# Patient Record
Sex: Male | Born: 1945 | Race: White | Hispanic: No | Marital: Married | Smoking: Never smoker
Health system: Southern US, Community
[De-identification: ages and names within clinical notes are randomized; demographics above are authoritative.]

## PROBLEM LIST (undated history)

## (undated) DIAGNOSIS — M1990 Unspecified osteoarthritis, unspecified site: Secondary | ICD-10-CM

## (undated) DIAGNOSIS — M545 Low back pain: Secondary | ICD-10-CM

## (undated) DIAGNOSIS — J309 Allergic rhinitis, unspecified: Secondary | ICD-10-CM

## (undated) DIAGNOSIS — E785 Hyperlipidemia, unspecified: Secondary | ICD-10-CM

## (undated) DIAGNOSIS — I509 Heart failure, unspecified: Secondary | ICD-10-CM

## (undated) DIAGNOSIS — G4700 Insomnia, unspecified: Secondary | ICD-10-CM

## (undated) DIAGNOSIS — H6980 Other specified disorders of Eustachian tube, unspecified ear: Secondary | ICD-10-CM

## (undated) DIAGNOSIS — I10 Essential (primary) hypertension: Secondary | ICD-10-CM

## (undated) DIAGNOSIS — K279 Peptic ulcer, site unspecified, unspecified as acute or chronic, without hemorrhage or perforation: Secondary | ICD-10-CM

## (undated) DIAGNOSIS — I498 Other specified cardiac arrhythmias: Secondary | ICD-10-CM

## (undated) DIAGNOSIS — K219 Gastro-esophageal reflux disease without esophagitis: Secondary | ICD-10-CM

## (undated) DIAGNOSIS — K5730 Diverticulosis of large intestine without perforation or abscess without bleeding: Secondary | ICD-10-CM

## (undated) DIAGNOSIS — I729 Aneurysm of unspecified site: Secondary | ICD-10-CM

## (undated) HISTORY — DX: Hyperlipidemia, unspecified: E78.5

## (undated) HISTORY — DX: Low back pain: M54.5

## (undated) HISTORY — DX: Other specified cardiac arrhythmias: I49.8

## (undated) HISTORY — DX: Gastro-esophageal reflux disease without esophagitis: K21.9

## (undated) HISTORY — DX: Diverticulosis of large intestine without perforation or abscess without bleeding: K57.30

## (undated) HISTORY — DX: Aneurysm of unspecified site: I72.9

## (undated) HISTORY — DX: Allergic rhinitis, unspecified: J30.9

## (undated) HISTORY — DX: Essential (primary) hypertension: I10

## (undated) HISTORY — DX: Peptic ulcer, site unspecified, unspecified as acute or chronic, without hemorrhage or perforation: K27.9

## (undated) HISTORY — DX: Other specified disorders of Eustachian tube, unspecified ear: H69.80

## (undated) HISTORY — DX: Insomnia, unspecified: G47.00

---

## 1980-01-27 HISTORY — PX: APPENDECTOMY: SHX54

## 1990-01-26 HISTORY — PX: OTHER SURGICAL HISTORY: SHX169

## 2002-06-18 ENCOUNTER — Emergency Department (HOSPITAL_COMMUNITY): Admission: EM | Admit: 2002-06-18 | Discharge: 2002-06-18 | Payer: Self-pay | Admitting: Emergency Medicine

## 2002-06-30 ENCOUNTER — Emergency Department (HOSPITAL_COMMUNITY): Admission: EM | Admit: 2002-06-30 | Discharge: 2002-06-30 | Payer: Self-pay | Admitting: Emergency Medicine

## 2005-12-25 ENCOUNTER — Ambulatory Visit (HOSPITAL_COMMUNITY): Admission: RE | Admit: 2005-12-25 | Discharge: 2005-12-26 | Payer: Self-pay | Admitting: Neurosurgery

## 2005-12-26 HISTORY — PX: LUMBAR DISC SURGERY: SHX700

## 2007-02-02 ENCOUNTER — Ambulatory Visit: Payer: Self-pay | Admitting: Pulmonary Disease

## 2007-02-02 DIAGNOSIS — I10 Essential (primary) hypertension: Secondary | ICD-10-CM

## 2007-02-02 DIAGNOSIS — G4700 Insomnia, unspecified: Secondary | ICD-10-CM | POA: Insufficient documentation

## 2007-02-02 DIAGNOSIS — E785 Hyperlipidemia, unspecified: Secondary | ICD-10-CM | POA: Insufficient documentation

## 2007-02-02 DIAGNOSIS — I729 Aneurysm of unspecified site: Secondary | ICD-10-CM

## 2007-02-02 HISTORY — DX: Insomnia, unspecified: G47.00

## 2007-02-02 HISTORY — DX: Essential (primary) hypertension: I10

## 2007-02-02 HISTORY — DX: Aneurysm of unspecified site: I72.9

## 2007-02-02 HISTORY — DX: Hyperlipidemia, unspecified: E78.5

## 2007-02-23 ENCOUNTER — Ambulatory Visit: Payer: Self-pay | Admitting: Pulmonary Disease

## 2007-07-05 ENCOUNTER — Ambulatory Visit: Payer: Self-pay | Admitting: Internal Medicine

## 2007-07-05 DIAGNOSIS — K5730 Diverticulosis of large intestine without perforation or abscess without bleeding: Secondary | ICD-10-CM | POA: Insufficient documentation

## 2007-07-05 DIAGNOSIS — K219 Gastro-esophageal reflux disease without esophagitis: Secondary | ICD-10-CM

## 2007-07-05 HISTORY — DX: Diverticulosis of large intestine without perforation or abscess without bleeding: K57.30

## 2007-07-05 HISTORY — DX: Gastro-esophageal reflux disease without esophagitis: K21.9

## 2007-07-07 ENCOUNTER — Encounter: Payer: Self-pay | Admitting: Internal Medicine

## 2007-10-04 ENCOUNTER — Telehealth (INDEPENDENT_AMBULATORY_CARE_PROVIDER_SITE_OTHER): Payer: Self-pay | Admitting: *Deleted

## 2007-10-06 ENCOUNTER — Ambulatory Visit: Payer: Self-pay | Admitting: Internal Medicine

## 2007-10-06 DIAGNOSIS — R001 Bradycardia, unspecified: Secondary | ICD-10-CM | POA: Insufficient documentation

## 2007-10-06 DIAGNOSIS — H6693 Otitis media, unspecified, bilateral: Secondary | ICD-10-CM | POA: Insufficient documentation

## 2007-10-06 DIAGNOSIS — I498 Other specified cardiac arrhythmias: Secondary | ICD-10-CM

## 2007-10-06 HISTORY — DX: Other specified cardiac arrhythmias: I49.8

## 2007-10-06 LAB — CONVERTED CEMR LAB
ALT: 37 units/L (ref 0–53)
AST: 31 units/L (ref 0–37)
Albumin: 4.1 g/dL (ref 3.5–5.2)
Alkaline Phosphatase: 89 units/L (ref 39–117)
Cholesterol: 188 mg/dL (ref 0–200)
HDL: 34.3 mg/dL — ABNORMAL LOW (ref >39.0)
Total CHOL/HDL Ratio: 5.5
Total Protein: 7.3 g/dL (ref 6.0–8.3)
Triglycerides: 110 mg/dL (ref 0–149)

## 2008-01-11 ENCOUNTER — Ambulatory Visit: Payer: Self-pay | Admitting: Internal Medicine

## 2008-01-11 DIAGNOSIS — J309 Allergic rhinitis, unspecified: Secondary | ICD-10-CM

## 2008-01-11 DIAGNOSIS — J019 Acute sinusitis, unspecified: Secondary | ICD-10-CM | POA: Insufficient documentation

## 2008-01-11 DIAGNOSIS — M545 Low back pain, unspecified: Secondary | ICD-10-CM

## 2008-01-11 HISTORY — DX: Low back pain, unspecified: M54.50

## 2008-01-11 HISTORY — DX: Allergic rhinitis, unspecified: J30.9

## 2008-07-06 ENCOUNTER — Ambulatory Visit: Payer: Self-pay | Admitting: Internal Medicine

## 2008-07-06 LAB — CONVERTED CEMR LAB
ALT: 32 units/L (ref 0–53)
AST: 31 units/L (ref 0–37)
Albumin: 4 g/dL (ref 3.5–5.2)
Basophils Absolute: 0 10*3/uL (ref 0.0–0.1)
Bilirubin Urine: NEGATIVE
Calcium: 9.4 mg/dL (ref 8.4–10.5)
Cholesterol: 179 mg/dL (ref 0–200)
Creatinine, Ser: 1.2 mg/dL (ref 0.4–1.5)
GFR calc non Af Amer: 65.14 mL/min (ref >60)
HCT: 44.4 % (ref 39.0–52.0)
HDL: 46.4 mg/dL (ref >39.00)
Hemoglobin: 15.2 g/dL (ref 13.0–17.0)
Ketones, ur: NEGATIVE mg/dL
Leukocytes, UA: NEGATIVE
Lymphs Abs: 1.6 10*3/uL (ref 0.7–4.0)
MCV: 88.7 fL (ref 78.0–100.0)
Monocytes Absolute: 0.7 10*3/uL (ref 0.1–1.0)
Neutro Abs: 3.5 10*3/uL (ref 1.4–7.7)
Nitrite: NEGATIVE
Platelets: 197 10*3/uL (ref 150.0–400.0)
RDW: 13.1 % (ref 11.5–14.6)
Sodium: 143 meq/L (ref 135–145)
TSH: 3.87 microintl units/mL (ref 0.35–5.50)
Total Bilirubin: 0.9 mg/dL (ref 0.3–1.2)
Triglycerides: 49 mg/dL (ref 0.0–149.0)
Urobilinogen, UA: 0.2 (ref 0.0–1.0)
pH: 5.5 (ref 5.0–8.0)

## 2008-07-09 ENCOUNTER — Ambulatory Visit: Payer: Self-pay | Admitting: Internal Medicine

## 2008-07-23 ENCOUNTER — Telehealth: Payer: Self-pay | Admitting: Internal Medicine

## 2008-07-24 ENCOUNTER — Ambulatory Visit: Payer: Self-pay | Admitting: Internal Medicine

## 2008-09-08 ENCOUNTER — Ambulatory Visit: Payer: Self-pay | Admitting: Family Medicine

## 2008-10-15 ENCOUNTER — Ambulatory Visit: Payer: Self-pay | Admitting: Internal Medicine

## 2008-11-28 ENCOUNTER — Ambulatory Visit: Payer: Self-pay | Admitting: Internal Medicine

## 2008-11-28 DIAGNOSIS — R109 Unspecified abdominal pain: Secondary | ICD-10-CM | POA: Insufficient documentation

## 2008-11-28 LAB — CONVERTED CEMR LAB
Bilirubin Urine: NEGATIVE
Ketones, ur: NEGATIVE mg/dL
Total Protein, Urine: NEGATIVE mg/dL
Urine Glucose: NEGATIVE mg/dL
Urobilinogen, UA: 0.2 (ref 0.0–1.0)

## 2008-11-30 ENCOUNTER — Telehealth: Payer: Self-pay | Admitting: Internal Medicine

## 2008-12-26 ENCOUNTER — Ambulatory Visit: Payer: Self-pay | Admitting: Internal Medicine

## 2008-12-26 LAB — CONVERTED CEMR LAB
AST: 27 units/L (ref 0–37)
Albumin: 3.9 g/dL (ref 3.5–5.2)
HDL: 45 mg/dL (ref >39.00)
LDL Cholesterol: 117 mg/dL — ABNORMAL HIGH (ref 0–99)
Total Bilirubin: 0.8 mg/dL (ref 0.3–1.2)
Total CHOL/HDL Ratio: 4
Triglycerides: 137 mg/dL (ref 0.0–149.0)
VLDL: 27.4 mg/dL (ref 0.0–40.0)

## 2009-01-24 ENCOUNTER — Ambulatory Visit: Payer: Self-pay | Admitting: Internal Medicine

## 2009-03-27 ENCOUNTER — Ambulatory Visit: Payer: Self-pay | Admitting: Internal Medicine

## 2009-03-27 DIAGNOSIS — M542 Cervicalgia: Secondary | ICD-10-CM | POA: Insufficient documentation

## 2009-04-06 ENCOUNTER — Ambulatory Visit: Payer: Self-pay | Admitting: Family Medicine

## 2009-04-06 DIAGNOSIS — H6980 Other specified disorders of Eustachian tube, unspecified ear: Secondary | ICD-10-CM | POA: Insufficient documentation

## 2009-04-06 DIAGNOSIS — H6990 Unspecified Eustachian tube disorder, unspecified ear: Secondary | ICD-10-CM

## 2009-04-06 DIAGNOSIS — H6981 Other specified disorders of Eustachian tube, right ear: Secondary | ICD-10-CM | POA: Insufficient documentation

## 2009-04-06 HISTORY — DX: Unspecified eustachian tube disorder, unspecified ear: H69.90

## 2009-04-06 HISTORY — DX: Other specified disorders of Eustachian tube, unspecified ear: H69.80

## 2009-04-08 ENCOUNTER — Telehealth: Payer: Self-pay | Admitting: Internal Medicine

## 2009-07-18 ENCOUNTER — Ambulatory Visit: Payer: Self-pay | Admitting: Internal Medicine

## 2009-07-18 LAB — CONVERTED CEMR LAB
ALT: 28 units/L (ref 0–53)
Basophils Absolute: 0 10*3/uL (ref 0.0–0.1)
Basophils Relative: 0.7 % (ref 0.0–3.0)
Bilirubin Urine: NEGATIVE
Bilirubin, Direct: 0.1 mg/dL (ref 0.0–0.3)
CO2: 30 meq/L (ref 19–32)
Calcium: 10 mg/dL (ref 8.4–10.5)
Chloride: 105 meq/L (ref 96–112)
Eosinophils Absolute: 0.1 10*3/uL (ref 0.0–0.7)
Glucose, Bld: 84 mg/dL (ref 70–99)
HCT: 45.7 % (ref 39.0–52.0)
Hemoglobin: 15.8 g/dL (ref 13.0–17.0)
Ketones, ur: NEGATIVE mg/dL
LDL Cholesterol: 105 mg/dL — ABNORMAL HIGH (ref 0–99)
Lymphocytes Relative: 30.9 % (ref 12.0–46.0)
Lymphs Abs: 2.2 10*3/uL (ref 0.7–4.0)
MCHC: 34.7 g/dL (ref 30.0–36.0)
Neutro Abs: 3.8 10*3/uL (ref 1.4–7.7)
Potassium: 4.9 meq/L (ref 3.5–5.1)
RBC: 5.2 M/uL (ref 4.22–5.81)
Sodium: 142 meq/L (ref 135–145)
Total Bilirubin: 0.9 mg/dL (ref 0.3–1.2)
Total CHOL/HDL Ratio: 4
Total Protein, Urine: NEGATIVE mg/dL
Triglycerides: 96 mg/dL (ref 0.0–149.0)
Urine Glucose: NEGATIVE mg/dL
VLDL: 19.2 mg/dL (ref 0.0–40.0)
pH: 6 (ref 5.0–8.0)

## 2009-09-20 ENCOUNTER — Ambulatory Visit: Payer: Self-pay | Admitting: Internal Medicine

## 2010-02-23 LAB — CONVERTED CEMR LAB
AST: 27 units/L (ref 0–37)
Basophils Absolute: 0.1 10*3/uL (ref 0.0–0.1)
Basophils Relative: 1 % (ref 0.0–1.0)
Chloride: 106 meq/L (ref 96–112)
Cholesterol: 194 mg/dL (ref 0–200)
Creatinine, Ser: 1.1 mg/dL (ref 0.4–1.5)
Eosinophils Absolute: 0.1 10*3/uL (ref 0.0–0.7)
GFR calc Af Amer: 88 mL/min
GFR calc non Af Amer: 72 mL/min
HDL: 40 mg/dL (ref >39.0)
Ketones, ur: NEGATIVE mg/dL
LDL Cholesterol: 134 mg/dL — ABNORMAL HIGH (ref 0–99)
MCHC: 34.6 g/dL (ref 30.0–36.0)
MCV: 87.4 fL (ref 78.0–100.0)
Monocytes Absolute: 0.7 10*3/uL (ref 0.1–1.0)
Neutrophils Relative %: 49.9 % (ref 43.0–77.0)
PSA: 1 ng/mL (ref 0.10–4.00)
Platelets: 199 10*3/uL (ref 150–400)
RBC: 5.17 M/uL (ref 4.22–5.81)
TSH: 2.98 microintl units/mL (ref 0.35–5.50)
Total Bilirubin: 0.7 mg/dL (ref 0.3–1.2)
Triglycerides: 99 mg/dL (ref 0–149)
Urine Glucose: NEGATIVE mg/dL
Urobilinogen, UA: 0.2 (ref 0.0–1.0)
VLDL: 20 mg/dL (ref 0–40)

## 2010-02-25 NOTE — Assessment & Plan Note (Signed)
Summary: RIGHT EAR PAIN X 1 WK...AS.   Vital Signs:  Patient profile:   65 year old male Weight:      198 pounds Temp:     97.8 degrees F oral BP sitting:   102 / 76  (left arm)  Vitals Entered By: Doristine Devoid (April 06, 2009 10:48 AM) CC: R ear pain   History of Present Illness: R ear and throat hurt- rad to neck  going on about a week some sweats at night and chills  some nasal running and stuffiness- no colored d/c  no cough   no rash  no stomach upset   other ear feels fine   no meds over the counter   some facial pain over R eye   Allergies: 1)  ! Codeine  Past History:  Past Medical History: Last updated: 01/11/2008 Hyperlipidemia Hypertension GERD Diverticulosis, colon chronic bradycardia chronic right foot drop Low back pain/lumbar disc disease DJD hx of sciatica Allergic rhinitis  Past Surgical History: Last updated: 01/11/2008 appendectomy 1982 s/p SAH left brain 1992 - [facility] - due to brain aneurysm s/p repair s/p lumbar disc surgury dr Venetia Maxon - 12/07 s/p renal surgury for football injury 1966 Inguinal herniorrhaphy - right  Family History: Last updated: 01/11/2008 heart disease: father, HTN 3 brother with HTN 1 sister died with heart disease  Social History: Last updated: 07/05/2007 Patient never smoked.  pt is married. pt has children. 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 Eyes:  Complains of eye irritation. ENT:  Complains of earache, nasal congestion, postnasal drainage, and sore throat; denies ear discharge. CV:  Denies chest pain or discomfort and palpitations. Resp:  Complains of cough; denies sputum productive and wheezing. GI:  Denies diarrhea, nausea, and vomiting. Derm:  Denies rash.  Physical Exam  General:   Well-developed,well-nourished,in no acute distress; alert,appropriate and cooperative throughout examination Head:  normocephalic, atraumatic, and no abnormalities observed.  no sinus or temporal tenderness  Eyes:  vision grossly intact, pupils equal, pupils round, and pupils reactive to light.   Ears:  dull TMs , with eff on the R side  Nose:  nares are injected and congested bilaterally  Mouth:  throat clear with some clear post nasal drip Neck:  No deformities, masses, or tenderness noted. Lungs:  Normal respiratory effort, chest expands symmetrically. Lungs are clear to auscultation, no crackles or wheezes. Heart:  normal rate and regular rhythm.   Skin:  Intact without suspicious lesions or rashes Cervical Nodes:  No lymphadenopathy noted Psych:  normal affect, talkative and pleasant    Impression & Recommendations:  Problem # 1:  EUSTACHIAN TUBE DYSFUNCTION (ICD-381.81) Assessment New s/p uri with some congestion and mild st on one side  will tx with nasonex two times a day for 3 d and then once daily  afrin for upcoming airplane flight  given px for amox in case of worse ear pain or fever while out of town  Complete Medication List: 1)  Amlodipine Besylate 5 Mg Tabs (Amlodipine besylate) .Marland Kitchen.. 1po once daily 2)  Simvastatin 40 Mg Tabs (Simvastatin) .Marland Kitchen.. 1 by mouth once daily 3)  Prilosec 20 Mg Cpdr (Omeprazole) .... Take 1 tablet by mouth once a day 4)  Bayer Childrens Aspirin 81 Mg Chew (Aspirin) .... Take 1 tablet by mouth once a day 5)  Trazodone Hcl  50 Mg Tabs (Trazodone hcl) .Marland Kitchen.. 1 by mouth at bedtime as needed 6)  Atenolol 25 Mg Tabs (Atenolol) .Marland Kitchen.. 1 by mouth once daily 7)  Nasonex 50 Mcg/act Susp (Mometasone furoate) .... 2 spray/side once daily 8)  Ceftin 250 Mg Tabs (Cefuroxime axetil) .Marland Kitchen.. 1 by mouth two times a day 9)  Oxycodone Hcl 5 Mg Tabs (Oxycodone hcl) .Marland Kitchen.. 1 -2 by mouth q 6 hrs as needed pain 10)  Flexeril 5 Mg Tabs (Cyclobenzaprine hcl) .Marland Kitchen.. 1 by mouth three  times a day as needed 11)  Medrol (pak) 4 Mg Tabs (Methylprednisolone) .... Use asd by mouth 12)  Amoxicillin 500 Mg Caps (Amoxicillin) .Marland Kitchen.. 1 by mouth three times a day for 10 days so start if worse ear pain or fever  Patient Instructions: 1)  use nasonex 2 sprays in each nostril two times a day for 3 days and then just once daily for at least 10 days  2)  use afrin as directed one time 30 minutes before take off of plane -- going and coming  3)  if worse ear pain or fever fill and take amox and update [place] if not improving  Prescriptions: AMOXICILLIN 500 MG CAPS (AMOXICILLIN) 1 by mouth three times a day for 10 days so start if worse ear pain or fever  #30 x 0   Entered and Authorized by:   Judith Part MD   Signed by:   Judith Part MD on 04/06/2009   Method used:   Print then Give to Patient   RxID:   [id number]

## 2010-02-25 NOTE — Assessment & Plan Note (Signed)
Summary: 1 YR FU $50  STC   Vital Signs:  Patient profile:   65 year old male Height:      72 inches Weight:      199 pounds BMI:     27.09 O2 Sat:      97 % on Room air Temp:     97.9 degrees F oral Pulse rate:   44 / minute BP sitting:   108 / 60  (left arm) Cuff size:   regular  Vitals Entered By: Zella Ball Ewing CMA Duncan Dull) (July 18, 2009 2:04 PM)  O2 Flow:  Room air  CC: Followup/RE   Primary Care Provider:  Corwin Levins MD  CC:  Followup/RE.  History of Present Illness: overall doing well,  no specific complaints;  Pt denies CP, sob, doe, wheezing, orthopnea, pnd, worsening LE edema, palps, dizziness or syncope  Pt denies new neuro symptoms such as headache, facial or extremity weakness   Problems Prior to Update: 1)  Eustachian Tube Dysfunction  (ICD-381.81) 2)  Neck Pain  (ICD-723.1) 3)  Back Pain, Lumbar  (ICD-724.2) 4)  Abdominal Pain Other Specified Site  (ICD-789.09) 5)  Sinusitis- Acute-nos  (ICD-461.9) 6)  Allergic Rhinitis  (ICD-477.9) 7)  Low Back Pain  (ICD-724.2) 8)  Sinusitis- Acute-nos  (ICD-461.9) 9)  Hepatotoxicity, Drug-induced, Risk of  (ICD-V58.69) 10)  Otitis Media, Left  (ICD-382.9) 11)  Bradycardia, Chronic  (ICD-427.89) 12)  Diverticulosis, Colon  (ICD-562.10) 13)  Preventive Health Care  (ICD-V70.0) 14)  Gerd  (ICD-530.81) 15)  Persistent Disorder Initiating/maintaining Sleep  (ICD-307.42) 16)  Hx of Aneurysm  (ICD-442.9) 17)  Hypertension  (ICD-401.9) 18)  Hyperlipidemia  (ICD-272.4)  Medications Prior to Update: 1)  Amlodipine Besylate 5 Mg Tabs (Amlodipine Besylate) .Marland Kitchen.. 1po Once Daily 2)  Simvastatin 40 Mg Tabs (Simvastatin) .Marland Kitchen.. 1 By Mouth Once Daily 3)  Prilosec 20 Mg  Cpdr (Omeprazole) .... Take 1 Tablet By Mouth Once A Day 4)  Bayer Childrens Aspirin 81 Mg  Chew (Aspirin) .... Take 1 Tablet By Mouth Once A Day 5)  Trazodone Hcl 50 Mg  Tabs (Trazodone Hcl) .Marland Kitchen.. 1 By Mouth At Bedtime As Needed 6)  Atenolol 25 Mg Tabs (Atenolol)  .Marland Kitchen.. 1 By Mouth Once Daily 7)  Nasonex 50 Mcg/act Susp (Mometasone Furoate) .... 2 Spray/side Once Daily 8)  Ceftin 250 Mg Tabs (Cefuroxime Axetil) .Marland Kitchen.. 1 By Mouth Two Times A Day 9)  Oxycodone Hcl 5 Mg Tabs (Oxycodone Hcl) .Marland Kitchen.. 1 -2 By Mouth Q 6 Hrs As Needed Pain 10)  Flexeril 5 Mg Tabs (Cyclobenzaprine Hcl) .Marland Kitchen.. 1 By Mouth Three Times A Day As Needed 11)  Medrol (Pak) 4 Mg Tabs (Methylprednisolone) .... Use Asd By Mouth 12)  Amoxicillin 500 Mg Caps (Amoxicillin) .Marland Kitchen.. 1 By Mouth Three Times A Day For 10 Days So Start If Worse Ear Pain or Fever  Current Medications (verified): 1)  Amlodipine Besylate 5 Mg Tabs (Amlodipine Besylate) .Marland Kitchen.. 1po Once Daily 2)  Simvastatin 40 Mg Tabs (Simvastatin) .Marland Kitchen.. 1 By Mouth Once Daily 3)  Prilosec 20 Mg  Cpdr (Omeprazole) .... Take 1 Tablet By Mouth Once A Day 4)  Bayer Childrens Aspirin 81 Mg  Chew (Aspirin) .... Take 1 Tablet By Mouth Once A Day 5)  Trazodone Hcl 50 Mg  Tabs (Trazodone Hcl) .Marland Kitchen.. 1 By Mouth At Bedtime As Needed 6)  Atenolol 25 Mg Tabs (Atenolol) .Marland Kitchen.. 1 By Mouth Once Daily 7)  Nasonex 50 Mcg/act Susp (Mometasone Furoate) .... 2 Spray/side Once  Daily  Allergies (verified): 1)  ! Codeine  Past History:  Past Medical History: Last updated: 01/11/2008 Hyperlipidemia Hypertension GERD Diverticulosis, colon chronic bradycardia chronic right foot drop Low back pain/lumbar disc disease DJD hx of sciatica Allergic rhinitis  Past Surgical History: Last updated: 01/11/2008 appendectomy 1982 s/p SAH left brain 1992 - [facility] - due to brain aneurysm s/p repair s/p lumbar disc surgury dr Venetia Maxon - 12/07 s/p renal surgury for football injury 1966 Inguinal herniorrhaphy - right  Family History: Last updated: 01/11/2008 heart disease: father, HTN 3 brother with HTN 1 sister died with heart disease  Social History: Last updated: 07/05/2007 Patient never smoked.  pt is married. pt has children. 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         all otherwise negative per pt -    Physical Exam  General:  alert and overweight-appearing.   Head:  normocephalic and atraumatic.   Eyes:  vision grossly intact, pupils equal, and pupils round.   Ears:  R ear normal and L ear normal.   Nose:  no external deformity and no nasal discharge.   Mouth:  no gingival abnormalities and pharynx pink and moist.   Neck:  supple and no masses.   Lungs:  normal respiratory effort and normal breath sounds.   Heart:  normal rate and regular rhythm.   Abdomen:  soft, non-tender, and normal bowel sounds.   Msk:  no joint tenderness and no joint swelling.   Extremities:  no edema, no erythema  Neurologic:  cranial nerves II-XII intact and strength normal in all extremities.     Impression & Recommendations:  Problem # 1:  Preventive Health Care (ICD-V70.0)  Overall doing well, age appropriate education and counseling updated and referral for appropriate preventive services done unless declined, immunizations up to date or declined, diet counseling done if overweight, urged to quit smoking if smokes , most recent labs reviewed and current ordered if appropriate, ecg reviewed or declined (interpretation per ECG scanned in the EMR if done); information regarding Medicare Prevention requirements given if appropriate; speciality referrals updated as appropriate    Orders: TLB-BMP (Basic Metabolic Panel-BMET) (80048-METABOL) TLB-CBC Platelet - w/Differential (85025-CBCD) TLB-Hepatic/Liver Function Pnl (80076-HEPATIC) TLB-Lipid Panel (80061-LIPID) TLB-TSH (Thyroid Stimulating Hormone) (84443-TSH) TLB-PSA (Prostate Specific Antigen) (84153-PSA) TLB-Udip ONLY (81003-UDIP)  Problem # 2:  HYPERTENSION (ICD-401.9)  His updated medication list for this problem includes:    Amlodipine Besylate 5 Mg Tabs (Amlodipine besylate) .Marland Kitchen... 1po once daily    Atenolol 25 Mg Tabs (Atenolol) .Marland Kitchen... 1 by mouth once daily  BP today: 108/60 Prior BP: 102/76 (04/06/2009)  Labs Reviewed: K+: 4.6 (07/06/2008) Creat: : 1.2 (07/06/2008)   Chol: 189 (12/26/2008)   HDL: 45.00 (12/26/2008)   LDL: 117 (12/26/2008)   TG: 137.0 (12/26/2008) stable overall by hx and exam, ok to continue meds/tx as is   Complete Medication List: 1)  Amlodipine Besylate 5 Mg Tabs (Amlodipine besylate) .Marland Kitchen.. 1po once daily 2)  Simvastatin 40 Mg Tabs (Simvastatin) .Marland Kitchen.. 1 by mouth once daily 3)  Prilosec 20 Mg  Cpdr (Omeprazole) .... Take 1 tablet by mouth once a day 4)  Bayer Childrens Aspirin 81 Mg Chew (Aspirin) .... Take 1 tablet by mouth once a day 5)  Trazodone Hcl 50 Mg Tabs (Trazodone hcl) .Marland Kitchen.. 1 by mouth at bedtime as needed 6)  Atenolol 25 Mg Tabs (Atenolol) .Marland Kitchen.. 1 by mouth once daily 7)  Nasonex 50 Mcg/act Susp (Mometasone furoate) .... 2 spray/side once daily  Patient Instructions: 1)  Please go to the Lab in the basement for your blood and/or urine tests today 2)  Continue all previous medications as before this visit  3)  Please schedule a follow-up appointment in 1 year or sooner if needed

## 2010-02-25 NOTE — Assessment & Plan Note (Signed)
Summary: low back pain./cd   Vital Signs:  Patient profile:   65 year old male Height:      72 inches Weight:      198.50 pounds BMI:     27.02 O2 Sat:      97 % on Room air Temp:     98.3 degrees F oral Pulse rate:   52 / minute BP sitting:   122 / 72  (left arm) Cuff size:   regular  Vitals Entered ByZella Ball Ewing (March 27, 2009 3:45 PM)  O2 Flow:  Room air CC: Low back and neck pain/RE   Primary Care Provider:  Corwin Levins MD  CC:  Low back and neck pain/RE.  History of Present Illness: here with acute onset lower back pain, now mod to severe, located across the lower back more to the left than right somewhat, but non radicular like his sciatica in the past;  and not assoc with new LE pain, weakness, numbness  and no bowel or bladder changes;  no fever, wt loss, night sweats or other constitutional symtpoms.  Pt denies CP, sob, doe, wheezing, orthopnea, pnd, worsening LE edema, palps, dizziness or syncope   Pt denies new neuro symptoms such as headache, facial or extremity weakness Also has some aching to both sides of the post neck but very mild, just sore and achy and no radicular symptoms as well.    Problems Prior to Update: 1)  Back Pain, Lumbar  (ICD-724.2) 2)  Abdominal Pain Other Specified Site  (ICD-789.09) 3)  Sinusitis- Acute-nos  (ICD-461.9) 4)  Allergic Rhinitis  (ICD-477.9) 5)  Low Back Pain  (ICD-724.2) 6)  Sinusitis- Acute-nos  (ICD-461.9) 7)  Hepatotoxicity, Drug-induced, Risk of  (ICD-V58.69) 8)  Otitis Media, Left  (ICD-382.9) 9)  Bradycardia, Chronic  (ICD-427.89) 10)  Diverticulosis, Colon  (ICD-562.10) 11)  Preventive Health Care  (ICD-V70.0) 12)  Gerd  (ICD-530.81) 13)  Persistent Disorder Initiating/maintaining Sleep  (ICD-307.42) 14)  Hx of Aneurysm  (ICD-442.9) 15)  Hypertension  (ICD-401.9) 16)  Hyperlipidemia  (ICD-272.4)  Medications Prior to Update: 1)  Amlodipine Besylate 5 Mg Tabs (Amlodipine Besylate) .Marland Kitchen.. 1po Once Daily 2)   Simvastatin 40 Mg Tabs (Simvastatin) .Marland Kitchen.. 1 By Mouth Once Daily 3)  Prilosec 20 Mg  Cpdr (Omeprazole) .... Take 1 Tablet By Mouth Once A Day 4)  Bayer Childrens Aspirin 81 Mg  Chew (Aspirin) .... Take 1 Tablet By Mouth Once A Day 5)  Trazodone Hcl 50 Mg  Tabs (Trazodone Hcl) .Marland Kitchen.. 1 By Mouth At Bedtime As Needed 6)  Atenolol 25 Mg Tabs (Atenolol) .Marland Kitchen.. 1 By Mouth Once Daily 7)  Nasonex 50 Mcg/act Susp (Mometasone Furoate) .... 2 Spray/side Once Daily 8)  Ceftin 250 Mg Tabs (Cefuroxime Axetil) .Marland Kitchen.. 1 By Mouth Two Times A Day 9)  Hydrocodone-Homatropine 5-1.5 Mg/37ml Syrp (Hydrocodone-Homatropine) .Marland Kitchen.. 1 Tsp By Mouth Q 6 Hrs As Needed Cough  Current Medications (verified): 1)  Amlodipine Besylate 5 Mg Tabs (Amlodipine Besylate) .Marland Kitchen.. 1po Once Daily 2)  Simvastatin 40 Mg Tabs (Simvastatin) .Marland Kitchen.. 1 By Mouth Once Daily 3)  Prilosec 20 Mg  Cpdr (Omeprazole) .... Take 1 Tablet By Mouth Once A Day 4)  Bayer Childrens Aspirin 81 Mg  Chew (Aspirin) .... Take 1 Tablet By Mouth Once A Day 5)  Trazodone Hcl 50 Mg  Tabs (Trazodone Hcl) .Marland Kitchen.. 1 By Mouth At Bedtime As Needed 6)  Atenolol 25 Mg Tabs (Atenolol) .Marland Kitchen.. 1 By Mouth Once Daily 7)  Nasonex 50 Mcg/act Susp (Mometasone Furoate) .... 2 Spray/side Once Daily 8)  Ceftin 250 Mg Tabs (Cefuroxime Axetil) .Marland Kitchen.. 1 By Mouth Two Times A Day 9)  Oxycodone Hcl 5 Mg Tabs (Oxycodone Hcl) .Marland Kitchen.. 1 -2 By Mouth Q 6 Hrs As Needed Pain 10)  Flexeril 5 Mg Tabs (Cyclobenzaprine Hcl) .Marland Kitchen.. 1 By Mouth Three Times A Day As Needed 11)  Medrol (Pak) 4 Mg Tabs (Methylprednisolone) .... Use Asd By Mouth  Allergies (verified): 1)  ! Codeine  Past History:  Past Medical History: Last updated: 01/11/2008 Hyperlipidemia Hypertension GERD Diverticulosis, colon chronic bradycardia chronic right foot drop Low back pain/lumbar disc disease DJD hx of sciatica Allergic rhinitis  Past Surgical History: Last updated: 01/11/2008 appendectomy 1982 s/p SAH left brain 1992 - [facility] -  due to brain aneurysm s/p repair s/p lumbar disc surgury dr Venetia Maxon - 12/07 s/p renal surgury for football injury 1966 Inguinal herniorrhaphy - right  Social History: Last updated: 07/05/2007 Patient never smoked.  pt is married. pt has children. 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   Head:  normocephalic and atraumatic.   Eyes:  vision grossly intact, pupils equal, and pupils round.   Ears:  R ear normal and L ear normal.   Nose:  no external deformity and no nasal discharge.   Mouth:  no gingival abnormalities and pharynx pink and moist.   Neck:  supple and no masses.   Lungs:  normal respiratory effort and normal breath sounds.   Heart:  normal rate and regular rhythm.   Abdomen:  soft, non-tender, and normal bowel sounds.   Msk:  mild tender bilat paracervical without swelling , erythema or rash;  spine nontender throughout;  has lumbar bilat paravertebral tender left more than right Extremities:  no edema, no erythema  Neurologic:  diminshed reflex patellar and s1 1+, 2+ on the left, has chronic right foot drop as well - o/w no change in LE strength, sensation and DTR's   Impression & Recommendations:  Problem # 1:  BACK PAIN, LUMBAR (ICD-724.2)  His updated medication list for this problem includes:    Bayer Childrens Aspirin 81 Mg Chew (Aspirin) .Marland Kitchen... Take 1 tablet by mouth once a day    Oxycodone Hcl 5 Mg Tabs (Oxycodone hcl) .Marland Kitchen... 1 -2 by mouth q 6 hrs as needed pain    Flexeril 5 Mg Tabs (Cyclobenzaprine hcl) .Marland Kitchen... 1 by mouth three times a day as needed by hx and exam c/w prob DJD/DDD flare - no need for films at this time; treat as above, f/u any worsening signs or symptoms , consider MRI if persists or worsens, consider ortho - pt has had  epidurals in the past  Problem # 2:  NECK PAIN (ICD-723.1)  His updated medication list for this problem includes:    Bayer Childrens Aspirin 81 Mg Chew (Aspirin) .Marland Kitchen... Take 1 tablet by mouth once a day    Oxycodone Hcl 5 Mg Tabs (Oxycodone hcl) .Marland Kitchen... 1 -2 by mouth q 6 hrs as needed pain    Flexeril 5 Mg Tabs (Cyclobenzaprine hcl) .Marland Kitchen... 1 by mouth three times a day as needed treat as above, f/u any worsening signs or symptoms, mild only c/w strain, ok to follow, doubt need for furhter  eval and tx unless worsened s/s  Problem # 3:  HYPERTENSION (ICD-401.9)  His updated medication list for this problem includes:    Amlodipine Besylate 5 Mg Tabs (Amlodipine besylate) .Marland Kitchen... 1po once daily    Atenolol 25 Mg Tabs (Atenolol) .Marland Kitchen... 1 by mouth once daily  BP today: 122/72 Prior BP: 110/70 (01/24/2009)  Labs Reviewed: K+: 4.6 (07/06/2008) Creat: : 1.2 (07/06/2008)   Chol: 189 (12/26/2008)   HDL: 45.00 (12/26/2008)   LDL: 117 (12/26/2008)   TG: 137.0 (12/26/2008) stable overall by hx and exam, ok to continue meds/tx as is   Complete Medication List: 1)  Amlodipine Besylate 5 Mg Tabs (Amlodipine besylate) .Marland Kitchen.. 1po once daily 2)  Simvastatin 40 Mg Tabs (Simvastatin) .Marland Kitchen.. 1 by mouth once daily 3)  Prilosec 20 Mg Cpdr (Omeprazole) .... Take 1 tablet by mouth once a day 4)  Bayer Childrens Aspirin 81 Mg Chew (Aspirin) .... Take 1 tablet by mouth once a day 5)  Trazodone Hcl 50 Mg Tabs (Trazodone hcl) .Marland Kitchen.. 1 by mouth at bedtime as needed 6)  Atenolol 25 Mg Tabs (Atenolol) .Marland Kitchen.. 1 by mouth once daily 7)  Nasonex 50 Mcg/act Susp (Mometasone furoate) .... 2 spray/side once daily 8)  Ceftin 250 Mg Tabs (Cefuroxime axetil) .Marland Kitchen.. 1 by mouth two times a day 9)  Oxycodone Hcl 5 Mg Tabs (Oxycodone hcl) .Marland Kitchen.. 1 -2 by mouth q 6 hrs as needed pain 10)  Flexeril 5 Mg Tabs (Cyclobenzaprine hcl) .Marland Kitchen.. 1 by mouth three times a day as needed 11)  Medrol (pak) 4 Mg Tabs (Methylprednisolone) .... Use asd by  mouth  Patient Instructions: 1)  Please take all new medications as prescribed  2)  Continue all previous medications as before this visit  3)  Please schedule a follow-up appointment as needed. Prescriptions: MEDROL (PAK) 4 MG TABS (METHYLPREDNISOLONE) use asd by mouth  #1pk x 1   Entered and Authorized by:   Corwin Levins MD   Signed by:   Corwin Levins MD on 03/27/2009   Method used:   Print then Give to Patient   RxID:   [id number] FLEXERIL 5 MG TABS (CYCLOBENZAPRINE HCL) 1 by mouth three times a day as needed  #60 x 0   Entered and Authorized by:   Corwin Levins MD   Signed by:   Corwin Levins MD on 03/27/2009   Method used:   Print then Give to Patient   RxID:   [id number] OXYCODONE HCL 5 MG TABS (OXYCODONE HCL) 1 -2 by mouth q 6 hrs as needed pain  #60 x 0   Entered and Authorized by:   Corwin Levins MD   Signed by:   Corwin Levins MD on 03/27/2009   Method used:   Print then Give to Patient   RxID:   [id number]

## 2010-02-25 NOTE — Progress Notes (Signed)
Summary: Call Report  Phone Note Other Incoming   Caller: [facility] Call Report Summary of Call: [facility] Triage Call Report Triage Record Num: [id number] Operator: Elita Boone Patient Name: Thomas Caldwell Call Date & Time: 04/06/2009 9:36:49AM Patient Phone: [phone] PCP: Oliver Barre Patient Gender: Male PCP Fax : [phone] Patient DOB: 03-May-1945 Practice Name: [facility] Reason for Call: Pt called to report that he has had a earache for about week. Pt reports no drainage. Pt is afebrile. Pt instructed to be seen at [facility]. Home care advice given for earache. Pt transferred to office. No emergent s/s. Protocol(s) Used: Ear - Pain/Injury/Foreign Body Recommended Outcome per Protocol: See Provider within 4 hours Reason for Outcome: Severe pain (sharp, stabbing, throbbing or excruciating aching) unresponsive to 24 hours of home care Care Advice: A warm washcloth or heating pad set on low to the affected ear may help relieve the discomfort. May apply for 15 to 20 minutes, 3 to 4 times a day.  ~ During pregnancy, call provider if temperature is 100 F (37.7 C) or greater OR any temperature elevation for 3 days even while taking acetaminophen.  ~ Resting or sleeping with head elevated, such as semi-reclining in a recliner, may help reduce inner ear pressure and discomfort.  ~ Keep ear canal as dry as possible. Take a bath instead of showering. Try to keep water out of ear when washing hair by placing cotton balls in ear opening. Avoid swimming or water sports until [place] by provider.  ~  ~ SYMPTOM / CONDITION MANAGEMENT  Initial call taken by: Margaret Pyle, CMA,  April 08, 2009 9:15 AM

## 2010-02-25 NOTE — Assessment & Plan Note (Signed)
Summary: left ear pain/cd   Vital Signs:  Patient profile:   65 year old male Height:      72 inches Weight:      198.13 pounds BMI:     26.97 O2 Sat:      95 % on Room air Temp:     97.6 degrees F oral Pulse rate:   46 / minute BP sitting:   112 / 82  (left arm) Cuff size:   regular  Vitals Entered By: Zella Ball Ewing CMA Duncan Dull) (September 20, 2009 11:29 AM)  O2 Flow:  Room air CC: left ear pain/RE   Primary Care Provider:  Corwin Levins MD  CC:  left ear pain/RE.  History of Present Illness: here with acute onset midl to mod left ear ache, with fever, headache on the left side about the ear, reduced hearing mild, and slight vertigo with head movement;  no n/v, chills, ST, cough and Pt denies CP, worsening sob, doe, wheezing, orthopnea, pnd, worsening LE edema, palps, dizziness or syncope  Pt denies new neuro symptoms such as other headache, facial or extremity weakness Overall good compliacne, good tolerability.  This episode is different from march in that he has not just returned from his monthly flight to [place], but did have the grandkids over last weekend.  He is concerned that he will have trouble with the ear as he is flying to [place] in 10 days.  Has popping to the left ear, on top of ongoing sinus allergies with itch and sneeze in the past few wks.    Problems Prior to Update: 1)  Otitis Media, Left  (ICD-382.9) 2)  Eustachian Tube Dysfunction  (ICD-381.81) 3)  Neck Pain  (ICD-723.1) 4)  Back Pain, Lumbar  (ICD-724.2) 5)  Abdominal Pain Other Specified Site  (ICD-789.09) 6)  Sinusitis- Acute-nos  (ICD-461.9) 7)  Allergic Rhinitis  (ICD-477.9) 8)  Low Back Pain  (ICD-724.2) 9)  Sinusitis- Acute-nos  (ICD-461.9) 10)  Hepatotoxicity, Drug-induced, Risk of  (ICD-V58.69) 11)  Otitis Media, Left  (ICD-382.9) 12)  Bradycardia, Chronic  (ICD-427.89) 13)  Diverticulosis, Colon  (ICD-562.10) 14)  Preventive Health Care  (ICD-V70.0) 15)  Gerd  (ICD-530.81) 16)  Persistent Disorder  Initiating/maintaining Sleep  (ICD-307.42) 17)  Hx of Aneurysm  (ICD-442.9) 18)  Hypertension  (ICD-401.9) 19)  Hyperlipidemia  (ICD-272.4)  Medications Prior to Update: 1)  Amlodipine Besylate 5 Mg Tabs (Amlodipine Besylate) .Marland Kitchen.. 1po Once Daily 2)  Simvastatin 40 Mg Tabs (Simvastatin) .Marland Kitchen.. 1 By Mouth Once Daily 3)  Prilosec 20 Mg  Cpdr (Omeprazole) .... Take 1 Tablet By Mouth Once A Day 4)  Bayer Childrens Aspirin 81 Mg  Chew (Aspirin) .... Take 1 Tablet By Mouth Once A Day 5)  Trazodone Hcl 50 Mg  Tabs (Trazodone Hcl) .Marland Kitchen.. 1 By Mouth At Bedtime As Needed 6)  Atenolol 25 Mg Tabs (Atenolol) .Marland Kitchen.. 1 By Mouth Once Daily 7)  Nasonex 50 Mcg/act Susp (Mometasone Furoate) .... 2 Spray/side Once Daily  Current Medications (verified): 1)  Amlodipine Besylate 5 Mg Tabs (Amlodipine Besylate) .Marland Kitchen.. 1po Once Daily 2)  Simvastatin 40 Mg Tabs (Simvastatin) .Marland Kitchen.. 1 By Mouth Once Daily 3)  Prilosec 20 Mg  Cpdr (Omeprazole) .... Take 1 Tablet By Mouth Once A Day 4)  Bayer Childrens Aspirin 81 Mg  Chew (Aspirin) .... Take 1 Tablet By Mouth Once A Day 5)  Trazodone Hcl 50 Mg  Tabs (Trazodone Hcl) .Marland Kitchen.. 1 By Mouth At Bedtime As Needed 6)  Atenolol 25  Mg Tabs (Atenolol) .Marland Kitchen.. 1 By Mouth Once Daily 7)  Nasonex 50 Mcg/act Susp (Mometasone Furoate) .... 2 Spray/side Once Daily 8)  Levocetirizine Dihydrochloride 5 Mg Tabs (Levocetirizine Dihydrochloride) .Marland Kitchen.. 1 By Mouth Once Daily As Needed Allergies 9)  Cephalexin 500 Mg Tabs (Cephalexin) .Marland Kitchen.. 1po Three Times A Day  Allergies (verified): 1)  ! Codeine  Past History:  Past Medical History: Last updated: 01/11/2008 Hyperlipidemia Hypertension GERD Diverticulosis, colon chronic bradycardia chronic right foot drop Low back pain/lumbar disc disease DJD hx of sciatica Allergic rhinitis  Past Surgical History: Last updated: 01/11/2008 appendectomy 1982 s/p SAH left brain 1992 - [facility] - due to brain aneurysm s/p repair s/p lumbar disc surgury dr Venetia Maxon -  12/07 s/p renal surgury for football injury 1966 Inguinal herniorrhaphy - right  Social History: Last updated: 07/05/2007 Patient never smoked.  pt is married. pt has children. 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  , mild ill  Head:  normocephalic and atraumatic.   Eyes:  vision grossly intact, pupils equal, and pupils round.   Ears:  bialt tms's severe red , left much worse than right with bulging, canals clear Nose:  no external deformity and no nasal discharge.   Mouth:  no gingival abnormalities and pharyngeal erythema.   Neck:  supple and no masses.   Lungs:  normal respiratory effort and normal breath sounds.   Heart:  normal rate and regular rhythm.   Extremities:  no edema, no erythema    Impression & Recommendations:  Problem # 1:  OTITIS MEDIA, LEFT (ICD-382.9)  His updated medication list for this problem includes:    Bayer Childrens Aspirin 81 Mg Chew (Aspirin) .Marland Kitchen... Take 1 tablet by mouth once a day    Cephalexin 500 Mg Tabs (Cephalexin) .Marland Kitchen... 1po three times a day treat as above, f/u any worsening signs or symptoms   Problem # 2:  EUSTACHIAN TUBE DYSFUNCTION (ICD-381.81) fo OTC muinex as needed , and valsalva maneuver as needed   Problem # 3:  ALLERGIC RHINITIS (ICD-477.9)  His updated medication list for this problem includes:    Nasonex 50 Mcg/act Susp (Mometasone furoate) .Marland Kitchen... 2 spray/side once daily    Levocetirizine Dihydrochloride 5 Mg Tabs (Levocetirizine dihydrochloride) .Marland Kitchen... 1 by mouth once daily as needed allergies treat as above, f/u any worsening signs or symptoms   Problem # 4:  HYPERTENSION (ICD-401.9)  His updated medication list for this problem includes:    Amlodipine Besylate 5 Mg Tabs (Amlodipine besylate)  .Marland Kitchen... 1po once daily    Atenolol 25 Mg Tabs (Atenolol) .Marland Kitchen... 1 by mouth once daily  BP today: 112/82 Prior BP: 108/60 (07/18/2009)  Labs Reviewed: K+: 4.9 (07/18/2009) Creat: : 1.1 (07/18/2009)   Chol: 173 (07/18/2009)   HDL: 49.20 (07/18/2009)   LDL: 105 (07/18/2009)   TG: 96.0 (07/18/2009) stable overall by hx and exam, ok to continue meds/tx as is   Complete Medication List: 1)  Amlodipine Besylate 5 Mg Tabs (Amlodipine besylate) .Marland Kitchen.. 1po once daily 2)  Simvastatin 40 Mg Tabs (Simvastatin) .Marland Kitchen.. 1 by mouth once daily 3)  Prilosec 20 Mg Cpdr (Omeprazole) .... Take 1 tablet by mouth once a day 4)  Bayer Childrens Aspirin 81 Mg Chew (Aspirin) .... Take 1 tablet by  mouth once a day 5)  Trazodone Hcl 50 Mg Tabs (Trazodone hcl) .Marland Kitchen.. 1 by mouth at bedtime as needed 6)  Atenolol 25 Mg Tabs (Atenolol) .Marland Kitchen.. 1 by mouth once daily 7)  Nasonex 50 Mcg/act Susp (Mometasone furoate) .... 2 spray/side once daily 8)  Levocetirizine Dihydrochloride 5 Mg Tabs (Levocetirizine dihydrochloride) .Marland Kitchen.. 1 by mouth once daily as needed allergies 9)  Cephalexin 500 Mg Tabs (Cephalexin) .Marland Kitchen.. 1po three times a day  Patient Instructions: 1)  Please take all new medications as prescribed - the antibiotic, and the new antihistamine 2)  You can also use Mucinex OTC or it's generic for congestion  3)  Continue all previous medications as before this visit, including the nasal spray 4)  You can also try the Valsalva maneuver to help clear the ears as well two times a day as needed  5)  Please schedule a follow-up appointment in Mar 2012 with CPX labs , or sooner if needed Prescriptions: CEPHALEXIN 500 MG TABS (CEPHALEXIN) 1po three times a day  #30 x 0   Entered and Authorized by:   Corwin Levins MD   Signed by:   Corwin Levins MD on 09/20/2009   Method used:   Print then Give to Patient   RxID:   [id number] NASONEX 50 MCG/ACT SUSP (MOMETASONE FUROATE) 2 spray/side once daily  #1 x 5   Entered and Authorized by:    Corwin Levins MD   Signed by:   Corwin Levins MD on 09/20/2009   Method used:   Print then Give to Patient   RxID:   [id number] LEVOCETIRIZINE DIHYDROCHLORIDE 5 MG TABS (LEVOCETIRIZINE DIHYDROCHLORIDE) 1 by mouth once daily as needed allergies  #30 x 5   Entered and Authorized by:   Corwin Levins MD   Signed by:   Corwin Levins MD on 09/20/2009   Method used:   Print then Give to Patient   RxID:   [id number]

## 2010-06-09 ENCOUNTER — Encounter: Payer: Self-pay | Admitting: Internal Medicine

## 2010-06-09 ENCOUNTER — Other Ambulatory Visit (INDEPENDENT_AMBULATORY_CARE_PROVIDER_SITE_OTHER): Payer: BC Managed Care – PPO

## 2010-06-09 ENCOUNTER — Ambulatory Visit (INDEPENDENT_AMBULATORY_CARE_PROVIDER_SITE_OTHER): Payer: BC Managed Care – PPO | Admitting: Internal Medicine

## 2010-06-09 VITALS — BP 118/84 | HR 45 | Temp 98.2°F | Ht 72.0 in | Wt 200.4 lb

## 2010-06-09 DIAGNOSIS — Z0000 Encounter for general adult medical examination without abnormal findings: Secondary | ICD-10-CM

## 2010-06-09 DIAGNOSIS — J019 Acute sinusitis, unspecified: Secondary | ICD-10-CM | POA: Insufficient documentation

## 2010-06-09 DIAGNOSIS — Z0001 Encounter for general adult medical examination with abnormal findings: Secondary | ICD-10-CM | POA: Insufficient documentation

## 2010-06-09 LAB — CBC WITH DIFFERENTIAL/PLATELET
Basophils Absolute: 0 10*3/uL (ref 0.0–0.1)
Eosinophils Relative: 1.9 % (ref 0.0–5.0)
HCT: 44.6 % (ref 39.0–52.0)
Hemoglobin: 15.6 g/dL (ref 13.0–17.0)
Lymphs Abs: 2.3 10*3/uL (ref 0.7–4.0)
MCV: 86.5 fl (ref 78.0–100.0)
Monocytes Absolute: 0.8 10*3/uL (ref 0.1–1.0)
Monocytes Relative: 11.4 % (ref 3.0–12.0)
Neutro Abs: 3.8 10*3/uL (ref 1.4–7.7)
Platelets: 211 10*3/uL (ref 150.0–400.0)
RDW: 13.7 % (ref 11.5–14.6)

## 2010-06-09 LAB — TSH: TSH: 3.7 u[IU]/mL (ref 0.35–5.50)

## 2010-06-09 LAB — URINALYSIS, ROUTINE W REFLEX MICROSCOPIC
Bilirubin Urine: NEGATIVE
Ketones, ur: NEGATIVE
Specific Gravity, Urine: <=1.005 (ref 1.000–1.030)
Urine Glucose: NEGATIVE
pH: 5.5 (ref 5.0–8.0)

## 2010-06-09 LAB — LIPID PANEL
HDL: 49.2 mg/dL (ref >39.00)
LDL Cholesterol: 130 mg/dL — ABNORMAL HIGH (ref 0–99)
Total CHOL/HDL Ratio: 4
VLDL: 16.4 mg/dL (ref 0.0–40.0)

## 2010-06-09 LAB — HEPATIC FUNCTION PANEL
AST: 30 U/L (ref 0–37)
Albumin: 4.1 g/dL (ref 3.5–5.2)
Alkaline Phosphatase: 69 U/L (ref 39–117)
Total Bilirubin: 0.8 mg/dL (ref 0.3–1.2)

## 2010-06-09 LAB — BASIC METABOLIC PANEL
Calcium: 9.4 mg/dL (ref 8.4–10.5)
GFR: 79.9 mL/min (ref >60.00)
Potassium: 4.3 mEq/L (ref 3.5–5.1)
Sodium: 134 mEq/L — ABNORMAL LOW (ref 135–145)

## 2010-06-09 LAB — PSA: PSA: 1.21 ng/mL (ref 0.10–4.00)

## 2010-06-09 MED ORDER — LEVOFLOXACIN 250 MG PO TABS: 250.0000 mg | ORAL_TABLET | Freq: Every day | ORAL | Status: AC

## 2010-06-09 NOTE — Assessment & Plan Note (Signed)
stable overall by hx and exam, most recent lab reviewed with pt, and pt to continue medical treatment as before  Lab Results  Component Value Date   WBC 7.1 06/09/2010   HGB 15.6 06/09/2010   HCT 44.6 06/09/2010   PLT 211.0 06/09/2010   CHOL 196 06/09/2010   TRIG 82.0 06/09/2010   HDL 49.20 06/09/2010   ALT 35 06/09/2010   AST 30 06/09/2010   NA 134* 06/09/2010   K 4.3 06/09/2010   CL 100 06/09/2010   CREATININE 1.0 06/09/2010   BUN 17 06/09/2010   CO2 27 06/09/2010   TSH 3.70 06/09/2010   PSA 1.21 06/09/2010   BP Readings from Last 3 Encounters:  06/09/10 118/84  09/20/09 112/82  07/18/09 108/60

## 2010-06-09 NOTE — Assessment & Plan Note (Signed)

## 2010-06-09 NOTE — Assessment & Plan Note (Signed)
Mild to mod, tx with antibx course,  to f/u any worsening symptoms or concerns  

## 2010-06-09 NOTE — Patient Instructions (Signed)
Take all new medications as prescribed Continue all other medications as before Please go to LAB in the Basement for the blood and/or urine tests to be done today Please call the phone number [phone] (the PhoneTree System) for results of testing in 2-3 days;  When calling, simply dial the number, and when prompted enter the MRN number above (the Medical Record Number) and the # key, then the message should start. Please return in 1 year for your yearly visit, or sooner if needed, with Lab testing done 3-5 days before

## 2010-06-09 NOTE — Assessment & Plan Note (Signed)
.  stable overall by hx and exam, most recent lab reviewed with pt, and pt to continue medical treatment as before , can try OTC antihist

## 2010-06-09 NOTE — Progress Notes (Signed)
Subjective:    Patient ID: Thomas Caldwell, male    DOB: [birth year], 65 y.o.   MRN: [id number]  HPI   Here for wellness and f/u;  Overall doing ok;  Pt denies CP, worsening SOB, DOE, wheezing, orthopnea, PND, worsening LE edema, palpitations, dizziness or syncope.  Pt denies neurological change such as new Headache, facial or extremity weakness.  Pt denies polydipsia, polyuria, or low sugar symptoms. Pt states overall good compliance with treatment and medications, good tolerability, and trying to follow lower cholesterol diet.  Pt denies worsening depressive symptoms, suicidal ideation or panic. No fever, wt loss, night sweats, loss of appetite, or other constitutional symptoms.  Pt states good ability with ADL's, low fall risk, home safety reviewed and adequate, no significant changes in hearing or vision, and occasionally active with exercise.   Here also with 3 days acute onset fever, facial pain, pressure, general weakness and malaise, and greenish d/c, with slight ST. Does have several wks ongoing nasal allergy symptoms with clear congestion, itch and sneeze, without fever, pain, ST, cough or wheezing. Past Medical History  Diagnosis Date  . ANEURYSM 02/02/2007  . BRADYCARDIA, CHRONIC 10/06/2007  . ALLERGIC RHINITIS 01/11/2008  . GERD 07/05/2007  . HYPERLIPIDEMIA 02/02/2007  . HYPERTENSION 02/02/2007  . 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 DIVERTICULOSIS, COLON 07/05/2007  . 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 Lumbago 01/11/2008   No past surgical history on file.  reports that he has never smoked. He does not have any smokeless tobacco history on file. He reports that he drinks alcohol. His drug history not on file. family history is not on file. Allergies  Allergen Reactions  . Codeine     REACTION: confusion   No current outpatient prescriptions on file prior to visit.    Review of Systems Review of Systems  Constitutional: Negative for diaphoresis,  activity change, appetite change and unexpected weight change.  HENT: Negative for hearing loss, ear pain, facial swelling, mouth sores and neck stiffness.   Eyes: Negative for pain, redness and visual disturbance.  Respiratory: Negative for shortness of breath and wheezing.   Cardiovascular: Negative for chest pain and palpitations.  Gastrointestinal: Negative for diarrhea, blood in stool, abdominal distention and rectal pain.  Genitourinary: Negative for hematuria, flank pain and decreased urine volume.  Musculoskeletal: Negative for myalgias and joint swelling.  Skin: Negative for color change and wound.  Neurological: Negative for syncope and numbness.  Hematological: Negative for adenopathy.  Psychiatric/Behavioral: Negative for hallucinations, self-injury, decreased concentration and agitation.       Objective:   Physical Exam BP 118/84  Pulse 45  Temp(Src) 98.2 F (36.8 C) (Oral)  Ht 6' (1.829 m)  Wt 200 lb 6 oz (90.89 kg)  BMI 27.18 kg/m2  SpO2 96% Physical Exam  VS noted Constitutional: Pt is oriented to person, place, and time. Appears well-developed and well-nourished.  HENT:  Head: Normocephalic and atraumatic.  Right Ear: External ear normal.  Left Ear: External ear normal.  Nose: Nose normal.  Bilat tm's mild erythema.  Sinus tender bilat .  Pharynx mild erythema Eyes: Conjunctivae and EOM are normal. Pupils are equal, round, and reactive to light.  Neck: Normal range of motion. Neck supple. No JVD present. No tracheal deviation present.  Cardiovascular: Normal rate, regular rhythm, normal heart sounds and intact distal pulses.   Pulmonary/Chest: Effort normal and breath sounds normal.  Abdominal: Soft. Bowel sounds are normal. There is no tenderness.  Musculoskeletal: Normal range of motion. Exhibits  no edema.  Lymphadenopathy:  Has no cervical adenopathy.  Neurological: Pt is alert and oriented to person, place, and time. Pt has normal reflexes. No cranial  nerve deficit.  Skin: Skin is warm and dry. No rash noted.  Psychiatric:  Has  normal mood and affect. Behavior is normal.         Assessment & Plan:

## 2010-06-13 ENCOUNTER — Encounter: Payer: Self-pay | Admitting: Internal Medicine

## 2010-06-13 NOTE — Op Note (Signed)
NAMENORMAN, PIACENTINI           ACCOUNT NO.:  [id number]   MEDICAL RECORD NO.:  [id number]          PATIENT TYPE:  AMB   LOCATION:  SDS                          FACILITY:  [facility]   PHYSICIAN:  Danae Orleans. Venetia Maxon, M.D.  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   POSTOPERATIVE DIAGNOSIS:  Right L4-L5 herniated lumbar disc with  spondylosis, degenerative disc disease, and radiculopathy.   PROCEDURE:  Right L4-L5 microdiscectomy with L5 hemilaminectomy and  microdissection.   SURGEON:  Danae Orleans. Venetia Maxon, M.D.   ASSISTANT:  Hewitt Shorts, M.D.   ANESTHESIA:  General endotracheal anesthesia.   BLOOD LOSS:  300 mL.   COMPLICATIONS:  None.   DISPOSITION:  To recovery.   INDICATIONS:  Thomas Caldwell is a 65 year old man with right leg pain  and dorsiflexion and extensor hallucis longus weakness.  He had CT  myelogram which demonstrated a large free fragment disc herniation at L4-  L5 on the right extending down to the L5-S1 interspace.  It  was elected  to perform a microdiscectomy to decompress his right L5 nerve root.   PROCEDURE:  Mr. Sirico was brought to the operating room.  Following  satisfactory and uncomplicated induction of general endotracheal  anesthesia and placement of intravenous lines, the patient was placed in  a prone position on the Wilson frame.  His low back was then shaved,  prepped and draped in the usual sterile fashion.  The area of planned  incision was infiltrated with 0.25% Marcaine and 0-.5% lidocaine with  1:100,000 epinephrine.  An incision was made overlying what was felt to  be the L4-L and L5-S1 levels and carried through adipose tissue to the  lumbodorsal fascia which was incised on the right side of midline.  Subperiosteal  dissection was performed exposing what was felt to be the  L4-L5 and L5-S1 levels.  Interoperative x-ray demonstrated the marker  probe at S1-S2 and L5-S1 and consequently, the exposure was moved  cephalad to L4-L5, as well.  Under loupe magnification with a high-speed  drill, a hemilaminectomy of L4 was performed and ligamentum flavum was  detached and removed in a piecemeal fashion.  Because of the extensive  nature of the disc herniation, it was elected to perform a total  hemilaminectomy of L5 and this was done with a combination of a high  speed drill and Kerrison rongeurs.  The dura was decompressed and  ligamentous tissue removed in a piecemeal fashion.  The operating  microscope was brought into the field and using microdissection  technique, the thecal sac was mobilized medially exposing the L5 nerve  root as it coursed over the L4-L5 interspace and to the neural foramen.  There, the L5-S1 disc space was palpated and while there was some mild  bulging, there did not appear to be significant herniation at this  level. At the L4-L5 level, there was a significant disc herniation as  well as caudally  migrated subligamentous fragment of disc material.  The  disc material was removed with resultant significant decompression of  thecal sac and the take off the L5 nerve root and additional elevated  raised ligamentous tissue was initially cauterized and then removed with  Kerrison rongeurs.  Multiple fragments of disc material were removed  along with disc material that extended into the interspace and this was  cleared of residual loose disc material as was the lateral recess.  There appeared to be excellent decompression of the nerve roots and  thecal sac.  There did not appear to be residual compression at this  point. Hemostasis was then assured with Gelfoam soaked in thrombin.  The  wound was irrigated and instilled with Depo-Medrol and fentanyl into the  operative site.  The  self-retaining retractor was removed.  The  lumbodorsal fascia was closed with 0 Vicryl sutures.  The subcutaneous  tissues were approximated 2-0 Vicryl interrupted inverted sutures and  skin edges reapproximated with 3-0 Vicryl subcuticular stitch.  The  wound was dressed with Dermabond.  The patient was extubated in the  operating room and taken to the recovery room in stable satisfactory  having tolerated his operation well.  Counts were correct at the end of  the case.      Danae Orleans. Venetia Maxon, M.D.  Electronically Signed     JDS/MEDQ  D:  12/25/2005  T:  12/26/2005  Job:  [id number]

## 2010-07-01 ENCOUNTER — Ambulatory Visit (INDEPENDENT_AMBULATORY_CARE_PROVIDER_SITE_OTHER): Payer: BC Managed Care – PPO | Admitting: Internal Medicine

## 2010-07-01 ENCOUNTER — Encounter: Payer: Self-pay | Admitting: Internal Medicine

## 2010-07-01 ENCOUNTER — Ambulatory Visit (INDEPENDENT_AMBULATORY_CARE_PROVIDER_SITE_OTHER)
Admission: RE | Admit: 2010-07-01 | Discharge: 2010-07-01 | Disposition: A | Discharge status: Home / Self Care | Payer: BC Managed Care – PPO | Source: Ambulatory Visit | Attending: Internal Medicine | Admitting: Internal Medicine

## 2010-07-01 VITALS — BP 112/62 | HR 48 | Temp 97.7°F | Ht 72.0 in | Wt 203.0 lb

## 2010-07-01 DIAGNOSIS — M25539 Pain in unspecified wrist: Secondary | ICD-10-CM

## 2010-07-01 DIAGNOSIS — M25531 Pain in right wrist: Secondary | ICD-10-CM

## 2010-07-01 DIAGNOSIS — E785 Hyperlipidemia, unspecified: Secondary | ICD-10-CM

## 2010-07-01 DIAGNOSIS — I10 Essential (primary) hypertension: Secondary | ICD-10-CM

## 2010-07-01 MED ORDER — NABUMETONE 500 MG PO TABS: 500.0000 mg | ORAL_TABLET | Freq: Two times a day (BID) | ORAL | Status: DC

## 2010-07-01 NOTE — Patient Instructions (Signed)
Take all new medications as prescribed Continue all other medications as before Please go to XRAY in the Basement for the x-ray test Please call the phone number [phone] (the PhoneTree System) for results of testing in 2-3 days;  When calling, simply dial the number, and when prompted enter the MRN number above (the Medical Record Number) and the # key, then the message should start. You will be contacted regarding the referral for: Hand Surgury

## 2010-07-01 NOTE — Assessment & Plan Note (Addendum)
stable overall by hx and exam, most recent data reviewed with pt, and pt to continue medical treatment as before,  Too early on statin to re-check lipids, tolerating well Lab Results  Component Value Date   LDLCALC 130* 06/09/2010

## 2010-07-01 NOTE — Progress Notes (Signed)
  Here to f/u with c/o localized pain and swelling to the medial right wrist for 3 days after putting out mulch in the yard and having to open "100" mulch bags manually;  No prior hx of similar issue though has had an ulnar fracture in the past.  Pt denies chest pain, increased sob or doe, wheezing, orthopnea, PND, increased LE swelling, palpitations, dizziness or syncope.  Pt denies new neurological symptoms such as new headache, or facial or extremity weakness or numbness   Pt denies polydipsia, polyuria.  Right hand o/w without pain, swelling, numbness or weakness. Past Medical History  Diagnosis Date  . ANEURYSM 02/02/2007  . BRADYCARDIA, CHRONIC 10/06/2007  . ALLERGIC RHINITIS 01/11/2008  . GERD 07/05/2007  . HYPERLIPIDEMIA 02/02/2007  . HYPERTENSION 02/02/2007  . 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 DIVERTICULOSIS, COLON 07/05/2007  . 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 Lumbago 01/11/2008   Past Surgical History  Procedure Date  . Appendectomy 1982  . Sah left brain 1992    due to brain aneurysm s/p repair  . Lumbar disc surgery 12/2005    Dr. Venetia Maxon  . Hernia repair     right    reports that he has never smoked. He does not have any smokeless tobacco history on file. He reports that he drinks alcohol. He reports that he does not use illicit drugs. family history includes Heart disease in his father and sister and Hypertension in his brother and father. Allergies  Allergen Reactions  . Codeine     REACTION: confusion   Current Outpatient Prescriptions on File Prior to Visit  Medication Sig Dispense Refill  . amLODipine (NORVASC) 5 MG tablet Take 5 mg by mouth daily.        Marland Kitchen aspirin 81 MG EC tablet Take 81 mg by mouth daily.        Marland Kitchen atenolol (TENORMIN) 25 MG tablet Take 25 mg by mouth daily.        Marland Kitchen omeprazole (PRILOSEC) 20 MG capsule Take 20 mg by mouth daily.        . simvastatin (ZOCOR) 40 MG tablet Take 40 mg by mouth daily.        Marland Kitchen levocetirizine  (XYZAL) 5 MG tablet Take 5 mg by mouth daily.        . mometasone (NASONEX) 50 MCG/ACT nasal spray 2 sprays by Nasal route daily.        . traZODone (DESYREL) 50 MG tablet Take 50 mg by mouth at bedtime.         ROS:  All otherwise neg per pt  PE: BP 112/62  Pulse 48  Temp(Src) 97.7 F (36.5 C) (Oral)  Ht 6' (1.829 m)  Wt 203 lb (92.08 kg)  BMI 27.53 kg/m2  SpO2 96% Physical Exam  VS noted Constitutional: Pt appears well-developed and well-nourished.  HENT: Head: Normocephalic.  Right Ear: External ear normal.  Left Ear: External ear normal.  Eyes: Conjunctivae and EOM are normal. Pupils are equal, round, and reactive to light.  Neck: Normal range of motion. Neck supple.  Cardiovascular: Normal rate and regular rhythm.   Pulmonary/Chest: Effort normal and breath sounds normal.  Neurological: Pt is alert. No cranial nerve deficit.  Skin: Skin is warm. No erythema.  Psychiatric: Pt behavior is normal. Thought content normal.  Right wrist with mild locallized medial tender, swelling as the wrist with full flexion, but marked reduced extension  Right hand o/w neurovasc intact

## 2010-07-01 NOTE — Assessment & Plan Note (Signed)
stable overall by hx and exam, most recent data reviewed with pt, and pt to continue medical treatment as before  BP Readings from Last 3 Encounters:  07/01/10 112/62  06/09/10 118/84  09/20/09 112/82

## 2010-07-21 ENCOUNTER — Other Ambulatory Visit: Payer: Self-pay | Admitting: Internal Medicine

## 2010-08-24 ENCOUNTER — Other Ambulatory Visit: Payer: Self-pay | Admitting: Internal Medicine

## 2010-09-26 ENCOUNTER — Other Ambulatory Visit: Payer: Self-pay | Admitting: Internal Medicine

## 2010-10-22 ENCOUNTER — Other Ambulatory Visit: Payer: Self-pay

## 2010-10-22 MED ORDER — TRAZODONE HCL 50 MG PO TABS: 50.0000 mg | ORAL_TABLET | Freq: Every day | ORAL | Status: DC

## 2010-10-22 NOTE — Telephone Encounter (Signed)
Patient requesting a refill on his sleep medication. He is getting ready to go to [place]. Call back number is [phone]

## 2010-10-22 NOTE — Telephone Encounter (Signed)
Done hardcopy to robin  

## 2010-10-23 NOTE — Telephone Encounter (Signed)
Patient called back faxed hardcopy to [facility] [address].

## 2010-10-23 NOTE — Telephone Encounter (Signed)
Called the patient left message to call back 

## 2010-12-17 ENCOUNTER — Other Ambulatory Visit: Payer: Self-pay | Admitting: Internal Medicine

## 2011-02-16 ENCOUNTER — Telehealth: Payer: Self-pay

## 2011-02-16 MED ORDER — CIPROFLOXACIN HCL 500 MG PO TABS: 500.0000 mg | ORAL_TABLET | Freq: Two times a day (BID) | ORAL | Status: AC

## 2011-02-16 NOTE — Telephone Encounter (Signed)
Pt's spouse called stating will be traveling to [place] for ) days and is requesting a Rx for Cipro to take with him as a precaution, please advise.

## 2011-02-16 NOTE — Telephone Encounter (Signed)
i sent rx 

## 2011-02-16 NOTE — Telephone Encounter (Signed)
Pt informed of Rx/pharmacy 

## 2011-03-16 ENCOUNTER — Other Ambulatory Visit: Payer: Self-pay | Admitting: Internal Medicine

## 2011-04-06 ENCOUNTER — Ambulatory Visit (INDEPENDENT_AMBULATORY_CARE_PROVIDER_SITE_OTHER): Payer: BC Managed Care – PPO | Admitting: Internal Medicine

## 2011-04-06 ENCOUNTER — Encounter: Payer: Self-pay | Admitting: Internal Medicine

## 2011-04-06 VITALS — BP 118/72 | HR 60 | Temp 97.2°F | Resp 16 | Wt 204.0 lb

## 2011-04-06 DIAGNOSIS — J069 Acute upper respiratory infection, unspecified: Secondary | ICD-10-CM

## 2011-04-06 MED ORDER — DOXYCYCLINE HYCLATE 100 MG PO TABS: 100.0000 mg | ORAL_TABLET | Freq: Two times a day (BID) | ORAL | Status: AC

## 2011-04-06 NOTE — Patient Instructions (Signed)
Acute bronchitis - no evidence of any lung involvement. Plan - doxycyline 100 mg twice a day for 7 days.           Robitussin DM or the generic equivalent.           Hydrate, vitamin C, you might want to try ecchinacea.  Acute Bronchitis You have acute bronchitis. This means you have a chest cold. The airways in your lungs are red and sore (inflamed). Acute means it is sudden onset.   CAUSES Bronchitis is most often caused by the same virus that causes a cold. SYMPTOMS    Body aches.   Chest congestion.   Chills.   Cough.   Fever.   Shortness of breath.   Sore throat.  TREATMENT   Acute bronchitis is usually treated with rest, fluids, and medicines for relief of fever or cough. Most symptoms should go away after a few days or a week. Increased fluids may help thin your secretions and will prevent dehydration. Your caregiver may give you an inhaler to improve your symptoms. The inhaler reduces shortness of breath and helps control cough. You can take over-the-counter pain relievers or cough medicine to decrease coughing, pain, or fever. A cool-air vaporizer may help thin bronchial secretions and make it easier to clear your chest. Antibiotics are usually not needed but can be prescribed if you smoke, are seriously ill, have chronic lung problems, are elderly, or you are at higher risk for developing complications. Allergies and asthma can make bronchitis worse. Repeated episodes of bronchitis may cause longstanding lung problems. Avoid smoking and secondhand smoke. Exposure to cigarette smoke or irritating chemicals will make bronchitis worse. If you are a cigarette smoker, consider using nicotine gum or skin patches to help control withdrawal symptoms. Quitting smoking will help your lungs heal faster. Recovery from bronchitis is often slow, but you should start feeling better after 2 to 3 days. Cough from bronchitis frequently lasts for 3 to 4 weeks. To prevent another bout of acute  bronchitis:  Quit smoking.   Wash your hands frequently to get rid of viruses or use a hand sanitizer.   Avoid other people with cold or virus symptoms.   Try not to touch your hands to your mouth, nose, or eyes.  SEEK IMMEDIATE MEDICAL CARE IF:  You develop increased fever, chills, or chest pain.   You have severe shortness of breath or bloody sputum.   You develop dehydration, fainting, repeated vomiting, or a severe headache.   You have no improvement after 1 week of treatment or you get worse.  MAKE SURE YOU:    Understand these instructions.   Will watch your condition.   Will get help right away if you are not doing well or get worse.  Document Released: 02/20/2004 Document Revised: 01/01/2011 Document Reviewed: 05/07/2010 [facility] Patient Information 2012 [place], [place].

## 2011-04-07 NOTE — Progress Notes (Signed)
Subjective:    Patient ID: Thomas Caldwell, male    DOB: Apr 30, 1945, 66 y.o.   MRN: [id number]  HPI Thomas Caldwell present with several day history of URI symptoms: cough with productive sputum, sore throat. He denies SOB/DOE, enlarged lymph nodes. He is preparing to go to [place] in 9 days.  Past Medical History  Diagnosis Date  . ANEURYSM 02/02/2007  . BRADYCARDIA, CHRONIC 10/06/2007  . ALLERGIC RHINITIS 01/11/2008  . GERD 07/05/2007  . HYPERLIPIDEMIA 02/02/2007  . HYPERTENSION 02/02/2007  . 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 DIVERTICULOSIS, COLON 07/05/2007  . 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 Lumbago 01/11/2008   Past Surgical History  Procedure Date  . Appendectomy 1982  . Sah left brain 1992    due to brain aneurysm s/p repair  . Lumbar disc surgery 12/2005    Dr. Venetia Maxon  . Hernia repair     right   Family History  Problem Relation Age of Onset  . Hypertension Father   . Heart disease Father   . Heart disease Sister   . Hypertension Brother     3 bother with HTN   History   Social History  . Marital Status: Married    Spouse Name: N/A    Number of Children: N/A  . Years of Education: N/A   Occupational History  . Not on file.   Social History Main Topics  . Smoking status: Never Smoker   . Smokeless tobacco: Not on file  . Alcohol Use: Yes  . Drug Use: No  . Sexually Active: Not on file   Other Topics Concern  . Not on file   Social History Narrative  . No narrative on file   Current Outpatient Prescriptions on File Prior to Visit  Medication Sig Dispense Refill  . amLODipine (NORVASC) 5 MG tablet TAKE 1 TABLET BY MOUTH EVERY DAY  90 tablet  3  . aspirin 81 MG EC tablet Take 81 mg by mouth daily.        Marland Kitchen atenolol (TENORMIN) 25 MG tablet TAKE 1 TABLET BY MOUTH EVERY DAY  90 tablet  3  . nabumetone (RELAFEN) 500 MG tablet TAKE 1 TABLET BY MOUTH TWICE DAILY  60 tablet  3  . omeprazole (PRILOSEC) 20 MG capsule TAKE ONE CAPSULE  BY MOUTH DAILY  90 capsule  3  . simvastatin (ZOCOR) 40 MG tablet TAKE 1 TABLET BY MOUTH ONCE DAILY  90 tablet  2      Review of Systems System review is negative for any constitutional, cardiac, pulmonary, GI or neuro symptoms or complaints other than as described in the HPI.     Objective:   Physical Exam Filed Vitals:   04/06/11 1410  BP: 118/72  Pulse: 60  Temp: 97.2 F (36.2 C)  Resp: 16   Wt Readings from Last 3 Encounters:  04/06/11 204 lb (92.534 kg)  07/01/10 203 lb (92.08 kg)  06/09/10 200 lb 6 oz (90.89 kg)   Gen'l- WNWD older white man in no acute distress HEENT- no tenderness to percussion over frontal sinus; Right TM normal, some cerumen left; throat w/o erythema or exudate Nodes - negative cervical region Cor - RRR Pulm - normal respirations, no rales, no wheezes       Assessment & Plan:  URI - suggestive of bacterial infection.  Plan - doxycycline 100 mg bid           Supportive care: robitussin DM, hydration.  He is advised to take pseudoephedrine for flying.

## 2011-04-24 ENCOUNTER — Other Ambulatory Visit: Payer: Self-pay | Admitting: Internal Medicine

## 2011-04-27 NOTE — Telephone Encounter (Signed)
Called the patient left message on cell phone to schedule OV to get further refills and home number VM was full.

## 2011-05-01 ENCOUNTER — Other Ambulatory Visit (INDEPENDENT_AMBULATORY_CARE_PROVIDER_SITE_OTHER): Payer: BC Managed Care – PPO

## 2011-05-01 ENCOUNTER — Ambulatory Visit (INDEPENDENT_AMBULATORY_CARE_PROVIDER_SITE_OTHER): Payer: BC Managed Care – PPO | Admitting: Internal Medicine

## 2011-05-01 ENCOUNTER — Encounter: Payer: Self-pay | Admitting: Internal Medicine

## 2011-05-01 VITALS — BP 120/74 | HR 42 | Temp 97.2°F | Ht 72.0 in | Wt 201.1 lb

## 2011-05-01 DIAGNOSIS — Z0000 Encounter for general adult medical examination without abnormal findings: Secondary | ICD-10-CM

## 2011-05-01 LAB — HEPATIC FUNCTION PANEL
AST: 27 U/L (ref 0–37)
Albumin: 4.2 g/dL (ref 3.5–5.2)
Alkaline Phosphatase: 65 U/L (ref 39–117)

## 2011-05-01 LAB — URINALYSIS, ROUTINE W REFLEX MICROSCOPIC
Hgb urine dipstick: NEGATIVE
Nitrite: NEGATIVE
Specific Gravity, Urine: 1.02 (ref 1.000–1.030)
Total Protein, Urine: NEGATIVE
Urine Glucose: NEGATIVE
pH: 6 (ref 5.0–8.0)

## 2011-05-01 LAB — CBC WITH DIFFERENTIAL/PLATELET
Basophils Absolute: 0 10*3/uL (ref 0.0–0.1)
Hemoglobin: 14.3 g/dL (ref 13.0–17.0)
Lymphocytes Relative: 37.3 % (ref 12.0–46.0)
Monocytes Relative: 12.4 % — ABNORMAL HIGH (ref 3.0–12.0)
Neutro Abs: 3.2 10*3/uL (ref 1.4–7.7)
RBC: 4.91 Mil/uL (ref 4.22–5.81)
RDW: 14.1 % (ref 11.5–14.6)

## 2011-05-01 LAB — BASIC METABOLIC PANEL
BUN: 18 mg/dL (ref 6–23)
CO2: 29 mEq/L (ref 19–32)
Calcium: 9.5 mg/dL (ref 8.4–10.5)
Creatinine, Ser: 1.1 mg/dL (ref 0.4–1.5)
Glucose, Bld: 89 mg/dL (ref 70–99)

## 2011-05-01 LAB — LIPID PANEL: Total CHOL/HDL Ratio: 4

## 2011-05-01 LAB — TSH: TSH: 2.68 u[IU]/mL (ref 0.35–5.50)

## 2011-05-01 MED ORDER — PNEUMOCOCCAL VAC POLYVALENT 25 MCG/0.5ML IJ INJ
0.5000 mL | INJECTION | Freq: Once | INTRAMUSCULAR | Status: AC
  Administered 2011-05-01: 0.5 mL via INTRAMUSCULAR

## 2011-05-01 NOTE — Patient Instructions (Signed)
You had the pneumonia shot today Continue all other medications as before Please have the pharmacy call with any refills you may need. Please return in 1 year for your yearly visit, or sooner if needed, with Lab testing done 3-5 days before

## 2011-05-02 ENCOUNTER — Encounter: Payer: Self-pay | Admitting: Internal Medicine

## 2011-05-02 NOTE — Progress Notes (Signed)
Subjective:    Patient ID: Thomas Caldwell, male    DOB: 11/19/1945, 66 y.o.   MRN: [id number]  HPI  Here for wellness and f/u;  Overall doing ok;  Pt denies CP, worsening SOB, DOE, wheezing, orthopnea, PND, worsening LE edema, palpitations, dizziness or syncope.  Pt denies neurological change such as new Headache, facial or extremity weakness.  Pt denies polydipsia, polyuria, or low sugar symptoms. Pt states overall good compliance with treatment and medications, good tolerability, and trying to follow lower cholesterol diet.  Pt denies worsening depressive symptoms, suicidal ideation or panic. No fever, wt loss, night sweats, loss of appetite, or other constitutional symptoms.  Pt states good ability with ADL's, low fall risk, home safety reviewed and adequate, no significant changes in hearing or vision, and occasionally active with exercise.  Still travels to [facility] approx every 4 wks. No new complaints Past Medical History  Diagnosis Date  . ANEURYSM 02/02/2007  . BRADYCARDIA, CHRONIC 10/06/2007  . ALLERGIC RHINITIS 01/11/2008  . GERD 07/05/2007  . HYPERLIPIDEMIA 02/02/2007  . HYPERTENSION 02/02/2007  . 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 DIVERTICULOSIS, COLON 07/05/2007  . 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 Lumbago 01/11/2008   Past Surgical History  Procedure Date  . Appendectomy 1982  . Sah left brain 1992    due to brain aneurysm s/p repair  . Lumbar disc surgery 12/2005    Dr. Venetia Maxon  . Hernia repair     right    reports that he has never smoked. He does not have any smokeless tobacco history on file. He reports that he drinks alcohol. He reports that he does not use illicit drugs. family history includes Heart disease in his father and sister and Hypertension in his brother and father. Allergies  Allergen Reactions  . Codeine     REACTION: confusion   Current Outpatient Prescriptions on File Prior to Visit  Medication Sig Dispense Refill  .  amLODipine (NORVASC) 5 MG tablet TAKE 1 TABLET BY MOUTH EVERY DAY  90 tablet  3  . aspirin 81 MG EC tablet Take 81 mg by mouth daily.        Marland Kitchen atenolol (TENORMIN) 25 MG tablet TAKE 1 TABLET BY MOUTH EVERY DAY  90 tablet  3  . nabumetone (RELAFEN) 500 MG tablet TAKE 1 TABLET BY MOUTH TWICE DAILY  60 tablet  3  . omeprazole (PRILOSEC) 20 MG capsule TAKE ONE CAPSULE BY MOUTH DAILY  90 capsule  3  . simvastatin (ZOCOR) 40 MG tablet TAKE 1 TABLET BY MOUTH ONCE DAILY  90 tablet  2  . traZODone (DESYREL) 50 MG tablet TAKE 1 TABLET BY MOUTH EVERY NIGHT AT BEDTIME  90 tablet  0  . traZODone (DESYREL) 50 MG tablet Take 1 tablet (50 mg total) by mouth at bedtime.  90 tablet  1   Review of Systems Review of Systems  Constitutional: Negative for diaphoresis, activity change, appetite change and unexpected weight change.  HENT: Negative for hearing loss, ear pain, facial swelling, mouth sores and neck stiffness.   Eyes: Negative for pain, redness and visual disturbance.  Respiratory: Negative for shortness of breath and wheezing.   Cardiovascular: Negative for chest pain and palpitations.  Gastrointestinal: Negative for diarrhea, blood in stool, abdominal distention and rectal pain.  Genitourinary: Negative for hematuria, flank pain and decreased urine volume.  Musculoskeletal: Negative for myalgias and joint swelling.  Skin: Negative for color change and wound.  Neurological: Negative for syncope and numbness.  Hematological: Negative for adenopathy.  Psychiatric/Behavioral: Negative for hallucinations, self-injury, decreased concentration and agitation.      Objective:   Physical Exam BP 120/74  Pulse 42  Temp(Src) 97.2 F (36.2 C) (Oral)  Ht 6' (1.829 m)  Wt 201 lb 2 oz (91.23 kg)  BMI 27.28 kg/m2  SpO2 96% Physical Exam  VS noted Constitutional: Pt is oriented to person, place, and time. Appears well-developed and well-nourished.  HENT:  Head: Normocephalic and atraumatic.  Right Ear:  External ear normal.  Left Ear: External ear normal.  Nose: Nose normal.  Mouth/Throat: Oropharynx is clear and moist.  Eyes: Conjunctivae and EOM are normal. Pupils are equal, round, and reactive to light.  Neck: Normal range of motion. Neck supple. No JVD present. No tracheal deviation present.  Cardiovascular: Normal rate, regular rhythm, normal heart sounds and intact distal pulses.   Pulmonary/Chest: Effort normal and breath sounds normal.  Abdominal: Soft. Bowel sounds are normal. There is no tenderness.  Musculoskeletal: Normal range of motion. Exhibits no edema.  Lymphadenopathy:  Has no cervical adenopathy.  Neurological: Pt is alert and oriented to person, place, and time. Pt has normal reflexes. No cranial nerve deficit.  Skin: Skin is warm and dry. No rash noted.  Psychiatric:  Has  normal mood and affect. Behavior is normal.     Assessment & Plan:

## 2011-05-02 NOTE — Assessment & Plan Note (Signed)

## 2011-05-04 ENCOUNTER — Encounter: Payer: Self-pay | Admitting: Internal Medicine

## 2011-05-05 ENCOUNTER — Other Ambulatory Visit: Payer: Self-pay | Admitting: Internal Medicine

## 2011-06-02 ENCOUNTER — Encounter: Payer: Self-pay | Admitting: Internal Medicine

## 2011-06-02 ENCOUNTER — Ambulatory Visit (INDEPENDENT_AMBULATORY_CARE_PROVIDER_SITE_OTHER): Payer: BC Managed Care – PPO | Admitting: Internal Medicine

## 2011-06-02 VITALS — BP 108/64 | HR 47 | Temp 97.5°F | Ht 72.0 in | Wt 203.2 lb

## 2011-06-02 DIAGNOSIS — I10 Essential (primary) hypertension: Secondary | ICD-10-CM

## 2011-06-02 DIAGNOSIS — M542 Cervicalgia: Secondary | ICD-10-CM

## 2011-06-02 DIAGNOSIS — M549 Dorsalgia, unspecified: Secondary | ICD-10-CM | POA: Insufficient documentation

## 2011-06-02 DIAGNOSIS — E785 Hyperlipidemia, unspecified: Secondary | ICD-10-CM

## 2011-06-02 MED ORDER — CYCLOBENZAPRINE HCL 5 MG PO TABS: 5.0000 mg | ORAL_TABLET | Freq: Three times a day (TID) | ORAL | Status: AC | PRN

## 2011-06-02 NOTE — Assessment & Plan Note (Signed)
Exam benign, may have underlying c-spine djd or ddd, mild symptoms, exam benign, for flexeril prn,  to f/u any worsening symptoms or concerns

## 2011-06-02 NOTE — Assessment & Plan Note (Signed)
C/w msk strain, for flexeril prn, prednisone 10 qd for 5 days, and nsaid prn,  to f/u any worsening symptoms or concerns

## 2011-06-02 NOTE — Assessment & Plan Note (Signed)
Mild incr LDL from last yr - to follow lower chol diet.  to f/u any worsening symptoms or concerns

## 2011-06-02 NOTE — Patient Instructions (Addendum)
Take all new medications as prescribed - the muscle relaxer Please take 10 mg prednisone per day for 5 days (of the prednisone you have at home) Continue all other medications as before, including the generic relafen, or even tylenol for pain There seems to be no need for imaging today, although if the pain persists worsens over the next few weeks this could be considered Please follow lower cholesterol diet Please have the pharmacy call with any refills you may need. Please continue your efforts at being more active, low cholesterol diet, and weight control.

## 2011-06-02 NOTE — Progress Notes (Signed)
Subjective:    Patient ID: Thomas Caldwell, male    DOB: 1946/01/21, 66 y.o.   MRN: [id number]  HPI  Here to c/o pain x 4 wks to the area mid back just medial to the scapula, as well as the post lat left neck with some radiation to the upper left back, but no left shoulder or arm pain.  Mild, intermittent, worse to move the arm, better to rest the arm, non pleuritic, no fever, trauma, swelling, rash. Feels as if he "slept wrong" and it just wont get better.  Still plays golf ok, does not think he needs more pain medication, just not that bad. Pt denies chest pain, increased sob or doe, wheezing, orthopnea, PND, increased LE swelling, palpitations, dizziness or syncope.  Pt denies new neurological symptoms such as new headache, or facial or extremity weakness or numbness.   Pt denies polydipsia, polyuria. Past Medical History  Diagnosis Date  . ANEURYSM 02/02/2007  . BRADYCARDIA, CHRONIC 10/06/2007  . ALLERGIC RHINITIS 01/11/2008  . GERD 07/05/2007  . HYPERLIPIDEMIA 02/02/2007  . HYPERTENSION 02/02/2007  . 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 DIVERTICULOSIS, COLON 07/05/2007  . 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 Lumbago 01/11/2008   Past Surgical History  Procedure Date  . Appendectomy 1982  . Sah left brain 1992    due to brain aneurysm s/p repair  . Lumbar disc surgery 12/2005    Dr. Venetia Maxon  . Hernia repair     right    reports that he has never smoked. He does not have any smokeless tobacco history on file. He reports that he drinks alcohol. He reports that he does not use illicit drugs. family history includes Heart disease in his father and sister and Hypertension in his brother and father. Allergies  Allergen Reactions  . Codeine     REACTION: confusion   Current Outpatient Prescriptions on File Prior to Visit  Medication Sig Dispense Refill  . amLODipine (NORVASC) 5 MG tablet TAKE 1 TABLET BY MOUTH EVERY DAY  90 tablet  3  . aspirin 81 MG EC tablet Take  81 mg by mouth daily.        Marland Kitchen atenolol (TENORMIN) 25 MG tablet TAKE 1 TABLET BY MOUTH EVERY DAY  90 tablet  3  . nabumetone (RELAFEN) 500 MG tablet TAKE 1 TABLET BY MOUTH TWICE DAILY  60 tablet  3  . omeprazole (PRILOSEC) 20 MG capsule TAKE ONE CAPSULE BY MOUTH DAILY  90 capsule  3  . simvastatin (ZOCOR) 40 MG tablet TAKE 1 TABLET BY MOUTH ONCE DAILY  90 tablet  3  . traZODone (DESYREL) 50 MG tablet TAKE 1 TABLET BY MOUTH EVERY NIGHT AT BEDTIME  90 tablet  0  . traZODone (DESYREL) 50 MG tablet Take 1 tablet (50 mg total) by mouth at bedtime.  90 tablet  1   Review of Systems Constitutional: Negative for diaphoresis and unexpected weight change.  HENT: Negative for drooling and tinnitus.   Eyes: Negative for photophobia and visual disturbance.  Respiratory: Negative for choking and stridor.   Musculoskeletal: Negative for gait problem.  Skin: Negative for color change and wound.  Neurological: Negative for tremors and numbness.       Objective:   Physical Exam BP 108/64  Pulse 47  Temp(Src) 97.5 F (36.4 C) (Oral)  Ht 6' (1.829 m)  Wt 203 lb 4 oz (92.194 kg)  BMI 27.57 kg/m2  SpO2 97% Physical Exam  VS noted Constitutional: Pt appears well-developed  and well-nourished.  HENT: Head: Normocephalic.  Right Ear: External ear normal.  Left Ear: External ear normal.  Eyes: Conjunctivae and EOM are normal. Pupils are equal, round, and reactive to light.  Neck: Normal range of motion. Neck supple.  Cardiovascular: Normal rate and regular rhythm.   Pulmonary/Chest: Effort normal and breath sounds normal.  MSK:  Has some tender to left rhomboid area, but no spine tender, neck with FROM, no swelling, nontender Neurological: Pt is alert. No cranial nerve deficit. Motor/sens/dtr intact to UE's  Skin: Skin is warm. No erythema. No rash Psychiatric: Pt behavior is normal. Thought content normal. not depressed affect or nervous    Assessment & Plan:

## 2011-06-02 NOTE — Assessment & Plan Note (Signed)
stable overall by hx and exam, most recent data reviewed with pt, and pt to continue medical treatment as before BP Readings from Last 3 Encounters:  06/02/11 108/64  05/01/11 120/74  04/06/11 118/72

## 2011-06-11 ENCOUNTER — Encounter: Payer: BC Managed Care – PPO | Admitting: Internal Medicine

## 2011-07-01 ENCOUNTER — Other Ambulatory Visit: Payer: Self-pay

## 2011-07-01 MED ORDER — ATENOLOL 25 MG PO TABS: 25.0000 mg | ORAL_TABLET | Freq: Every day | ORAL | Status: DC

## 2011-07-01 MED ORDER — AMLODIPINE BESYLATE 5 MG PO TABS: 5.0000 mg | ORAL_TABLET | Freq: Every day | ORAL | Status: DC

## 2011-07-05 ENCOUNTER — Other Ambulatory Visit: Payer: Self-pay | Admitting: Internal Medicine

## 2011-07-06 NOTE — Telephone Encounter (Signed)
Done

## 2011-07-18 ENCOUNTER — Other Ambulatory Visit: Payer: Self-pay | Admitting: Internal Medicine

## 2011-07-20 NOTE — Telephone Encounter (Signed)
Done erx 

## 2011-07-22 ENCOUNTER — Other Ambulatory Visit: Payer: Self-pay | Admitting: Internal Medicine

## 2011-07-23 ENCOUNTER — Other Ambulatory Visit: Payer: Self-pay

## 2011-07-23 MED ORDER — TRAZODONE HCL 50 MG PO TABS: 50.0000 mg | ORAL_TABLET | Freq: Every day | ORAL | Status: DC

## 2011-07-23 NOTE — Telephone Encounter (Signed)
Done per emr 

## 2011-08-20 ENCOUNTER — Ambulatory Visit (INDEPENDENT_AMBULATORY_CARE_PROVIDER_SITE_OTHER): Payer: BC Managed Care – PPO | Admitting: Internal Medicine

## 2011-08-20 ENCOUNTER — Encounter: Payer: Self-pay | Admitting: Internal Medicine

## 2011-08-20 VITALS — BP 108/70 | HR 52 | Temp 97.5°F | Ht 72.0 in | Wt 201.0 lb

## 2011-08-20 DIAGNOSIS — K529 Noninfective gastroenteritis and colitis, unspecified: Secondary | ICD-10-CM

## 2011-08-20 DIAGNOSIS — K219 Gastro-esophageal reflux disease without esophagitis: Secondary | ICD-10-CM

## 2011-08-20 DIAGNOSIS — K5289 Other specified noninfective gastroenteritis and colitis: Secondary | ICD-10-CM

## 2011-08-20 DIAGNOSIS — I10 Essential (primary) hypertension: Secondary | ICD-10-CM

## 2011-08-20 MED ORDER — PROMETHAZINE HCL 25 MG PO TABS: 25.0000 mg | ORAL_TABLET | Freq: Four times a day (QID) | ORAL | Status: DC | PRN

## 2011-08-20 MED ORDER — CIPROFLOXACIN HCL 500 MG PO TABS: 500.0000 mg | ORAL_TABLET | Freq: Two times a day (BID) | ORAL | Status: AC

## 2011-08-20 MED ORDER — DIPHENOXYLATE-ATROPINE 2.5-0.025 MG PO TABS: 1.0000 | ORAL_TABLET | Freq: Four times a day (QID) | ORAL | Status: AC | PRN

## 2011-08-20 NOTE — Progress Notes (Signed)
Subjective:    Patient ID: Thomas Caldwell, male    DOB: 01-06-1946, 66 y.o.   MRN: [id number]  HPI  Here for acute visit, just returned from [place] late last wk, then started feeling poorly with general fatigue and malaise July 20 (sat), then with low grade temp, sinus congestion, mild nonprod cough, then n/v, crampy abd pains and multiple episodes watery loose stools over the past 3-4 days, as well as mild abd distension.  Denies worsening reflux, dysphagia, or blood.  Co-worker also with similar illness who traveled with him as well.  Has had food poisoning in the past, but this illness more severe and longer lasting.  No chills and  Denies urinary symptoms such as dysuria, frequency, urgency,or hematuria.  Pt denies chest pain, increased sob or doe, wheezing, orthopnea, PND, increased LE swelling, palpitations, dizziness or syncope.  No rash, or joint pains. Denies worsening reflux, dysphagia, blood.  Past Medical History  Diagnosis Date  . ANEURYSM 02/02/2007  . BRADYCARDIA, CHRONIC 10/06/2007  . ALLERGIC RHINITIS 01/11/2008  . GERD 07/05/2007  . HYPERLIPIDEMIA 02/02/2007  . HYPERTENSION 02/02/2007  . 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 DIVERTICULOSIS, COLON 07/05/2007  . 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 Lumbago 01/11/2008   Past Surgical History  Procedure Date  . Appendectomy 1982  . Sah left brain 1992    due to brain aneurysm s/p repair  . Lumbar disc surgery 12/2005    Dr. Venetia Maxon  . Hernia repair     right    reports that he has never smoked. He does not have any smokeless tobacco history on file. He reports that he drinks alcohol. He reports that he does not use illicit drugs. family history includes Heart disease in his father and sister and Hypertension in his brother and father. Allergies  Allergen Reactions  . Codeine     REACTION: confusion   Current Outpatient Prescriptions on File Prior to Visit  Medication Sig Dispense Refill  .  amLODipine (NORVASC) 5 MG tablet Take 1 tablet (5 mg total) by mouth daily.  90 tablet  3  . aspirin 81 MG EC tablet Take 81 mg by mouth daily.        Marland Kitchen atenolol (TENORMIN) 25 MG tablet Take 1 tablet (25 mg total) by mouth daily.  90 tablet  3  . nabumetone (RELAFEN) 500 MG tablet TAKE 1 TABLET BY MOUTH TWICE DAILY  60 tablet  5  . omeprazole (PRILOSEC) 20 MG capsule TAKE ONE CAPSULE BY MOUTH DAILY  90 capsule  1  . simvastatin (ZOCOR) 40 MG tablet TAKE 1 TABLET BY MOUTH ONCE DAILY  90 tablet  3  . traZODone (DESYREL) 50 MG tablet Take 1 tablet (50 mg total) by mouth at bedtime.  90 tablet  5  . promethazine (PHENERGAN) 25 MG tablet Take 1 tablet (25 mg total) by mouth every 6 (six) hours as needed for nausea.  40 tablet  0  . traZODone (DESYREL) 50 MG tablet Take 1 tablet (50 mg total) by mouth at bedtime.  90 tablet  1   Review of Systems Review of Systems  Constitutional: Negative for diaphoresis and unexpected weight change.  HENT: Negative for tinnitus.   Eyes: Negative for visual disturbance.  Respiratory: Negative for choking and stridor.   Gastrointestinal: Negative for blood in stool.  Genitourinary: Negative for hematuria and decreased urine volume.  Musculoskeletal: Negative for gait problem.  Skin: Negative for color change and wound.    Objective:  Physical Exam BP 108/70  Pulse 52  Temp 97.5 F (36.4 C) (Oral)  Ht 6' (1.829 m)  Wt 201 lb (91.173 kg)  BMI 27.26 kg/m2  SpO2 97% Physical Exam  VS noted, mild ill Constitutional: Pt appears well-developed and well-nourished.  HENT: Head: Normocephalic.  Right Ear: External ear normal.  Left Ear: External ear normal.  Eyes: Conjunctivae and EOM are normal. Pupils are equal, round, and reactive to light.  Neck: Normal range of motion. Neck supple.  Cardiovascular: Normal rate and regular rhythm.   Pulmonary/Chest: Effort normal and breath sounds normal.  Abd:  Soft,  non-distended, + BS, diffuse mild tender somewhat  worst at epigsatric/LUQ/LLQ withut guarding or rebound Neurological: Pt is alert. Not confused Skin: Skin is warm. No erythema. No rash Psychiatric: Pt behavior is normal. Thought content normal.     Assessment & Plan:

## 2011-08-20 NOTE — Assessment & Plan Note (Signed)
stable overall by hx and exam, most recent data reviewed with pt, and pt to continue medical treatment as before Lab Results  Component Value Date   WBC 6.8 05/01/2011   HGB 14.3 05/01/2011   HCT 43.2 05/01/2011   PLT 190.0 05/01/2011   GLUCOSE 89 05/01/2011   CHOL 165 05/01/2011   TRIG 92.0 05/01/2011   HDL 38.00* 05/01/2011   LDLCALC 109* 05/01/2011   ALT 31 05/01/2011   AST 27 05/01/2011   NA 139 05/01/2011   K 5.0 05/01/2011   CL 104 05/01/2011   CREATININE 1.1 05/01/2011   BUN 18 05/01/2011   CO2 29 05/01/2011   TSH 2.68 05/01/2011   PSA 1.25 05/01/2011

## 2011-08-20 NOTE — Patient Instructions (Addendum)
Take all new medications as prescribed Continue all other medications as before Please drink more fluids, and the "BRAT" diet

## 2011-08-20 NOTE — Assessment & Plan Note (Signed)
Exam c/w this, cant r/o traveler's diarrhea vs viral, overall mild presentation- for empiric cipro asd, monitor worsening s/s such as fever, n/v, pain, worsening distension, blood, chills

## 2011-08-20 NOTE — Assessment & Plan Note (Signed)
stable overall by hx and exam, most recent data reviewed with pt, and pt to continue medical treatment as before BP Readings from Last 3 Encounters:  08/20/11 108/70  06/02/11 108/64  05/01/11 120/74

## 2011-09-07 ENCOUNTER — Other Ambulatory Visit: Payer: BC Managed Care – PPO

## 2011-09-07 ENCOUNTER — Encounter: Payer: Self-pay | Admitting: Internal Medicine

## 2011-09-07 ENCOUNTER — Ambulatory Visit (INDEPENDENT_AMBULATORY_CARE_PROVIDER_SITE_OTHER): Payer: BC Managed Care – PPO | Admitting: Internal Medicine

## 2011-09-07 VITALS — BP 106/72 | HR 45 | Temp 97.4°F | Ht 72.0 in | Wt 200.0 lb

## 2011-09-07 DIAGNOSIS — I10 Essential (primary) hypertension: Secondary | ICD-10-CM

## 2011-09-07 DIAGNOSIS — R5381 Other malaise: Secondary | ICD-10-CM

## 2011-09-07 DIAGNOSIS — R5383 Other fatigue: Secondary | ICD-10-CM

## 2011-09-07 DIAGNOSIS — H6692 Otitis media, unspecified, left ear: Secondary | ICD-10-CM

## 2011-09-07 DIAGNOSIS — H6690 Otitis media, unspecified, unspecified ear: Secondary | ICD-10-CM

## 2011-09-07 MED ORDER — AZITHROMYCIN 250 MG PO TABS: ORAL_TABLET | ORAL | Status: AC

## 2011-09-07 NOTE — Assessment & Plan Note (Signed)
Mild to mod, for antibx course,  to f/u any worsening symptoms or concerns 

## 2011-09-07 NOTE — Assessment & Plan Note (Signed)
stable overall by hx and exam, most recent data reviewed with pt, and pt to continue medical treatment as before BP Readings from Last 3 Encounters:  09/07/11 106/72  08/20/11 108/70  06/02/11 108/64

## 2011-09-07 NOTE — Assessment & Plan Note (Signed)
Exam bening, ok for testosterone check, Etiology unclear,  follow with expectant management

## 2011-09-07 NOTE — Progress Notes (Signed)
Subjective:    Patient ID: Thomas Caldwell, male    DOB: Jan 15, 1946, 66 y.o.   MRN: [id number]  HPI  Here with acute onset mild to mod ST and left ear pain for 2-3 days, occurs after sick contact exposure in [place] (just returned) with low grade temp, without HA, dizziness, and Pt denies chest pain, increased sob or doe, wheezing, orthopnea, PND, increased LE swelling, palpitations, dizziness or syncope.  Pt denies new neurological symptoms such as new headache, or facial or extremity weakness or numbness   Pt denies polydipsia, polyuria.   Pt denies fever, wt loss, night sweats, loss of appetite, or other constitutional symptoms except for the above.  Tends to have frequent symtpoms after trips, always gets better between episodes. Does have sense of ongoing fatigue, but denies signficant hypersomnolence, asks for testosterone check. Past Medical History  Diagnosis Date  . ANEURYSM 02/02/2007  . BRADYCARDIA, CHRONIC 10/06/2007  . ALLERGIC RHINITIS 01/11/2008  . GERD 07/05/2007  . HYPERLIPIDEMIA 02/02/2007  . HYPERTENSION 02/02/2007  . 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 DIVERTICULOSIS, COLON 07/05/2007  . 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 Lumbago 01/11/2008   Past Surgical History  Procedure Date  . Appendectomy 1982  . Sah left brain 1992    due to brain aneurysm s/p repair  . Lumbar disc surgery 12/2005    Dr. Venetia Maxon  . Hernia repair     right    reports that he has never smoked. He does not have any smokeless tobacco history on file. He reports that he drinks alcohol. He reports that he does not use illicit drugs. family history includes Heart disease in his father and sister and Hypertension in his brother and father. Allergies  Allergen Reactions  . Codeine     REACTION: confusion   Current Outpatient Prescriptions on File Prior to Visit  Medication Sig Dispense Refill  . amLODipine (NORVASC) 5 MG tablet Take 1 tablet (5 mg total) by mouth daily.  90  tablet  3  . aspirin 81 MG EC tablet Take 81 mg by mouth daily.        Marland Kitchen atenolol (TENORMIN) 25 MG tablet Take 1 tablet (25 mg total) by mouth daily.  90 tablet  3  . nabumetone (RELAFEN) 500 MG tablet TAKE 1 TABLET BY MOUTH TWICE DAILY  60 tablet  5  . omeprazole (PRILOSEC) 20 MG capsule TAKE ONE CAPSULE BY MOUTH DAILY  90 capsule  1  . simvastatin (ZOCOR) 40 MG tablet TAKE 1 TABLET BY MOUTH ONCE DAILY  90 tablet  3  . traZODone (DESYREL) 50 MG tablet Take 1 tablet (50 mg total) by mouth at bedtime.  90 tablet  5  . promethazine (PHENERGAN) 25 MG tablet Take 1 tablet (25 mg total) by mouth every 6 (six) hours as needed for nausea.  40 tablet  0  . traZODone (DESYREL) 50 MG tablet Take 1 tablet (50 mg total) by mouth at bedtime.  90 tablet  1   Review of Systems Review of Systems  Constitutional: Negative for diaphoresis and unexpected weight change.  HENT: Negative for drooling and tinnitus.   Eyes: Negative for photophobia and visual disturbance.  Respiratory: Negative for choking and stridor.   Gastrointestinal: Negative for vomiting and blood in stool.  Genitourinary: Negative for hematuria and decreased urine volume.  Musculoskeletal: Negative for gait problem.  Skin: Negative for color change and wound.  Neurological: Negative for tremors and numbness.    Objective:   Physical  Exam BP 106/72  Pulse 45  Temp 97.4 F (36.3 C) (Oral)  Ht 6' (1.829 m)  Wt 200 lb (90.719 kg)  BMI 27.12 kg/m2  SpO2 97% Physical Exam  VS noted Constitutional: Pt appears well-developed and well-nourished.  HENT: Head: Normocephalic.  Right Ear: External ear normal.  Left Ear: External ear normal.  Left tm severe erythema.  Sinus nontender.  Pharynx mild erythema Eyes: Conjunctivae and EOM are normal. Pupils are equal, round, and reactive to light.  Neck: Normal range of motion. Neck supple.  Cardiovascular: Normal rate and regular rhythm.   Pulmonary/Chest: Effort normal and breath sounds  normal.  Neurological: Pt is alert. No cranial nerve deficit. motor intact Skin: Skin is warm. NO rash    Assessment & Plan:

## 2011-09-07 NOTE — Patient Instructions (Addendum)
Take all new medications as prescribed. Continue all other medications as before Please have the pharmacy call with any refills you may need. Please go to LAB in the Basement for the blood test to be done at your convenience - the testosterone You will be contacted by phone if any changes need to be made immediately.  Otherwise, you will receive a letter about your results with an explanation.

## 2011-09-08 ENCOUNTER — Encounter: Payer: Self-pay | Admitting: Internal Medicine

## 2011-09-08 LAB — TESTOSTERONE, FREE, TOTAL, SHBG
Sex Hormone Binding: 63 nmol/L (ref 13–71)
Testosterone, Free: 57.3 pg/mL (ref 47.0–244.0)
Testosterone-% Free: 1.3 % — ABNORMAL LOW (ref 1.6–2.9)
Testosterone: 436.08 ng/dL (ref 300–890)

## 2011-12-28 ENCOUNTER — Other Ambulatory Visit: Payer: Self-pay

## 2011-12-28 MED ORDER — OMEPRAZOLE 20 MG PO CPDR: 20.0000 mg | DELAYED_RELEASE_CAPSULE | Freq: Every day | ORAL | Status: DC

## 2012-01-19 ENCOUNTER — Ambulatory Visit (INDEPENDENT_AMBULATORY_CARE_PROVIDER_SITE_OTHER): Payer: BC Managed Care – PPO | Admitting: Internal Medicine

## 2012-01-19 ENCOUNTER — Encounter: Payer: Self-pay | Admitting: Internal Medicine

## 2012-01-19 VITALS — BP 124/62 | HR 40 | Temp 97.3°F | Ht 72.0 in | Wt 216.0 lb

## 2012-01-19 DIAGNOSIS — I498 Other specified cardiac arrhythmias: Secondary | ICD-10-CM

## 2012-01-19 DIAGNOSIS — I10 Essential (primary) hypertension: Secondary | ICD-10-CM

## 2012-01-19 DIAGNOSIS — R040 Epistaxis: Secondary | ICD-10-CM | POA: Insufficient documentation

## 2012-01-19 DIAGNOSIS — J019 Acute sinusitis, unspecified: Secondary | ICD-10-CM | POA: Insufficient documentation

## 2012-01-19 MED ORDER — LEVOFLOXACIN 250 MG PO TABS: 250.0000 mg | ORAL_TABLET | Freq: Every day | ORAL | Status: DC

## 2012-01-19 MED ORDER — HYDROCODONE-HOMATROPINE 5-1.5 MG/5ML PO SYRP: 5.0000 mL | ORAL_SOLUTION | Freq: Four times a day (QID) | ORAL | Status: DC | PRN

## 2012-01-19 NOTE — Assessment & Plan Note (Signed)
Also for CT sinus, refer ENT,  to f/u any worsening symptoms or concerns

## 2012-01-19 NOTE — Assessment & Plan Note (Signed)
stable overall by hx and exam, most recent data reviewed with pt, and pt to continue medical treatment as before BP Readings from Last 3 Encounters:  01/19/12 124/62  09/07/11 106/72  08/20/11 108/70

## 2012-01-19 NOTE — Progress Notes (Signed)
Subjective:    Patient ID: Thomas Caldwell, male    DOB: 1946-01-23, 66 y.o.   MRN: [id number]  HPI  Here with acute, just back from [place] a few days ago.  Here with 3 days acute onset fever, facial pain, pressure, general weakness and malaise, and greenish d/c, with slight ST, but little to no cough and Pt denies chest pain, increased sob or doe, wheezing, orthopnea, PND, increased LE swelling, palpitations, dizziness or syncope.  Pt denies new neurological symptoms such as new headache, or facial or extremity weakness or numbness   Pt denies polydipsia, polyuria.  Has had 2 rather significant episodes of right nasal epistaxis as wellk has had similar episodes in the past.   Past Medical History  Diagnosis Date  . ANEURYSM 02/02/2007  . BRADYCARDIA, CHRONIC 10/06/2007  . ALLERGIC RHINITIS 01/11/2008  . GERD 07/05/2007  . HYPERLIPIDEMIA 02/02/2007  . HYPERTENSION 02/02/2007  . 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 DIVERTICULOSIS, COLON 07/05/2007  . 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 Lumbago 01/11/2008   Past Surgical History  Procedure Date  . Appendectomy 1982  . Sah left brain 1992    due to brain aneurysm s/p repair  . Lumbar disc surgery 12/2005    Dr. Venetia Maxon  . Hernia repair     right    reports that he has never smoked. He does not have any smokeless tobacco history on file. He reports that he drinks alcohol. He reports that he does not use illicit drugs. family history includes Heart disease in his father and sister and Hypertension in his brother and father. Allergies  Allergen Reactions  . Codeine     REACTION: confusion   Current Outpatient Prescriptions on File Prior to Visit  Medication Sig Dispense Refill  . amLODipine (NORVASC) 5 MG tablet Take 1 tablet (5 mg total) by mouth daily.  90 tablet  3  . aspirin 81 MG EC tablet Take 81 mg by mouth daily.        Marland Kitchen atenolol (TENORMIN) 25 MG tablet Take 1 tablet (25 mg total) by mouth daily.  90 tablet   3  . nabumetone (RELAFEN) 500 MG tablet TAKE 1 TABLET BY MOUTH TWICE DAILY  60 tablet  5  . omeprazole (PRILOSEC) 20 MG capsule Take 1 capsule (20 mg total) by mouth daily.  90 capsule  2  . simvastatin (ZOCOR) 40 MG tablet TAKE 1 TABLET BY MOUTH ONCE DAILY  90 tablet  3  . traZODone (DESYREL) 50 MG tablet Take 1 tablet (50 mg total) by mouth at bedtime.  90 tablet  5  . promethazine (PHENERGAN) 25 MG tablet Take 1 tablet (25 mg total) by mouth every 6 (six) hours as needed for nausea.  40 tablet  0  . traZODone (DESYREL) 50 MG tablet Take 1 tablet (50 mg total) by mouth at bedtime.  90 tablet  1   Review of Systems  Constitutional: Negative for diaphoresis and unexpected weight change.  HENT: Negative for tinnitus.   Eyes: Negative for photophobia and visual disturbance.  Respiratory: Negative for choking and stridor.   Gastrointestinal: Negative for vomiting and blood in stool.  Genitourinary: Negative for hematuria and decreased urine volume.  Musculoskeletal: Negative for gait problem.  Skin: Negative for color change and wound.  Neurological: Negative for tremors and numbness.  Psychiatric/Behavioral: Negative for decreased concentration. The patient is not hyperactive.       Objective:   Physical Exam BP 124/62  Pulse 40  Temp 97.3 F (36.3 C) (Oral)  Ht 6' (1.829 m)  Wt 216 lb (97.977 kg)  BMI 29.29 kg/m2  SpO2 97% Physical Exam  VS noted, mild ill Constitutional: Pt appears well-developed and well-nourished.  HENT: Head: Normocephalic.  Right Ear: External ear normal.  Left Ear: External ear normal.  Bilat tm's mild erythema.  Sinus tender bilat.  Pharynx mild erythema  Eyes: Conjunctivae and EOM are normal. Pupils are equal, round, and reactive to light.  Neck: Normal range of motion. Neck supple.  Cardiovascular: Normal rate and regular rhythm.   Pulmonary/Chest: Effort normal and breath sounds normal.  Neurological: Pt is alert. Not confused  Skin: Skin is warm.  No erythema.  Psychiatric: Pt behavior is normal. Thought content normal.     Assessment & Plan:

## 2012-01-19 NOTE — Patient Instructions (Addendum)
Take all new medications as prescribed - the antibiotic, and cough med if needed Continue all other medications as before You will be contacted regarding the referral for: CT sinus You can also take Delsym OTC for cough, and/or Mucinex (or it's generic off brand) for congestion Thank you for enrolling in MyChart. Please follow the instructions below to securely access your online medical record. MyChart allows you to send messages to your doctor, view your test results, renew your prescriptions, schedule appointments, and more. To Log into MyChart, please go to [email or web address], and your Username is: [email or web address]

## 2012-01-19 NOTE — Assessment & Plan Note (Signed)
stable overall by hx and exam, and pt to continue medical treatment as before 

## 2012-01-19 NOTE — Assessment & Plan Note (Signed)
Mild to mod, for antibx course,  to f/u any worsening symptoms or concerns, also for CT sinus

## 2012-01-22 ENCOUNTER — Ambulatory Visit (INDEPENDENT_AMBULATORY_CARE_PROVIDER_SITE_OTHER)
Admission: RE | Admit: 2012-01-22 | Discharge: 2012-01-22 | Disposition: A | Discharge status: Home / Self Care | Payer: BC Managed Care – PPO | Source: Ambulatory Visit | Attending: Internal Medicine | Admitting: Internal Medicine

## 2012-01-22 DIAGNOSIS — R040 Epistaxis: Secondary | ICD-10-CM

## 2012-01-22 DIAGNOSIS — J019 Acute sinusitis, unspecified: Secondary | ICD-10-CM

## 2012-03-12 ENCOUNTER — Other Ambulatory Visit: Payer: Self-pay

## 2012-04-22 ENCOUNTER — Other Ambulatory Visit: Payer: Self-pay | Admitting: Internal Medicine

## 2012-05-02 ENCOUNTER — Encounter: Payer: BC Managed Care – PPO | Admitting: Internal Medicine

## 2012-06-08 ENCOUNTER — Encounter: Payer: BC Managed Care – PPO | Admitting: Internal Medicine

## 2012-06-15 ENCOUNTER — Other Ambulatory Visit: Payer: Self-pay | Admitting: Internal Medicine

## 2012-06-16 ENCOUNTER — Other Ambulatory Visit (INDEPENDENT_AMBULATORY_CARE_PROVIDER_SITE_OTHER): Payer: BC Managed Care – PPO

## 2012-06-16 ENCOUNTER — Telehealth: Payer: Self-pay

## 2012-06-16 DIAGNOSIS — Z0000 Encounter for general adult medical examination without abnormal findings: Secondary | ICD-10-CM

## 2012-06-16 LAB — CBC WITH DIFFERENTIAL/PLATELET
Basophils Relative: 1.1 % (ref 0.0–3.0)
Eosinophils Absolute: 0.2 10*3/uL (ref 0.0–0.7)
HCT: 45.1 % (ref 39.0–52.0)
Lymphs Abs: 2.5 10*3/uL (ref 0.7–4.0)
MCHC: 34.3 g/dL (ref 30.0–36.0)
MCV: 86.9 fl (ref 78.0–100.0)
Monocytes Absolute: 0.8 10*3/uL (ref 0.1–1.0)
Neutro Abs: 2.6 10*3/uL (ref 1.4–7.7)
Neutrophils Relative %: 41.7 % — ABNORMAL LOW (ref 43.0–77.0)
RBC: 5.19 Mil/uL (ref 4.22–5.81)

## 2012-06-16 LAB — URINALYSIS, ROUTINE W REFLEX MICROSCOPIC
Leukocytes, UA: NEGATIVE
Nitrite: NEGATIVE
RBC / HPF: NONE SEEN (ref 0–?)
Specific Gravity, Urine: >=1.03 (ref 1.000–1.030)
Urobilinogen, UA: 0.2 (ref 0.0–1.0)
pH: 6 (ref 5.0–8.0)

## 2012-06-16 LAB — BASIC METABOLIC PANEL
BUN: 18 mg/dL (ref 6–23)
Calcium: 9.2 mg/dL (ref 8.4–10.5)
Creatinine, Ser: 1.4 mg/dL (ref 0.4–1.5)
GFR: 56.16 mL/min — ABNORMAL LOW (ref >60.00)
Glucose, Bld: 94 mg/dL (ref 70–99)
Potassium: 5.1 mEq/L (ref 3.5–5.1)

## 2012-06-16 LAB — LIPID PANEL
HDL: 41.8 mg/dL (ref >39.00)
Total CHOL/HDL Ratio: 4
VLDL: 23.2 mg/dL (ref 0.0–40.0)

## 2012-06-16 LAB — TSH: TSH: 5.81 u[IU]/mL — ABNORMAL HIGH (ref 0.35–5.50)

## 2012-06-16 LAB — HEPATIC FUNCTION PANEL
Albumin: 3.9 g/dL (ref 3.5–5.2)
Total Bilirubin: 0.7 mg/dL (ref 0.3–1.2)

## 2012-06-16 LAB — PSA: PSA: 1.45 ng/mL (ref 0.10–4.00)

## 2012-06-16 NOTE — Telephone Encounter (Signed)
Labs entered.

## 2012-06-22 ENCOUNTER — Other Ambulatory Visit: Payer: Self-pay | Admitting: Internal Medicine

## 2012-06-24 ENCOUNTER — Telehealth: Payer: Self-pay | Admitting: Internal Medicine

## 2012-06-24 ENCOUNTER — Ambulatory Visit (INDEPENDENT_AMBULATORY_CARE_PROVIDER_SITE_OTHER): Payer: BC Managed Care – PPO | Admitting: Internal Medicine

## 2012-06-24 DIAGNOSIS — M25519 Pain in unspecified shoulder: Secondary | ICD-10-CM

## 2012-06-24 DIAGNOSIS — M25512 Pain in left shoulder: Secondary | ICD-10-CM

## 2012-06-24 DIAGNOSIS — Z0000 Encounter for general adult medical examination without abnormal findings: Secondary | ICD-10-CM

## 2012-06-24 DIAGNOSIS — R7989 Other specified abnormal findings of blood chemistry: Secondary | ICD-10-CM | POA: Insufficient documentation

## 2012-06-24 DIAGNOSIS — N289 Disorder of kidney and ureter, unspecified: Secondary | ICD-10-CM | POA: Insufficient documentation

## 2012-06-24 DIAGNOSIS — R6889 Other general symptoms and signs: Secondary | ICD-10-CM

## 2012-06-24 DIAGNOSIS — M7542 Impingement syndrome of left shoulder: Secondary | ICD-10-CM

## 2012-06-24 NOTE — Patient Instructions (Signed)
Please continue all other medications as before, including the anti-inflammatory twice per day as needed Please call if the left shoudler is worse, for an orthopedic referral Please continue all other medications as before, and refills have been done if requested. Please have the pharmacy call with any other refills you may need. Please continue your efforts at being more active, low cholesterol diet, and weight control. You are otherwise up to date with prevention measures today. Please keep your appointments with your specialists as you have planned - dermatology Please return in approx 1 month for the repeat blood tests (thyroid and kidney) You will be contacted by phone if any changes need to be made immediately.  Otherwise, you will receive a letter about your results with an explanation, but please check with MyChart first. Thank you for enrolling in MyChart. Please follow the instructions below to securely access your online medical record. MyChart allows you to send messages to your doctor, view your test results, renew your prescriptions, schedule appointments, and more.  Please return in 1 year for your yearly visit, or sooner if needed, with Lab testing done 3-5 days before

## 2012-06-24 NOTE — Telephone Encounter (Signed)
Requesting a referral to an orthopedic for his shoulder.

## 2012-06-24 NOTE — Progress Notes (Signed)
Subjective:    Patient ID: Thomas Caldwell, male    DOB: [birth year], 67 y.o.   MRN: [id number]  HPI    Here for wellness and f/u;  Overall doing ok;  Pt denies CP, worsening SOB, DOE, wheezing, orthopnea, PND, worsening LE edema, palpitations, dizziness or syncope.  Pt denies neurological change such as new headache, facial or extremity weakness.  Pt denies polydipsia, polyuria, or low sugar symptoms. Pt states overall good compliance with treatment and medications, good tolerability, and has been trying to follow lower cholesterol diet.  Pt denies worsening depressive symptoms, suicidal ideation or panic. No fever, night sweats, wt loss, loss of appetite, or other constitutional symptoms.  Pt states good ability with ADL's, has low fall risk, home safety reviewed and adequate, no other significant changes in hearing or vision, and only occasionally active with exercise.  Denies hyper or hypo thyroid symptoms such as voice, skin or hair change. Does also have pain to left shoulder, worse to lie on it at night, worse to abduct though can abduct fully. Past Medical History  Diagnosis Date  . ANEURYSM 02/02/2007  . BRADYCARDIA, CHRONIC 10/06/2007  . ALLERGIC RHINITIS 01/11/2008  . GERD 07/05/2007  . HYPERLIPIDEMIA 02/02/2007  . HYPERTENSION 02/02/2007  . 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 DIVERTICULOSIS, COLON 07/05/2007  . 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 Lumbago 01/11/2008   Past Surgical History  Procedure Laterality Date  . Appendectomy  1982  . Sah left brain  1992    due to brain aneurysm s/p repair  . Lumbar disc surgery  12/2005    Dr. Venetia Maxon  . Hernia repair      right    reports that he has never smoked. He does not have any smokeless tobacco history on file. He reports that  drinks alcohol. He reports that he does not use illicit drugs. family history includes Heart disease in his father and sister and Hypertension in his brother and father. Allergies   Allergen Reactions  . Codeine     REACTION: confusion   Current Outpatient Prescriptions on File Prior to Visit  Medication Sig Dispense Refill  . amLODipine (NORVASC) 5 MG tablet TAKE 1 TABLET BY MOUTH DAILY  90 tablet  2  . aspirin 81 MG EC tablet Take 81 mg by mouth daily.        Marland Kitchen atenolol (TENORMIN) 25 MG tablet TAKE 1 TABLET BY MOUTH DAILY  90 tablet  2  . HYDROcodone-homatropine (HYCODAN) 5-1.5 MG/5ML syrup Take 5 mLs by mouth every 6 (six) hours as needed for cough.  120 mL  1  . levofloxacin (LEVAQUIN) 250 MG tablet Take 1 tablet (250 mg total) by mouth daily.  10 tablet  0  . nabumetone (RELAFEN) 500 MG tablet TAKE 1 TABLET BY MOUTH TWICE DAILY  60 tablet  5  . nabumetone (RELAFEN) 500 MG tablet TAKE 1 TABLET BY MOUTH TWICE DAILY  60 tablet  0  . omeprazole (PRILOSEC) 20 MG capsule Take 1 capsule (20 mg total) by mouth daily.  90 capsule  2  . promethazine (PHENERGAN) 25 MG tablet Take 1 tablet (25 mg total) by mouth every 6 (six) hours as needed for nausea.  40 tablet  0  . simvastatin (ZOCOR) 40 MG tablet TAKE 1 TABLET BY MOUTH ONCE DAILY  90 tablet  3  . traZODone (DESYREL) 50 MG tablet Take 1 tablet (50 mg total) by mouth at bedtime.  90 tablet  1  . traZODone (DESYREL)  50 MG tablet Take 1 tablet (50 mg total) by mouth at bedtime.  90 tablet  5   No current facility-administered medications on file prior to visit.   Review of Systems Constitutional: Negative for diaphoresis, activity change, appetite change or unexpected weight change.  HENT: Negative for hearing loss, ear pain, facial swelling, mouth sores and neck stiffness.   Eyes: Negative for pain, redness and visual disturbance.  Respiratory: Negative for shortness of breath and wheezing.   Cardiovascular: Negative for chest pain and palpitations.  Gastrointestinal: Negative for diarrhea, blood in stool, abdominal distention or other pain Genitourinary: Negative for hematuria, flank pain or change in urine volume.   Musculoskeletal: Negative for myalgias and joint swelling.  Skin: Negative for color change and wound.  Neurological: Negative for syncope and numbness. other than noted Hematological: Negative for adenopathy.  Psychiatric/Behavioral: Negative for hallucinations, self-injury, decreased concentration and agitation.      Objective:   Physical Exam There were no vitals taken for this visit. VS noted,  Constitutional: Pt is oriented to person, place, and time. Appears well-developed and well-nourished.  Head: Normocephalic and atraumatic.  Right Ear: External ear normal.  Left Ear: External ear normal.  Nose: Nose normal.  Mouth/Throat: Oropharynx is clear and moist.  Eyes: Conjunctivae and EOM are normal. Pupils are equal, round, and reactive to light.  Neck: Normal range of motion. Neck supple. No JVD present. No tracheal deviation present.  Cardiovascular: Normal rate, regular rhythm, normal heart sounds and intact distal pulses.   Pulmonary/Chest: Effort normal and breath sounds normal.  Abdominal: Soft. Bowel sounds are normal. There is no tenderness. No HSM  Musculoskeletal: Normal range of motion. Exhibits no edema.  Lymphadenopathy:  Has no cervical adenopathy.  Neurological: Pt is alert and oriented to person, place, and time. Pt has normal reflexes. No cranial nerve deficit.  Skin: Skin is warm and dry. No rash noted.  Left shoulder tender subacromail area, pain to active abduction but full Psychiatric:  Has  normal mood and affect. Behavior is normal.     Assessment & Plan:

## 2012-06-24 NOTE — Assessment & Plan Note (Signed)

## 2012-06-24 NOTE — Telephone Encounter (Signed)
Referral done

## 2012-06-25 ENCOUNTER — Encounter: Payer: Self-pay | Admitting: Internal Medicine

## 2012-06-25 DIAGNOSIS — M25512 Pain in left shoulder: Secondary | ICD-10-CM | POA: Insufficient documentation

## 2012-06-25 NOTE — Assessment & Plan Note (Signed)
?   Clinical significance - for repeat lab 4 wks with free t4

## 2012-06-25 NOTE — Assessment & Plan Note (Addendum)
Mild to mod, for nsaid prn but watch renal fxn, consier ortho but declines at this time,  to f/u any worsening symptoms or concerns

## 2012-06-25 NOTE — Assessment & Plan Note (Signed)
With slight increased cr - for f/u lab next month

## 2012-06-27 ENCOUNTER — Other Ambulatory Visit (INDEPENDENT_AMBULATORY_CARE_PROVIDER_SITE_OTHER): Payer: BC Managed Care – PPO

## 2012-06-27 DIAGNOSIS — R6889 Other general symptoms and signs: Secondary | ICD-10-CM

## 2012-06-27 DIAGNOSIS — N289 Disorder of kidney and ureter, unspecified: Secondary | ICD-10-CM

## 2012-06-27 DIAGNOSIS — R7989 Other specified abnormal findings of blood chemistry: Secondary | ICD-10-CM

## 2012-06-27 LAB — TSH: TSH: 4.06 u[IU]/mL (ref 0.35–5.50)

## 2012-06-27 LAB — BASIC METABOLIC PANEL
BUN: 23 mg/dL (ref 6–23)
Chloride: 108 mEq/L (ref 96–112)
Potassium: 5.3 mEq/L — ABNORMAL HIGH (ref 3.5–5.1)

## 2012-09-18 ENCOUNTER — Other Ambulatory Visit: Payer: Self-pay | Admitting: Internal Medicine

## 2012-10-04 ENCOUNTER — Other Ambulatory Visit: Payer: Self-pay | Admitting: Internal Medicine

## 2012-10-04 NOTE — Telephone Encounter (Signed)
Done erx 

## 2012-10-25 ENCOUNTER — Encounter: Payer: Self-pay | Admitting: Family Medicine

## 2012-10-25 ENCOUNTER — Ambulatory Visit (INDEPENDENT_AMBULATORY_CARE_PROVIDER_SITE_OTHER)
Admission: RE | Admit: 2012-10-25 | Discharge: 2012-10-25 | Disposition: A | Discharge status: Home / Self Care | Payer: 59 | Source: Ambulatory Visit | Attending: Family Medicine | Admitting: Family Medicine

## 2012-10-25 ENCOUNTER — Ambulatory Visit (INDEPENDENT_AMBULATORY_CARE_PROVIDER_SITE_OTHER): Payer: 59 | Admitting: Family Medicine

## 2012-10-25 VITALS — BP 134/82 | HR 53

## 2012-10-25 DIAGNOSIS — M25532 Pain in left wrist: Secondary | ICD-10-CM

## 2012-10-25 DIAGNOSIS — S52599A Other fractures of lower end of unspecified radius, initial encounter for closed fracture: Secondary | ICD-10-CM

## 2012-10-25 DIAGNOSIS — M25539 Pain in unspecified wrist: Secondary | ICD-10-CM

## 2012-10-25 DIAGNOSIS — S52502A Unspecified fracture of the lower end of left radius, initial encounter for closed fracture: Secondary | ICD-10-CM

## 2012-10-25 NOTE — Patient Instructions (Signed)
Tylenol 650 mg three times a day no matter what for next 5 days.  Wear the brace at all times Wow, keep walking  Come back in 2 weeks and we will get xray Please come and 30 minutes early for your next appointment. Please go downstairs and had an x-ray. They will already had the order in. At follow up if knee still giving you trouble then will do injection.    Wrist Fracture A wrist fracture is a break in one of the bones of the wrist. Your wrist is made up of several small bones at the palm of your hand (carpal bones) and the two bones that make up your forearm (radius and ulna). The bones come together to form multiple large and small joints. The shape and design of these joints allow your wrist to bend and straighten, move side-to-side, and rotate, as in twisting your palm up or down. CAUSES  A fracture may occur in any of the bones in your wrist when enough force is applied to the wrist, such as when falling down onto an outstretched hand. Severe injuries may occur from a more forceful injury. SYMPTOMS Symptoms of wrist fractures include tenderness, bruising, and swelling. Additionally, the wrist may hang in an odd position or may be misshaped. DIAGNOSIS To diagnose a wrist fracture, your caregiver will physically examine your wrist. Your caregiver may also request an X-ray exam of your wrist. TREATMENT Treatment depends on many factors, including the nature and location of the fracture, your age, and your activity level. Treatment for wrist fracture can be nonsurgical or surgical. For nonsurgical treatment, a plaster cast or splint may be applied to your wrist if the bone is in a good position (aligned). The cast will stay on for about 6 weeks. If the alignment of your bone is not good, it may be necessary to realign (reduce) it. After the bone is reduced, a splint usually is placed on your wrist to allow for a small amount of normal swelling. After about 1 week, the splint is removed and a  cast is added. The cast is removed 2 or 3 weeks later, after the swelling goes down, causing the cast to loosen. Another cast is applied. This cast is removed after about another 2 or 3 weeks, for a total of 4 to 6 weeks of immobilization. Sometimes the position of the bone is so far out of place that surgery is required to apply a device to hold it together as it heals. If the bone cannot be reduced without cutting the skin around the bone (closed reduction), a cut (incision) is made to allow direct access to the bone to reduce it (open reduction). Depending on the fracture, there are a number of options for holding the bone in place while it heals, including a cast, metal pins, a plate and screws, and a device called an external fixator. With an external fixator, most of the hardware remains outside of the body. HOME CARE INSTRUCTIONS  To lessen swelling, keep your injured wrist elevated and move your fingers as much as possible.  Apply ice to your wrist for the first 1 to 2 days after you have been treated or as directed by your caregiver. Applying ice helps to reduce inflammation and pain.  Put ice in a plastic bag.  Place a towel between your skin and the bag.  Leave the ice on for 15 to 20 minutes at a time, every 2 hours while you are awake.  Do  not put pressure on any part of your cast or splint. It may break.  Use a plastic bag to protect your cast or splint from water while bathing or showering. Do not lower your cast or splint into water.  Only take over-the-counter or prescription medicines for pain as directed by your caregiver. SEEK IMMEDIATE MEDICAL CARE IF:   Your cast or splint gets damaged or breaks.  You have continued severe pain or more swelling than you did before the cast was put on.  Your skin or fingernails below the injury turn blue or gray or feel cold or numb.  You develop decreased feeling in your fingers. MAKE SURE YOU:  Understand these  instructions.  Will watch your condition.  Will get help right away if you are not doing well or get worse. Document Released: 10/22/2004 Document Revised: 04/06/2011 Document Reviewed: 01/30/2011 [facility] Patient Information 2014 [place], [place].

## 2012-10-25 NOTE — Assessment & Plan Note (Signed)
Patient placed in a moldable cast today. Patient declined any pain medications. Discussed icing protocol Handout given I would like patient to followup again in 2 weeks for further evaluation. We will get x-rays at that time.

## 2012-10-25 NOTE — Progress Notes (Signed)
  CC: Left wrist pain  HPI: SUBJECTIVE: Thomas Caldwell is a 67 y.o. male who sustained a left wrist injury 1 day(s) ago. Mechanism of injury: patient fell and tried to catch himself. Immediate symptoms: immediate pain, inability to use hand directly after injury, no deformity was noted by the patient. Symptoms have been constant since that time. Prior history of related problems: no prior problems with this area in the past. Patient describes the pain as a dull aching sensation that can have sharp pain at the base of the thumb with certain movements. Has not done anything to help the pain at this point in places the severity of 5/10   Past medical, surgical, family and social history reviewed. Medications reviewed all in the electronic medical record.   Review of Systems: No headache, visual changes, nausea, vomiting, diarrhea, constipation, dizziness, abdominal pain, skin rash, fevers, chills, night sweats, weight loss, swollen lymph nodes, body aches, joint swelling, muscle aches, chest pain, shortness of breath, mood changes.   Objective:    Blood pressure 134/82, pulse 53, SpO2 96.00%.   General: No apparent distress alert and oriented x3 mood and affect normal, dressed appropriately.  HEENT: Pupils equal, extraocular movements intact Respiratory: Patient's speak in full sentences and does not appear short of breath Cardiovascular: No lower extremity edema, non tender, no erythema Skin: Warm dry intact with no signs of infection or rash on extremities or on axial skeleton. Abdomen: Soft nontender Neuro: Cranial nerves II through XII are intact, neurovascularly intact in all extremities with 2+ DTRs and 2+ pulses. Lymph: No lymphadenopathy of posterior or anterior cervical chain or axillae bilaterally.  Gait normal with good balance and coordination.  MSK: Non tender with full range of motion and good stability and symmetric strength and tone of shoulders, elbows, hip, knee and ankles  bilaterally.  Wrist: Inspection shows trace swelling of the left dorsal wrist compared to the contralateral side Range of motion is decreased and extension but otherwise near full range of motion compared to the contralateral side Palpation is normal over metacarpals, navicular, lunate, and TFCC; tendons without tenderness/ swelling Mild snuffbox tenderness. No tenderness over Canal of Guyon. Strength 3/5 with extension otherwise 5 out of 5 Negative Watson's test. Patient is tender to palpation over the distal radius as well as the snuffbox as stated before. He is neurovascularly intact distally.  X-rays were ordered for the and interpreted by me today. X-rays of the left wrist does not show any bony abnormality at this point. 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 In addition this patient may have a distal radius fracture but very small nondisplaced distal radius fracture  Procedure note- patient was placed in an excellent brace that was formed sitting.  Impression and Recommendations:     This case required medical decision making of moderate complexity.

## 2012-11-08 ENCOUNTER — Ambulatory Visit (INDEPENDENT_AMBULATORY_CARE_PROVIDER_SITE_OTHER): Payer: 59 | Admitting: Family Medicine

## 2012-11-08 ENCOUNTER — Ambulatory Visit: Payer: 59 | Admitting: Internal Medicine

## 2012-11-08 ENCOUNTER — Encounter: Payer: Self-pay | Admitting: Family Medicine

## 2012-11-08 ENCOUNTER — Ambulatory Visit (INDEPENDENT_AMBULATORY_CARE_PROVIDER_SITE_OTHER)
Admission: RE | Admit: 2012-11-08 | Discharge: 2012-11-08 | Disposition: A | Discharge status: Home / Self Care | Payer: 59 | Source: Ambulatory Visit | Attending: Family Medicine | Admitting: Family Medicine

## 2012-11-08 VITALS — BP 140/84 | HR 44

## 2012-11-08 DIAGNOSIS — M25569 Pain in unspecified knee: Secondary | ICD-10-CM

## 2012-11-08 DIAGNOSIS — M25539 Pain in unspecified wrist: Secondary | ICD-10-CM

## 2012-11-08 DIAGNOSIS — M25532 Pain in left wrist: Secondary | ICD-10-CM

## 2012-11-08 DIAGNOSIS — S5290XD Unspecified fracture of unspecified forearm, subsequent encounter for closed fracture with routine healing: Secondary | ICD-10-CM

## 2012-11-08 DIAGNOSIS — M25561 Pain in right knee: Secondary | ICD-10-CM | POA: Insufficient documentation

## 2012-11-08 DIAGNOSIS — S52502D Unspecified fracture of the lower end of left radius, subsequent encounter for closed fracture with routine healing: Secondary | ICD-10-CM

## 2012-11-08 NOTE — Assessment & Plan Note (Signed)
Healed at this time. Patient given home exercises to increase range of motion And discuss wearing brace toe with activity such as lifting for the next 2 weeks. Patient will come back on an as-needed basis.

## 2012-11-08 NOTE — Assessment & Plan Note (Signed)
Discussed with patient in multiple different treatment options and prognosis. Patient did have a steroid injection today. Patient was fitted with a knee brace by me today. Patient given home exercise program as well. Encourage patient to lose some weight. Patient will followup in 4 weeks. Continuing to have pain we will get x-rays in patient with likely be a candidate for viscous supplementation.

## 2012-11-08 NOTE — Patient Instructions (Signed)
Great to see you again Your wrist is mostly heal, try doing exercises daily Your knee has some arthritis and I didi an injeciton today  Take tylenol 650 mg three times a day is the best evidence based medicine we have for arthritis.  Aleve 1-2 tabs twice a day with food or ibuprofen 600mg  twice daily with food can be added to tylenol.  Glucosamine sulfate 750mg  twice a day is a supplement that has been shown to help moderate to severe arthritis. Capsaicin topically up to four times a day may also help with pain. Fish oil 3 grams daily Vitamin D 1000IU daily.  It's important that you continue to stay active. Controlling your weight is important.  Consider physical therapy to strengthen muscles around the joint that hurts to take pressure off of the joint itself. Shoe inserts with good arch support may be helpful. Heat or ice 20 minutes at a time 3-4 times a day as needed to help with pain. Water aerobics and cycling with low resistance are the best two types of exercise for arthritis. Come back and see me if needed.

## 2012-11-08 NOTE — Progress Notes (Signed)
  CC: Left wrist pain  HPI: SUBJECTIVE: Thomas Caldwell is a 67 y.o. male who is here for followup of his left wrist injury. 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 This was nondisplaced. Patient was put into a exos brace. Patient states since that time patient has been doing much better   Past medical, surgical, family and social history reviewed. Medications reviewed all in the electronic medical record.   Review of Systems: No headache, visual changes, nausea, vomiting, diarrhea, constipation, dizziness, abdominal pain, skin rash, fevers, chills, night sweats, weight loss, swollen lymph nodes, body aches, joint swelling, muscle aches, chest pain, shortness of breath, mood changes.   Objective:    Blood pressure 140/84, pulse 44, SpO2 96.00%.   General: No apparent distress alert and oriented x3 mood and affect normal, dressed appropriately.  HEENT: Pupils equal, extraocular movements intact Respiratory: Patient's speak in full sentences and does not appear short of breath Cardiovascular: No lower extremity edema, non tender, no erythema Skin: Warm dry intact with no signs of infection or rash on extremities or on axial skeleton. Abdomen: Soft nontender Neuro: Cranial nerves II through XII are intact, neurovascularly intact in all extremities with 2+ DTRs and 2+ pulses. Lymph: No lymphadenopathy of posterior or anterior cervical chain or axillae bilaterally.  Gait normal with good balance and coordination.  MSK: Non tender with full range of motion and good stability and symmetric strength and tone of shoulders, elbows, hip, knee and ankles bilaterally.  Wrist: Left Inspection shows no swelling Range of motion is decreased and extension but otherwise near full range of motion compared to the contralateral side Palpation is normal over metacarpals, navicular, lunate, and TFCC; tendons without tenderness/ swelling Negative snuffbox  tenderness. No tenderness over Canal of Guyon. Strength 4/5 with extension otherwise 5 out of 5 Negative Watson's test. Patient is nontender Contralateral side unremarkable Knee: Right Normal to inspection with no erythema or effusion or obvious bony abnormalities. Tender to palpation of the medial joint line ROM full in flexion and extension and lower leg rotation. Ligaments with solid consistent endpoints including ACL, PCL, LCL, MCL. Negative Mcmurray's, Apley's, and Thessalonian tests. Mild painful patellar compression. Patellar glide without crepitus. Patellar and quadriceps tendons unremarkable. Hamstring and quadriceps strength is normal.   Procedure note After informed written and verbal consent, patient was seated on exam table. Right knee was prepped with alcohol swab and utilizing anterolateral approach, patient's right knee space was injected with 2:2:1  Lidocaine 1% :marcaine 0.5%: Kenalog 40mg /dL. Patient tolerated the procedure well without immediate complications.   X-rays were ordered for the and interpreted by me today. X x-rays show that patient has healed very well from his injury. Still with same [facility] joint arthritis   Impression and Recommendations:     This case required medical decision making of moderate complexity.

## 2012-12-01 ENCOUNTER — Other Ambulatory Visit: Payer: Self-pay

## 2013-01-02 ENCOUNTER — Ambulatory Visit (INDEPENDENT_AMBULATORY_CARE_PROVIDER_SITE_OTHER): Payer: 59 | Admitting: Family Medicine

## 2013-01-02 ENCOUNTER — Ambulatory Visit (INDEPENDENT_AMBULATORY_CARE_PROVIDER_SITE_OTHER)
Admission: RE | Admit: 2013-01-02 | Discharge: 2013-01-02 | Disposition: A | Discharge status: Home / Self Care | Payer: 59 | Source: Ambulatory Visit | Attending: Family Medicine | Admitting: Family Medicine

## 2013-01-02 ENCOUNTER — Telehealth: Payer: Self-pay | Admitting: Family Medicine

## 2013-01-02 ENCOUNTER — Encounter: Payer: Self-pay | Admitting: Family Medicine

## 2013-01-02 VITALS — BP 138/84 | HR 57 | Wt 212.0 lb

## 2013-01-02 DIAGNOSIS — Z23 Encounter for immunization: Secondary | ICD-10-CM

## 2013-01-02 DIAGNOSIS — M79604 Pain in right leg: Secondary | ICD-10-CM

## 2013-01-02 DIAGNOSIS — M25569 Pain in unspecified knee: Secondary | ICD-10-CM

## 2013-01-02 DIAGNOSIS — M25561 Pain in right knee: Secondary | ICD-10-CM

## 2013-01-02 NOTE — Telephone Encounter (Signed)
Called patient back regarding x-rays. Patient does have a irregularity of the lateral aspect of the right femur that could be a osteochondroma. The discussed with patient about possibly further workup. Patient does not give any significant history of large, on the lateral aspect of his leg. Patient does state that he has some numbness on the side but that's not out of character. Patient's main pain is on the medial aspect of the knee.  Patient will call back if he would like to do further imaging. If so I would like to order a MRI with and without contrast of the distal femur to rule out osteosarcoma as well as a MRi without contrast  right knee ruling out  meniscal injury.

## 2013-01-02 NOTE — Progress Notes (Signed)
  CC: follow up right knee pain.   HPI: SUBJECTIVE: Thomas Caldwell is a 67 y.o. male who  Is here for his right knee pain. Patient was found to have osteophytic changes on ultrasound previously. Patient was given an injection more than 6 weeks ago. Patient states that he is doing very well until the last 2 weeks when the pain seemed to be coming back on. Patient states he has been walking on average 16,000 steps daily. Patient states he knows this is not helpful. Patient recently did get new over-the-counter orthotics for his shoes which she will see if makes a difference. Patient continues to have medial knee pain is worse with ambulation. Describes it more as a dull throbbing sensation. The pain seems to be helping significantly. Patient is wearing a brace which does make somewhat of a difference   Past medical, surgical, family and social history reviewed. Medications reviewed all in the electronic medical record.   Review of Systems: No headache, visual changes, nausea, vomiting, diarrhea, constipation, dizziness, abdominal pain, skin rash, fevers, chills, night sweats, weight loss, swollen lymph nodes, body aches, joint swelling, muscle aches, chest pain, shortness of breath, mood changes.   Objective:    Blood pressure 138/84, pulse 57, weight 212 lb (96.163 kg), SpO2 96.00%.   General: No apparent distress alert and oriented x3 mood and affect normal, dressed appropriately.  HEENT: Pupils equal, extraocular movements intact Respiratory: Patient's speak in full sentences and does not appear short of breath Cardiovascular: No lower extremity edema, non tender, no erythema Skin: Warm dry intact with no signs of infection or rash on extremities or on axial skeleton. Abdomen: Soft nontender Neuro: Cranial nerves II through XII are intact, neurovascularly intact in all extremities with 2+ DTRs and 2+ pulses. Lymph: No lymphadenopathy of posterior or anterior cervical chain or axillae  bilaterally.  Gait normal with good balance and coordination.  MSK: Non tender with full range of motion and good stability and symmetric strength and tone of shoulders, elbows, hip, knee and ankles bilaterally.  Knee: Right Normal to inspection with no erythema or effusion or obvious bony abnormalities. Tender to palpation of the medial joint line ROM full in flexion and extension and lower leg rotation. Ligaments with solid consistent endpoints including ACL, PCL, LCL, MCL. Negative Mcmurray's, Apley's, and Thessalonian tests. Mild painful patellar compression. Patellar glide without crepitus. Patellar and quadriceps tendons unremarkable. Hamstring and quadriceps strength is normal.   Procedure note After informed written and verbal consent, patient was seated on exam table. Right knee was prepped with alcohol swab and utilizing anterolateral approach, patient's right knee space was injected with 2:2:1  Lidocaine 1% :marcaine 0.5%: Kenalog 40mg /dL. Patient tolerated the procedure well without immediate complications.     Impression and Recommendations:     This case required medical decision making of moderate complexity.

## 2013-01-02 NOTE — Patient Instructions (Signed)
Good to se eyou We will try another injection today Continue brace and exercises  Try the insoles Come back again in 3-4 weeks. If you want to have orthotics or the viscous supplementation please call first so I can get it ready.

## 2013-01-02 NOTE — Assessment & Plan Note (Addendum)
Patient had another injection today. Discuss continuing home exercises as well as bracing with long walks. Patient will try the orthotics which I think will be beneficial. In addition patient will get x-rays of the knee. I would like to see patient back again in 4 weeks. He may be a candidate for viscous supplementation. Patient will call and have this scheduled if necessary.

## 2013-01-03 NOTE — Addendum Note (Signed)
Addended by: Edwena Felty T on: 01/03/2013 08:50 AM   Modules accepted: Orders

## 2013-01-03 NOTE — Telephone Encounter (Signed)
Orders entered

## 2013-01-16 ENCOUNTER — Ambulatory Visit
Admission: RE | Admit: 2013-01-16 | Discharge: 2013-01-16 | Disposition: A | Discharge status: Home / Self Care | Payer: 59 | Source: Ambulatory Visit | Attending: Family Medicine | Admitting: Family Medicine

## 2013-01-16 DIAGNOSIS — M79604 Pain in right leg: Secondary | ICD-10-CM

## 2013-01-16 MED ORDER — GADOBENATE DIMEGLUMINE 529 MG/ML IV SOLN
20.0000 mL | Freq: Once | INTRAVENOUS | Status: AC | PRN
  Administered 2013-01-16: 20 mL via INTRAVENOUS

## 2013-02-13 ENCOUNTER — Other Ambulatory Visit: Payer: 59

## 2013-02-13 ENCOUNTER — Encounter: Payer: Self-pay | Admitting: Internal Medicine

## 2013-02-13 ENCOUNTER — Other Ambulatory Visit (INDEPENDENT_AMBULATORY_CARE_PROVIDER_SITE_OTHER): Payer: 59

## 2013-02-13 ENCOUNTER — Ambulatory Visit (INDEPENDENT_AMBULATORY_CARE_PROVIDER_SITE_OTHER): Payer: 59 | Admitting: Internal Medicine

## 2013-02-13 VITALS — BP 138/78 | HR 84 | Temp 98.5°F | Resp 16 | Ht 72.0 in | Wt 204.0 lb

## 2013-02-13 DIAGNOSIS — K529 Noninfective gastroenteritis and colitis, unspecified: Secondary | ICD-10-CM | POA: Insufficient documentation

## 2013-02-13 LAB — CBC WITH DIFFERENTIAL/PLATELET
Basophils Absolute: 0 10*3/uL (ref 0.0–0.1)
Basophils Relative: 0.3 % (ref 0.0–3.0)
EOS PCT: 0.6 % (ref 0.0–5.0)
Eosinophils Absolute: 0 10*3/uL (ref 0.0–0.7)
HCT: 45 % (ref 39.0–52.0)
HEMOGLOBIN: 15.4 g/dL (ref 13.0–17.0)
LYMPHS ABS: 1 10*3/uL (ref 0.7–4.0)
Lymphocytes Relative: 14.9 % (ref 12.0–46.0)
MCHC: 34.2 g/dL (ref 30.0–36.0)
MCV: 87.5 fl (ref 78.0–100.0)
MONOS PCT: 12.1 % — AB (ref 3.0–12.0)
Monocytes Absolute: 0.8 10*3/uL (ref 0.1–1.0)
NEUTROS ABS: 4.8 10*3/uL (ref 1.4–7.7)
Neutrophils Relative %: 72.1 % (ref 43.0–77.0)
PLATELETS: 230 10*3/uL (ref 150.0–400.0)
RBC: 5.14 Mil/uL (ref 4.22–5.81)
RDW: 14.2 % (ref 11.5–14.6)
WBC: 6.7 10*3/uL (ref 4.5–10.5)

## 2013-02-13 MED ORDER — PROMETHAZINE HCL 25 MG PO TABS: 25.0000 mg | ORAL_TABLET | Freq: Four times a day (QID) | ORAL | Status: DC | PRN

## 2013-02-13 NOTE — Assessment & Plan Note (Signed)
His CBC is normal  I do no think he has c diff infection or a bacterial infection so antibiotics were not recommended I think this is viral and will treat the symptoms with phenergan

## 2013-02-13 NOTE — Progress Notes (Signed)
Subjective:    Patient ID: Thomas Caldwell, male    DOB: [birth year], 68 y.o.   MRN: [id number]  Diarrhea  This is a new problem. Episode onset: 3 days  The problem occurs 5 to 10 times per day. The problem has been unchanged. The stool consistency is described as watery. The patient states that diarrhea does not awaken him from sleep. Associated symptoms include chills and myalgias. Pertinent negatives include no abdominal pain, arthralgias, bloating, coughing, fever, headaches, increased  flatus, sweats, URI, vomiting or weight loss. There are no known risk factors. He has tried nothing for the symptoms. The treatment provided no relief.      Review of Systems  Constitutional: Positive for chills. Negative for fever, weight loss, diaphoresis, activity change, appetite change, fatigue and unexpected weight change.  HENT: Negative.   Eyes: Negative.   Respiratory: Negative.  Negative for cough, shortness of breath, wheezing and stridor.   Cardiovascular: Negative.  Negative for chest pain, palpitations and leg swelling.  Gastrointestinal: Positive for nausea and diarrhea. Negative for vomiting, abdominal pain, constipation, blood in stool, abdominal distention, anal bleeding, rectal pain, bloating and flatus.  Endocrine: Negative.   Genitourinary: Negative.   Musculoskeletal: Positive for myalgias. Negative for arthralgias, neck pain and neck stiffness.  Skin: Negative.   Allergic/Immunologic: Negative.   Neurological: Negative.  Negative for dizziness, tremors, syncope, weakness, light-headedness and headaches.  Hematological: Negative.  Negative for adenopathy. Does not bruise/bleed easily.  Psychiatric/Behavioral: Negative.        Objective:   Physical Exam  Vitals reviewed. Constitutional: He is oriented to person, place, and time. He appears well-developed and well-nourished.  Non-toxic appearance. He does not have a sickly appearance. He does not appear ill. No distress.    HENT:  Head: Normocephalic and atraumatic.  Mouth/Throat: Oropharynx is clear and moist. No oropharyngeal exudate.  Eyes: Conjunctivae are normal. Right eye exhibits no discharge. Left eye exhibits no discharge. No scleral icterus.  Neck: Normal range of motion. Neck supple. No JVD present. No tracheal deviation present. No thyromegaly present.  Cardiovascular: Normal rate, regular rhythm, normal heart sounds and intact distal pulses.  Exam reveals no gallop and no friction rub.   No murmur heard. Pulmonary/Chest: Effort normal and breath sounds normal. No stridor. No respiratory distress. He has no wheezes. He has no rales. He exhibits no tenderness.  Abdominal: Soft. Bowel sounds are normal. He exhibits no distension and no mass. There is no tenderness. There is no rebound and no guarding.  Musculoskeletal: Normal range of motion. He exhibits no edema and no tenderness.  Lymphadenopathy:    He has no cervical adenopathy.  Neurological: He is oriented to person, place, and time.  Skin: Skin is warm and dry. No rash noted. He is not diaphoretic. No erythema. No pallor.  Psychiatric: He has a normal mood and affect. His behavior is normal. Judgment and thought content normal.     Lab Results  Component Value Date   WBC 6.3 06/16/2012   HGB 15.4 06/16/2012   HCT 45.1 06/16/2012   PLT 181.0 06/16/2012   GLUCOSE 102* 06/27/2012   CHOL 163 06/16/2012   TRIG 116.0 06/16/2012   HDL 41.80 06/16/2012   LDLCALC 98 06/16/2012   ALT 28 06/16/2012   AST 27 06/16/2012   NA 140 06/27/2012   K 5.3* 06/27/2012   CL 108 06/27/2012   CREATININE 1.2 06/27/2012   BUN 23 06/27/2012   CO2 24 06/27/2012   TSH 4.06 06/27/2012  PSA 1.45 06/16/2012       Assessment & Plan:

## 2013-02-13 NOTE — Progress Notes (Signed)
Pre visit review using our clinic review tool, if applicable. No additional management support is needed unless otherwise documented below in the visit note. 

## 2013-02-13 NOTE — Patient Instructions (Signed)
Viral Gastroenteritis Viral gastroenteritis is also known as stomach flu. This condition affects the stomach and intestinal tract. It can cause sudden diarrhea and vomiting. The illness typically lasts 3 to 8 days. Most people develop an immune response that eventually gets rid of the virus. While this natural response develops, the virus can make you quite ill. CAUSES  Many different viruses can cause gastroenteritis, such as rotavirus or noroviruses. You can catch one of these viruses by consuming contaminated food or water. You may also catch a virus by sharing utensils or other personal items with an infected person or by touching a contaminated surface. SYMPTOMS  The most common symptoms are diarrhea and vomiting. These problems can cause a severe loss of body fluids (dehydration) and a body salt (electrolyte) imbalance. Other symptoms may include:  Fever.  Headache.  Fatigue.  Abdominal pain. DIAGNOSIS  Your caregiver can usually diagnose viral gastroenteritis based on your symptoms and a physical exam. A stool sample may also be taken to test for the presence of viruses or other infections. TREATMENT  This illness typically goes away on its own. Treatments are aimed at rehydration. The most serious cases of viral gastroenteritis involve vomiting so severely that you are not able to keep fluids down. In these cases, fluids must be given through an intravenous line (IV). HOME CARE INSTRUCTIONS   Drink enough fluids to keep your urine clear or pale yellow. Drink small amounts of fluids frequently and increase the amounts as tolerated.  Ask your caregiver for specific rehydration instructions.  Avoid:  Foods high in sugar.  Alcohol.  Carbonated drinks.  Tobacco.  Juice.  Caffeine drinks.  Extremely hot or cold fluids.  Fatty, greasy foods.  Too much intake of anything at one time.  Dairy products until 24 to 48 hours after diarrhea stops.  You may consume probiotics.  Probiotics are active cultures of beneficial bacteria. They may lessen the amount and number of diarrheal stools in adults. Probiotics can be found in yogurt with active cultures and in supplements.  Wash your hands well to avoid spreading the virus.  Only take over-the-counter or prescription medicines for pain, discomfort, or fever as directed by your caregiver. Do not give aspirin to children. Antidiarrheal medicines are not recommended.  Ask your caregiver if you should continue to take your regular prescribed and over-the-counter medicines.  Keep all follow-up appointments as directed by your caregiver. SEEK IMMEDIATE MEDICAL CARE IF:   You are unable to keep fluids down.  You do not urinate at least once every 6 to 8 hours.  You develop shortness of breath.  You notice blood in your stool or vomit. This may look like coffee grounds.  You have abdominal pain that increases or is concentrated in one small area (localized).  You have persistent vomiting or diarrhea.  You have a fever.  The patient is a child younger than 3 months, and he or she has a fever.  The patient is a child older than 3 months, and he or she has a fever and persistent symptoms.  The patient is a child older than 3 months, and he or she has a fever and symptoms suddenly get worse.  The patient is a baby, and he or she has no tears when crying. MAKE SURE YOU:   Understand these instructions.  Will watch your condition.  Will get help right away if you are not doing well or get worse. Document Released: 01/12/2005 Document Revised: 04/06/2011 Document Reviewed: 10/29/2010   [employer] Patient Information 2014 [employer]  

## 2013-02-15 ENCOUNTER — Other Ambulatory Visit: Payer: Self-pay | Admitting: Internal Medicine

## 2013-02-22 ENCOUNTER — Other Ambulatory Visit: Payer: Self-pay | Admitting: Internal Medicine

## 2013-03-22 ENCOUNTER — Other Ambulatory Visit (INDEPENDENT_AMBULATORY_CARE_PROVIDER_SITE_OTHER): Payer: 59

## 2013-03-22 ENCOUNTER — Ambulatory Visit (INDEPENDENT_AMBULATORY_CARE_PROVIDER_SITE_OTHER): Payer: 59 | Admitting: Family Medicine

## 2013-03-22 ENCOUNTER — Encounter: Payer: Self-pay | Admitting: Family Medicine

## 2013-03-22 VITALS — BP 122/72 | HR 52 | Temp 98.2°F | Resp 16 | Wt 203.1 lb

## 2013-03-22 DIAGNOSIS — M79609 Pain in unspecified limb: Secondary | ICD-10-CM

## 2013-03-22 DIAGNOSIS — M76821 Posterior tibial tendinitis, right leg: Secondary | ICD-10-CM

## 2013-03-22 DIAGNOSIS — M79671 Pain in right foot: Secondary | ICD-10-CM

## 2013-03-22 DIAGNOSIS — M76829 Posterior tibial tendinitis, unspecified leg: Secondary | ICD-10-CM

## 2013-03-22 NOTE — Assessment & Plan Note (Signed)
Patient likely had subluxation of the posterior tibialis tendon that does have some mild tearing now. Patient was given a lace up ankle brace was fitted by me today. Patient will do an icing protocol and given home exercise program. Patient will come back and see me again in 3 weeks for further evaluation.

## 2013-03-22 NOTE — Progress Notes (Signed)
Pre visit review using our clinic review tool, if applicable. No additional management support is needed unless otherwise documented below in the visit note. 

## 2013-03-22 NOTE — Patient Instructions (Addendum)
Good to see you Try exercises most days of the week.  Ice 10 minutes 2 times a day Wear brace daily for 1 week then only with a lot of walking or exercise.  Wear heel lifts [employer] [facility] [facility] [employer] [facility] or online can help as well.  Iron 325 mg daily.  Come back again in 2-3 weeks.   Posterior Tibial Tendon Tendinitis with Rehab Tendonitis is a condition that is characterized by inflammation of a tendon or the lining (sheath) that surrounds it. The inflammation is usually caused by damage to the tendon, such as a tendon tear (strain). Sprains are classified into three categories. Grade 1 sprains cause pain, but the tendon is not lengthened. Grade 2 sprains include a lengthened ligament due to the ligament being stretched or partially ruptured. With grade 2 sprains there is still function, although the function may be diminished. Grade 3 sprains are characterized by a complete tear of the tendon or muscle, and function is usually impaired. Posterior tibialis tendonitis is tendonitis of the posterior tibial tendon, which attaches muscles of the lower leg to the foot. The posterior tibial tendon is located in the back of the ankle and helps the body straighten (plantarflex) and rotate inward (medially rotate) the ankle. SYMPTOMS   Pain, tenderness, swelling, warmth, and/or redness over the back of the inner ankle at the posterior tibial tendon or the inner part of the mid-foot.  Pain that worsens with plantarflexion or medial rotation of the ankle.  A crackling sound (crepitation) when the tendon is moved or touched. CAUSES  Posterior tibial tendonitis occurs when damage to the posterior tibial tendon starts an inflammatory response. Common mechanisms of injury include:  Degenerative (occurs with aging) processes that weaken the tendon and make it more susceptible to injury.  Stress placed on the tendon from an increase in the intensity, frequency, or duration of training.  Direct trauma to  the ankle.  Returning to activity before a previous ankle injury is allowed to heal. RISK INCREASES WITH:  Activities that involve repetitive and/or stressful plantarflexion (jumping, kicking, or running up/down hills).  Poor strength and flexibility.  Flat feet.  Previous injury to the foot, ankle, or leg. PREVENTION   Warm up and stretch properly before activity.  Allow for adequate recovery between workouts.  Maintain physical fitness:  Strength, flexibility, and endurance.  Cardiovascular fitness.  Learn and use proper technique. When possible, have a coach correct improper technique.  Complete rehabilitation from a previous foot, ankle, or leg injury.  If you have flat feet, wear arch supports (orthotics). PROGNOSIS  If treated properly, then the symptoms of tendonitis usually resolve within 6 weeks. This period may be shorter for injuries caused by direct trauma. RELATED COMPLICATIONS   Prolonged healing time, if improperly treated or re-injured.  Recurrent symptoms that result in a chronic problem.  Partial or complete tendon tear (rupture) requiring surgery. TREATMENT  Treatment initially involves the use of ice and medication to help reduce pain and inflammation. The use of strengthening and stretching exercises may help reduce pain with activity. These exercises may be performed at home or with referral to a therapist. Often times, your caregiver will recommend immobilizing the ankle to allow the tendon to heal. If you have flat feet, the you may be advised to wear orthotic arch supports. If symptoms persist for greater than 6 months despite non-surgical (conservative) treatment, then surgery may be recommended. MEDICATION   If pain medication is necessary, then nonsteroidal anti-inflammatory medications, such  as aspirin and ibuprofen, or other minor pain relievers, such as acetaminophen, are often recommended.  Do not take pain medication for 7 days before  surgery.  Prescription pain relievers may be given if deemed necessary by your caregiver. Use only as directed and only as much as you need.  Corticosteroid injections may be given by your caregiver. These injections should be reserved for the most serious cases, because they may only be given a certain number of times. HEAT AND COLD  Cold treatment (icing) relieves pain and reduces inflammation. Cold treatment should be applied for 10 to 15 minutes every 2 to 3 hours for inflammation and pain and immediately after any activity that aggravates your symptoms. Use ice packs or massage the area with a piece of ice (ice massage).  Heat treatment may be used prior to performing the stretching and strengthening activities prescribed by your caregiver, physical therapist, or athletic trainer. Use a heat pack or soak the injury in warm water. SEEK MEDICAL CARE IF:  Treatment seems to offer no benefit, or the condition worsens.  Any medications produce adverse side effects. EXERCISES RANGE OF MOTION (ROM) AND STRETCHING EXERCISES - Posterior Tibial Tendon Tendinitis These exercises may help you when beginning to rehabilitate your injury. Your symptoms may resolve with or without further involvement from your physician, physical therapist or athletic trainer. While completing these exercises, remember:   Restoring tissue flexibility helps normal motion to return to the joints. This allows healthier, less painful movement and activity.  An effective stretch should be held for at least 30 seconds.  A stretch should never be painful. You should only feel a gentle lengthening or release in the stretched tissue. RANGE OF MOTION - Ankle Plantar Flexion   Sit with your right / left leg crossed over your opposite knee.  Use your opposite hand to pull the top of your foot and toes toward you.  You should feel a gentle stretch on the top of your foot/ankle. Hold this position for __________  seconds. Repeat __________ times. Complete this exercise __________ times per day.  RANGE OF MOTION - Ankle Eversion   Sit with your right / left ankle crossed over your opposite knee.  Grip your foot with your opposite hand, placing your thumb on the top of your foot and your fingers across the bottom of your foot.  Gently push your foot downward with a slight rotation so your littlest toes rise slightly  You should feel a gentle stretch on the inside of your ankle. Hold the stretch for __________ seconds. Repeat __________ times. Complete this exercise __________ times per day.  RANGE OF MOTION - Ankle Inversion   Sit with your right / left ankle crossed over your opposite knee.  Grip your foot with your opposite hand, placing your thumb on the bottom of your foot and your fingers across the top of your foot.  Gently pull your foot so the smallest toe comes toward you and your thumb pushes the inside of the ball of your foot away from you.  You should feel a gentle stretch on the outside of your ankle. Hold the stretch for __________ seconds. Repeat __________ times. Complete this exercise __________ times per day.  RANGE OF MOTION - Dorsi/Plantar Flexion  While sitting with your right / left knee straight, draw the top of your foot upwards by flexing your ankle. Then reverse the motion, pointing your toes downward.  Hold each position for __________ seconds.  After completing your first  set of exercises, repeat this exercise with your knee bent. Repeat __________ times. Complete this exercise __________ times per day.  RANGE OF MOTION - Ankle Alphabet  Imagine your right / left big toe is a pen.  Keeping your hip and knee still, write out the entire alphabet with your "pen." Make the letters as large as you can without increasing any discomfort. Repeat __________ times. Complete this exercise __________ times per day.  STRETCH - Gastrocsoleus   Sit with your right / left leg  extended. Holding onto both ends of a belt or towel, loop it around the ball of your foot.  Keeping your right / left ankle and foot relaxed and your knee straight, pull your foot and ankle toward you using the belt/towel.  You should feel a gentle stretch behind your calf or knee. Hold this position for __________ seconds. Repeat __________ times. Complete this exercise __________ times per day.  STRETCH  Gastroc, Standing   Place hands on wall.  Extend right / left leg, keeping the front knee somewhat bent.  Slightly point your toes inward on your back foot.  Keeping your right / left heel on the floor and your knee straight, shift your weight toward the wall, not allowing your back to arch.  You should feel a gentle stretch in the right / left calf. Hold this position for __________ seconds. Repeat __________ times. Complete this stretch __________ times per day. STRETCH  Soleus, Standing   Place hands on wall.  Extend right / left leg, keeping the other knee somewhat bent.  Slightly point your toes inward on your back foot.  Keep your right / left heel on the floor, bend your back knee, and slightly shift your weight over the back leg so that you feel a gentle stretch deep in your back calf.  Hold this position for __________ seconds. Repeat __________ times. Complete this stretch __________ times per day. STRENGTHENING EXERCISES - Posterior Tibial Tendon Tendinitis These exercises may help you when beginning to rehabilitate your injury. They may resolve your symptoms with or without further involvement from your physician, physical therapist or athletic trainer. While completing these exercises, remember:   Muscles can gain both the endurance and the strength needed for everyday activities through controlled exercises.  Complete these exercises as instructed by your physician, physical therapist or athletic trainer. Progress the resistance and repetitions only as  guided. STRENGTH - Dorsiflexors  Secure a rubber exercise band/tubing to a fixed object (ie. table, pole) and loop the other end around your right / left foot.  Sit on the floor facing the fixed object. The band/tubing should be slightly tense when your foot is relaxed.  Slowly draw your foot back toward you using your ankle and toes.  Hold this position for __________ seconds. Slowly release the tension in the band and return your foot to the starting position. Repeat __________ times. Complete this exercise __________ times per day.  STRENGTH - Towel Curls  Sit in a chair positioned on a non-carpeted surface.  Place your foot on a towel, keeping your heel on the floor.  Pull the towel toward your heel by only curling your toes. Keep your heel on the floor.  If instructed by your physician, physical therapist or athletic trainer, add ____________________ at the end of the towel. Repeat __________ times. Complete this exercise __________ times per day. STRENGTH - Ankle Eversion   Secure one end of a rubber exercise band/tubing to a fixed object (table, pole).  Loop the other end around your foot just before your toes.  Place your fists between your knees. This will focus your strengthening at your ankle.  Drawing the band/tubing across your opposite foot, slowly, pull your little toe out and up. Make sure the band/tubing is positioned to resist the entire motion.  Hold this position for __________ seconds.  Have your muscles resist the band/tubing as it slowly pulls your foot back to the starting position. Repeat __________ times. Complete this exercise __________ times per day.  STRENGTH - Ankle Inversion   Secure one end of a rubber exercise band/tubing to a fixed object (table, pole). Loop the other end around your foot just before your toes.  Place your fists between your knees. This will focus your strengthening at your ankle.  Slowly, pull your big toe up and in, making  sure the band/tubing is positioned to resist the entire motion.  Hold this position for __________ seconds.  Have your muscles resist the band/tubing as it slowly pulls your foot back to the starting position. Repeat __________ times. Complete this exercises __________ times per day.  Document Released: 01/12/2005 Document Revised: 04/06/2011 Document Reviewed: 04/26/2008 [facility] Patient Information 2014 [place], [place].

## 2013-03-22 NOTE — Progress Notes (Signed)
Thomas Caldwell D.O. [facility] [address] [place], [place] [address] Phone: [phone] Subjective:    CC: Right ankle pain and swelling  QIH:[id number]:Subjective Thomas Caldwell is a 68 y.o. male coming in with complaint of right ankle pain and swelling. Patient had this occur 10 days ago. Patient was playing golf and all of a sudden had significant pain in the medial aspect of his right ankle. Patient states he has significant swelling and bruising that occurred within hours. Over the course of time the bruising and swelling has improved but continues to have pain with certain motions such as walking a significant amount. Patient denies though any numbness, any tingling, or any loss of sensation or weakness. Patient is able to do activities of daily living but if he tries to do any more he has more pain. Patient rates the severity of 3/10. Patient is just wondering what it is and if he is safe to continue golf.     Past medical history, social, surgical and family history all reviewed in electronic medical record.   Review of Systems: No headache, visual changes, nausea, vomiting, diarrhea, constipation, dizziness, abdominal pain, skin rash, fevers, chills, night sweats, weight loss, swollen lymph nodes, body aches, joint swelling, muscle aches, chest pain, shortness of breath, mood changes.   Objective Blood pressure 122/72, pulse 52, temperature 98.2 F (36.8 C), temperature source Oral, resp. rate 16, weight 203 lb 1.3 oz (92.116 kg), SpO2 96.00%.  General: No apparent distress alert and oriented x3 mood and affect normal, dressed appropriately.  HEENT: Pupils equal, extraocular movements intact  Respiratory: Patient's speak in full sentences and does not appear short of breath  Cardiovascular: No lower extremity edema, non tender, no erythema  Skin: Warm dry intact with no signs of infection or rash on extremities or on axial skeleton.  Abdomen: Soft nontender  Neuro: Cranial  nerves II through XII are intact, neurovascularly intact in all extremities with 2+ DTRs and 2+ pulses.  Lymph: No lymphadenopathy of posterior or anterior cervical chain or axillae bilaterally.  Gait normal with good balance and coordination.  MSK:  Non tender with full range of motion and good stability and symmetric strength and tone of shoulders, elbows, wrist, hip, knee and  bilaterally.  Ankle: Right Trace swelling around the posterior tibialis near the medial malleolus. Range of motion is full in all directions. Strength is 5/5 in all directions. Stable lateral and medial ligaments; squeeze test and kleiger test unremarkable; Talar dome nontender; No pain at base of 5th MT; No tenderness over cuboid; Mild discomfort at the navicular prominence Mild discomfort at the medial malleolus just posterior to it. No sign of peroneal tendon subluxations or tenderness to palpation Negative tarsal tunnel tinel's Able to walk 4 steps. Lateral ankle unremarkable  MSK US performed of: Right ankle This study was ordered, performed, and interpreted by Terrilee FilesZach Caldwell D.O.  Foot/Ankle:   All structures visualized.   Talar dome unremarkable  Ankle mortise without effusion. Peroneus longus and brevis tendons unremarkable on long and transverse views without sheath effusions. Posterior tibialis does have significant hypoechoic area and does appear to have some mild tearing occurring intersubstance just inferior to the medial malleolus. flexor hallucis longus, and flexor digitorum longus tendons unremarkable on long and transverse views without sheath effusions. Achilles tendon visualized along length of tendon and unremarkable on long and transverse views without sheath effusion. Anterior Talofibular Ligament and Calcaneofibular Ligaments unremarkable and intact. Deltoid Ligament unremarkable and intact. Plantar fascia  intact and without effusion, normal thickness. No increased doppler signal, cap sign,  or thickening of tibial cortex. Power doppler signal normal.  IMPRESSION:  Posterior tibialis tendinitis      Impression and Recommendations:     This case required medical decision making of moderate complexity.

## 2013-03-23 ENCOUNTER — Ambulatory Visit: Payer: 59 | Admitting: Family Medicine

## 2013-03-24 ENCOUNTER — Other Ambulatory Visit: Payer: Self-pay | Admitting: Internal Medicine

## 2013-03-28 ENCOUNTER — Other Ambulatory Visit: Payer: Self-pay

## 2013-03-28 MED ORDER — TRAZODONE HCL 50 MG PO TABS: ORAL_TABLET | ORAL | Status: DC

## 2013-03-28 NOTE — Telephone Encounter (Signed)
Done erx  Due for f/u may 2015

## 2013-03-31 ENCOUNTER — Other Ambulatory Visit: Payer: Self-pay | Admitting: Internal Medicine

## 2013-04-04 ENCOUNTER — Other Ambulatory Visit: Payer: Self-pay

## 2013-04-04 MED ORDER — SIMVASTATIN 40 MG PO TABS: 40.0000 mg | ORAL_TABLET | Freq: Every day | ORAL | Status: DC

## 2013-04-06 ENCOUNTER — Ambulatory Visit (INDEPENDENT_AMBULATORY_CARE_PROVIDER_SITE_OTHER): Payer: 59 | Admitting: Family Medicine

## 2013-04-06 ENCOUNTER — Encounter: Payer: Self-pay | Admitting: Family Medicine

## 2013-04-06 VITALS — BP 132/70 | HR 51 | Temp 98.1°F | Resp 16 | Wt 205.0 lb

## 2013-04-06 DIAGNOSIS — M76829 Posterior tibial tendinitis, unspecified leg: Secondary | ICD-10-CM

## 2013-04-06 DIAGNOSIS — M76821 Posterior tibial tendinitis, right leg: Secondary | ICD-10-CM

## 2013-04-06 NOTE — Progress Notes (Signed)
Pre visit review using our clinic review tool, if applicable. No additional management support is needed unless otherwise documented below in the visit note. 

## 2013-04-06 NOTE — Patient Instructions (Addendum)
Good to see you Continue icing after golf for 20 minutes.  Exercises 2 times a week for another 6 weeks.  Keep it up. See you when I need you.  After you play 4:1 ratio of carbs to protein within the hour.  Example chocolate milk would be great Consider Ensure or another protein drink 1-2 times a day in addition to regular meals.   I would also start gatorade after first 9 holes.

## 2013-04-06 NOTE — Progress Notes (Signed)
  [name] [address] [place], [place] [address] Phone: [phone] Subjective:    CC: Right ankle pain and swelling  XOV:ANVBTYOMAY Thomas Caldwell is a 68 y.o. male coming in with complaint of right ankle pain and swelling. Patient had this occur 10 days ago. Patient was playing golf and all of a sudden had significant pain in the medial aspect of his right ankle. Patient states he has significant swelling and bruising that occurred within hours. Over the course of time the bruising and swelling has improved but continues to have pain with certain motions such as walking a significant amount. Patient denies though any numbness, any tingling, or any loss of sensation or weakness. Patient is able to do activities of daily living but if he tries to do any more he has more pain. Patient rates the severity of 3/10. Patient is just wondering what it is and if he is safe to continue golf.     Past medical history, social, surgical and family history all reviewed in electronic medical record.   Review of Systems: No headache, visual changes, nausea, vomiting, diarrhea, constipation, dizziness, abdominal pain, skin rash, fevers, chills, night sweats, weight loss, swollen lymph nodes, body aches, joint swelling, muscle aches, chest pain, shortness of breath, mood changes.   Objective Blood pressure 132/70, pulse 51, temperature 98.1 F (36.7 C), temperature source Oral, resp. rate 16, weight 205 lb (92.987 kg), SpO2 98.00%.  General: No apparent distress alert and oriented x3 mood and affect normal, dressed appropriately.  HEENT: Pupils equal, extraocular movements intact  Respiratory: Patient's speak in full sentences and does not appear short of breath  Cardiovascular: No lower extremity edema, non tender, no erythema  Skin: Warm dry intact with no signs of infection or rash on extremities or on axial skeleton.  Abdomen: Soft nontender  Neuro: Cranial nerves II  through XII are intact, neurovascularly intact in all extremities with 2+ DTRs and 2+ pulses.  Lymph: No lymphadenopathy of posterior or anterior cervical chain or axillae bilaterally.  Gait normal with good balance and coordination.  MSK:  Non tender with full range of motion and good stability and symmetric strength and tone of shoulders, elbows, wrist, hip, knee and  bilaterally.  Ankle: Right No swelling noted.  Range of motion is full in all directions. Strength is 5/5 in all directions. Stable lateral and medial ligaments; squeeze test and kleiger test unremarkable; Talar dome nontender; No pain at base of 5th MT; No tenderness over cuboid; Nontender at the navicular prominence Nontender at the medial malleolus just posterior to it. No sign of peroneal tendon subluxations or tenderness to palpation Negative tarsal tunnel tinel's Able to walk 4 steps. Lateral ankle unremarkable     Impression and Recommendations:     This case required medical decision making of moderate complexity.

## 2013-04-06 NOTE — Assessment & Plan Note (Signed)
Patient is doing remarkably well at this time. Encourage him to continue the exercises to 3 times a week. Patient will continue his regular activities and continues to walk 10,000 20,000 steps daily. Patient and will come back on an as-needed basis.

## 2013-04-18 ENCOUNTER — Encounter: Payer: Self-pay | Admitting: Family

## 2013-04-18 ENCOUNTER — Ambulatory Visit (INDEPENDENT_AMBULATORY_CARE_PROVIDER_SITE_OTHER): Payer: 59 | Admitting: Family

## 2013-04-18 VITALS — BP 122/84 | HR 68 | Temp 98.7°F | Ht 72.0 in | Wt 205.0 lb

## 2013-04-18 DIAGNOSIS — J309 Allergic rhinitis, unspecified: Secondary | ICD-10-CM

## 2013-04-18 DIAGNOSIS — J329 Chronic sinusitis, unspecified: Secondary | ICD-10-CM

## 2013-04-18 DIAGNOSIS — B9789 Other viral agents as the cause of diseases classified elsewhere: Secondary | ICD-10-CM

## 2013-04-18 MED ORDER — METHYLPREDNISOLONE 4 MG PO KIT: PACK | ORAL | Status: AC

## 2013-04-18 NOTE — Patient Instructions (Signed)

## 2013-04-18 NOTE — Progress Notes (Signed)
Subjective:    Patient ID: Thomas Caldwell, male    DOB: October 24, 1945, 68 y.o.   MRN: [id number]  HPI  68 year old caucasian, nonsmoker presenting today for cough, nasal congestion, phlegm, ear pain, and sinus pressure x 3 days and worsening. He has taken robitussin over the last two nights that has not helped.    Review of Systems  Constitutional: Negative.  Negative for fever.  HENT: Positive for congestion, postnasal drip and sinus pressure. Negative for hearing loss.   Eyes: Negative.   Respiratory: Positive for cough.   Cardiovascular: Negative.   Endocrine: Negative.   Musculoskeletal: Negative.   Skin: Negative.   Allergic/Immunologic: Negative.   Neurological: Positive for headaches. Negative for dizziness and weakness.   Past Medical History  Diagnosis Date  . ANEURYSM 02/02/2007  . BRADYCARDIA, CHRONIC 10/06/2007  . ALLERGIC RHINITIS 01/11/2008  . GERD 07/05/2007  . HYPERLIPIDEMIA 02/02/2007  . HYPERTENSION 02/02/2007  . 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 DIVERTICULOSIS, COLON 07/05/2007  . 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 Lumbago 01/11/2008    History   Social History  . Marital Status: Married    Spouse Name: N/A    Number of Children: N/A  . Years of Education: N/A   Occupational History  . Not on file.   Social History Main Topics  . Smoking status: Never Smoker   . Smokeless tobacco: Not on file  . Alcohol Use: No  . Drug Use: No  . Sexual Activity: Yes    Birth Control/ Protection: None   Other Topics Concern  . Not on file   Social History Narrative  . No narrative on file    Past Surgical History  Procedure Laterality Date  . Appendectomy  1982  . Sah left brain  1992    due to brain aneurysm s/p repair  . Lumbar disc surgery  12/2005    Dr. Venetia Maxon  . Hernia repair      right    Family History  Problem Relation Age of Onset  . Hypertension Father   . Heart disease Father   . Heart disease Sister   .  Hypertension Brother     3 bother with HTN    Allergies  Allergen Reactions  . Codeine     REACTION: confusion    Current Outpatient Prescriptions on File Prior to Visit  Medication Sig Dispense Refill  . amLODipine (NORVASC) 5 MG tablet TAKE 1 TABLET BY MOUTH EVERY DAY  90 tablet  1  . aspirin 81 MG EC tablet Take 81 mg by mouth daily.        Marland Kitchen atenolol (TENORMIN) 25 MG tablet TAKE 1 TABLET BY MOUTH EVERY DAY  90 tablet  1  . nabumetone (RELAFEN) 500 MG tablet TAKE 1 TABLET BY MOUTH TWICE DAILY  60 tablet  5  . omeprazole (PRILOSEC) 20 MG capsule TAKE ONE CAPSULE BY MOUTH DAILY  90 capsule  2  . promethazine (PHENERGAN) 25 MG tablet Take 1 tablet (25 mg total) by mouth every 6 (six) hours as needed for nausea.  25 tablet  0  . simvastatin (ZOCOR) 40 MG tablet Take 1 tablet (40 mg total) by mouth daily.  90 tablet  3  . traZODone (DESYREL) 50 MG tablet TAKE 1 TABLET BY MOUTH EVERY NIGHT AT BEDTIME  90 tablet  0   No current facility-administered medications on file prior to visit.    BP 122/84  Pulse 68  Temp(Src) 98.7 F (  37.1 C) (Oral)  Ht 6' (1.829 m)  Wt 205 lb (92.987 kg)  BMI 27.80 kg/m2  SpO2 98%chart    Objective:   Physical Exam  Constitutional: He is oriented to person, place, and time. He appears well-developed and well-nourished.  HENT:  Right Ear: External ear normal.  Left Ear: External ear normal.  Nose: Nose normal.  Mouth/Throat: Oropharynx is clear and moist.  Maxillary sinus tenderness  Neck: Normal range of motion. Neck supple.  Cardiovascular: Normal rate, regular rhythm and normal heart sounds.   Pulmonary/Chest: Effort normal and breath sounds normal. He has no wheezes.  Lymphadenopathy:    He has no cervical adenopathy.  Neurological: He is alert and oriented to person, place, and time.  Skin: Skin is warm and dry.          Assessment & Plan:  Thomas Caldwell was seen today for sinusitis, headache, cough and otalgia.  Diagnoses and associated  orders for this visit:  Viral sinusitis  Allergic rhinitis  Other Orders - methylPREDNISolone (MEDROL DOSEPAK) 4 MG tablet; follow package directions

## 2013-05-10 ENCOUNTER — Telehealth: Payer: Self-pay | Admitting: Internal Medicine

## 2013-05-10 NOTE — Telephone Encounter (Signed)
Patient Information:  Caller Name: Harvie Heck  Phone: [phone]  Patient: Thomas Caldwell, Thomas Caldwell  Gender: Male  DOB: 1945/09/27  Age: 68 Years  PCP: Oliver Barre (Adults only)  Office Follow Up:  Does the office need to follow up with this patient?: Yes  Instructions For The Office: see notes  RN Note:  States he is at [employer] for the next 10 days for work and will have to talk all day long for work and the coughing is very much interfering with that. He states he cannot make it to office for appt today or tomorrow due to the importance of this work event unless there is someone in [employer] that he can see. He would like to know if MD would be able to prescribe something for cough that help better than OTC meds but does not want anything that would affect his mental clarity as he has to be able to work. Assured him would send message for MD review and someone will f/u with him today.  Symptoms  Reason For Call & Symptoms: Pt seen in office on 3/24 for cough/congestion/sinus&ear pain. Diagnosed with viral sinusitis & allergic rhinitis and was prescribed a Medrol Dosepak at that time. States the medicine helped to resolve most of his sx but the cough has continued and has worsened. Having frequent coughing episodes that interfere with work and sleep. States cough is dry but feels like he has some chest congestion that is not coming up. Cough drops and Mucinex not helping. No fever. Denies wheezing or SOB.  Reviewed Health History In EMR: Yes  Reviewed Medications In EMR: Yes  Reviewed Allergies In EMR: Yes  Reviewed Surgeries / Procedures: Yes  Date of Onset of Symptoms: 04/04/2013  Treatments Tried: cough drops; mucinex  Treatments Tried Worked: No  Guideline(s) Used:  Cough  Disposition Per Guideline:   See Today or Tomorrow in Office  Reason For Disposition Reached:   Continuous (nonstop) coughing interferes with work or school and no improvement using cough treatment  per Care Advice  Advice Given:  N/A  Patient Will Follow Care Advice:  YES

## 2013-05-11 MED ORDER — BENZONATATE 100 MG PO CAPS: ORAL_CAPSULE | ORAL | Status: DC

## 2013-05-11 NOTE — Telephone Encounter (Signed)
Ok for [employer] perle prn -  Also can try OTC Delsym - 12 hr

## 2013-05-12 NOTE — Telephone Encounter (Signed)
Patient notified

## 2013-05-16 ENCOUNTER — Telehealth: Payer: Self-pay | Admitting: *Deleted

## 2013-05-16 MED ORDER — BENZONATATE 200 MG PO CAPS: 200.0000 mg | ORAL_CAPSULE | Freq: Three times a day (TID) | ORAL | Status: DC | PRN

## 2013-05-16 NOTE — Telephone Encounter (Signed)
Received fax stating benzonatate 100 mg capsule are on backorder. Requesting to be change to 200 mg capsule or another alternative cough suppressant...Raechel Chute

## 2013-05-16 NOTE — Telephone Encounter (Signed)
Received fax from pharmacy stating that the benzonatate 100mg  is on back order until sometime in May. Would you like to switch pt to Delsym, Hycodan, Robitussin AC, or Promethazine DM?

## 2013-05-16 NOTE — Telephone Encounter (Signed)
Ok for delsym otc prn as well

## 2013-05-16 NOTE — Telephone Encounter (Signed)
Already change to benzonatate 200 mg

## 2013-05-16 NOTE — Telephone Encounter (Signed)
Ok to change to 200 mg prn - done erx

## 2013-05-18 MED ORDER — DEXTROMETHORPHAN POLISTIREX 30 MG/5ML PO LQCR: 30.0000 mg | ORAL | Status: DC | PRN

## 2013-05-18 NOTE — Telephone Encounter (Signed)
Sent delsym to pharmacy...Raechel Chute

## 2013-06-26 ENCOUNTER — Other Ambulatory Visit (INDEPENDENT_AMBULATORY_CARE_PROVIDER_SITE_OTHER): Payer: 59

## 2013-06-26 DIAGNOSIS — Z0000 Encounter for general adult medical examination without abnormal findings: Secondary | ICD-10-CM

## 2013-06-26 LAB — TSH: TSH: 4.42 u[IU]/mL (ref 0.35–4.50)

## 2013-06-26 LAB — HEPATIC FUNCTION PANEL
ALT: 23 U/L (ref 0–53)
AST: 27 U/L (ref 0–37)
Albumin: 4 g/dL (ref 3.5–5.2)
Alkaline Phosphatase: 72 U/L (ref 39–117)
BILIRUBIN DIRECT: 0.1 mg/dL (ref 0.0–0.3)
Total Bilirubin: 0.8 mg/dL (ref 0.2–1.2)
Total Protein: 6.7 g/dL (ref 6.0–8.3)

## 2013-06-26 LAB — CBC WITH DIFFERENTIAL/PLATELET
BASOS ABS: 0.1 10*3/uL (ref 0.0–0.1)
BASOS PCT: 0.7 % (ref 0.0–3.0)
EOS ABS: 0.2 10*3/uL (ref 0.0–0.7)
Eosinophils Relative: 2.5 % (ref 0.0–5.0)
HCT: 46.9 % (ref 39.0–52.0)
Hemoglobin: 15.7 g/dL (ref 13.0–17.0)
LYMPHS PCT: 29.4 % (ref 12.0–46.0)
Lymphs Abs: 2.3 10*3/uL (ref 0.7–4.0)
MCHC: 33.4 g/dL (ref 30.0–36.0)
MCV: 88.4 fl (ref 78.0–100.0)
MONO ABS: 0.9 10*3/uL (ref 0.1–1.0)
Monocytes Relative: 11.9 % (ref 3.0–12.0)
Neutro Abs: 4.4 10*3/uL (ref 1.4–7.7)
Neutrophils Relative %: 55.5 % (ref 43.0–77.0)
Platelets: 205 10*3/uL (ref 150.0–400.0)
RBC: 5.31 Mil/uL (ref 4.22–5.81)
RDW: 13.8 % (ref 11.5–15.5)
WBC: 7.9 10*3/uL (ref 4.0–10.5)

## 2013-06-26 LAB — BASIC METABOLIC PANEL
BUN: 28 mg/dL — ABNORMAL HIGH (ref 6–23)
CHLORIDE: 108 meq/L (ref 96–112)
CO2: 26 mEq/L (ref 19–32)
Calcium: 9.5 mg/dL (ref 8.4–10.5)
Creatinine, Ser: 1.2 mg/dL (ref 0.4–1.5)
GFR: 64.13 mL/min (ref >60.00)
Glucose, Bld: 102 mg/dL — ABNORMAL HIGH (ref 70–99)
POTASSIUM: 5.5 meq/L — AB (ref 3.5–5.1)
Sodium: 141 mEq/L (ref 135–145)

## 2013-06-26 LAB — URINALYSIS, ROUTINE W REFLEX MICROSCOPIC
Bilirubin Urine: NEGATIVE
Hgb urine dipstick: NEGATIVE
KETONES UR: NEGATIVE
Leukocytes, UA: NEGATIVE
Nitrite: NEGATIVE
PH: 6 (ref 5.0–8.0)
RBC / HPF: NONE SEEN (ref 0–?)
SPECIFIC GRAVITY, URINE: 1.025 (ref 1.000–1.030)
Total Protein, Urine: NEGATIVE
Urine Glucose: NEGATIVE
Urobilinogen, UA: 0.2 (ref 0.0–1.0)
WBC, UA: NONE SEEN (ref 0–?)

## 2013-06-26 LAB — PSA: PSA: 1.26 ng/mL (ref 0.10–4.00)

## 2013-06-26 LAB — LIPID PANEL
Cholesterol: 203 mg/dL — ABNORMAL HIGH (ref 0–200)
HDL: 47.1 mg/dL (ref >39.00)
LDL CALC: 133 mg/dL — AB (ref 0–99)
Total CHOL/HDL Ratio: 4
Triglycerides: 113 mg/dL (ref 0.0–149.0)
VLDL: 22.6 mg/dL (ref 0.0–40.0)

## 2013-06-27 ENCOUNTER — Other Ambulatory Visit: Payer: Self-pay | Admitting: Internal Medicine

## 2013-06-29 ENCOUNTER — Encounter: Payer: Self-pay | Admitting: Gastroenterology

## 2013-06-29 ENCOUNTER — Encounter: Payer: Self-pay | Admitting: Internal Medicine

## 2013-06-29 ENCOUNTER — Ambulatory Visit (INDEPENDENT_AMBULATORY_CARE_PROVIDER_SITE_OTHER): Payer: 59 | Admitting: Internal Medicine

## 2013-06-29 VITALS — BP 118/87 | HR 64 | Temp 97.9°F | Ht 72.0 in | Wt 203.0 lb

## 2013-06-29 DIAGNOSIS — M25529 Pain in unspecified elbow: Secondary | ICD-10-CM

## 2013-06-29 DIAGNOSIS — R1319 Other dysphagia: Secondary | ICD-10-CM

## 2013-06-29 DIAGNOSIS — Z23 Encounter for immunization: Secondary | ICD-10-CM

## 2013-06-29 DIAGNOSIS — Z136 Encounter for screening for cardiovascular disorders: Secondary | ICD-10-CM

## 2013-06-29 DIAGNOSIS — Z0000 Encounter for general adult medical examination without abnormal findings: Secondary | ICD-10-CM

## 2013-06-29 DIAGNOSIS — M25521 Pain in right elbow: Secondary | ICD-10-CM | POA: Insufficient documentation

## 2013-06-29 MED ORDER — OMEPRAZOLE 20 MG PO CPDR: DELAYED_RELEASE_CAPSULE | ORAL | Status: DC

## 2013-06-29 MED ORDER — NAPROXEN 500 MG PO TABS: 500.0000 mg | ORAL_TABLET | Freq: Two times a day (BID) | ORAL | Status: DC

## 2013-06-29 MED ORDER — TRAZODONE HCL 50 MG PO TABS: ORAL_TABLET | ORAL | Status: DC

## 2013-06-29 NOTE — Assessment & Plan Note (Addendum)

## 2013-06-29 NOTE — Patient Instructions (Addendum)
You had the new Prevnar pneumonia shot today  Please take all new medication as prescribed- the anti-inflammatory for the elbow  Please consider seeing Dr Katrinka Blazing for cortisone shot if not improved in 1-2 wks  OK to increase the prilosec to 2 of the 20 mg per day  Please continue all other medications as before, and refills have been done if requested. Please have the pharmacy call with any other refills you may need.  Please continue your efforts at being more active, low cholesterol diet, and weight control.  You are otherwise up to date with prevention measures today.  You will be contacted regarding the referral for: GI  Please keep your appointments with your specialists as you may have planned  Your EKG was OK today  Your blood work was OK today  Please return in 1 year for your yearly visit, or sooner if needed, with Lab testing done 3-5 days before

## 2013-06-29 NOTE — Assessment & Plan Note (Signed)
C/w lateral epicondylitis - for naproxen prn, consider sport med referral

## 2013-06-29 NOTE — Assessment & Plan Note (Signed)
For GI referral, incresae PPi to 2 per day

## 2013-06-29 NOTE — Addendum Note (Signed)
Addended by: Scharlene Gloss B on: 06/29/2013 09:26 AM   Modules accepted: Orders

## 2013-06-29 NOTE — Progress Notes (Signed)
Subjective:    Patient ID: Angelena SoleRandolph Knudtson, male    DOB: Jan 08, 1946, 68 y.o.   MRN: [id number]  HPI  Here for wellness and f/u;  Overall doing ok;  Pt denies CP, worsening SOB, DOE, wheezing, orthopnea, PND, worsening LE edema, palpitations, dizziness or syncope.  Pt denies neurological change such as new headache, facial or extremity weakness.  Pt denies polydipsia, polyuria, or low sugar symptoms. Pt states overall good compliance with treatment and medications, good tolerability, and has been trying to follow lower cholesterol diet.  Pt denies worsening depressive symptoms, suicidal ideation or panic. No fever, night sweats, wt loss, loss of appetite, or other constitutional symptoms.  Pt states good ability with ADL's, has low fall risk, home safety reviewed and adequate, no other significant changes in hearing or vision, and only occasionally active with exercise.  Has some right tennis elbow pain, wearing elbow brace with playing golf.  Does also c/o dyphagia to solids when not chewed well, such as having dinner with a client and not chewing completely with talking during dinner Past Medical History  Diagnosis Date  . ANEURYSM 02/02/2007  . BRADYCARDIA, CHRONIC 10/06/2007  . ALLERGIC RHINITIS 01/11/2008  . GERD 07/05/2007  . HYPERLIPIDEMIA 02/02/2007  . HYPERTENSION 02/02/2007  . 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 DIVERTICULOSIS, COLON 07/05/2007  . 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 Lumbago 01/11/2008   Past Surgical History  Procedure Laterality Date  . Appendectomy  1982  . Sah left brain  1992    due to brain aneurysm s/p repair  . Lumbar disc surgery  12/2005    Dr. Venetia MaxonStern  . Hernia repair      right    reports that he has never smoked. He does not have any smokeless tobacco history on file. He reports that he does not drink alcohol or use illicit drugs. family history includes Heart disease in his father and sister; Hypertension in his brother and  father. Allergies  Allergen Reactions  . Codeine     REACTION: confusion   Current Outpatient Prescriptions on File Prior to Visit  Medication Sig Dispense Refill  . amLODipine (NORVASC) 5 MG tablet TAKE 1 TABLET BY MOUTH EVERY DAY  90 tablet  1  . aspirin 81 MG EC tablet Take 81 mg by mouth daily.        Marland Kitchen. atenolol (TENORMIN) 25 MG tablet TAKE 1 TABLET BY MOUTH EVERY DAY  90 tablet  1  . omeprazole (PRILOSEC) 20 MG capsule TAKE 1 CAPSULE BY MOUTH DAILY  90 capsule  0  . simvastatin (ZOCOR) 40 MG tablet Take 1 tablet (40 mg total) by mouth daily.  90 tablet  3  . promethazine (PHENERGAN) 25 MG tablet Take 1 tablet (25 mg total) by mouth every 6 (six) hours as needed for nausea.  25 tablet  0   No current facility-administered medications on file prior to visit.   Review of Systems Constitutional: Negative for increased diaphoresis, other activity, appetite or other siginficant weight change  HENT: Negative for worsening hearing loss, ear pain, facial swelling, mouth sores and neck stiffness.   Eyes: Negative for other worsening pain, redness or visual disturbance.  Respiratory: Negative for shortness of breath and wheezing.   Cardiovascular: Negative for chest pain and palpitations.  Gastrointestinal: Negative for diarrhea, blood in stool, abdominal distention or other pain Genitourinary: Negative for hematuria, flank pain or change in urine volume.  Musculoskeletal: Negative for myalgias or other joint complaints.  Skin:  Negative for color change and wound.  Neurological: Negative for syncope and numbness. other than noted Hematological: Negative for adenopathy. or other swelling Psychiatric/Behavioral: Negative for hallucinations, self-injury, decreased concentration or other worsening agitation.      Objective:   Physical Exam BP 118/87  Pulse 64  Temp(Src) 97.9 F (36.6 C) (Oral)  Ht 6' (1.829 m)  Wt 203 lb (92.08 kg)  BMI 27.53 kg/m2  SpO2 98% VS noted,    Constitutional: Pt is oriented to person, place, and time. Appears well-developed and well-nourished.  Head: Normocephalic and atraumatic.  Right Ear: External ear normal.  Left Ear: External ear normal.  Nose: Nose normal.  Mouth/Throat: Oropharynx is clear and moist.  Eyes: Conjunctivae and EOM are normal. Pupils are equal, round, and reactive to light.  Neck: Normal range of motion. Neck supple. No JVD present. No tracheal deviation present.  Cardiovascular: Normal rate, regular rhythm, normal heart sounds and intact distal pulses.   Pulmonary/Chest: Effort normal and breath sounds without rales or wheezing  Abdominal: Soft. Bowel sounds are normal. NT. No HSM  Musculoskeletal: Normal range of motion. Exhibits no edema.  Lymphadenopathy:  Has no cervical adenopathy.  Neurological: Pt is alert and oriented to person, place, and time. Pt has normal reflexes. No cranial nerve deficit. Motor grossly intact Skin: Skin is warm and dry. No rash noted.  Psychiatric:  Has normal mood and affect. Behavior is normal.     Assessment & Plan:

## 2013-06-29 NOTE — Progress Notes (Signed)
Pre visit review using our clinic review tool, if applicable. No additional management support is needed unless otherwise documented below in the visit note. 

## 2013-06-30 ENCOUNTER — Other Ambulatory Visit: Payer: Self-pay | Admitting: Internal Medicine

## 2013-07-07 ENCOUNTER — Encounter: Payer: Self-pay | Admitting: Family Medicine

## 2013-07-07 ENCOUNTER — Other Ambulatory Visit (INDEPENDENT_AMBULATORY_CARE_PROVIDER_SITE_OTHER): Payer: 59

## 2013-07-07 ENCOUNTER — Ambulatory Visit (INDEPENDENT_AMBULATORY_CARE_PROVIDER_SITE_OTHER): Payer: 59 | Admitting: Family Medicine

## 2013-07-07 VITALS — BP 128/82 | HR 53 | Ht 72.0 in | Wt 204.0 lb

## 2013-07-07 DIAGNOSIS — M25529 Pain in unspecified elbow: Secondary | ICD-10-CM

## 2013-07-07 DIAGNOSIS — M25521 Pain in right elbow: Secondary | ICD-10-CM

## 2013-07-07 DIAGNOSIS — M7710 Lateral epicondylitis, unspecified elbow: Secondary | ICD-10-CM

## 2013-07-07 MED ORDER — DICLOFENAC SODIUM 2 % TD SOLN: 2.0000 "application " | Freq: Two times a day (BID) | TRANSDERMAL | Status: DC

## 2013-07-07 NOTE — Assessment & Plan Note (Addendum)
Patient does have lateral epicondylitis as well as a tear of the common extensor tendon. Patient was given a wrist brace today to avoid any wrist extension. We discussed how since we can work positioning as well as lifting positioning can be helpful. Patient did topical anti-inflammatories. We discussed icing protocol will be help was well. Patient will try this for 2-3 weeks. If he comes back we'll do an ultrasound-guided injection if he continues to have pain likely.  Spent greater than 25 minutes with patient face-to-face and had greater than 50% of counseling including as described above in assessment and plan.

## 2013-07-07 NOTE — Patient Instructions (Addendum)
Great to see you.  Ice 20 minutes 2 times a day Pennsaid 2 times daily.  Wear brace day and night for next 2 weeks. Then nightly for another 2 weeks.  Exercises 3 times a week.  Come back in 2-3 weeks.

## 2013-07-07 NOTE — Progress Notes (Signed)
  [name] [address] [place], [place] [address] Phone: [phone] Subjective:    CC: Elbow pain, right  DJS:[id number] Thomas Caldwell is a 68 y.o. male coming in with complaint of  Right elbow pain. Patient states that this started about 2 weeks ago. Patient states that he has been playing more golf recently and stated that it started giving her more discomfort. Patient states that he tries to lift anything heavy he has severe pain. Patient states over the course of day when he sitting at a computer he can be very uncomfortable. Denies any radiation to the arms or any numbness but intermedullary contained a keeps him up at night. Patient was put on naproxen by primary care provider without any significant improvement. Patient is a severity of 7/10 and seems to be worsening.     Past medical history, social, surgical and family history all reviewed in electronic medical record.   Review of Systems: No headache, visual changes, nausea, vomiting, diarrhea, constipation, dizziness, abdominal pain, skin rash, fevers, chills, night sweats, weight loss, swollen lymph nodes, body aches, joint swelling, muscle aches, chest pain, shortness of breath, mood changes.   Objective Blood pressure 128/82, pulse 53, height 6' (1.829 m), weight 204 lb (92.534 kg), SpO2 96.00%.  General: No apparent distress alert and oriented x3 mood and affect normal, dressed appropriately.  HEENT: Pupils equal, extraocular movements intact  Respiratory: Patient's speak in full sentences and does not appear short of breath  Cardiovascular: No lower extremity edema, non tender, no erythema  Skin: Warm dry intact with no signs of infection or rash on extremities or on axial skeleton.  Abdomen: Soft nontender  Neuro: Cranial nerves II through XII are intact, neurovascularly intact in all extremities with 2+ DTRs and 2+ pulses.  Lymph: No lymphadenopathy of posterior or anterior cervical  chain or axillae bilaterally.  Gait normal with good balance and coordination.  MSK:  Non tender with full range of motion and good stability and symmetric strength and tone of shoulders, wrist, hip, knee and ankles bilaterally.  Elbow: Right Unremarkable to inspection. Range of motion full pronation, supination, flexion, extension. Strength is full to all of the above directions the mild weakness with wrist extension. Pain with resisted extension of the middle finger at the lateral epicondylar region. Stable to varus, valgus stress. Negative moving valgus stress test. Tender to palpation over the lateral epicondylar region Ulnar nerve does not sublux. Negative cubital tunnel Tinel's.  Musculoskeletal ultrasound was performed and interpreted by Terrilee Files D.O.   Elbow: Right Lateral epicondyle and common extensor tendon origin visualized.  Patient does have significant amount of edema and does have partial tearing noted of the extensor tendon. No true avulsion noted. Mild retraction of 0.2 cm noted. Radial head unremarkable and located in annular ligament Medial epicondyle and common flexor tendon origin visualized.  No edema, effusions, or avulsions seen. Ulnar nerve in cubital tunnel unremarkable. Olecranon and triceps insertion visualized and unremarkable without edema, effusion, or avulsion.  No signs olecranon bursitis. Power doppler signal normal.  IMPRESSION:  Common extensor tendon tear and lateral epicondylitis.     Impression and Recommendations:     This case required medical decision making of moderate complexity.

## 2013-07-31 ENCOUNTER — Encounter: Payer: Self-pay | Admitting: Family Medicine

## 2013-07-31 ENCOUNTER — Ambulatory Visit (INDEPENDENT_AMBULATORY_CARE_PROVIDER_SITE_OTHER): Payer: 59 | Admitting: Family Medicine

## 2013-07-31 VITALS — BP 132/84 | HR 53 | Ht 72.0 in | Wt 202.0 lb

## 2013-07-31 DIAGNOSIS — M7710 Lateral epicondylitis, unspecified elbow: Secondary | ICD-10-CM

## 2013-07-31 NOTE — Assessment & Plan Note (Addendum)
Discussed with patient at great length. Patient will continue the anti-inflammatories as well as icing protocol. Patient will continue to wear the wrist brace at night. There is an avulsion fracture noted but it seems to be healing slowly. We did discuss increasing his calcium for short amount of time as well as vitamin D. Patient will wear a counterforce brace with activity. Patient will come back and see me again in 3-4 weeks for further evaluation and treatment. We discussed formal physical therapy the patient travels too much. Spent greater than 25 minutes with patient face-to-face and had greater than 50% of counseling including as described above in assessment and plan.

## 2013-07-31 NOTE — Patient Instructions (Signed)
Very nice to see you Continue the exercises 3 times a week.  Ice 20 minutes after exercises and golf and maybe before bed.  Wear the wrist brace at night still.  Wear the other strap when playing golf.  Come back in 3 weeks .  If not better we will do injections.

## 2013-07-31 NOTE — Progress Notes (Signed)
  Thomas Caldwell [address] [place], [place] [address] Phone: [phone] Subjective:    CC: Elbow pain, right follow up.   [id number] Thomas Caldwell is a 68 y.o. male coming in with complaint of  Right elbow pain. Patient did have a tear in the common extensor tendon previously. Patient was treated for lateral epicondylitis with bracing, icing, as well as home exercises. Patient states he is doing significantly better. Patient has not been playing any golf but states that his regular daily activities seem to be doing better. Patient does wear the wrist brace at night. Patient is not wearing a counterforce brace. Patient has tried a topical Caldwell with minimal benefit. Patient states though about 75-85% better.  Patient is also having some mild wrist pain. Patient was diagnosed with more of a de Quervain's tenosynovitis. Patient states that it seemed to completely resolved at this time.     Past medical history, social, surgical and family history all reviewed in electronic medical record.   Review of Systems: No headache, visual changes, nausea, vomiting, diarrhea, constipation, dizziness, abdominal pain, skin rash, fevers, chills, night sweats, weight loss, swollen lymph nodes, body aches, joint swelling, muscle aches, chest pain, shortness of breath, mood changes.   Objective There were no vitals taken for this visit.  General: No apparent distress alert and oriented x3 mood and affect normal, dressed appropriately.  HEENT: Pupils equal, extraocular movements intact  Respiratory: Patient's speak in full sentences and does not appear short of breath  Cardiovascular: No lower extremity edema, non tender, no erythema  Skin: Warm dry intact with no signs of infection or rash on extremities or on axial skeleton.  Abdomen: Soft nontender  Neuro: Cranial nerves II through XII are intact, neurovascularly intact in all extremities with 2+ DTRs and 2+ pulses.    Lymph: No lymphadenopathy of posterior or anterior cervical chain or axillae bilaterally.  Gait normal with good balance and coordination.  MSK:  Non tender with full range of motion and good stability and symmetric strength and tone of shoulders, wrist, hip, knee and ankles bilaterally.  Elbow: Right Unremarkable to inspection. Range of motion full pronation, supination, flexion, extension. Strength is full to all of the above directions the mild weakness with wrist extension. Pain with resisted extension of the middle finger at the lateral epicondylar region but less with this exam than previous exam. Stable to varus, valgus stress. Negative moving valgus stress test. Tender to palpation over the lateral epicondylar region but mildly improved. Ulnar nerve does not sublux. Negative cubital tunnel Tinel's.  Musculoskeletal ultrasound was performed and interpreted by Terrilee Files D.O.   Elbow: Right Lateral epicondyle and common extensor tendon origin visualized.  Patient does have hypoechoic changes still present and have partial tearing noted of the extensor tendon. Patient no does have an avulsion and does appear to be mildly retracted at 0.1 cm of displacement Radial head unremarkable and located in annular ligament Medial epicondyle and common flexor tendon origin visualized.  No edema, effusions, or avulsions seen. Ulnar nerve in cubital tunnel unremarkable. Olecranon and triceps insertion visualized and unremarkable without edema, effusion, or avulsion.  No signs olecranon bursitis. Power doppler signal normal.  IMPRESSION:  Common extensor tendon tear healing slowly with small avulsion fracture noted.    Impression and Recommendations:     This case required medical decision making of moderate complexity.

## 2013-08-01 ENCOUNTER — Encounter: Payer: Self-pay | Admitting: Internal Medicine

## 2013-08-01 ENCOUNTER — Ambulatory Visit (INDEPENDENT_AMBULATORY_CARE_PROVIDER_SITE_OTHER): Payer: 59 | Admitting: Internal Medicine

## 2013-08-01 VITALS — BP 122/80 | HR 58 | Temp 97.6°F | Wt 203.8 lb

## 2013-08-01 DIAGNOSIS — H9209 Otalgia, unspecified ear: Secondary | ICD-10-CM

## 2013-08-01 DIAGNOSIS — R42 Dizziness and giddiness: Secondary | ICD-10-CM

## 2013-08-01 DIAGNOSIS — H9202 Otalgia, left ear: Secondary | ICD-10-CM

## 2013-08-01 DIAGNOSIS — I10 Essential (primary) hypertension: Secondary | ICD-10-CM

## 2013-08-01 MED ORDER — FLUTICASONE PROPIONATE 50 MCG/ACT NA SUSP: 1.0000 | Freq: Every day | NASAL | Status: DC

## 2013-08-01 MED ORDER — MECLIZINE HCL 12.5 MG PO TABS: 12.5000 mg | ORAL_TABLET | Freq: Three times a day (TID) | ORAL | Status: DC | PRN

## 2013-08-01 MED ORDER — LEVOFLOXACIN 250 MG PO TABS: 250.0000 mg | ORAL_TABLET | Freq: Every day | ORAL | Status: DC

## 2013-08-01 NOTE — Progress Notes (Signed)
Subjective:    Patient ID: Thomas Caldwell, male    DOB: [birth year], 68 y.o.   MRN: [id number]  HPI   Here with 2-3 days acute onset fever, left ear pain, pressure, headache, general weakness and malaise, and greenish d/c, with mild ST and cough, but pt denies chest pain, wheezing, increased sob or doe, orthopnea, PND, increased LE swelling, palpitations,  or syncope but has had several episodes of vertigo.  Does have several wks ongoing nasal allergy symptoms with clearish congestion, itch and sneezing, without fever, pain, ST, cough, swelling or wheezing.  Pt denies new neurological symptoms such as new headache, or facial or extremity weakness or numbness  Past Medical History  Diagnosis Date  . ANEURYSM 02/02/2007  . BRADYCARDIA, CHRONIC 10/06/2007  . ALLERGIC RHINITIS 01/11/2008  . GERD 07/05/2007  . HYPERLIPIDEMIA 02/02/2007  . HYPERTENSION 02/02/2007  . 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 DIVERTICULOSIS, COLON 07/05/2007  . 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 Lumbago 01/11/2008   Past Surgical History  Procedure Laterality Date  . Appendectomy  1982  . Sah left brain  1992    due to brain aneurysm s/p repair  . Lumbar disc surgery  12/2005    Dr. Venetia Maxon  . Hernia repair      right    reports that he has never smoked. He does not have any smokeless tobacco history on file. He reports that he does not drink alcohol or use illicit drugs. family history includes Heart disease in his father and sister; Hypertension in his brother and father. Allergies  Allergen Reactions  . Codeine     REACTION: confusion   Current Outpatient Prescriptions on File Prior to Visit  Medication Sig Dispense Refill  . amLODipine (NORVASC) 5 MG tablet TAKE 1 TABLET BY MOUTH EVERY DAY  90 tablet  1  . aspirin 81 MG EC tablet Take 81 mg by mouth daily.        Marland Kitchen atenolol (TENORMIN) 25 MG tablet TAKE 1 TABLET BY MOUTH EVERY DAY  90 tablet  1  . Diclofenac Sodium (PENNSAID) 2 %  SOLN Place 2 application onto the skin 2 (two) times daily.  112 g  3  . nabumetone (RELAFEN) 500 MG tablet TAKE 1 TABLET BY MOUTH TWICE DAILY  60 tablet  0  . naproxen (NAPROSYN) 500 MG tablet Take 1 tablet (500 mg total) by mouth 2 (two) times daily with a meal.  60 tablet  1  . omeprazole (PRILOSEC) 20 MG capsule TAKE 2 CAPSULE BY MOUTH DAILY  180 capsule  3  . simvastatin (ZOCOR) 40 MG tablet Take 1 tablet (40 mg total) by mouth daily.  90 tablet  3  . traZODone (DESYREL) 50 MG tablet TAKE 1 TABLET BY MOUTH EVERY NIGHT AT BEDTIME  90 tablet  0   No current facility-administered medications on file prior to visit.   Review of Systems  Constitutional: Negative for unusual diaphoresis or other sweats  HENT: Negative for ringing in ear Eyes: Negative for double vision or worsening visual disturbance.  Respiratory: Negative for choking and stridor.   Gastrointestinal: Negative for vomiting or other signifcant bowel change Genitourinary: Negative for hematuria or decreased urine volume.  Musculoskeletal: Negative for other MSK pain or swelling Skin: Negative for color change and worsening wound.  Neurological: Negative for tremors and numbness other than noted  Psychiatric/Behavioral: Negative for decreased concentration or agitation other than above       Objective:   Physical Exam  BP 122/80  Pulse 58  Temp(Src) 97.6 F (36.4 C) (Oral)  Wt 203 lb 12 oz (92.42 kg)  SpO2 95% VS noted,  Constitutional: Pt appears well-developed, well-nourished.  HENT: Head: NCAT.  Right Ear: External ear normal.  Left Ear: External ear normal.  Eyes: . Pupils are equal, round, and reactive to light. Conjunctivae and EOM are normal Neck: Normal range of motion. Neck supple.  Cardiovascular: Normal rate and regular rhythm.   Bilat tm's with erythema - left >>> right,   Max sinus areas non tender.  Pharynx with mild erythema, no exudate Pulmonary/Chest: Effort normal and breath sounds normal.  - no  rales or wheezing Neurological: Pt is alert. Not confused , motor grossly intact Skin: Skin is warm. No rash Psychiatric: Pt behavior is normal. No agitation.     Assessment & Plan:

## 2013-08-01 NOTE — Progress Notes (Signed)
Pre visit review using our clinic review tool, if applicable. No additional management support is needed unless otherwise documented below in the visit note. 

## 2013-08-01 NOTE — Patient Instructions (Signed)
Please take all new medication as prescribed  Please continue all other medications as before, and refills have been done if requested.  Please have the pharmacy call with any other refills you may need.  Please keep your appointments with your specialists as you may have planned     

## 2013-08-06 NOTE — Assessment & Plan Note (Signed)
Mild to mod, for antibx course for otitis,  to f/u any worsening symptoms or concerns

## 2013-08-06 NOTE — Assessment & Plan Note (Signed)
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

## 2013-08-06 NOTE — Assessment & Plan Note (Signed)
stable overall by history and exam, recent data reviewed with pt, and pt to continue medical treatment as before,  to f/u any worsening symptoms or concerns BP Readings from Last 3 Encounters:  08/01/13 122/80  07/31/13 132/84  07/07/13 128/82

## 2013-08-12 ENCOUNTER — Other Ambulatory Visit: Payer: Self-pay | Admitting: Internal Medicine

## 2013-08-21 ENCOUNTER — Other Ambulatory Visit (INDEPENDENT_AMBULATORY_CARE_PROVIDER_SITE_OTHER): Payer: 59

## 2013-08-21 ENCOUNTER — Ambulatory Visit (INDEPENDENT_AMBULATORY_CARE_PROVIDER_SITE_OTHER): Payer: 59 | Admitting: Family Medicine

## 2013-08-21 ENCOUNTER — Encounter: Payer: Self-pay | Admitting: Family Medicine

## 2013-08-21 VITALS — BP 124/82 | HR 52 | Ht 72.0 in | Wt 206.0 lb

## 2013-08-21 DIAGNOSIS — M7710 Lateral epicondylitis, unspecified elbow: Secondary | ICD-10-CM

## 2013-08-21 MED ORDER — TRIAMCINOLONE ACETONIDE 0.5 % EX CREA: 1.0000 "application " | TOPICAL_CREAM | Freq: Two times a day (BID) | CUTANEOUS | Status: DC

## 2013-08-21 NOTE — Assessment & Plan Note (Signed)
Continues to improve overall. Discuss continuing icing and exercises, and bracing.  Will come back in 3 weeks.  If continuing to have trouble will consider injection.

## 2013-08-21 NOTE — Patient Instructions (Signed)
Good to see you Exercises 3 times a week.  Ice is still your friend.  Continue wrist brace at night You are getting there.  If you want make an appointment before your trip and we will consider an injection.

## 2013-08-21 NOTE — Progress Notes (Signed)
  [name] [address] [place], [place] [address] Phone: [phone] Subjective:    CC: Elbow pain, right follow up.   [id number] Thomas Caldwell is a 68 y.o. male coming in with complaint of  Right elbow pain. Patient did have a tear in the common extensor tendon previously. Patient was treated for lateral epicondylitis with bracing, icing, as well as home exercises. Patient is unable to do the topical secondary to an allergic reaction of the skin he states. Patient is still states that he is about 85-90% better. Continues to play golf and do all activities of daily living. Some mild discomfort with extension of his middle finger against resistance he states.      Past medical history, social, surgical and family history all reviewed in electronic medical record.   Review of Systems: No headache, visual changes, nausea, vomiting, diarrhea, constipation, dizziness, abdominal pain, skin rash, fevers, chills, night sweats, weight loss, swollen lymph nodes, body aches, joint swelling, muscle aches, chest pain, shortness of breath, mood changes.   Objective Blood pressure 124/82, pulse 52, height 6' (1.829 m), weight 206 lb (93.441 kg), SpO2 96.00%.  General: No apparent distress alert and oriented x3 mood and affect normal, dressed appropriately.  HEENT: Pupils equal, extraocular movements intact  Respiratory: Patient's speak in full sentences and does not appear short of breath  Cardiovascular: No lower extremity edema, non tender, no erythema  Skin: Warm dry intact with no signs of infection or rash on extremities or on axial skeleton.  Abdomen: Soft nontender  Neuro: Cranial nerves II through XII are intact, neurovascularly intact in all extremities with 2+ DTRs and 2+ pulses.  Lymph: No lymphadenopathy of posterior or anterior cervical chain or axillae bilaterally.  Gait normal with good balance and coordination.  MSK:  Non tender with full range  of motion and good stability and symmetric strength and tone of shoulders, wrist, hip, knee and ankles bilaterally.  Elbow: Right Unremarkable to inspection. Range of motion full pronation, supination, flexion, extension. Strength is full to all of the above directions the mild weakness with wrist extension. Pain with resisted extension of the middle finger at the lateral epicondylar region but continues to improve. Stable to varus, valgus stress. Negative moving valgus stress test. Tender to palpation over the lateral epicondylar region but mildly improved. Ulnar nerve does not sublux. Negative cubital tunnel Tinel's.  Musculoskeletal ultrasound was performed and interpreted by Terrilee Files D.O.   Elbow: Right Lateral epicondyle and common extensor tendon origin visualized.  Patient does have hypoechoic changes still present and have partial tearing noted of the extensor tendon.  Radial head unremarkable and located in annular ligament Medial epicondyle and common flexor tendon origin visualized.  No edema, effusions, or avulsions seen. Ulnar nerve in cubital tunnel unremarkable. Olecranon and triceps insertion visualized and unremarkable without edema, effusion, or avulsion.  No signs olecranon bursitis. Power doppler signal normal.  IMPRESSION:  Common extensor tendon tear improving.     Impression and Recommendations:     This case required medical decision making of moderate complexity.

## 2013-08-23 ENCOUNTER — Other Ambulatory Visit: Payer: Self-pay | Admitting: Internal Medicine

## 2013-08-29 ENCOUNTER — Ambulatory Visit (INDEPENDENT_AMBULATORY_CARE_PROVIDER_SITE_OTHER): Payer: 59 | Admitting: Gastroenterology

## 2013-08-29 ENCOUNTER — Encounter: Payer: Self-pay | Admitting: Gastroenterology

## 2013-08-29 VITALS — BP 132/72 | HR 56 | Ht 70.0 in | Wt 203.5 lb

## 2013-08-29 DIAGNOSIS — R1314 Dysphagia, pharyngoesophageal phase: Secondary | ICD-10-CM

## 2013-08-29 NOTE — Progress Notes (Signed)
HPI: This is a  very pleasant 68 year old man whom I am meeting for the first time today.     25-30 years of dysphagia.  Apples, rice, chicken.  Watches his diet.    Maybe once per month, not progressive.  Has pyrosis, takes ompeprazole 1-2 times per day.  Overall stable weight.  No overt GI bleeding.  Had EGD many years ago, found "ulcers" per patient.  No stretching ever.   Review of systems: Pertinent positive and negative review of systems were noted in the above HPI section. Complete review of systems was performed and was otherwise normal.    Past Medical History  Diagnosis Date  . ANEURYSM 02/02/2007    brain  . BRADYCARDIA, CHRONIC 10/06/2007  . ALLERGIC RHINITIS 01/11/2008  . GERD 07/05/2007  . HYPERLIPIDEMIA 02/02/2007  . HYPERTENSION 02/02/2007  . 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 DIVERTICULOSIS, COLON 07/05/2007  . 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 Lumbago 01/11/2008  . Peptic ulcer     Past Surgical History  Procedure Laterality Date  . Appendectomy  1982  . Sah left brain  1992    due to brain aneurysm s/p repair  . Lumbar disc surgery  12/2005    Dr. Venetia Maxon    Current Outpatient Prescriptions  Medication Sig Dispense Refill  . amLODipine (NORVASC) 5 MG tablet TAKE 1 TABLET BY MOUTH EVERY DAY  90 tablet  3  . aspirin 81 MG EC tablet Take 81 mg by mouth daily.        Marland Kitchen atenolol (TENORMIN) 25 MG tablet TAKE 1 TABLET BY MOUTH EVERY DAY  90 tablet  3  . Diclofenac Sodium (PENNSAID) 2 % SOLN Place 2 application onto the skin 2 (two) times daily.  112 g  3  . fluticasone (FLONASE) 50 MCG/ACT nasal spray Place 1 spray into both nostrils daily.  16 g  2  . nabumetone (RELAFEN) 500 MG tablet TAKE 1 TABLET BY MOUTH TWICE DAILY  60 tablet  0  . omeprazole (PRILOSEC) 20 MG capsule TAKE 2 CAPSULE BY MOUTH DAILY  180 capsule  3  . simvastatin (ZOCOR) 40 MG tablet Take 1 tablet (40 mg total) by mouth daily.  90 tablet  3  . triamcinolone cream  (KENALOG) 0.5 % Apply 1 application topically 2 (two) times daily. To affected areas.  30 g  3  . meclizine (ANTIVERT) 12.5 MG tablet Take 1 tablet (12.5 mg total) by mouth 3 (three) times daily as needed for dizziness.  30 tablet  1  . traZODone (DESYREL) 50 MG tablet TAKE 1 TABLET BY MOUTH AS NEEDED       No current facility-administered medications for this visit.    Allergies as of 08/29/2013 - Review Complete 08/29/2013  Allergen Reaction Noted  . Codeine  07/05/2007    Family History  Problem Relation Age of Onset  . Hypertension Father   . Heart disease Father   . Heart disease Sister   . Hypertension Brother     3 bother with HTN  . Esophageal cancer Brother     History   Social History  . Marital Status: Married    Spouse Name: N/A    Number of Children: 2  . Years of Education: N/A   Occupational History  . Patent examiner   Social History Main Topics  . Smoking status: Never Smoker   . Smokeless tobacco: Never Used  . Alcohol Use: Yes     Comment: 0-1 per day  .  Drug Use: No  . Sexual Activity: Yes    Birth Control/ Protection: None   Other Topics Concern  . Not on file   Social History Narrative  . No narrative on file       Physical Exam: Ht 5\' 10"  (1.778 m)  Wt 203 lb 8 oz (92.307 kg)  BMI 29.20 kg/m2 Constitutional: generally well-appearing Psychiatric: alert and oriented x3 Eyes: extraocular movements intact Mouth: oral pharynx moist, no lesions Neck: supple no lymphadenopathy Cardiovascular: heart regular rate and rhythm Lungs: clear to auscultation bilaterally Abdomen: soft, nontender, nondistended, no obvious ascites, no peritoneal signs, normal bowel sounds Extremities: no lower extremity edema bilaterally Skin: no lesions on visible extremities    Assessment and plan: 68 y.o. male with  chronic intermittent solid food dysphagia  He has no weight loss, overt bleeding. I explained to him that given his symptoms  have been going on for 20-25 years is unlikely that he has underlying neoplasm such as esophageal cancer. I did recommend we proceed with EGD to exclude that however and also to treat what is likely a peptic stricture or Schatzki's ring. He wanted to discuss this recommendation with his wife and will get back to me. In the meantime he knows to chew his food very well, eat slowly and take small bites. He will stay on proton pump inhibitor 1-2 times daily for his GERD symptoms.

## 2013-08-29 NOTE — Patient Instructions (Signed)
Dr. Christella Hartigan recommended upper Endoscopy with dilation for your swallowing. Please call if you are interested in this test after talking with your wife. For now chew well, eat slowly, take small bites.

## 2013-09-13 ENCOUNTER — Other Ambulatory Visit: Payer: Self-pay | Admitting: Internal Medicine

## 2013-09-28 ENCOUNTER — Ambulatory Visit (INDEPENDENT_AMBULATORY_CARE_PROVIDER_SITE_OTHER): Payer: 59 | Admitting: Internal Medicine

## 2013-09-28 ENCOUNTER — Encounter: Payer: Self-pay | Admitting: Internal Medicine

## 2013-09-28 VITALS — BP 130/86 | HR 53 | Temp 98.3°F | Ht 72.0 in | Wt 204.0 lb

## 2013-09-28 DIAGNOSIS — K5900 Constipation, unspecified: Secondary | ICD-10-CM

## 2013-09-28 DIAGNOSIS — I10 Essential (primary) hypertension: Secondary | ICD-10-CM

## 2013-09-28 DIAGNOSIS — Z23 Encounter for immunization: Secondary | ICD-10-CM

## 2013-09-28 NOTE — Patient Instructions (Signed)
Please try miralax and stool softner such as colace 100 mg twice per day starting prior and during travel  You should keep well hydrated, and can take an occasional dulcolox otc as well  Please continue all other medications as before, and refills have been done if requested.  Please have the pharmacy call with any other refills you may need.  Please keep your appointments with your specialists as you may have planned

## 2013-09-28 NOTE — Progress Notes (Signed)
Pre visit review using our clinic review tool, if applicable. No additional management support is needed unless otherwise documented below in the visit note. 

## 2013-09-28 NOTE — Progress Notes (Signed)
Subjective:    Patient ID: Thomas Caldwell, male    DOB: 08-Dec-1945, 68 y.o.   MRN: [id number]  HPI  Here to f/u, c/o lower abd pain every time now he travels to [place] on business, dull crampy, constipation like, occurred now last few months each time he is in [place] and just, did seem better with laxative last pm, usually last about 3-4 days to improved;  Flights are 24-30 hrs each way, flies monthly, only eats genuine chinese food when he is there, some nausea, but no vomiting, no fever but can feel mayalgias and "flu like", no blood, has to strain with pain and hard stools with BM when symptomatic. Pt denies chest pain, increased sob or doe, wheezing, orthopnea, PND, increased LE swelling, palpitations, dizziness or syncope. Past Medical History  Diagnosis Date  . ANEURYSM 02/02/2007    brain  . BRADYCARDIA, CHRONIC 10/06/2007  . ALLERGIC RHINITIS 01/11/2008  . GERD 07/05/2007  . HYPERLIPIDEMIA 02/02/2007  . HYPERTENSION 02/02/2007  . 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 DIVERTICULOSIS, COLON 07/05/2007  . 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 Lumbago 01/11/2008  . Peptic ulcer    Past Surgical History  Procedure Laterality Date  . Appendectomy  1982  . Sah left brain  1992    due to brain aneurysm s/p repair  . Lumbar disc surgery  12/2005    Dr. Venetia Maxon    reports that he has never smoked. He has never used smokeless tobacco. He reports that he drinks alcohol. He reports that he does not use illicit drugs. family history includes Esophageal cancer in his brother; Heart disease in his father and sister; Hypertension in his brother and father. Allergies  Allergen Reactions  . Codeine     REACTION: confusion   Current Outpatient Prescriptions on File Prior to Visit  Medication Sig Dispense Refill  . amLODipine (NORVASC) 5 MG tablet TAKE 1 TABLET BY MOUTH EVERY DAY  90 tablet  3  . aspirin 81 MG EC tablet Take 81 mg by mouth daily.        Marland Kitchen atenolol (TENORMIN)  25 MG tablet TAKE 1 TABLET BY MOUTH EVERY DAY  90 tablet  3  . Diclofenac Sodium (PENNSAID) 2 % SOLN Place 2 application onto the skin 2 (two) times daily.  112 g  3  . fluticasone (FLONASE) 50 MCG/ACT nasal spray Place 1 spray into both nostrils daily.  16 g  2  . meclizine (ANTIVERT) 12.5 MG tablet Take 1 tablet (12.5 mg total) by mouth 3 (three) times daily as needed for dizziness.  30 tablet  1  . nabumetone (RELAFEN) 500 MG tablet TAKE 1 TABLET BY MOUTH TWICE DAILY  60 tablet  6  . omeprazole (PRILOSEC) 20 MG capsule TAKE 2 CAPSULE BY MOUTH DAILY  180 capsule  3  . simvastatin (ZOCOR) 40 MG tablet Take 1 tablet (40 mg total) by mouth daily.  90 tablet  3  . traZODone (DESYREL) 50 MG tablet TAKE 1 TABLET BY MOUTH AS NEEDED      . triamcinolone cream (KENALOG) 0.5 % Apply 1 application topically 2 (two) times daily. To affected areas.  30 g  3   No current facility-administered medications on file prior to visit.   Review of Systems All otherwise neg per pt     Objective:   Physical Exam BP 130/86  Pulse 53  Temp(Src) 98.3 F (36.8 C) (Oral)  Ht 6' (1.829 m)  Wt 204 lb (92.534 kg)  BMI 27.66 kg/m2  SpO2 97% VS noted,  Constitutional: Pt appears well-developed, well-nourished.  HENT: Head: NCAT.  Right Ear: External ear normal.  Left Ear: External ear normal.  Eyes: . Pupils are equal, round, and reactive to light. Conjunctivae and EOM are normal Neck: Normal range of motion. Neck supple.  Cardiovascular: Normal rate and regular rhythm.   Pulmonary/Chest: Effort normal and breath sounds normal.  Abd:  Soft, NT, ND, + BS Neurological: Pt is alert. Not confused , motor grossly intact Skin: Skin is warm. No rash Psychiatric: Pt behavior is normal. No agitation.     Assessment & Plan:

## 2013-10-01 DIAGNOSIS — K5900 Constipation, unspecified: Secondary | ICD-10-CM | POA: Insufficient documentation

## 2013-10-01 NOTE — Assessment & Plan Note (Signed)
stable overall by history and exam, recent data reviewed with pt, and pt to continue medical treatment as before,  to f/u any worsening symptoms or concerns BP Readings from Last 3 Encounters:  09/28/13 130/86  08/29/13 132/72  08/21/13 124/82

## 2013-10-01 NOTE — Assessment & Plan Note (Signed)
Hx/exam c/w benign recurrent functional issue, for miralax daily, dulcolox prn,  Do not feel needs further lab/imaging for now, to f/u any worsening symptoms or concerns

## 2013-10-26 ENCOUNTER — Encounter: Payer: Self-pay | Admitting: Physician Assistant

## 2013-10-26 ENCOUNTER — Ambulatory Visit (INDEPENDENT_AMBULATORY_CARE_PROVIDER_SITE_OTHER): Payer: 59 | Admitting: Physician Assistant

## 2013-10-26 VITALS — BP 140/74 | HR 72 | Temp 97.9°F | Resp 18 | Wt 207.3 lb

## 2013-10-26 DIAGNOSIS — H6691 Otitis media, unspecified, right ear: Secondary | ICD-10-CM

## 2013-10-26 MED ORDER — AMOXICILLIN 500 MG PO CAPS: 500.0000 mg | ORAL_CAPSULE | Freq: Two times a day (BID) | ORAL | Status: DC

## 2013-10-26 NOTE — Progress Notes (Signed)
Pre visit review using our clinic review tool, if applicable. No additional management support is needed unless otherwise documented below in the visit note. 

## 2013-10-26 NOTE — Progress Notes (Signed)
Subjective:    Patient ID: Thomas Caldwell, male    DOB: 1945/10/22, 68 y.o.   MRN: [id number]  Otalgia  There is pain in the right ear. This is a new problem. The current episode started yesterday (pt was on long flight past 3 days). The problem occurs constantly. The problem has been gradually worsening. There has been no fever. The pain is at a severity of 7/10. Associated symptoms include headaches and a sore throat. Pertinent negatives include no abdominal pain, coughing, diarrhea, drainage, ear discharge, hearing loss, neck pain, rash, rhinorrhea or vomiting. Treatments tried: mucinex, nasal steroid. The treatment provided no relief. There is no history of a chronic ear infection, hearing loss or a tympanostomy tube.      Review of Systems  Constitutional: Negative for fever and chills.  HENT: Positive for ear pain and sore throat. Negative for ear discharge, hearing loss and rhinorrhea.   Respiratory: Negative for cough.   Cardiovascular: Negative for chest pain.  Gastrointestinal: Negative for nausea, vomiting, abdominal pain and diarrhea.  Musculoskeletal: Negative for neck pain.  Skin: Negative for rash.  Neurological: Positive for headaches. Negative for syncope.  All other systems reviewed and are negative.    Past Medical History  Diagnosis Date  . ANEURYSM 02/02/2007    brain  . BRADYCARDIA, CHRONIC 10/06/2007  . ALLERGIC RHINITIS 01/11/2008  . GERD 07/05/2007  . HYPERLIPIDEMIA 02/02/2007  . HYPERTENSION 02/02/2007  . 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 DIVERTICULOSIS, COLON 07/05/2007  . 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 Lumbago 01/11/2008  . Peptic ulcer     History   Social History  . Marital Status: Married    Spouse Name: N/A    Number of Children: 2  . Years of Education: N/A   Occupational History  . Patent examiner   Social History Main Topics  . Smoking status: Never Smoker   . Smokeless tobacco: Never  Used  . Alcohol Use: Yes     Comment: 0-1 per day  . Drug Use: No  . Sexual Activity: Yes    Birth Control/ Protection: None   Other Topics Concern  . Not on file   Social History Narrative  . No narrative on file    Past Surgical History  Procedure Laterality Date  . Appendectomy  1982  . Sah left brain  1992    due to brain aneurysm s/p repair  . Lumbar disc surgery  12/2005    Dr. Venetia Maxon    Family History  Problem Relation Age of Onset  . Hypertension Father   . Heart disease Father   . Heart disease Sister   . Hypertension Brother     3 bother with HTN  . Esophageal cancer Brother     Allergies  Allergen Reactions  . Codeine     REACTION: confusion    Current Outpatient Prescriptions on File Prior to Visit  Medication Sig Dispense Refill  . amLODipine (NORVASC) 5 MG tablet TAKE 1 TABLET BY MOUTH EVERY DAY  90 tablet  3  . aspirin 81 MG EC tablet Take 81 mg by mouth daily.        Marland Kitchen atenolol (TENORMIN) 25 MG tablet TAKE 1 TABLET BY MOUTH EVERY DAY  90 tablet  3  . Diclofenac Sodium (PENNSAID) 2 % SOLN Place 2 application onto the skin 2 (two) times daily.  112 g  3  . fluticasone (FLONASE) 50 MCG/ACT nasal spray Place 1 spray into both nostrils daily.  16 g  2  . meclizine (ANTIVERT) 12.5 MG tablet Take 1 tablet (12.5 mg total) by mouth 3 (three) times daily as needed for dizziness.  30 tablet  1  . nabumetone (RELAFEN) 500 MG tablet TAKE 1 TABLET BY MOUTH TWICE DAILY  60 tablet  6  . omeprazole (PRILOSEC) 20 MG capsule TAKE 2 CAPSULE BY MOUTH DAILY  180 capsule  3  . simvastatin (ZOCOR) 40 MG tablet Take 1 tablet (40 mg total) by mouth daily.  90 tablet  3  . traZODone (DESYREL) 50 MG tablet TAKE 1 TABLET BY MOUTH AS NEEDED      . triamcinolone cream (KENALOG) 0.5 % Apply 1 application topically 2 (two) times daily. To affected areas.  30 g  3   No current facility-administered medications on file prior to visit.    EXAM: BP 140/74  Pulse 72  Temp(Src)  97.9 F (36.6 C) (Oral)  Resp 18  Wt 207 lb 4.8 oz (94.031 kg)     Objective:   Physical Exam  Nursing note and vitals reviewed. Constitutional: He is oriented to person, place, and time. He appears well-developed and well-nourished. No distress.  HENT:  Head: Normocephalic and atraumatic.  Right Ear: External ear normal.  Left Ear: External ear normal.  Nose: Nose normal.  Mouth/Throat: No oropharyngeal exudate.  Oropharynx is slightly erythematous, no exudate. Left TM normal. Right TM bulging and erythematous. Bilateral frontal and maxillary sinuses non-TTP.  Eyes: Conjunctivae and EOM are normal. Pupils are equal, round, and reactive to light.  Neck: Normal range of motion. Neck supple.  Cardiovascular: Normal rate, regular rhythm and intact distal pulses.   Pulmonary/Chest: Effort normal and breath sounds normal. No stridor. No respiratory distress. He has no wheezes. He has no rales. He exhibits no tenderness.  Lymphadenopathy:    He has no cervical adenopathy.  Neurological: He is alert and oriented to person, place, and time.  Skin: Skin is warm and dry. No rash noted. He is not diaphoretic. No erythema. No pallor.  Psychiatric: He has a normal mood and affect. His behavior is normal. Judgment and thought content normal.    Lab Results  Component Value Date   WBC 7.9 06/26/2013   HGB 15.7 06/26/2013   HCT 46.9 06/26/2013   PLT 205.0 06/26/2013   GLUCOSE 102* 06/26/2013   CHOL 203* 06/26/2013   TRIG 113.0 06/26/2013   HDL 47.10 06/26/2013   LDLCALC 133* 06/26/2013   ALT 23 06/26/2013   AST 27 06/26/2013   NA 141 06/26/2013   K 5.5* 06/26/2013   CL 108 06/26/2013   CREATININE 1.2 06/26/2013   BUN 28* 06/26/2013   CO2 26 06/26/2013   TSH 4.42 06/26/2013   PSA 1.26 06/26/2013         Assessment & Plan:  Thomas Caldwell was seen today for right ear pain.  Diagnoses and associated orders for this visit:  Acute right otitis media, recurrence not specified, unspecified otitis media type Comments:  Mild disease, treat with amoxil, add nasal steroid, decongestant. Watchful waiting. - Discontinue: amoxicillin (AMOXIL) 500 MG capsule; Take 1 capsule (500 mg total) by mouth 2 (two) times daily. - amoxicillin (AMOXIL) 500 MG capsule; Take 1 capsule (500 mg total) by mouth 2 (two) times daily.   Also discussed taking Sudafed for the next few days with pt, in order to help relieve pain from pressure. Advised pt to not use this for a long period of time, however, as it can increase his  blood pressure. The pt is amenable to this plan.  Return precautions provided, and patient handout on otitis media.  Plan to follow up as needed, or for worsening or persistent symptoms despite treatment.  Patient Instructions  Amoxicillin twice dial for 7 days to treat ear infection.  Continue using the nasal steroid spray to help with congestion. (Over the counter Flonase and Nasacort are available if you run out).  You can also try Sudafed for the next few days to help decrease your congestion and hopefully relieve some of your pain. You do not need to take this long term as it will increase your blood pressure.  Take tylenol for the pain.  Push fluid hydration with water.  If emergency symptoms discussed during visit developed, seek medical attention immediately.  Followup as needed, or for worsening or persistent symptoms despite treatment.

## 2013-10-26 NOTE — Patient Instructions (Addendum)
Amoxicillin twice dial for 7 days to treat ear infection.  Continue using the nasal steroid spray to help with congestion. (Over the counter Flonase and Nasacort are available if you run out).  You can also try Sudafed for the next few days to help decrease your congestion and hopefully relieve some of your pain. You do not need to take this long term as it will increase your blood pressure.  Take tylenol for the pain.  Push fluid hydration with water.  If emergency symptoms discussed during visit developed, seek medical attention immediately.  Followup as needed, or for worsening or persistent symptoms despite treatment.     Otitis Media Otitis media is redness, soreness, and puffiness (swelling) in the space just behind your eardrum (middle ear). It may be caused by allergies or infection. It often happens along with a cold. HOME CARE  Take your medicine as told. Finish it even if you start to feel better.  Only take over-the-counter or prescription medicines for pain, discomfort, or fever as told by your doctor.  Follow up with your doctor as told. GET HELP IF:  You have otitis media only in one ear, or bleeding from your nose, or both.  You notice a lump on your neck.  You are not getting better in 3-5 days.  You feel worse instead of better. GET HELP RIGHT AWAY IF:   You have pain that is not helped with medicine.  You have puffiness, redness, or pain around your ear.  You get a stiff neck.  You cannot move part of your face (paralysis).  You notice that the bone behind your ear hurts when you touch it. MAKE SURE YOU:   Understand these instructions.  Will watch your condition.  Will get help right away if you are not doing well or get worse. Document Released: 07/01/2007 Document Revised: 01/17/2013 Document Reviewed: 08/09/2012 [facility] Patient Information 2015 [place], [place]. This information is not intended to replace advice given to you by your health  care provider. Make sure you discuss any questions you have with your health care provider.

## 2013-10-27 ENCOUNTER — Other Ambulatory Visit: Payer: Self-pay | Admitting: Internal Medicine

## 2014-01-21 ENCOUNTER — Other Ambulatory Visit: Payer: Self-pay | Admitting: Internal Medicine

## 2014-06-17 ENCOUNTER — Other Ambulatory Visit: Payer: Self-pay | Admitting: Internal Medicine

## 2014-06-27 ENCOUNTER — Other Ambulatory Visit (INDEPENDENT_AMBULATORY_CARE_PROVIDER_SITE_OTHER): Payer: 59

## 2014-06-27 DIAGNOSIS — Z0000 Encounter for general adult medical examination without abnormal findings: Secondary | ICD-10-CM

## 2014-06-27 LAB — URINALYSIS, ROUTINE W REFLEX MICROSCOPIC
BILIRUBIN URINE: NEGATIVE
Hgb urine dipstick: NEGATIVE
Ketones, ur: NEGATIVE
Leukocytes, UA: NEGATIVE
NITRITE: NEGATIVE
PH: 5.5 (ref 5.0–8.0)
RBC / HPF: NONE SEEN (ref 0–?)
Total Protein, Urine: NEGATIVE
URINE GLUCOSE: NEGATIVE
UROBILINOGEN UA: 0.2 (ref 0.0–1.0)

## 2014-06-27 LAB — CBC WITH DIFFERENTIAL/PLATELET
BASOS PCT: 0.5 % (ref 0.0–3.0)
Basophils Absolute: 0 10*3/uL (ref 0.0–0.1)
EOS ABS: 0.2 10*3/uL (ref 0.0–0.7)
EOS PCT: 2.7 % (ref 0.0–5.0)
HCT: 45.3 % (ref 39.0–52.0)
HEMOGLOBIN: 15 g/dL (ref 13.0–17.0)
Lymphocytes Relative: 22.9 % (ref 12.0–46.0)
Lymphs Abs: 1.9 10*3/uL (ref 0.7–4.0)
MCHC: 33.1 g/dL (ref 30.0–36.0)
MCV: 87.7 fl (ref 78.0–100.0)
MONO ABS: 0.9 10*3/uL (ref 0.1–1.0)
Monocytes Relative: 11.1 % (ref 3.0–12.0)
Neutro Abs: 5.1 10*3/uL (ref 1.4–7.7)
Neutrophils Relative %: 62.8 % (ref 43.0–77.0)
PLATELETS: 211 10*3/uL (ref 150.0–400.0)
RBC: 5.17 Mil/uL (ref 4.22–5.81)
RDW: 13.9 % (ref 11.5–15.5)
WBC: 8.2 10*3/uL (ref 4.0–10.5)

## 2014-06-27 LAB — LIPID PANEL
CHOL/HDL RATIO: 4
CHOLESTEROL: 167 mg/dL (ref 0–200)
HDL: 40.8 mg/dL (ref >39.00)
LDL Cholesterol: 102 mg/dL — ABNORMAL HIGH (ref 0–99)
NonHDL: 126.2
TRIGLYCERIDES: 120 mg/dL (ref 0.0–149.0)
VLDL: 24 mg/dL (ref 0.0–40.0)

## 2014-06-27 LAB — BASIC METABOLIC PANEL
BUN: 25 mg/dL — AB (ref 6–23)
CALCIUM: 9.4 mg/dL (ref 8.4–10.5)
CHLORIDE: 107 meq/L (ref 96–112)
CO2: 28 mEq/L (ref 19–32)
Creatinine, Ser: 1.15 mg/dL (ref 0.40–1.50)
GFR: 67.16 mL/min (ref >60.00)
GLUCOSE: 108 mg/dL — AB (ref 70–99)
Potassium: 5.2 mEq/L — ABNORMAL HIGH (ref 3.5–5.1)
Sodium: 139 mEq/L (ref 135–145)

## 2014-06-27 LAB — TSH: TSH: 2.68 u[IU]/mL (ref 0.35–4.50)

## 2014-06-27 LAB — HEPATIC FUNCTION PANEL
ALK PHOS: 66 U/L (ref 39–117)
ALT: 22 U/L (ref 0–53)
AST: 23 U/L (ref 0–37)
Albumin: 4.2 g/dL (ref 3.5–5.2)
BILIRUBIN DIRECT: 0.1 mg/dL (ref 0.0–0.3)
Total Bilirubin: 0.6 mg/dL (ref 0.2–1.2)
Total Protein: 6.7 g/dL (ref 6.0–8.3)

## 2014-06-27 LAB — PSA: PSA: 1.38 ng/mL (ref 0.10–4.00)

## 2014-07-04 ENCOUNTER — Ambulatory Visit (INDEPENDENT_AMBULATORY_CARE_PROVIDER_SITE_OTHER): Payer: 59 | Admitting: Internal Medicine

## 2014-07-04 ENCOUNTER — Encounter: Payer: Self-pay | Admitting: Internal Medicine

## 2014-07-04 VITALS — BP 122/78 | HR 56 | Temp 98.0°F | Wt 203.0 lb

## 2014-07-04 DIAGNOSIS — Z0000 Encounter for general adult medical examination without abnormal findings: Secondary | ICD-10-CM

## 2014-07-04 NOTE — Assessment & Plan Note (Signed)

## 2014-07-04 NOTE — Patient Instructions (Signed)

## 2014-07-04 NOTE — Progress Notes (Signed)
Subjective:    Patient ID: Thomas Caldwell, male    DOB: Aug 10, 1945, 69 y.o.   MRN: [id number]  HPI  Here for wellness and f/u;  Overall doing ok;  Pt denies Chest pain, worsening SOB, DOE, wheezing, orthopnea, PND, worsening LE edema, palpitations, dizziness or syncope.  Pt denies neurological change such as new headache, facial or extremity weakness.  Pt denies polydipsia, polyuria, or low sugar symptoms. Pt states overall good compliance with treatment and medications, good tolerability, and has been trying to follow appropriate diet.  Pt denies worsening depressive symptoms, suicidal ideation or panic. No fever, night sweats, wt loss, loss of appetite, or other constitutional symptoms.  Pt states good ability with ADL's, has low fall risk, home safety reviewed and adequate, no other significant changes in hearing or vision, and daily active with exercise with daily walks 4-5 miles.  Wt Readings from Last 3 Encounters:  07/04/14 203 lb (92.08 kg)  10/26/13 207 lb 4.8 oz (94.031 kg)  09/28/13 204 lb (92.534 kg)    Past Medical History  Diagnosis Date  . ANEURYSM 02/02/2007    brain  . BRADYCARDIA, CHRONIC 10/06/2007  . ALLERGIC RHINITIS 01/11/2008  . GERD 07/05/2007  . HYPERLIPIDEMIA 02/02/2007  . HYPERTENSION 02/02/2007  . 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 DIVERTICULOSIS, COLON 07/05/2007  . 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 Lumbago 01/11/2008  . Peptic ulcer    Past Surgical History  Procedure Laterality Date  . Appendectomy  1982  . Sah left brain  1992    due to brain aneurysm s/p repair  . Lumbar disc surgery  12/2005    Dr. Venetia Maxon    reports that he has never smoked. He has never used smokeless tobacco. He reports that he drinks alcohol. He reports that he does not use illicit drugs. family history includes Esophageal cancer in his brother; Heart disease in his father and sister; Hypertension in his brother and father. Allergies  Allergen  Reactions  . Codeine     REACTION: confusion   Current Outpatient Prescriptions on File Prior to Visit  Medication Sig Dispense Refill  . amLODipine (NORVASC) 5 MG tablet TAKE 1 TABLET BY MOUTH EVERY DAY 90 tablet 3  . aspirin 81 MG EC tablet Take 81 mg by mouth daily.      Marland Kitchen atenolol (TENORMIN) 25 MG tablet TAKE 1 TABLET BY MOUTH EVERY DAY 90 tablet 3  . fluticasone (FLONASE) 50 MCG/ACT nasal spray Place 1 spray into both nostrils daily. 16 g 3  . nabumetone (RELAFEN) 500 MG tablet TAKE 1 TABLET BY MOUTH TWICE DAILY 60 tablet 6  . omeprazole (PRILOSEC) 20 MG capsule TAKE 2 CAPSULE BY MOUTH DAILY 180 capsule 3  . simvastatin (ZOCOR) 40 MG tablet TAKE 1 TABLET BY MOUTH EVERY DAY 90 tablet 0  . traZODone (DESYREL) 50 MG tablet TAKE 1 TABLET BY MOUTH AS NEEDED    . triamcinolone cream (KENALOG) 0.5 % Apply 1 application topically 2 (two) times daily. To affected areas. (Patient not taking: Reported on 07/04/2014) 30 g 3   No current facility-administered medications on file prior to visit.   Review of Systems Constitutional: Negative for increased diaphoresis, other activity, appetite or siginficant weight change other than noted HENT: Negative for worsening hearing loss, ear pain, facial swelling, mouth sores and neck stiffness.   Eyes: Negative for other worsening pain, redness or visual disturbance.  Respiratory: Negative for shortness of breath and wheezing  Cardiovascular: Negative for chest pain and  palpitations.  Gastrointestinal: Negative for diarrhea, blood in stool, abdominal distention or other pain Genitourinary: Negative for hematuria, flank pain or change in urine volume.  Musculoskeletal: Negative for myalgias or other joint complaints.  Skin: Negative for color change and wound or drainage.  Neurological: Negative for syncope and numbness. other than noted Hematological: Negative for adenopathy. or other swelling Psychiatric/Behavioral: Negative for hallucinations, SI,  self-injury, decreased concentration or other worsening agitation.      Objective:   Physical Exam BP 122/78 mmHg  Pulse 56  Temp(Src) 98 F (36.7 C) (Oral)  Wt 203 lb (92.08 kg)  SpO2 95% VS noted,  Constitutional: Pt is oriented to person, place, and time. Appears well-developed and well-nourished, in no significant distress Head: Normocephalic and atraumatic.  Right Ear: External ear normal.  Left Ear: External ear normal.  Nose: Nose normal.  Mouth/Throat: Oropharynx is clear and moist.  Eyes: Conjunctivae and EOM are normal. Pupils are equal, round, and reactive to light.  Neck: Normal range of motion. Neck supple. No JVD present. No tracheal deviation present or significant neck LA or mass Cardiovascular: Normal rate, regular rhythm, normal heart sounds and intact distal pulses.   Pulmonary/Chest: Effort normal and breath sounds without rales or wheezing  Abdominal: Soft. Bowel sounds are normal. NT. No HSM  Musculoskeletal: Normal range of motion. Exhibits no edema.  Lymphadenopathy:  Has no cervical adenopathy.  Neurological: Pt is alert and oriented to person, place, and time. Pt has normal reflexes. No cranial nerve deficit. Motor grossly intact Skin: Skin is warm and dry. No rash noted.  Psychiatric:  Has normal mood and affect. Behavior is normal.     Assessment & Plan:

## 2014-07-04 NOTE — Progress Notes (Signed)
Pre visit review using our clinic review tool, if applicable. No additional management support is needed unless otherwise documented below in the visit note. 

## 2014-07-05 ENCOUNTER — Ambulatory Visit (INDEPENDENT_AMBULATORY_CARE_PROVIDER_SITE_OTHER): Payer: 59 | Admitting: Family Medicine

## 2014-07-05 ENCOUNTER — Other Ambulatory Visit (INDEPENDENT_AMBULATORY_CARE_PROVIDER_SITE_OTHER): Payer: 59

## 2014-07-05 ENCOUNTER — Encounter: Payer: Self-pay | Admitting: Family Medicine

## 2014-07-05 VITALS — BP 122/78 | HR 53 | Ht 72.0 in | Wt 203.0 lb

## 2014-07-05 DIAGNOSIS — M7661 Achilles tendinitis, right leg: Secondary | ICD-10-CM | POA: Insufficient documentation

## 2014-07-05 DIAGNOSIS — M25572 Pain in left ankle and joints of left foot: Secondary | ICD-10-CM

## 2014-07-05 NOTE — Progress Notes (Signed)
[name] [address] [place], [place] [address] Phone: [phone] Subjective:     CC: Right ankle pain  [id number] Thomas Caldwell is a 69 y.o. male coming in with complaint of right ankle pain.  Patient states that this is been going on for multiple months. States that it seems to be worsening over the course of time. Patient states on Sunday night it kept him up all night. Patient describes the pain as a dull throbbing aching sensation. Denies any injury. On the posterior aspect near the Achilles. Patient has been able to play golf and do other activity. Patient has not had this pain previously. Patient rates the severity of pain a 6 out of 10. Denies any radiation of pain or any numbness.     Past medical history, social, surgical and family history all reviewed in electronic medical record.   Review of Systems: No headache, visual changes, nausea, vomiting, diarrhea, constipation, dizziness, abdominal pain, skin rash, fevers, chills, night sweats, weight loss, swollen lymph nodes, body aches, joint swelling, muscle aches, chest pain, shortness of breath, mood changes.   Objective Blood pressure 122/78, pulse 53, height 6' (1.829 m), weight 203 lb (92.08 kg), SpO2 96 %.  General: No apparent distress alert and oriented x3 mood and affect normal, dressed appropriately.  HEENT: Pupils equal, extraocular movements intact  Respiratory: Patient's speak in full sentences and does not appear short of breath  Cardiovascular: No lower extremity edema, non tender, no erythema  Skin: Warm dry intact with no signs of infection or rash on extremities or on axial skeleton.  Abdomen: Soft nontender  Neuro: Cranial nerves II through XII are intact, neurovascularly intact in all extremities with 2+ DTRs and 2+ pulses.  Lymph: No lymphadenopathy of posterior or anterior cervical chain or axillae bilaterally.  Gait normal with good balance and coordination.  MSK:   Non tender with full range of motion and good stability and symmetric strength and tone of shoulders, elbows, wrist, hip, knee and bilaterally.  Ankle: Right Nodule noted over patient's Achilles tendon Range of motion is full in all directions. Strength is 5/5 in all directions. Stable lateral and medial ligaments; squeeze test and kleiger test unremarkable; Talar dome nontender; No pain at base of 5th MT; No tenderness over cuboid; No tenderness over N spot or navicular prominence No tenderness on posterior aspects of lateral and medial malleolus No sign of peroneal tendon subluxations or tenderness to palpation Achilles tendon is minimally tender to palpation over the nodule but not at insertion. Negative tarsal tunnel tinel's Able to walk 4 steps. COntralateral ankle unremarkable.  MSK US performed of: Right ankle This study was ordered, performed, and interpreted by Terrilee Files D.O.  Foot/Ankle:   All structures visualized.   Talar dome unremarkable  Ankle mortise without effusion. Peroneus longus and brevis tendons unremarkable on long and transverse views without sheath effusions. Posterior tibialis, flexor hallucis longus, and flexor digitorum longus tendons unremarkable on long and transverse views without sheath effusions. Achilles tendon visualized along length of tendon no hypoechoic changes noted at insertion and no tearing noted. Patient does have a nodule approximately 2 cm proximal to insertion. Mild hypoechoic changes and mild increase in Doppler flow. Anterior Talofibular Ligament and Calcaneofibular Ligaments unremarkable and intact. Deltoid Ligament unremarkable and intact. Plantar fascia intact and without effusion, normal thickness. No increased doppler signal, cap sign, or thickening of tibial cortex. Power doppler signal normal.  IMPRESSION:  Achilles tendinosis  Impression and Recommendations:     This case required medical decision making of moderate  complexity.

## 2014-07-05 NOTE — Assessment & Plan Note (Signed)
No tear appreciated. Patient given a brace as well as topical anti-inflammatory's. We discussed icing regimen as well as home exercises. Patient did work with Event organiser today. Patient will avoid certain activities as well as wear proper shoes. We discussed the possibility of a heel lift. Patient will come back and see me again in 3 weeks to make sure that he is responding.  Spent  25 minutes with patient face-to-face and had greater than 50% of counseling including as described above in assessment and plan.

## 2014-07-05 NOTE — Progress Notes (Signed)
Pre visit review using our clinic review tool, if applicable. No additional management support is needed unless otherwise documented below in the visit note. 

## 2014-07-05 NOTE — Patient Instructions (Signed)
Good to see you.  Ice 20 minutes 2 times daily. Usually after activity and before bed. Exercises 3 times a week.  pennsaid pinkie amount topically 2 times daily as needed.  Heel lift right shoe Try brace See me again in 3-4 weeks or call me sooner if it worsens.

## 2014-07-09 ENCOUNTER — Other Ambulatory Visit: Payer: Self-pay | Admitting: Internal Medicine

## 2014-08-07 ENCOUNTER — Other Ambulatory Visit: Payer: Self-pay | Admitting: Internal Medicine

## 2014-08-10 ENCOUNTER — Other Ambulatory Visit: Payer: Self-pay | Admitting: Internal Medicine

## 2014-09-07 ENCOUNTER — Other Ambulatory Visit: Payer: Self-pay | Admitting: Internal Medicine

## 2014-09-13 ENCOUNTER — Other Ambulatory Visit: Payer: Self-pay | Admitting: Internal Medicine

## 2014-09-25 ENCOUNTER — Other Ambulatory Visit: Payer: Self-pay

## 2014-09-25 MED ORDER — FLUTICASONE PROPIONATE 50 MCG/ACT NA SUSP: 1.0000 | Freq: Every day | NASAL | Status: DC

## 2014-12-13 ENCOUNTER — Other Ambulatory Visit: Payer: Self-pay | Admitting: Family Medicine

## 2014-12-13 NOTE — Telephone Encounter (Signed)
Refill done.  

## 2014-12-26 ENCOUNTER — Encounter: Payer: Self-pay | Admitting: Internal Medicine

## 2014-12-26 MED ORDER — LEVOFLOXACIN 250 MG PO TABS: 250.0000 mg | ORAL_TABLET | Freq: Every day | ORAL | Status: DC

## 2015-01-10 ENCOUNTER — Ambulatory Visit (INDEPENDENT_AMBULATORY_CARE_PROVIDER_SITE_OTHER): Payer: 59 | Admitting: Family Medicine

## 2015-01-10 ENCOUNTER — Encounter: Payer: Self-pay | Admitting: Family Medicine

## 2015-01-10 ENCOUNTER — Other Ambulatory Visit (INDEPENDENT_AMBULATORY_CARE_PROVIDER_SITE_OTHER): Payer: 59

## 2015-01-10 ENCOUNTER — Telehealth: Payer: Self-pay | Admitting: Internal Medicine

## 2015-01-10 VITALS — BP 130/76 | HR 57 | Ht 72.0 in | Wt 209.0 lb

## 2015-01-10 DIAGNOSIS — M25571 Pain in right ankle and joints of right foot: Secondary | ICD-10-CM

## 2015-01-10 DIAGNOSIS — M7661 Achilles tendinitis, right leg: Secondary | ICD-10-CM

## 2015-01-10 NOTE — Telephone Encounter (Signed)
Pt wants to know if he is due for a colonoscopy?  He stated when he was in the office to see Dr. Katrinka Blazing, he had gotten a call from another office to schedule one??  Please let pt know via My Chart your response to this question.

## 2015-01-10 NOTE — Progress Notes (Signed)
Pre visit review using our clinic review tool, if applicable. No additional management support is needed unless otherwise documented below in the visit note. 

## 2015-01-10 NOTE — Telephone Encounter (Signed)
Pt advised via MyChart. 

## 2015-01-10 NOTE — Assessment & Plan Note (Signed)
Patient does have more of a tendinitis noted. I do think it is seems to be worsening with some mild intersubstance tearing. We discussed with patient to go back into a [employer]. Patient will do topical anti-inflammatories and was given oral prednisone. I'm hoping that this will be beneficial. We discussed trying to limit his activity if possible. Patient does have a golf trip scheduled in the near future. Patient will come back before the trip and we'll make sure that he is healing appropriately.  Spent  25 minutes with patient face-to-face and had greater than 50% of counseling including as described above in assessment and plan.

## 2015-01-10 NOTE — Progress Notes (Signed)
[name] [address] [place], [place] [address] Phone: [phone] Subjective:     CC: Right ankle pain f/u  [id number] Thomas Caldwell is a 69 y.o. male coming in with complaint of right ankle pain.  Patient states that this is been going on for multiple months. Patient was to start a brace to do home exercises. Patient has not been doing the home exercises as regularly. Patient is been somewhat noncompliant. Patient did have a do a lot [place] the [place]. Patient states that it seems to be any worse. Denies any improvement.  Did take a step wrong and had worsening pain last week.  Walks with a limp.    Past medical history, social, surgical and family history all reviewed in electronic medical record.   Review of Systems: No headache, visual changes, nausea, vomiting, diarrhea, constipation, dizziness, abdominal pain, skin rash, fevers, chills, night sweats, weight loss, swollen lymph nodes, body aches, joint swelling, muscle aches, chest pain, shortness of breath, mood changes.   Objective Blood pressure 130/76, pulse 57, height 6' (1.829 m), weight 209 lb (94.802 kg), SpO2 98 %.  General: No apparent distress alert and oriented x3 mood and affect normal, dressed appropriately.  HEENT: Pupils equal, extraocular movements intact  Respiratory: Patient's speak in full sentences and does not appear short of breath  Cardiovascular: No lower extremity edema, non tender, no erythema  Skin: Warm dry intact with no signs of infection or rash on extremities or on axial skeleton.  Abdomen: Soft nontender  Neuro: Cranial nerves II through XII are intact, neurovascularly intact in all extremities with 2+ DTRs and 2+ pulses.  Lymph: No lymphadenopathy of posterior or anterior cervical chain or axillae bilaterally.  Gait normal with good balance and coordination.  MSK:  Non tender with full range of motion and good stability and symmetric  strength and tone of shoulders, elbows, wrist, hip, knee and bilaterally.  Ankle: Right Nodule noted over patient's Achilles tendon and very tender Range of motion is full in all directions. Strength is 5/5 in all directions. Stable lateral and medial ligaments; squeeze test and kleiger test unremarkable; Talar dome nontender; No pain at base of 5th MT; No tenderness over cuboid; No tenderness over N spot or navicular prominence No tenderness on posterior aspects of lateral and medial malleolus No sign of peroneal tendon subluxations or tenderness to palpation Negative tarsal tunnel tinel's Able to walk 4 steps. Contralateral ankle unremarkable.  MSK US performed of: Right ankle This study was ordered, performed, and interpreted by Terrilee Files D.O.  Foot/Ankle:   All structures visualized.   Talar dome unremarkable  Ankle mortise without effusion. Peroneus longus and brevis tendons unremarkable on long and transverse views without sheath effusions. Posterior tibialis, flexor hallucis longus, and flexor digitorum longus tendons unremarkable on long and transverse views without sheath effusions. Achilles tendon visualized along length of tendon no hypoechoic changes noted at insertion and no tearing noted. Patient does have a nodule approximately 2 cm proximal to insertion. Increasing in size and hypoechoic changes, mild intersubstance tearing. Anterior Talofibular Ligament and Calcaneofibular Ligaments unremarkable and intact. Deltoid Ligament unremarkable and intact. Plantar fascia intact and without effusion, normal thickness. No increased doppler signal, cap sign, or thickening of tibial cortex. Power doppler signal increased.   IMPRESSION:  Achilles tendinosis, severe with increasing swelling.      Impression and Recommendations:     This case required medical decision making of moderate complexity.

## 2015-01-10 NOTE — Patient Instructions (Addendum)
One of my favorites  In the boot until I see you again Ice as much as you want Prednisone daily for next 5 days(please tell your wife I am sorry) Pennsaid as you need it.  See me again before the golf trip.   Happy holidays!

## 2015-01-14 ENCOUNTER — Encounter: Payer: Self-pay | Admitting: Family Medicine

## 2015-01-14 MED ORDER — PREDNISONE 50 MG PO TABS: 50.0000 mg | ORAL_TABLET | Freq: Every day | ORAL | Status: DC

## 2015-01-14 NOTE — Telephone Encounter (Signed)
Don erx 

## 2015-02-06 ENCOUNTER — Other Ambulatory Visit: Payer: Self-pay | Admitting: Internal Medicine

## 2015-02-15 ENCOUNTER — Ambulatory Visit (INDEPENDENT_AMBULATORY_CARE_PROVIDER_SITE_OTHER): Payer: 59 | Admitting: Family Medicine

## 2015-02-15 ENCOUNTER — Encounter: Payer: Self-pay | Admitting: Family Medicine

## 2015-02-15 ENCOUNTER — Ambulatory Visit: Payer: Self-pay | Admitting: Family Medicine

## 2015-02-15 ENCOUNTER — Other Ambulatory Visit (INDEPENDENT_AMBULATORY_CARE_PROVIDER_SITE_OTHER): Payer: 59

## 2015-02-15 VITALS — BP 112/82 | HR 54 | Ht 72.0 in | Wt 209.0 lb

## 2015-02-15 DIAGNOSIS — M7661 Achilles tendinitis, right leg: Secondary | ICD-10-CM | POA: Diagnosis not present

## 2015-02-15 MED ORDER — DICLOFENAC SODIUM 2 % TD SOLN: 2.0000 "application " | Freq: Two times a day (BID) | TRANSDERMAL | Status: DC

## 2015-02-15 MED ORDER — NITROGLYCERIN 0.1 MG/HR TD PT24: MEDICATED_PATCH | TRANSDERMAL | Status: DC

## 2015-02-15 MED ORDER — PREDNISONE 50 MG PO TABS: 50.0000 mg | ORAL_TABLET | Freq: Every day | ORAL | Status: DC

## 2015-02-15 NOTE — Progress Notes (Signed)
[name] [address] [place], [place] [address] Phone: [phone] Subjective:     CC: Right ankle pain f/u  [id number] Thomas Caldwell is a 70 y.o. male coming in with complaint of right ankle pain.  Patient states that this is been going on for multiple months. P was seen and has been diagnosed with an Achilles tendinosis. Patient has been wearing the brace and a regular basis but not been doing the exercises. States though that the pain seems to be worsening. Not getting any better. May be walking a little bit better but when he sits it is more severely painful when he gets up. Patient has trip leaving next week for a golfing trip for 10 days.    Past medical history, social, surgical and family history all reviewed in electronic medical record.   Review of Systems: No headache, visual changes, nausea, vomiting, diarrhea, constipation, dizziness, abdominal pain, skin rash, fevers, chills, night sweats, weight loss, swollen lymph nodes, body aches, joint swelling, muscle aches, chest pain, shortness of breath, mood changes.   Objective Blood pressure 112/82, pulse 54, height 6' (1.829 m), weight 209 lb (94.802 kg), SpO2 96 %.  General: No apparent distress alert and oriented x3 mood and affect normal, dressed appropriately.  HEENT: Pupils equal, extraocular movements intact  Respiratory: Patient's speak in full sentences and does not appear short of breath  Cardiovascular: No lower extremity edema, non tender, no erythema  Skin: Warm dry intact with no signs of infection or rash on extremities or on axial skeleton.  Abdomen: Soft nontender  Neuro: Cranial nerves II through XII are intact, neurovascularly intact in all extremities with 2+ DTRs and 2+ pulses.  Lymph: No lymphadenopathy of posterior or anterior cervical chain or axillae bilaterally.  Gait normal with good balance and coordination.  MSK:  Non tender with full range of motion and good  stability and symmetric strength and tone of shoulders, elbows, wrist, hip, knee and bilaterally.  Ankle: Right Nodule noted over patient's Achilles tendon and still tender Range of motion is full in all directions. Strength is 5/5 in all directions. Stable lateral and medial ligaments; squeeze test and kleiger test unremarkable; Talar dome nontender; No pain at base of 5th MT; No tenderness over cuboid; No tenderness over N spot or navicular prominence No tenderness on posterior aspects of lateral and medial malleolus No sign of peroneal tendon subluxations or tenderness to palpation Negative tarsal tunnel tinel's Able to walk 4 steps. Contralateral ankle unremarkable.  MSK US performed of: Right ankle This study was ordered, performed, and interpreted by Terrilee Files D.O.  Foot/Ankle:   All structures visualized.   Talar dome unremarkable  Ankle mortise without effusion. Peroneus longus and brevis tendons unremarkable on long and transverse views without sheath effusions. Posterior tibialis, flexor hallucis longus, and flexor digitorum longus tendons unremarkable on long and transverse views without sheath effusions. Achilles tendon visualized along length of tendon no hypoechoic changes noted at insertion and no tearing noted. Patient does have a nodule approximately 2 cm proximal to insertion. Increasing in size and hypoechoic changes still from previous exam, mild intersubstance tearing. Patient does have some retrocalcaneal bursitis noted today as well. Anterior Talofibular Ligament and Calcaneofibular Ligaments unremarkable and intact. Deltoid Ligament unremarkable and intact. Plantar fascia intact and without effusion, normal thickness. No increased doppler signal, cap sign, or thickening of tibial cortex. Power doppler signal increased.   IMPRESSION:  Achilles tendinosis, severe with increasing swelling and increased  inflammation     Impression and Recommendations:      This case required medical decision making of moderate complexity.

## 2015-02-15 NOTE — Assessment & Plan Note (Signed)
No significant improvement from previous exam. We discussed at great length about different treatment options. Patient is not seem to be responding to conservative therapy. Patient travels too much for going to formal physical therapy. Patient does not want to wear Cam Walker to decrease inflammation. I do not feel that an injection within the tendon sheath would be beneficial. I think this can increase his risk of rupture. Patient has decided to try the natural closure patch. Warned of potential side effects especially with patient's age. Patient though does have no signs of hypertension and heart rate is in the 50s. Patient knows that if any of the side effects occurs he needs to stop it immediately. Patient come back and see me again in 3 weeks for further evaluation. Patient was prescribed prednisone again to allow him to get through his next golfing trip

## 2015-02-15 NOTE — Progress Notes (Signed)
Pre visit review using our clinic review tool, if applicable. No additional management support is needed unless otherwise documented below in the visit note. 

## 2015-02-15 NOTE — Patient Instructions (Addendum)
Good to see you  Prednisone daily for 5 days.l  Wear the largest heel lift you have Wear what brace feels good Keep doing the exercises  Nitroglycerin Protocol   Apply 1/4 nitroglycerin patch to affected area daily.  Change position of patch within the affected area every 24 hours.  You may experience a headache during the first 1-2 weeks of using the patch, these should subside.  If you experience headaches after beginning nitroglycerin patch treatment, you may take your preferred over the counter pain reliever.  Another side effect of the nitroglycerin patch is skin irritation or rash related to patch adhesive.  Please notify our office if you develop more severe headaches or rash, and stop the patch.  Tendon healing with nitroglycerin patch may require 12 to 24 weeks depending on the extent of injury.  Men should not use if taking Viagra, Cialis, or Levitra.   Do not use if you have migraines or rosacea.  See me again in 2-3 weeks.

## 2015-03-06 ENCOUNTER — Other Ambulatory Visit: Payer: Self-pay | Admitting: Internal Medicine

## 2015-03-14 ENCOUNTER — Other Ambulatory Visit: Payer: Self-pay | Admitting: Internal Medicine

## 2015-04-18 ENCOUNTER — Encounter: Payer: Self-pay | Admitting: Family Medicine

## 2015-04-18 ENCOUNTER — Ambulatory Visit (INDEPENDENT_AMBULATORY_CARE_PROVIDER_SITE_OTHER): Payer: 59 | Admitting: Family Medicine

## 2015-04-18 ENCOUNTER — Other Ambulatory Visit (INDEPENDENT_AMBULATORY_CARE_PROVIDER_SITE_OTHER): Payer: 59

## 2015-04-18 VITALS — BP 126/80 | HR 66 | Wt 207.0 lb

## 2015-04-18 DIAGNOSIS — M67879 Other specified disorders of synovium and tendon, unspecified ankle and foot: Secondary | ICD-10-CM | POA: Diagnosis not present

## 2015-04-18 DIAGNOSIS — Z23 Encounter for immunization: Secondary | ICD-10-CM | POA: Diagnosis not present

## 2015-04-18 DIAGNOSIS — M7660 Achilles tendinitis, unspecified leg: Secondary | ICD-10-CM

## 2015-04-18 DIAGNOSIS — M7661 Achilles tendinitis, right leg: Secondary | ICD-10-CM | POA: Diagnosis not present

## 2015-04-18 NOTE — Progress Notes (Signed)
[name] [address] [place], [place] [address] Phone: [phone] Subjective:     CC: Right ankle pain f/u  [id number] Thomas Caldwell is a 70 y.o. male coming in with complaint of right ankle pain.  Patient was seen previously for an Achilles tendinosis. Patient was having increasing pain and patient was given prednisone as well as nitroglycerin patches. Patient was also trying topical anti-inflammatories and doing some home exercises. Patient states he is doing approximately 10-20% better. States that he is been doing the exercises occasionally. Patient continues to take it. Patient is not wearing the compression socks as regularly. He is doing the nitroglycerin without any side effects. Continues to stay very active. States that the swelling is still there.  Past Medical History  Diagnosis Date  . ANEURYSM 02/02/2007    brain  . BRADYCARDIA, CHRONIC 10/06/2007  . ALLERGIC RHINITIS 01/11/2008  . GERD 07/05/2007  . HYPERLIPIDEMIA 02/02/2007  . HYPERTENSION 02/02/2007  . 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 DIVERTICULOSIS, COLON 07/05/2007  . 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 Lumbago 01/11/2008  . Peptic ulcer    Past Surgical History  Procedure Laterality Date  . Appendectomy  1982  . Sah left brain  1992    due to brain aneurysm s/p repair  . Lumbar disc surgery  12/2005    Dr. Venetia Maxon   ,soc Allergies  Allergen Reactions  . Codeine     REACTION: confusion   Mr. Heydon does not currently have medications on file. Family History  Problem Relation Age of Onset  . Hypertension Father   . Heart disease Father   . Heart disease Sister   . Hypertension Brother     3 bother with HTN  . Esophageal cancer Brother        Past medical history, social, surgical and family history all reviewed in electronic medical record.   Review of Systems: No headache, visual changes, nausea, vomiting, diarrhea,  constipation, dizziness, abdominal pain, skin rash, fevers, chills, night sweats, weight loss, swollen lymph nodes, body aches, joint swelling, muscle aches, chest pain, shortness of breath, mood changes.   Objective There were no vitals taken for this visit.  General: No apparent distress alert and oriented x3 mood and affect normal, dressed appropriately.  HEENT: Pupils equal, extraocular movements intact  Respiratory: Patient's speak in full sentences and does not appear short of breath  Cardiovascular: No lower extremity edema, non tender, no erythema  Skin: Warm dry intact with no signs of infection or rash on extremities or on axial skeleton.  Abdomen: Soft nontender  Neuro: Cranial nerves II through XII are intact, neurovascularly intact in all extremities with 2+ DTRs and 2+ pulses.  Lymph: No lymphadenopathy of posterior or anterior cervical chain or axillae bilaterally.  Gait normal with good balance and coordination.  MSK:  Non tender with full range of motion and good stability and symmetric strength and tone of shoulders, elbows, wrist, hip, knee and bilaterally.  Ankle: Right Nodule noted over patient's Achilles tendon and still tender Range of motion is full in all directions. Strength is 5/5 in all directions. Stable lateral and medial ligaments; squeeze test and kleiger test unremarkable; Talar dome nontender; No pain at base of 5th MT; No tenderness over cuboid; No tenderness over N spot or navicular prominence No tenderness on posterior aspects of lateral and medial malleolus No sign of peroneal tendon subluxations or tenderness to palpation Negative tarsal tunnel tinel's Able to walk 4  steps. Contralateral ankle unremarkable.  MSK US performed of: Right ankle This study was ordered, performed, and interpreted by Terrilee Files D.O.  Foot/Ankle:   All structures visualized.   Talar dome unremarkable  Ankle mortise without effusion. Peroneus longus and brevis tendons  unremarkable on long and transverse views without sheath effusions. Posterior tibialis, flexor hallucis longus, and flexor digitorum longus tendons unremarkable on long and transverse views without sheath effusions. Achilles tendon visualized along length of tendon no hypoechoic changes noted at insertion and no tearing noted. Patient does have a nodule approximately 2 cm proximal to insertion.Improvement noted from previous exam. Patient does have some increasing Doppler flow still noted.. Patient does have some retrocalcaneal bursitis noted today as well. Anterior Talofibular Ligament and Calcaneofibular Ligaments unremarkable and intact. Deltoid Ligament unremarkable and intact. Plantar fascia intact and without effusion, normal thickness. No increased doppler signal, cap sign, or thickening of tibial cortex. Power doppler signal increased.   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

## 2015-04-18 NOTE — Patient Instructions (Signed)
Always good to see you  Better but not perfect  Keep taping Continue the nitro Ice still after activity  Heel lift 1/4 inch in the shoe at all times See me again in 6 weeks.

## 2015-04-18 NOTE — Assessment & Plan Note (Signed)
Still severe at this time. We discussed Achilles tendinosis. We discussed icing regimen and home exercises. We discussed which activities to do an which was potentially avoid. We will continue same regimen at this time but discussed with patient to add an heel lift. We discussed which activities to avoid. Patient will come back and see me again in 6 weeks.  Spent  25 minutes with patient face-to-face and had greater than 50% of counseling including as described above in assessment and plan.

## 2015-05-02 ENCOUNTER — Other Ambulatory Visit: Payer: Self-pay | Admitting: Family Medicine

## 2015-05-06 NOTE — Telephone Encounter (Signed)
refill done 

## 2015-06-04 ENCOUNTER — Other Ambulatory Visit: Payer: Self-pay | Admitting: Internal Medicine

## 2015-06-08 ENCOUNTER — Other Ambulatory Visit: Payer: Self-pay | Admitting: Internal Medicine

## 2015-06-25 ENCOUNTER — Telehealth: Payer: Self-pay | Admitting: Internal Medicine

## 2015-06-25 NOTE — Telephone Encounter (Signed)
Had to reschedule patient CPE for 6/15.  Please extend labs to then.

## 2015-07-05 ENCOUNTER — Encounter: Payer: 59 | Admitting: Internal Medicine

## 2015-07-09 ENCOUNTER — Other Ambulatory Visit (INDEPENDENT_AMBULATORY_CARE_PROVIDER_SITE_OTHER): Payer: 59

## 2015-07-09 DIAGNOSIS — Z0000 Encounter for general adult medical examination without abnormal findings: Secondary | ICD-10-CM | POA: Diagnosis not present

## 2015-07-09 LAB — CBC WITH DIFFERENTIAL/PLATELET
BASOS ABS: 0.1 10*3/uL (ref 0.0–0.1)
Basophils Relative: 0.9 % (ref 0.0–3.0)
EOS ABS: 0.2 10*3/uL (ref 0.0–0.7)
Eosinophils Relative: 3.5 % (ref 0.0–5.0)
HEMATOCRIT: 44.7 % (ref 39.0–52.0)
Hemoglobin: 15 g/dL (ref 13.0–17.0)
LYMPHS PCT: 29.2 % (ref 12.0–46.0)
Lymphs Abs: 1.9 10*3/uL (ref 0.7–4.0)
MCHC: 33.7 g/dL (ref 30.0–36.0)
MCV: 86.2 fl (ref 78.0–100.0)
MONOS PCT: 11.2 % (ref 3.0–12.0)
Monocytes Absolute: 0.7 10*3/uL (ref 0.1–1.0)
NEUTROS PCT: 55.2 % (ref 43.0–77.0)
Neutro Abs: 3.6 10*3/uL (ref 1.4–7.7)
Platelets: 220 10*3/uL (ref 150.0–400.0)
RBC: 5.18 Mil/uL (ref 4.22–5.81)
RDW: 14.4 % (ref 11.5–15.5)
WBC: 6.6 10*3/uL (ref 4.0–10.5)

## 2015-07-09 LAB — URINALYSIS, ROUTINE W REFLEX MICROSCOPIC
BILIRUBIN URINE: NEGATIVE
Hgb urine dipstick: NEGATIVE
KETONES UR: NEGATIVE
LEUKOCYTES UA: NEGATIVE
Nitrite: NEGATIVE
PH: 5.5 (ref 5.0–8.0)
RBC / HPF: NONE SEEN (ref 0–?)
Specific Gravity, Urine: 1.025 (ref 1.000–1.030)
Total Protein, Urine: NEGATIVE
URINE GLUCOSE: NEGATIVE
UROBILINOGEN UA: 0.2 (ref 0.0–1.0)
WBC UA: NONE SEEN (ref 0–?)

## 2015-07-09 LAB — HEPATIC FUNCTION PANEL
ALBUMIN: 4.2 g/dL (ref 3.5–5.2)
ALT: 25 U/L (ref 0–53)
AST: 25 U/L (ref 0–37)
Alkaline Phosphatase: 71 U/L (ref 39–117)
Bilirubin, Direct: 0.1 mg/dL (ref 0.0–0.3)
TOTAL PROTEIN: 6.9 g/dL (ref 6.0–8.3)
Total Bilirubin: 0.4 mg/dL (ref 0.2–1.2)

## 2015-07-09 LAB — LIPID PANEL
CHOL/HDL RATIO: 4
CHOLESTEROL: 170 mg/dL (ref 0–200)
HDL: 41.6 mg/dL (ref >39.00)
LDL Cholesterol: 110 mg/dL — ABNORMAL HIGH (ref 0–99)
NONHDL: 127.92
TRIGLYCERIDES: 91 mg/dL (ref 0.0–149.0)
VLDL: 18.2 mg/dL (ref 0.0–40.0)

## 2015-07-09 LAB — BASIC METABOLIC PANEL
BUN: 24 mg/dL — AB (ref 6–23)
CALCIUM: 9.5 mg/dL (ref 8.4–10.5)
CO2: 27 meq/L (ref 19–32)
CREATININE: 1.19 mg/dL (ref 0.40–1.50)
Chloride: 106 mEq/L (ref 96–112)
GFR: 64.36 mL/min (ref >60.00)
GLUCOSE: 108 mg/dL — AB (ref 70–99)
Potassium: 5.3 mEq/L — ABNORMAL HIGH (ref 3.5–5.1)
Sodium: 140 mEq/L (ref 135–145)

## 2015-07-09 LAB — TSH: TSH: 3.57 u[IU]/mL (ref 0.35–4.50)

## 2015-07-09 LAB — PSA: PSA: 1.04 ng/mL (ref 0.10–4.00)

## 2015-07-11 ENCOUNTER — Ambulatory Visit (INDEPENDENT_AMBULATORY_CARE_PROVIDER_SITE_OTHER): Payer: 59 | Admitting: Internal Medicine

## 2015-07-11 ENCOUNTER — Encounter: Payer: Self-pay | Admitting: Internal Medicine

## 2015-07-11 VITALS — BP 130/68 | HR 53 | Temp 98.3°F | Resp 20 | Wt 208.0 lb

## 2015-07-11 DIAGNOSIS — Z1159 Encounter for screening for other viral diseases: Secondary | ICD-10-CM

## 2015-07-11 DIAGNOSIS — I10 Essential (primary) hypertension: Secondary | ICD-10-CM | POA: Diagnosis not present

## 2015-07-11 DIAGNOSIS — E785 Hyperlipidemia, unspecified: Secondary | ICD-10-CM

## 2015-07-11 DIAGNOSIS — L989 Disorder of the skin and subcutaneous tissue, unspecified: Secondary | ICD-10-CM

## 2015-07-11 DIAGNOSIS — Z0001 Encounter for general adult medical examination with abnormal findings: Secondary | ICD-10-CM

## 2015-07-11 NOTE — Progress Notes (Signed)
Subjective:    Patient ID: Thomas Caldwell, male    DOB: 07/01/1945, 70 y.o.   MRN: [id number]  HPI  Here for wellness and f/u;  Overall doing ok;  Pt denies Chest pain, worsening SOB, DOE, wheezing, orthopnea, PND, worsening LE edema, palpitations, dizziness or syncope.  Pt denies neurological change such as new headache, facial or extremity weakness.  Pt denies polydipsia, polyuria, or low sugar symptoms. Pt states overall good compliance with treatment and medications, good tolerability, and has been trying to follow appropriate diet.  Pt denies worsening depressive symptoms, suicidal ideation or panic. No fever, night sweats, wt loss, loss of appetite, or other constitutional symptoms.  Pt states good ability with ADL's, has low fall risk, home safety reviewed and adequate, no other significant changes in hearing or vision, and only occasionally active with exercise. Wt Readings from Last 3 Encounters:  07/11/15 208 lb (94.348 kg)  04/18/15 207 lb (93.895 kg)  02/15/15 209 lb (94.802 kg)  Asks for derm referral for skin lesion to back Past Medical History  Diagnosis Date  . ANEURYSM 02/02/2007    brain  . BRADYCARDIA, CHRONIC 10/06/2007  . ALLERGIC RHINITIS 01/11/2008  . GERD 07/05/2007  . HYPERLIPIDEMIA 02/02/2007  . HYPERTENSION 02/02/2007  . 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 DIVERTICULOSIS, COLON 07/05/2007  . 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 Lumbago 01/11/2008  . Peptic ulcer    Past Surgical History  Procedure Laterality Date  . Appendectomy  1982  . Sah left brain  1992    due to brain aneurysm s/p repair  . Lumbar disc surgery  12/2005    Dr. Venetia Maxon    reports that he has never smoked. He has never used smokeless tobacco. He reports that he drinks alcohol. He reports that he does not use illicit drugs. family history includes Esophageal cancer in his brother; Heart disease in his father and sister; Hypertension in his brother and  father. Allergies  Allergen Reactions  . Codeine     REACTION: confusion   Current Outpatient Prescriptions on File Prior to Visit  Medication Sig Dispense Refill  . amLODipine (NORVASC) 5 MG tablet TAKE 1 TABLET BY MOUTH DAILY 90 tablet 1  . aspirin 81 MG EC tablet Take 81 mg by mouth daily.      Marland Kitchen atenolol (TENORMIN) 25 MG tablet TAKE 1 TABLET BY MOUTH DAILY 90 tablet 1  . Diclofenac Sodium (PENNSAID) 2 % SOLN Place 2 application onto the skin 2 (two) times daily. 112 g 3  . fluticasone (FLONASE) 50 MCG/ACT nasal spray Place 1 spray into both nostrils daily. 16 g 3  . nabumetone (RELAFEN) 500 MG tablet TAKE 1 TABLET(500 MG) BY MOUTH TWICE DAILY 180 tablet 0  . naproxen (NAPROSYN) 500 MG tablet TAKE 1 TABLET BY MOUTH TWICE DAILY WITH A MEAL 60 tablet 0  . nitroGLYCERIN (NITRO-DUR) 0.1 mg/hr patch 1/4 patch daily 30 patch 12  . omeprazole (PRILOSEC) 20 MG capsule TAKE 2 CAPSULES BY MOUTH EVERY DAY 180 capsule 0  . predniSONE (DELTASONE) 50 MG tablet Take 1 tablet (50 mg total) by mouth daily. 5 tablet 0  . simvastatin (ZOCOR) 40 MG tablet TAKE 1 TABLET BY MOUTH DAILY 90 tablet 3  . triamcinolone cream (KENALOG) 0.5 % APPLY EXTERNALLY TO THE AFFECTED AREA TWICE DAILY 30 g 0   No current facility-administered medications on file prior to visit.   Review of Systems Constitutional: Negative for increased diaphoresis, or other activity, appetite or siginficant  weight change other than noted HENT: Negative for worsening hearing loss, ear pain, facial swelling, mouth sores and neck stiffness.   Eyes: Negative for other worsening pain, redness or visual disturbance.  Respiratory: Negative for choking or stridor Cardiovascular: Negative for other chest pain and palpitations.  Gastrointestinal: Negative for worsening diarrhea, blood in stool, or abdominal distention Genitourinary: Negative for hematuria, flank pain or change in urine volume.  Musculoskeletal: Negative for myalgias or other joint  complaints.  Skin: Negative for other color change and wound or drainage.  Neurological: Negative for syncope and numbness. other than noted Hematological: Negative for adenopathy. or other swelling Psychiatric/Behavioral: Negative for hallucinations, SI, self-injury, decreased concentration or other worsening agitation.      Objective:   Physical Exam BP 130/68 mmHg  Pulse 53  Temp(Src) 98.3 F (36.8 C) (Oral)  Resp 20  Wt 208 lb (94.348 kg)  SpO2 96% VS noted,  Constitutional: Pt is oriented to person, place, and time. Appears well-developed and well-nourished, in no significant distress Head: Normocephalic and atraumatic  Eyes: Conjunctivae and EOM are normal. Pupils are equal, round, and reactive to light Right Ear: External ear normal.  Left Ear: External ear normal Nose: Nose normal.  Mouth/Throat: Oropharynx is clear and moist  Neck: Normal range of motion. Neck supple. No JVD present. No tracheal deviation present or significant neck LA or mass Cardiovascular: Normal rate, regular rhythm, normal heart sounds and intact distal pulses.   Pulmonary/Chest: Effort normal and breath sounds without rales or wheezing  Abdominal: Soft. Bowel sounds are normal. NT. No HSM  Musculoskeletal: Normal range of motion. Exhibits no edema Lymphadenopathy: Has no cervical adenopathy.  Neurological: Pt is alert and oriented to person, place, and time. Pt has normal reflexes. No cranial nerve deficit. Motor grossly intact Skin: Skin is warm and dry. No rash noted or new ulcers, has slight raised lesion to right lower back, < 1/2 cm, tan Psych normal mood and affect. Behavior is normal.   Lab Results  Component Value Date   WBC 6.6 07/09/2015   HGB 15.0 07/09/2015   HCT 44.7 07/09/2015   PLT 220.0 07/09/2015   GLUCOSE 108* 07/09/2015   CHOL 170 07/09/2015   TRIG 91.0 07/09/2015   HDL 41.60 07/09/2015   LDLCALC 110* 07/09/2015   ALT 25 07/09/2015   AST 25 07/09/2015   NA 140  07/09/2015   K 5.3* 07/09/2015   CL 106 07/09/2015   CREATININE 1.19 07/09/2015   BUN 24* 07/09/2015   CO2 27 07/09/2015   TSH 3.57 07/09/2015   PSA 1.04 07/09/2015       Assessment & Plan:

## 2015-07-11 NOTE — Progress Notes (Signed)
Pre visit review using our clinic review tool, if applicable. No additional management support is needed unless otherwise documented below in the visit note. 

## 2015-07-11 NOTE — Patient Instructions (Signed)
Please continue all other medications as before, and refills have been done if requested.  Please have the pharmacy call with any other refills you may need.  Please continue your efforts at being more active, low cholesterol diet, and weight control.  You are otherwise up to date with prevention measures today.  Please keep your appointments with your specialists as you may have planned  You will be contacted regarding the referral for: Dermatology  Please return in 1 year for your yearly visit, or sooner if needed, with Lab testing done 3-5 days before

## 2015-07-14 NOTE — Assessment & Plan Note (Signed)

## 2015-07-14 NOTE — Assessment & Plan Note (Signed)
stable overall by history and exam, recent data reviewed with pt, and pt to continue medical treatment as before,  to f/u any worsening symptoms or concerns BP Readings from Last 3 Encounters:  07/11/15 130/68  04/18/15 126/80  02/15/15 112/82

## 2015-07-14 NOTE — Assessment & Plan Note (Signed)
New enlarging per pt, for derm referral r/o malignancy

## 2015-07-14 NOTE — Assessment & Plan Note (Signed)
Lab Results  Component Value Date   LDLCALC 110* 07/09/2015   stable overall by history and exam, recent data reviewed with pt, and pt to continue medical treatment as before,  to f/u any worsening symptoms or concerns Declines statin, for lower chol diet

## 2015-07-23 ENCOUNTER — Encounter: Payer: Self-pay | Admitting: Internal Medicine

## 2015-08-01 ENCOUNTER — Other Ambulatory Visit: Payer: Self-pay | Admitting: Internal Medicine

## 2015-08-09 ENCOUNTER — Encounter: Payer: Self-pay | Admitting: Medical

## 2015-08-09 ENCOUNTER — Ambulatory Visit (INDEPENDENT_AMBULATORY_CARE_PROVIDER_SITE_OTHER): Payer: 59 | Admitting: Medical

## 2015-08-09 VITALS — BP 118/78 | HR 57 | Temp 98.1°F | Ht 72.0 in | Wt 206.0 lb

## 2015-08-09 DIAGNOSIS — R059 Cough, unspecified: Secondary | ICD-10-CM

## 2015-08-09 DIAGNOSIS — J209 Acute bronchitis, unspecified: Secondary | ICD-10-CM | POA: Diagnosis not present

## 2015-08-09 DIAGNOSIS — R05 Cough: Secondary | ICD-10-CM

## 2015-08-09 DIAGNOSIS — R0981 Nasal congestion: Secondary | ICD-10-CM

## 2015-08-09 MED ORDER — BENZONATATE 100 MG PO CAPS: 100.0000 mg | ORAL_CAPSULE | Freq: Three times a day (TID) | ORAL | Status: DC | PRN

## 2015-08-09 MED ORDER — AZITHROMYCIN 250 MG PO TABS: ORAL_TABLET | ORAL | Status: DC

## 2015-08-09 NOTE — Patient Instructions (Signed)
You appear to have bronchitis with some nasal congestion.  Will rx azithromycin antibiotic. For cough rx benzonatate. For nasal congestion start your flonase.  You should be feeling significant improvement by Tuesday. If not then would consider cxr and cbc.  Follow up in 7 days or prn.

## 2015-08-09 NOTE — Progress Notes (Signed)
Pre visit review using our clinic review tool, if applicable. No additional management support is needed unless otherwise documented below in the visit note. 

## 2015-08-09 NOTE — Progress Notes (Signed)
Subjective:    Patient ID: Thomas Caldwell, male    DOB: 1945-06-06, 70 y.o.   MRN: [id number]  HPI   Pt in for evaluation.  Pt states for 2 wks he has been coughing.Pt states he is bringing up mucous. St and seemed associated with cough. Some sinus pressure. Hx of sinus infection. Pt never smoked.(no wheezing)  Pt states about 2 days ago 1day felt  faint achy then ache went away.  3 weeks ago travel to [place].      Review of Systems  Constitutional: Negative for fever, chills and fatigue.  HENT: Positive for congestion and sinus pressure. Negative for facial swelling, postnasal drip and sore throat.   Respiratory: Negative for cough, shortness of breath and wheezing.   Cardiovascular: Negative for chest pain and palpitations.  Gastrointestinal: Negative for blood in stool and abdominal distention.  Musculoskeletal: Negative for myalgias and back pain.       No myalgias now.  Skin: Negative for rash.  Neurological: Negative for dizziness and headaches.  Hematological: Negative for adenopathy. Does not bruise/bleed easily.  Psychiatric/Behavioral: Negative for behavioral problems and confusion.    Past Medical History  Diagnosis Date  . ANEURYSM 02/02/2007    brain  . BRADYCARDIA, CHRONIC 10/06/2007  . ALLERGIC RHINITIS 01/11/2008  . GERD 07/05/2007  . HYPERLIPIDEMIA 02/02/2007  . HYPERTENSION 02/02/2007  . 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 DIVERTICULOSIS, COLON 07/05/2007  . 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 Lumbago 01/11/2008  . Peptic ulcer      Social History   Social History  . Marital Status: Married    Spouse Name: N/A  . Number of Children: 2  . Years of Education: N/A   Occupational History  . Patent examiner   Social History Main Topics  . Smoking status: Never Smoker   . Smokeless tobacco: Never Used  . Alcohol Use: Yes     Comment: 0-1 per day  . Drug Use: No  . Sexual Activity: Yes    Birth Control/  Protection: None   Other Topics Concern  . Not on file   Social History Narrative    Past Surgical History  Procedure Laterality Date  . Appendectomy  1982  . Sah left brain  1992    due to brain aneurysm s/p repair  . Lumbar disc surgery  12/2005    Dr. Venetia Maxon    Family History  Problem Relation Age of Onset  . Hypertension Father   . Heart disease Father   . Heart disease Sister   . Hypertension Brother     3 bother with HTN  . Esophageal cancer Brother     Allergies  Allergen Reactions  . Codeine     REACTION: confusion    Current Outpatient Prescriptions on File Prior to Visit  Medication Sig Dispense Refill  . amLODipine (NORVASC) 5 MG tablet TAKE 1 TABLET BY MOUTH DAILY 90 tablet 0  . aspirin 81 MG EC tablet Take 81 mg by mouth daily.      Marland Kitchen atenolol (TENORMIN) 25 MG tablet TAKE 1 TABLET BY MOUTH DAILY 90 tablet 0  . Diclofenac Sodium (PENNSAID) 2 % SOLN Place 2 application onto the skin 2 (two) times daily. 112 g 3  . fluticasone (FLONASE) 50 MCG/ACT nasal spray Place 1 spray into both nostrils daily. 16 g 3  . nabumetone (RELAFEN) 500 MG tablet TAKE 1 TABLET(500 MG) BY MOUTH TWICE DAILY 180 tablet 0  . naproxen (NAPROSYN) 500 MG  tablet TAKE 1 TABLET BY MOUTH TWICE DAILY WITH A MEAL 60 tablet 0  . nitroGLYCERIN (NITRO-DUR) 0.1 mg/hr patch 1/4 patch daily 30 patch 12  . omeprazole (PRILOSEC) 20 MG capsule TAKE 2 CAPSULES BY MOUTH EVERY DAY 180 capsule 0  . predniSONE (DELTASONE) 50 MG tablet Take 1 tablet (50 mg total) by mouth daily. 5 tablet 0  . simvastatin (ZOCOR) 40 MG tablet TAKE 1 TABLET BY MOUTH DAILY 90 tablet 3  . triamcinolone cream (KENALOG) 0.5 % APPLY EXTERNALLY TO THE AFFECTED AREA TWICE DAILY 30 g 0   No current facility-administered medications on file prior to visit.    BP 118/78 mmHg  Pulse 57  Temp(Src) 98.1 F (36.7 C) (Oral)  Ht 6' (1.829 m)  Wt 206 lb (93.441 kg)  BMI 27.93 kg/m2  SpO2 98%       Objective:   Physical  Exam  General  Mental Status - Alert. General Appearance - Well groomed. Not in acute distress.  Skin Rashes- No Rashes.  HEENT Head- Normal. Ear Auditory Canal - Left- Normal. Right - Normal.Tympanic Membrane- Left- Normal. Right- Normal. Eye Sclera/Conjunctiva- Left- Normal. Right- Normal. Nose & Sinuses Nasal Mucosa- Left-  Boggy and Congested. Right-  Boggy and  Congested.Bilateral no  maxillary and  no frontal sinus pressure. Mouth & Throat Lips: Upper Lip- Normal: no dryness, cracking, pallor, cyanosis, or vesicular eruption. Lower Lip-Normal: no dryness, cracking, pallor, cyanosis or vesicular eruption. Buccal Mucosa- Bilateral- No Aphthous ulcers. Oropharynx- No Discharge or Erythema. =pnd Tonsils: Characteristics- Bilateral- No Erythema or Congestion. Size/Enlargement- Bilateral- No enlargement. Discharge- bilateral-None.  Neck Neck- Supple. No Masses.   Chest and Lung Exam Auscultation: Breath Sounds:-even and unlabored. Faint rhonchi  Cardiovascular Auscultation:Rythm- Regular, rate and rhythm. Murmurs & Other Heart Sounds:Ausculatation of the heart reveal- No Murmurs.  Lymphatic Head & Neck General Head & Neck Lymphatics: Bilateral: Description- No Localized lymphadenopathy.       Assessment & Plan:  You appear to have bronchitis with some nasal congestion.  Will rx azithromycin antibiotic. For cough rx benzonatate. For nasal congestion start your flonase.  You should be feeling significant improvement by Tuesday. If not then would consider cxr and cbc.  Follow up in 7 days or prn.   Thomas Caldwell, Thomas Dredge, PA-C

## 2015-09-02 ENCOUNTER — Other Ambulatory Visit: Payer: Self-pay | Admitting: Internal Medicine

## 2015-09-07 ENCOUNTER — Other Ambulatory Visit: Payer: Self-pay | Admitting: Internal Medicine

## 2015-09-10 ENCOUNTER — Encounter: Payer: Self-pay | Admitting: Internal Medicine

## 2015-09-10 ENCOUNTER — Ambulatory Visit (INDEPENDENT_AMBULATORY_CARE_PROVIDER_SITE_OTHER): Payer: 59 | Admitting: Internal Medicine

## 2015-09-10 VITALS — BP 118/68 | HR 68 | Temp 98.0°F | Resp 20 | Wt 208.0 lb

## 2015-09-10 DIAGNOSIS — I10 Essential (primary) hypertension: Secondary | ICD-10-CM

## 2015-09-10 DIAGNOSIS — R001 Bradycardia, unspecified: Secondary | ICD-10-CM

## 2015-09-10 DIAGNOSIS — J019 Acute sinusitis, unspecified: Secondary | ICD-10-CM | POA: Diagnosis not present

## 2015-09-10 MED ORDER — LEVOFLOXACIN 500 MG PO TABS: 500.0000 mg | ORAL_TABLET | Freq: Every day | ORAL | 0 refills | Status: AC

## 2015-09-10 MED ORDER — BENZONATATE 100 MG PO CAPS: ORAL_CAPSULE | ORAL | 1 refills | Status: DC

## 2015-09-10 NOTE — Assessment & Plan Note (Signed)
BP 118/68   Pulse 68   Temp 98 F (36.7 C) (Oral)   Resp 20   Wt 208 lb (94.3 kg)   SpO2 94%   BMI 28.21 kg/m  Improved today, no specific tx needed,  to f/u any worsening symptoms or concerns

## 2015-09-10 NOTE — Assessment & Plan Note (Signed)
stable overall by history and exam, recent data reviewed with pt, and pt to continue medical treatment as before,  to f/u any worsening symptoms or concerns BP Readings from Last 3 Encounters:  09/10/15 118/68  08/09/15 118/78  07/11/15 130/68

## 2015-09-10 NOTE — Patient Instructions (Signed)
Please take all new medication as prescribed - the antibiotic, and cough medicine  Please continue all other medications as before, and refills have been done if requested.  Please have the pharmacy call with any other refills you may need.  Please keep your appointments with your specialists as you may have planned      

## 2015-09-10 NOTE — Progress Notes (Signed)
Pre visit review using our clinic review tool, if applicable. No additional management support is needed unless otherwise documented below in the visit note. 

## 2015-09-10 NOTE — Assessment & Plan Note (Signed)
Mild to mod, for antibx course,  to f/u any worsening symptoms or concerns 

## 2015-09-10 NOTE — Progress Notes (Signed)
Subjective:    Patient ID: Thomas Caldwell, male    DOB: 1945-02-16, 70 y.o.   MRN: [id number]  HPI   Here with 2-3 days acute onset fever, facial pain, pressure, headache, general weakness and malaise, and greenish d/c, with mild ST and cough, but pt denies chest pain, wheezing, increased sob or doe, orthopnea, PND, increased LE swelling, palpitations, dizziness or syncope. Pt denies new neurological symptoms such as new headache, or facial or extremity weakness or numbness   Pt denies polydipsia, polyuria,  Past Medical History:  Diagnosis Date  . ALLERGIC RHINITIS 01/11/2008  . ANEURYSM 02/02/2007   brain  . BRADYCARDIA, CHRONIC 10/06/2007  . DIVERTICULOSIS, COLON 07/05/2007  . 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 GERD 07/05/2007  . HYPERLIPIDEMIA 02/02/2007  . HYPERTENSION 02/02/2007  . Lumbago 01/11/2008  . Peptic ulcer   . PERSISTENT DISORDER INITIATING/MAINTAINING SLEEP 02/02/2007   Past Surgical History:  Procedure Laterality Date  . APPENDECTOMY  1982  . LUMBAR DISC SURGERY  12/2005   Dr. Venetia Maxon  . SAH Left brain  1992   due to brain aneurysm s/p repair    reports that he has never smoked. He has never used smokeless tobacco. He reports that he drinks alcohol. He reports that he does not use drugs. family history includes Esophageal cancer in his brother; Heart disease in his father and sister; Hypertension in his brother and father. Allergies  Allergen Reactions  . Codeine     REACTION: confusion   Current Outpatient Prescriptions on File Prior to Visit  Medication Sig Dispense Refill  . amLODipine (NORVASC) 5 MG tablet TAKE 1 TABLET BY MOUTH DAILY 90 tablet 0  . aspirin 81 MG EC tablet Take 81 mg by mouth daily.      Marland Kitchen atenolol (TENORMIN) 25 MG tablet TAKE 1 TABLET BY MOUTH DAILY 90 tablet 0  . Diclofenac Sodium (PENNSAID) 2 % SOLN Place 2 application onto the skin 2 (two) times daily. 112 g 3  . fluticasone (FLONASE) 50 MCG/ACT nasal spray Place 1 spray into both  nostrils daily. 16 g 3  . nabumetone (RELAFEN) 500 MG tablet TAKE 1 TABLET(500 MG) BY MOUTH TWICE DAILY 180 tablet 0  . naproxen (NAPROSYN) 500 MG tablet TAKE 1 TABLET BY MOUTH TWICE DAILY WITH A MEAL 60 tablet 0  . nitroGLYCERIN (NITRO-DUR) 0.1 mg/hr patch 1/4 patch daily 30 patch 12  . omeprazole (PRILOSEC) 20 MG capsule TAKE 2 CAPSULES BY MOUTH EVERY DAY 180 capsule 1  . predniSONE (DELTASONE) 50 MG tablet Take 1 tablet (50 mg total) by mouth daily. 5 tablet 0  . simvastatin (ZOCOR) 40 MG tablet TAKE 1 TABLET BY MOUTH DAILY 90 tablet 1  . triamcinolone cream (KENALOG) 0.5 % APPLY EXTERNALLY TO THE AFFECTED AREA TWICE DAILY 30 g 0   No current facility-administered medications on file prior to visit.    Review of Systems  Constitutional: Negative for unusual diaphoresis or night sweats HENT: Negative for ear swelling or discharge Eyes: Negative for worsening visual haziness  Respiratory: Negative for choking and stridor.   Gastrointestinal: Negative for distension or worsening eructation Genitourinary: Negative for retention or change in urine volume.  Musculoskeletal: Negative for other MSK pain or swelling Skin: Negative for color change and worsening wound Neurological: Negative for tremors and numbness other than noted  Psychiatric/Behavioral: Negative for decreased concentration or agitation other than above       Objective:   Physical Exam BP 118/68   Pulse 68  Temp 98 F (36.7 C) (Oral)   Resp 20   Wt 208 lb (94.3 kg)   SpO2 94%   BMI 28.21 kg/m  VS noted, mild ill Constitutional: Pt appears in no apparent distress HENT: Head: NCAT.  Right Ear: External ear normal.  Left Ear: External ear normal.  Bilat tm's with mild erythema.  Max sinus areas mild tender.  Pharynx with mild erythema, no exudate Eyes: . Pupils are equal, round, and reactive to light. Conjunctivae and EOM are normal Neck: Normal range of motion. Neck supple.  Cardiovascular: Normal rate and  regular rhythm.   Pulmonary/Chest: Effort normal and breath sounds without rales or wheezing.  Neurological: Pt is alert. Not confused , motor grossly intact Skin: Skin is warm. No rash, no LE edema Psychiatric: Pt behavior is normal. No agitation.     Assessment & Plan:

## 2015-10-02 ENCOUNTER — Other Ambulatory Visit: Payer: Self-pay | Admitting: Internal Medicine

## 2015-10-30 ENCOUNTER — Other Ambulatory Visit: Payer: Self-pay | Admitting: Internal Medicine

## 2015-10-30 ENCOUNTER — Other Ambulatory Visit: Payer: Self-pay | Admitting: Family

## 2015-11-01 ENCOUNTER — Other Ambulatory Visit: Payer: Self-pay | Admitting: Internal Medicine

## 2015-11-04 ENCOUNTER — Telehealth: Payer: Self-pay | Admitting: *Deleted

## 2015-11-04 NOTE — Telephone Encounter (Signed)
Rec'd fax stating pt med Atenolol 100 mg is currently unavailable from manufacturer. Need Md to change to another alternative...Thomas Caldwell

## 2015-11-05 MED ORDER — METOPROLOL SUCCINATE ER 25 MG PO TB24: 25.0000 mg | ORAL_TABLET | Freq: Every day | ORAL | 3 refills | Status: DC

## 2015-11-05 NOTE — Telephone Encounter (Signed)
Ok to try change toprol XL 25 mg

## 2015-11-06 NOTE — Telephone Encounter (Signed)
Called pt no answer LMOM MD sent alternative to [facility]...Raechel Chute

## 2015-11-26 ENCOUNTER — Telehealth: Payer: Self-pay | Admitting: Internal Medicine

## 2015-11-26 NOTE — Telephone Encounter (Signed)
Can he be more specific, such as any side effects, or particular BP's that he thinks are not doing well, thanks

## 2015-11-26 NOTE — Telephone Encounter (Signed)
Please call patient. He does not believe that the change in his bp medication is working.

## 2015-11-27 MED ORDER — ROSUVASTATIN CALCIUM 20 MG PO TABS: 20.0000 mg | ORAL_TABLET | Freq: Every day | ORAL | 3 refills | Status: DC

## 2015-11-27 MED ORDER — AMLODIPINE BESYLATE 10 MG PO TABS: 10.0000 mg | ORAL_TABLET | Freq: Every day | ORAL | 3 refills | Status: DC

## 2015-11-27 NOTE — Telephone Encounter (Signed)
Although HA may or may not be related as the BP is only mildly elevated, we can increase the amlodipine to 10 qd  Also, in doing this, we have to change the simvastatin to generic crestor as simvastatin is not recommended at the higher dose amlodipine  Both Done erx

## 2015-11-27 NOTE — Telephone Encounter (Signed)
Patient states that his blood pressure has been running high lately. 140/90. Patient is stating that he is also experiencing headaches.

## 2015-11-28 ENCOUNTER — Other Ambulatory Visit: Payer: Self-pay | Admitting: Internal Medicine

## 2015-12-05 ENCOUNTER — Other Ambulatory Visit: Payer: Self-pay | Admitting: Internal Medicine

## 2015-12-18 ENCOUNTER — Encounter: Payer: Self-pay | Admitting: Internal Medicine

## 2015-12-18 ENCOUNTER — Ambulatory Visit (INDEPENDENT_AMBULATORY_CARE_PROVIDER_SITE_OTHER): Payer: 59 | Admitting: Internal Medicine

## 2015-12-18 VITALS — BP 132/72 | HR 74 | Temp 98.8°F | Resp 20 | Wt 205.0 lb

## 2015-12-18 DIAGNOSIS — H6693 Otitis media, unspecified, bilateral: Secondary | ICD-10-CM

## 2015-12-18 DIAGNOSIS — I10 Essential (primary) hypertension: Secondary | ICD-10-CM | POA: Diagnosis not present

## 2015-12-18 DIAGNOSIS — J309 Allergic rhinitis, unspecified: Secondary | ICD-10-CM | POA: Diagnosis not present

## 2015-12-18 MED ORDER — LEVOFLOXACIN 500 MG PO TABS: 500.0000 mg | ORAL_TABLET | Freq: Every day | ORAL | 0 refills | Status: AC

## 2015-12-18 NOTE — Progress Notes (Signed)
Pre visit review using our clinic review tool, if applicable. No additional management support is needed unless otherwise documented below in the visit note. 

## 2015-12-18 NOTE — Progress Notes (Signed)
   Subjective:    Patient ID: Thomas Caldwell, male    DOB: February 27, 1945, 70 y.o.   MRN: [id number]  HPI here with c/o 3-4 days onset bilat ear pain with hyperacusis first noticed when went [place] just after returning from work [place] trip - severe pain and ringing afterwards, but also then several days pain, fullness, muffled hearing, tinnitus and dizziness noted.  Also with low grade temp, malaise and weakness, feeling overall poorly, sinus congestion and drainage, mild ST and non prod cough.   Pt denies chest pain, increased sob or doe, wheezing, orthopnea, PND, increased LE swelling, palpitations, dizziness or syncope.   Pt denies polydipsia, polyuria  Also, Does have several wks ongoing nasal allergy symptoms with clearish congestion, itch and sneezing, without fever, pain, ST, cough, swelling or wheezing, just started using the flonase again yesterday Past Medical History:  Diagnosis Date  . ALLERGIC RHINITIS 01/11/2008  . ANEURYSM 02/02/2007   brain  . BRADYCARDIA, CHRONIC 10/06/2007  . DIVERTICULOSIS, COLON 07/05/2007  . 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 GERD 07/05/2007  . HYPERLIPIDEMIA 02/02/2007  . HYPERTENSION 02/02/2007  . Lumbago 01/11/2008  . Peptic ulcer   . PERSISTENT DISORDER INITIATING/MAINTAINING SLEEP 02/02/2007   Past Surgical History:  Procedure Laterality Date  . APPENDECTOMY  1982  . LUMBAR DISC SURGERY  12/2005   Dr. Venetia Maxon  . SAH Left brain  1992   due to brain aneurysm s/p repair    reports that he has never smoked. He has never used smokeless tobacco. He reports that he drinks alcohol. He reports that he does not use drugs. family history includes Esophageal cancer in his brother; Heart disease in his father and sister; Hypertension in his brother and father. Allergies  Allergen Reactions  . Codeine     REACTION: confusion   Current Outpatient Prescriptions on File Prior to Visit  Medication Sig Dispense Refill  . amLODipine (NORVASC) 10 MG tablet Take  1 tablet (10 mg total) by mouth daily. 90 tablet 3  . aspirin 81 MG EC tablet Take 81 mg by mouth daily.      . Diclofenac Sodium (PENNSAID) 2 % SOLN Place 2 application onto the skin 2 (two) times daily. 112 g 3  . fluticasone (FLONASE) 50 MCG/ACT nasal spray Place 1 spray into both nostrils daily. 16 g 3  . metoprolol succinate (TOPROL-XL) 25 MG 24 hr tablet Take 1 tablet (25 mg total) by mouth daily. 90 tablet 3  . nabumetone (RELAFEN) 500 MG tablet TAKE 1 TABLET(500 MG) BY MOUTH TWICE DAILY 180 tablet 0  . naproxen (NAPROSYN) 500 MG tablet TAKE 1 TABLET BY MOUTH TWICE DAILY WITH A MEAL 60 tablet 0  . nitroGLYCERIN (NITRO-DUR) 0.1 mg/hr patch 1/4 patch daily 30 patch 12  . omeprazole (PRILOSEC) 20 MG capsule TAKE 2 CAPSULES BY MOUTH EVERY DAY 180 capsule 1  . predniSONE (DELTASONE) 50 MG tablet Take 1 tablet (50 mg total) by mouth daily. 5 tablet 0  . rosuvastatin (CRESTOR) 20 MG tablet Take 1 tablet (20 mg total) by mouth daily. 90 tablet 3  . triamcinolone cream (KENALOG) 0.5 % APPLY EXTERNALLY TO THE AFFECTED AREA TWICE DAILY 30 g 0   No current facility-administered medications on file prior to visit.    Review of Systems     Objective:   Physical Exam        Assessment & Plan:

## 2015-12-18 NOTE — Patient Instructions (Signed)
Please take all new medication as prescribed- - the antibiotic  You can also take Delsym OTC for cough, and/or Mucinex D (or it's generic off brand) for congestion, and tylenol as needed for pain.  Please also continue the flonase, and you can add Zyrtec or Allegra as well  Please continue all other medications as before, and refills have been done if requested.  Please have the pharmacy call with any other refills you may need.  Please keep your appointments with your specialists as you may have planned

## 2015-12-19 NOTE — Assessment & Plan Note (Signed)
Mild to mod seasonal flare, for cont'd flonase restart, and zyrtec otc prn,  to f/u any worsening symptoms or concerns

## 2015-12-19 NOTE — Assessment & Plan Note (Signed)
stable overall by history and exam, recent data reviewed with pt, and pt to continue medical treatment as before,  to f/u any worsening symptoms or concerns BP Readings from Last 3 Encounters:  12/18/15 132/72  09/10/15 118/68  08/09/15 118/78

## 2015-12-19 NOTE — Assessment & Plan Note (Signed)
Mild to mod, for antibx course,  to f/u any worsening symptoms or concerns 

## 2015-12-25 ENCOUNTER — Other Ambulatory Visit: Payer: Self-pay | Admitting: Internal Medicine

## 2016-01-23 ENCOUNTER — Other Ambulatory Visit: Payer: Self-pay | Admitting: Internal Medicine

## 2016-01-23 NOTE — Telephone Encounter (Signed)
Done erx 

## 2016-01-23 NOTE — Telephone Encounter (Signed)
Please advise, thanks.

## 2016-01-27 ENCOUNTER — Other Ambulatory Visit: Payer: Self-pay | Admitting: Internal Medicine

## 2016-02-04 ENCOUNTER — Encounter: Payer: Self-pay | Admitting: Internal Medicine

## 2016-02-04 MED ORDER — AZITHROMYCIN 250 MG PO TABS: ORAL_TABLET | ORAL | 0 refills | Status: DC

## 2016-02-23 ENCOUNTER — Other Ambulatory Visit: Payer: Self-pay | Admitting: Internal Medicine

## 2016-03-02 ENCOUNTER — Other Ambulatory Visit: Payer: Self-pay | Admitting: Internal Medicine

## 2016-04-13 ENCOUNTER — Other Ambulatory Visit: Payer: Self-pay | Admitting: Family Medicine

## 2016-04-22 ENCOUNTER — Other Ambulatory Visit: Payer: Self-pay | Admitting: Internal Medicine

## 2016-04-25 ENCOUNTER — Other Ambulatory Visit: Payer: Self-pay | Admitting: Internal Medicine

## 2016-05-21 ENCOUNTER — Other Ambulatory Visit: Payer: Self-pay | Admitting: Internal Medicine

## 2016-05-25 ENCOUNTER — Other Ambulatory Visit: Payer: Self-pay

## 2016-05-25 ENCOUNTER — Other Ambulatory Visit: Payer: Self-pay | Admitting: Family Medicine

## 2016-05-25 MED ORDER — TRIAMCINOLONE ACETONIDE 0.5 % EX CREA: TOPICAL_CREAM | CUTANEOUS | 0 refills | Status: DC

## 2016-06-02 ENCOUNTER — Encounter: Payer: Self-pay | Admitting: Family Medicine

## 2016-06-02 ENCOUNTER — Ambulatory Visit (INDEPENDENT_AMBULATORY_CARE_PROVIDER_SITE_OTHER): Payer: 59 | Admitting: Family Medicine

## 2016-06-02 ENCOUNTER — Other Ambulatory Visit: Payer: Self-pay | Admitting: Family Medicine

## 2016-06-02 DIAGNOSIS — M7661 Achilles tendinitis, right leg: Secondary | ICD-10-CM

## 2016-06-02 MED ORDER — NITROGLYCERIN 0.1 MG/HR TD PT24: MEDICATED_PATCH | TRANSDERMAL | 12 refills | Status: DC

## 2016-06-02 NOTE — Assessment & Plan Note (Signed)
Doing better at this moment with conservative therapy. We discussed icing regimen and home exercises. We discussed which activities doing which ones to avoid. Patient continued to be active otherwise. Follow-up as needed.

## 2016-06-02 NOTE — Progress Notes (Signed)
  [name] [address] [place], [place] [address] Phone: [phone] Subjective:     CC: Right ankle pain f/u  CBS:[id number]  Thomas Caldwell is a 71 y.o. male coming in with complaint of right ankle pain.  Patient has been previously seen and did have more of an Achilles tendinitis. We discussed home exercises and icing. Patient says his long as he does taping of the ankle seems to do relatively well. Has stopped the nitroglycerin. Stated that it is very intermittent and when it is giving him trouble.  Past Medical History:  Diagnosis Date  . ALLERGIC RHINITIS 01/11/2008  . ANEURYSM 02/02/2007   brain  . BRADYCARDIA, CHRONIC 10/06/2007  . DIVERTICULOSIS, COLON 07/05/2007  . 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 GERD 07/05/2007  . HYPERLIPIDEMIA 02/02/2007  . HYPERTENSION 02/02/2007  . Lumbago 01/11/2008  . Peptic ulcer   . PERSISTENT DISORDER INITIATING/MAINTAINING SLEEP 02/02/2007   Past Surgical History:  Procedure Laterality Date  . APPENDECTOMY  1982  . LUMBAR DISC SURGERY  12/2005   Dr. Venetia Maxon  . SAH Left brain  1992   due to brain aneurysm s/p repair   ,soc Allergies  Allergen Reactions  . Codeine     REACTION: confusion   Mr. Hoyos had no medications administered during this visit. Family History  Problem Relation Age of Onset  . Hypertension Father   . Heart disease Father   . Heart disease Sister   . Hypertension Brother     3 bother with HTN  . Esophageal cancer Brother        Past medical history, social, surgical and family history all reviewed in electronic medical record.   Review of Systems: No headache, visual changes, nausea, vomiting, diarrhea, constipation, dizziness, abdominal pain, skin rash, fevers, chills, night sweats, weight loss, swollen lymph nodes, body aches, joint swelling, muscle aches, chest pain, shortness of breath, mood changes.     Objective  Blood pressure (!) 162/98, pulse 68, resp. rate 16,  weight 210 lb (95.3 kg), SpO2 98 %.  Systems examined below as of 06/02/16 General: NAD A&O x3 mood, affect normal  HEENT: Pupils equal, extraocular movements intact no nystagmus Respiratory: not short of breath at rest or with speaking Cardiovascular: No lower extremity edema, non tender Skin: Warm dry intact with no signs of infection or rash on extremities or on axial skeleton. Abdomen: Soft nontender, no masses Neuro: Cranial nerves  intact, neurovascularly intact in all extremities with 2+ DTRs and 2+ pulses. Lymph: No lymphadenopathy appreciated today  Gait normal with good balance and coordination.  MSK: Non tender with full range of motion and good stability and symmetric strength and tone of shoulders, elbows, wrist,  knee hips bilaterally.    Ankle: Right Patient does have a nodule Achilles tendon. Less tender than previously less information.. Strength is 5/5 in all directions. Stable lateral and medial ligaments; squeeze test and kleiger test unremarkable; Talar dome nontender; No pain at base of 5th MT; No tenderness over cuboid; No tenderness over N spot or navicular prominence No tenderness on posterior aspects of lateral and medial malleolus No sign of peroneal tendon subluxations or tenderness to palpation Negative tarsal tunnel tinel's Able to walk 4 steps. Contralateral ankle unremarkable.    Impression and Recommendations:     This case required medical decision making of moderate complexity.

## 2016-06-02 NOTE — Patient Instructions (Addendum)
Good to see you  Thomas Caldwell is your friend.  Keep taping it with a lot of activity  Nitro can start again if needed Good luck with your computer See me when you need me

## 2016-06-09 ENCOUNTER — Ambulatory Visit (INDEPENDENT_AMBULATORY_CARE_PROVIDER_SITE_OTHER): Payer: 59 | Admitting: Internal Medicine

## 2016-06-09 ENCOUNTER — Encounter: Payer: Self-pay | Admitting: Internal Medicine

## 2016-06-09 VITALS — BP 146/80 | HR 68 | Ht 72.0 in | Wt 209.0 lb

## 2016-06-09 DIAGNOSIS — M545 Low back pain: Secondary | ICD-10-CM | POA: Diagnosis not present

## 2016-06-09 DIAGNOSIS — H6091 Unspecified otitis externa, right ear: Secondary | ICD-10-CM

## 2016-06-09 DIAGNOSIS — I10 Essential (primary) hypertension: Secondary | ICD-10-CM

## 2016-06-09 MED ORDER — LEVOFLOXACIN 250 MG PO TABS: 250.0000 mg | ORAL_TABLET | Freq: Every day | ORAL | 0 refills | Status: AC

## 2016-06-09 MED ORDER — VALSARTAN 160 MG PO TABS: 160.0000 mg | ORAL_TABLET | Freq: Every day | ORAL | 3 refills | Status: DC

## 2016-06-09 MED ORDER — TIZANIDINE HCL 4 MG PO TABS: 4.0000 mg | ORAL_TABLET | Freq: Four times a day (QID) | ORAL | 1 refills | Status: DC | PRN

## 2016-06-09 MED ORDER — TRAMADOL HCL 50 MG PO TABS: 50.0000 mg | ORAL_TABLET | Freq: Three times a day (TID) | ORAL | 1 refills | Status: DC | PRN

## 2016-06-09 NOTE — Assessment & Plan Note (Signed)
Mild persistent elevated, to add diovan 160 qd, f/u BP at home and next visit

## 2016-06-09 NOTE — Patient Instructions (Signed)
Please take all new medication as prescribed - the antibiotic  Please take all new medication as prescribed  - the diovan 160 mg per day for blood pressure (and cont all other medications)  Please take all new medication as prescribed - the tramadol for pain, and tizanidine as needed for muscle relaxer  Please continue all other medications as before, and refills have been done if requested.  Please have the pharmacy call with any other refills you may need.  Please keep your appointments with your specialists as you may have planned

## 2016-06-09 NOTE — Progress Notes (Signed)
Subjective:    Patient ID: Thomas Caldwell, male    DOB: [birth year], 71 y.o.   MRN: [id number]  HPI  Here with 3 days onset right ear pain, somewhat tender to touch, mild to mod but persistent, kept him awake last PM, has felt warm, fatigued without ear swelling or d/c.  Pain radiates to the right occipital area and right upper neck.  Had to cancel trip to [place] today due to symptoms  Also BP has been elevated recently on current meds, asks for further tx. Pt denies chest pain, increased sob or doe, wheezing, orthopnea, PND, increased LE swelling, palpitations, dizziness or syncope.  Pt denies new neurological symptoms such as new headache, or facial or extremity weakness or numbness   Pt denies polydipsia, polyuria.   Has hx of bradycardia with higher dose BB per pt with HR at night 48 BP Readings from Last 3 Encounters:  06/09/16 (!) 146/80  06/02/16 (!) 162/98  12/18/15 132/72  Also c/o incidental worsening LBP, located across the lower back but Pt denies bowel or bladder change, fever, wt loss,  worsening LE pain/numbness/weakness, gait change or falls. Has hx of lumbar radiculopathy and surgury, but this pain is different.  Dull, achy, constant mod but persistent, worse to bend.  Denies urinary symptoms such as dysuria, frequency, urgency, flank pain, hematuria or n/v, fever, chills.  Denies worsening reflux, abd pain, dysphagia, n/v, bowel change or blood. Past Medical History:  Diagnosis Date  . ALLERGIC RHINITIS 01/11/2008  . ANEURYSM 02/02/2007   brain  . BRADYCARDIA, CHRONIC 10/06/2007  . DIVERTICULOSIS, COLON 07/05/2007  . 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 GERD 07/05/2007  . HYPERLIPIDEMIA 02/02/2007  . HYPERTENSION 02/02/2007  . Lumbago 01/11/2008  . Peptic ulcer   . PERSISTENT DISORDER INITIATING/MAINTAINING SLEEP 02/02/2007   Past Surgical History:  Procedure Laterality Date  . APPENDECTOMY  1982  . LUMBAR DISC SURGERY  12/2005   Dr. Venetia Maxon  . SAH Left brain  1992   due to  brain aneurysm s/p repair    reports that he has never smoked. He has never used smokeless tobacco. He reports that he drinks alcohol. He reports that he does not use drugs. family history includes Esophageal cancer in his brother; Heart disease in his father and sister; Hypertension in his brother and father. Allergies  Allergen Reactions  . Codeine     REACTION: confusion   Current Outpatient Prescriptions on File Prior to Visit  Medication Sig Dispense Refill  . amLODipine (NORVASC) 10 MG tablet Take 1 tablet (10 mg total) by mouth daily. 90 tablet 3  . aspirin 81 MG EC tablet Take 81 mg by mouth daily.      . Diclofenac Sodium (PENNSAID) 2 % SOLN Place 2 application onto the skin 2 (two) times daily. 112 g 3  . fluticasone (FLONASE) 50 MCG/ACT nasal spray Place 1 spray into both nostrils daily. 16 g 3  . metoprolol succinate (TOPROL-XL) 25 MG 24 hr tablet Take 1 tablet (25 mg total) by mouth daily. 90 tablet 3  . nabumetone (RELAFEN) 500 MG tablet TAKE 1 TABLET(500 MG) BY MOUTH TWICE DAILY 180 tablet 0  . naproxen (NAPROSYN) 500 MG tablet TAKE 1 TABLET BY MOUTH TWICE DAILY WITH A MEAL 60 tablet 1  . nitroGLYCERIN (NITRO-DUR) 0.1 mg/hr patch 1/4 patch daily 30 patch 12  . omeprazole (PRILOSEC) 20 MG capsule TAKE 2 CAPSULES BY MOUTH EVERY DAY 180 capsule 1  . rosuvastatin (CRESTOR) 20 MG tablet Take  1 tablet (20 mg total) by mouth daily. 90 tablet 3  . triamcinolone cream (KENALOG) 0.5 % APPLY EXTERNALLY TO THE AFFECTED AREA TWICE DAILY 30 g 0   No current facility-administered medications on file prior to visit.    Review of Systems  Constitutional: Negative for other unusual diaphoresis or sweats HENT: Negative for ear discharge or swelling Eyes: Negative for other worsening visual disturbances Respiratory: Negative for stridor or other swelling  Gastrointestinal: Negative for worsening distension or other blood Genitourinary: Negative for retention or other urinary  change Musculoskeletal: Negative for other MSK pain or swelling Skin: Negative for color change or other new lesions Neurological: Negative for worsening tremors and other numbness  Psychiatric/Behavioral: Negative for worsening agitation or other fatigue All other system neg per pt    Objective:   Physical Exam BP (!) 146/80   Pulse 68   Ht 6' (1.829 m)   Wt 209 lb (94.8 kg)   SpO2 99%   BMI 28.35 kg/m  VS noted, mild ill Constitutional: Pt appears in NAD HENT: Head: NCAT.  Right Ear: External ear normal. Right canal with trace to 1+ milder erythema and swelling without d/c Left Ear: External ear normal.  Eyes: . Pupils are equal, round, and reactive to light. Conjunctivae and EOM are normal Nose: without d/c or deformity Neck: Neck supple. Gross normal ROM Cardiovascular: Normal rate and regular rhythm.   Pulmonary/Chest: Effort normal and breath sounds without rales or wheezing.  Abd:  Soft, NT, ND, + BS, no organomegaly Spine nontender and no significant bilat lumbar paravertebral tender as well Neurological: Pt is alert. At baseline orientation, motor 5/5 intact Skin: Skin is warm. No rashes, other new lesions, no LE edema Psychiatric: Pt behavior is normal without agitation  No other exam findings    Assessment & Plan:

## 2016-06-09 NOTE — Assessment & Plan Note (Signed)
Moderate symptoms but no neuro changes, suspect MSK lumbar related, for tramadol prn, tizanidine prn

## 2016-06-09 NOTE — Assessment & Plan Note (Signed)
Mild to mod, for antibx course,  to f/u any worsening symptoms or concerns 

## 2016-06-24 ENCOUNTER — Encounter: Payer: Self-pay | Admitting: Internal Medicine

## 2016-06-25 ENCOUNTER — Other Ambulatory Visit: Payer: Self-pay | Admitting: Internal Medicine

## 2016-06-30 ENCOUNTER — Other Ambulatory Visit (INDEPENDENT_AMBULATORY_CARE_PROVIDER_SITE_OTHER): Payer: 59

## 2016-06-30 DIAGNOSIS — Z1159 Encounter for screening for other viral diseases: Secondary | ICD-10-CM

## 2016-06-30 DIAGNOSIS — Z0001 Encounter for general adult medical examination with abnormal findings: Secondary | ICD-10-CM

## 2016-06-30 LAB — BASIC METABOLIC PANEL
BUN: 27 mg/dL — ABNORMAL HIGH (ref 6–23)
CALCIUM: 9.5 mg/dL (ref 8.4–10.5)
CHLORIDE: 106 meq/L (ref 96–112)
CO2: 27 mEq/L (ref 19–32)
CREATININE: 1.19 mg/dL (ref 0.40–1.50)
GFR: 64.18 mL/min (ref >60.00)
GLUCOSE: 108 mg/dL — AB (ref 70–99)
Potassium: 5 mEq/L (ref 3.5–5.1)
SODIUM: 140 meq/L (ref 135–145)

## 2016-06-30 LAB — URINALYSIS, ROUTINE W REFLEX MICROSCOPIC
BILIRUBIN URINE: NEGATIVE
HGB URINE DIPSTICK: NEGATIVE
KETONES UR: NEGATIVE
LEUKOCYTES UA: NEGATIVE
Nitrite: NEGATIVE
PH: 6 (ref 5.0–8.0)
Specific Gravity, Urine: 1.015 (ref 1.000–1.030)
TOTAL PROTEIN, URINE-UPE24: NEGATIVE
UROBILINOGEN UA: 0.2 (ref 0.0–1.0)
Urine Glucose: NEGATIVE

## 2016-06-30 LAB — CBC WITH DIFFERENTIAL/PLATELET
BASOS ABS: 0.1 10*3/uL (ref 0.0–0.1)
Basophils Relative: 1 % (ref 0.0–3.0)
EOS ABS: 0.3 10*3/uL (ref 0.0–0.7)
Eosinophils Relative: 5 % (ref 0.0–5.0)
HCT: 44.9 % (ref 39.0–52.0)
Hemoglobin: 15 g/dL (ref 13.0–17.0)
LYMPHS ABS: 1.9 10*3/uL (ref 0.7–4.0)
Lymphocytes Relative: 33.9 % (ref 12.0–46.0)
MCHC: 33.3 g/dL (ref 30.0–36.0)
MCV: 88.1 fl (ref 78.0–100.0)
MONOS PCT: 13.3 % — AB (ref 3.0–12.0)
Monocytes Absolute: 0.8 10*3/uL (ref 0.1–1.0)
NEUTROS PCT: 46.8 % (ref 43.0–77.0)
Neutro Abs: 2.7 10*3/uL (ref 1.4–7.7)
Platelets: 212 10*3/uL (ref 150.0–400.0)
RBC: 5.1 Mil/uL (ref 4.22–5.81)
RDW: 14.1 % (ref 11.5–15.5)
WBC: 5.7 10*3/uL (ref 4.0–10.5)

## 2016-06-30 LAB — HEPATIC FUNCTION PANEL
ALBUMIN: 4.1 g/dL (ref 3.5–5.2)
ALK PHOS: 87 U/L (ref 39–117)
ALT: 21 U/L (ref 0–53)
AST: 23 U/L (ref 0–37)
Bilirubin, Direct: 0.1 mg/dL (ref 0.0–0.3)
TOTAL PROTEIN: 6.7 g/dL (ref 6.0–8.3)
Total Bilirubin: 0.5 mg/dL (ref 0.2–1.2)

## 2016-06-30 LAB — LIPID PANEL
Cholesterol: 223 mg/dL — ABNORMAL HIGH (ref 0–200)
HDL: 45.4 mg/dL (ref >39.00)
LDL Cholesterol: 150 mg/dL — ABNORMAL HIGH (ref 0–99)
NONHDL: 177.62
Total CHOL/HDL Ratio: 5
Triglycerides: 139 mg/dL (ref 0.0–149.0)
VLDL: 27.8 mg/dL (ref 0.0–40.0)

## 2016-06-30 LAB — HEPATITIS C ANTIBODY: HCV AB: NEGATIVE

## 2016-06-30 LAB — TSH: TSH: 4.93 u[IU]/mL — AB (ref 0.35–4.50)

## 2016-06-30 LAB — PSA: PSA: 1.52 ng/mL (ref 0.10–4.00)

## 2016-07-08 ENCOUNTER — Other Ambulatory Visit: Payer: Self-pay | Admitting: Internal Medicine

## 2016-07-16 ENCOUNTER — Ambulatory Visit (INDEPENDENT_AMBULATORY_CARE_PROVIDER_SITE_OTHER): Payer: 59 | Admitting: Internal Medicine

## 2016-07-16 ENCOUNTER — Encounter: Payer: Self-pay | Admitting: Internal Medicine

## 2016-07-16 VITALS — BP 144/90 | HR 62 | Ht 72.0 in | Wt 207.0 lb

## 2016-07-16 DIAGNOSIS — I10 Essential (primary) hypertension: Secondary | ICD-10-CM

## 2016-07-16 DIAGNOSIS — R946 Abnormal results of thyroid function studies: Secondary | ICD-10-CM | POA: Diagnosis not present

## 2016-07-16 DIAGNOSIS — Z0001 Encounter for general adult medical examination with abnormal findings: Secondary | ICD-10-CM | POA: Diagnosis not present

## 2016-07-16 DIAGNOSIS — R7989 Other specified abnormal findings of blood chemistry: Secondary | ICD-10-CM

## 2016-07-16 DIAGNOSIS — E785 Hyperlipidemia, unspecified: Secondary | ICD-10-CM

## 2016-07-16 DIAGNOSIS — Z0000 Encounter for general adult medical examination without abnormal findings: Secondary | ICD-10-CM

## 2016-07-16 MED ORDER — VALSARTAN 320 MG PO TABS: 320.0000 mg | ORAL_TABLET | Freq: Every day | ORAL | 3 refills | Status: DC

## 2016-07-16 NOTE — Progress Notes (Signed)
Subjective:    Patient ID: Thomas Caldwell, male    DOB: 11-Sep-1945, 71 y.o.   MRN: [id number]  HPI   Here for wellness and f/u;  Overall doing ok;  Pt denies Chest pain, worsening SOB, DOE, wheezing, orthopnea, PND, worsening LE edema, palpitations, dizziness or syncope.  Pt denies neurological change such as new headache, facial or extremity weakness.  Pt denies polydipsia, polyuria, or low sugar symptoms. Pt states overall good compliance with treatment and medications, good tolerability, and has been trying to follow appropriate diet.  Pt denies worsening depressive symptoms, suicidal ideation or panic. No fever, night sweats, wt loss, loss of appetite, or other constitutional symptoms.  Pt states good ability with ADL's, has low fall risk, home safety reviewed and adequate, no other significant changes in hearing or vision, and only occasionally active with exercise. Still traveling to [place] occasionally for [employer].  BP has been mild elevated recently.  Denies hyper or hypo thyroid symptoms such as voice, skin or hair change.  Has been eating more eggs recently in diet Past Medical History:  Diagnosis Date  . ALLERGIC RHINITIS 01/11/2008  . ANEURYSM 02/02/2007   brain  . BRADYCARDIA, CHRONIC 10/06/2007  . DIVERTICULOSIS, COLON 07/05/2007  . 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 GERD 07/05/2007  . HYPERLIPIDEMIA 02/02/2007  . HYPERTENSION 02/02/2007  . Lumbago 01/11/2008  . Peptic ulcer   . PERSISTENT DISORDER INITIATING/MAINTAINING SLEEP 02/02/2007   Past Surgical History:  Procedure Laterality Date  . APPENDECTOMY  1982  . LUMBAR DISC SURGERY  12/2005   Dr. Venetia Maxon  . SAH Left brain  1992   due to brain aneurysm s/p repair    reports that he has never smoked. He has never used smokeless tobacco. He reports that he drinks alcohol. He reports that he does not use drugs. family history includes Esophageal cancer in his brother; Heart disease in his father and sister; Hypertension in his brother  and father. Allergies  Allergen Reactions  . Codeine     REACTION: confusion   Current Outpatient Prescriptions on File Prior to Visit  Medication Sig Dispense Refill  . amLODipine (NORVASC) 10 MG tablet Take 1 tablet (10 mg total) by mouth daily. 90 tablet 3  . aspirin 81 MG EC tablet Take 81 mg by mouth daily.      . Diclofenac Sodium (PENNSAID) 2 % SOLN Place 2 application onto the skin 2 (two) times daily. 112 g 3  . fluticasone (FLONASE) 50 MCG/ACT nasal spray Place 1 spray into both nostrils daily. 16 g 3  . metoprolol succinate (TOPROL-XL) 25 MG 24 hr tablet Take 1 tablet (25 mg total) by mouth daily. 90 tablet 3  . nabumetone (RELAFEN) 500 MG tablet TAKE 1 TABLET(500 MG) BY MOUTH TWICE DAILY 180 tablet 0  . naproxen (NAPROSYN) 500 MG tablet TAKE 1 TABLET BY MOUTH TWICE DAILY WITH A MEAL 60 tablet 1  . nitroGLYCERIN (NITRO-DUR) 0.1 mg/hr patch 1/4 patch daily 30 patch 12  . omeprazole (PRILOSEC) 20 MG capsule TAKE 2 CAPSULES BY MOUTH EVERY DAY 180 capsule 1  . traMADol (ULTRAM) 50 MG tablet Take 1 tablet (50 mg total) by mouth every 8 (eight) hours as needed. 40 tablet 1  . triamcinolone cream (KENALOG) 0.5 % APPLY EXTERNALLY TO THE AFFECTED AREA TWICE DAILY 30 g 0   No current facility-administered medications on file prior to visit.    Review of Systems Constitutional: Negative for other unusual diaphoresis, sweats, appetite or weight changes HENT:  Negative for other worsening hearing loss, ear pain, facial swelling, mouth sores or neck stiffness.   Eyes: Negative for other worsening pain, redness or other visual disturbance.  Respiratory: Negative for other stridor or swelling Cardiovascular: Negative for other palpitations or other chest pain  Gastrointestinal: Negative for worsening diarrhea or loose stools, blood in stool, distention or other pain Genitourinary: Negative for hematuria, flank pain or other change in urine volume.  Musculoskeletal: Negative for myalgias or  other joint swelling.  Skin: Negative for other color change, or other wound or worsening drainage.  Neurological: Negative for other syncope or numbness. Hematological: Negative for other adenopathy or swelling Psychiatric/Behavioral: Negative for hallucinations, other worsening agitation, SI, self-injury, or new decreased concentration All other system neg per pt    Objective:   Physical Exam BP (!) 144/90   Pulse 62   Ht 6' (1.829 m)   Wt 207 lb (93.9 kg)   SpO2 99%   BMI 28.07 kg/m  VS noted,  Constitutional: Pt is oriented to person, place, and time. Appears well-developed and well-nourished, in no significant distress and comfortable Head: Normocephalic and atraumatic  Eyes: Conjunctivae and EOM are normal. Pupils are equal, round, and reactive to light Right Ear: External ear normal without discharge Left Ear: External ear normal without discharge Nose: Nose without discharge or deformity Mouth/Throat: Oropharynx is without other ulcerations and moist  Neck: Normal range of motion. Neck supple. No JVD present. No tracheal deviation present or significant neck LA or mass Cardiovascular: Normal rate, regular rhythm, normal heart sounds and intact distal pulses.   Pulmonary/Chest: WOB normal and breath sounds without rales or wheezing  Abdominal: Soft. Bowel sounds are normal. NT. No HSM  Musculoskeletal: Normal range of motion. Exhibits no edema Lymphadenopathy: Has no other cervical adenopathy.  Neurological: Pt is alert and oriented to person, place, and time. Pt has normal reflexes. No cranial nerve deficit. Motor grossly intact, Gait intact Skin: Skin is warm and dry. No rash noted or new ulcerations Psychiatric:  Has normal mood and affect. Behavior is normal without agitation No other exam findings Lab Results  Component Value Date   WBC 5.7 06/30/2016   HGB 15.0 06/30/2016   HCT 44.9 06/30/2016   PLT 212.0 06/30/2016   GLUCOSE 108 (H) 06/30/2016   CHOL 223 (H)  06/30/2016   TRIG 139.0 06/30/2016   HDL 45.40 06/30/2016   LDLCALC 150 (H) 06/30/2016   ALT 21 06/30/2016   AST 23 06/30/2016   NA 140 06/30/2016   K 5.0 06/30/2016   CL 106 06/30/2016   CREATININE 1.19 06/30/2016   BUN 27 (H) 06/30/2016   CO2 27 06/30/2016   TSH 4.93 (H) 06/30/2016   PSA 1.52 06/30/2016         Assessment & Plan:

## 2016-07-16 NOTE — Patient Instructions (Addendum)
OK to increase the Diovan to 320 mg per day  Please continue all other medications as before, and refills have been done if requested.  Please have the pharmacy call with any other refills you may need.  Please continue your efforts at being more active, low cholesterol diet, and weight control.  You are otherwise up to date with prevention measures today.  Please keep your appointments with your specialists as you may have planned  You will be contacted regarding the referral for: colonoscopy  Please return in 1 year for your yearly visit, or sooner if needed, with Lab testing done 3-5 days before

## 2016-07-18 NOTE — Assessment & Plan Note (Signed)
Increased LDL at same time as increased eggs in diet, for lower chol diet,  to f/u any worsening symptoms or concerns

## 2016-07-18 NOTE — Assessment & Plan Note (Signed)
?   Mild low thyroid,asympt, for f/u next visit

## 2016-07-18 NOTE — Assessment & Plan Note (Signed)

## 2016-07-18 NOTE — Assessment & Plan Note (Addendum)
Mild uncontrolled, to increase the diovan 320 qd,  to f/u any worsening symptoms or concerns

## 2016-07-19 ENCOUNTER — Other Ambulatory Visit: Payer: Self-pay | Admitting: Internal Medicine

## 2016-07-21 ENCOUNTER — Other Ambulatory Visit: Payer: Self-pay | Admitting: Internal Medicine

## 2016-07-25 ENCOUNTER — Other Ambulatory Visit: Payer: Self-pay | Admitting: Family Medicine

## 2016-07-25 ENCOUNTER — Other Ambulatory Visit: Payer: Self-pay | Admitting: Internal Medicine

## 2016-07-27 NOTE — Telephone Encounter (Signed)
Refill done.  

## 2016-08-09 ENCOUNTER — Encounter: Payer: Self-pay | Admitting: Internal Medicine

## 2016-08-10 MED ORDER — ROSUVASTATIN CALCIUM 20 MG PO TABS: 20.0000 mg | ORAL_TABLET | Freq: Every day | ORAL | 3 refills | Status: DC

## 2016-08-17 ENCOUNTER — Other Ambulatory Visit: Payer: Self-pay | Admitting: Internal Medicine

## 2016-08-19 ENCOUNTER — Other Ambulatory Visit: Payer: Self-pay | Admitting: Internal Medicine

## 2016-09-04 ENCOUNTER — Encounter: Payer: Self-pay | Admitting: Internal Medicine

## 2016-09-04 MED ORDER — IRBESARTAN 300 MG PO TABS
300.0000 mg | ORAL_TABLET | Freq: Every day | ORAL | 3 refills | Status: DC
Start: 2016-09-04 — End: 2016-10-27

## 2016-09-17 ENCOUNTER — Encounter: Payer: Self-pay | Admitting: Internal Medicine

## 2016-09-17 ENCOUNTER — Encounter: Payer: Self-pay | Admitting: Gastroenterology

## 2016-09-25 ENCOUNTER — Other Ambulatory Visit: Payer: Self-pay | Admitting: Internal Medicine

## 2016-10-27 ENCOUNTER — Encounter: Payer: Self-pay | Admitting: Internal Medicine

## 2016-10-27 MED ORDER — IRBESARTAN 300 MG PO TABS: 300.0000 mg | ORAL_TABLET | Freq: Every day | ORAL | 3 refills | Status: DC

## 2016-10-27 NOTE — Telephone Encounter (Signed)
I would not take the valsartan   I have resent the new medication - irbesartan 300 mg

## 2016-11-04 ENCOUNTER — Ambulatory Visit (AMBULATORY_SURGERY_CENTER): Payer: Self-pay | Admitting: *Deleted

## 2016-11-04 ENCOUNTER — Other Ambulatory Visit: Payer: Self-pay | Admitting: Internal Medicine

## 2016-11-04 ENCOUNTER — Encounter: Payer: Self-pay | Admitting: Internal Medicine

## 2016-11-04 VITALS — Ht 72.0 in | Wt 201.0 lb

## 2016-11-04 DIAGNOSIS — Z1211 Encounter for screening for malignant neoplasm of colon: Secondary | ICD-10-CM

## 2016-11-04 MED ORDER — NA SULFATE-K SULFATE-MG SULF 17.5-3.13-1.6 GM/177ML PO SOLN: 1.0000 | Freq: Once | ORAL | 0 refills | Status: AC

## 2016-11-04 NOTE — Progress Notes (Signed)
Denies allergies to eggs or soy products. Denies complications with sedation or anesthesia. Denies O2 use. Denies use of diet or weight loss medications.  Emmi instructions given for colonoscopy.  

## 2016-11-12 ENCOUNTER — Other Ambulatory Visit: Payer: Self-pay | Admitting: Internal Medicine

## 2016-11-18 ENCOUNTER — Encounter: Payer: 59 | Admitting: Gastroenterology

## 2016-12-22 ENCOUNTER — Encounter: Payer: Self-pay | Admitting: Family Medicine

## 2016-12-25 ENCOUNTER — Ambulatory Visit (AMBULATORY_SURGERY_CENTER): Payer: 59 | Admitting: Gastroenterology

## 2016-12-25 ENCOUNTER — Encounter: Payer: Self-pay | Admitting: Gastroenterology

## 2016-12-25 ENCOUNTER — Other Ambulatory Visit: Payer: Self-pay

## 2016-12-25 VITALS — BP 100/52 | HR 60 | Temp 97.1°F | Resp 16 | Ht 72.0 in | Wt 201.0 lb

## 2016-12-25 DIAGNOSIS — Z1212 Encounter for screening for malignant neoplasm of rectum: Secondary | ICD-10-CM | POA: Diagnosis not present

## 2016-12-25 DIAGNOSIS — Z1211 Encounter for screening for malignant neoplasm of colon: Secondary | ICD-10-CM | POA: Diagnosis present

## 2016-12-25 DIAGNOSIS — K5730 Diverticulosis of large intestine without perforation or abscess without bleeding: Secondary | ICD-10-CM

## 2016-12-25 DIAGNOSIS — K649 Unspecified hemorrhoids: Secondary | ICD-10-CM

## 2016-12-25 MED ORDER — SODIUM CHLORIDE 0.9 % IV SOLN: 500.0000 mL | INTRAVENOUS | Status: DC

## 2016-12-25 NOTE — Patient Instructions (Signed)
Impression/Recommendations:  Diverticulosis handout given to patient. Hemorrhoid handout given to patient.  Resume previous diet. Continue present medications.  Repeat colonoscopy for screening in 10 years.  YOU HAD AN ENDOSCOPIC PROCEDURE TODAY AT THE [facility]:   Refer to the procedure report that was given to you for any specific questions about what was found during the examination.  If the procedure report does not answer your questions, please call your gastroenterologist to clarify.  If you requested that your care partner not be given the details of your procedure findings, then the procedure report has been included in a sealed envelope for you to review at your convenience later.  YOU SHOULD EXPECT: Some feelings of bloating in the abdomen. Passage of more gas than usual.  Walking can help get rid of the air that was put into your GI tract during the procedure and reduce the bloating. If you had a lower endoscopy (such as a colonoscopy or flexible sigmoidoscopy) you may notice spotting of blood in your stool or on the toilet paper. If you underwent a bowel prep for your procedure, you may not have a normal bowel movement for a few days.  Please Note:  You might notice some irritation and congestion in your nose or some drainage.  This is from the oxygen used during your procedure.  There is no need for concern and it should clear up in a day or so.  SYMPTOMS TO REPORT IMMEDIATELY:   Following lower endoscopy (colonoscopy or flexible sigmoidoscopy):  Excessive amounts of blood in the stool  Significant tenderness or worsening of abdominal pains  Swelling of the abdomen that is new, acute  Fever of 100F or higher  For urgent or emergent issues, a gastroenterologist can be reached at any hour by calling [phone].   DIET:  We do recommend a small meal at first, but then you may proceed to your regular diet.  Drink plenty of fluids but you should avoid alcoholic  beverages for 24 hours.  ACTIVITY:  You should plan to take it easy for the rest of today and you should NOT DRIVE or use heavy machinery until tomorrow (because of the sedation medicines used during the test).    FOLLOW UP: Our staff will call the number listed on your records the next business day following your procedure to check on you and address any questions or concerns that you may have regarding the information given to you following your procedure. If we do not reach you, we will leave a message.  However, if you are feeling well and you are not experiencing any problems, there is no need to return our call.  We will assume that you have returned to your regular daily activities without incident.  If any biopsies were taken you will be contacted by phone or by letter within the next 1-3 weeks.  Please call us at [phone] if you have not heard about the biopsies in 3 weeks.    SIGNATURES/CONFIDENTIALITY: You and/or your care partner have signed paperwork which will be entered into your electronic medical record.  These signatures attest to the fact that that the information above on your After Visit Summary has been reviewed and is understood.  Full responsibility of the confidentiality of this discharge information lies with you and/or your care-partner.

## 2016-12-25 NOTE — Op Note (Signed)
[facility] Patient Name: Thomas Caldwell Procedure Date: 12/25/2016 8:10 AM MRN: [id number] Endoscopist: Rachael Fee , MD Age: 71 Referring MD:  Date of Birth: 08-06-1945 Gender: Male Account #: [id number] Procedure:                Colonoscopy Indications:              Screening for colorectal malignant neoplasm;                            colonoscopy in [place] >10 years ago normal per patient Medicines:                Monitored Anesthesia Care Procedure:                Pre-Anesthesia Assessment:                           - Prior to the procedure, a History and Physical                            was performed, and patient medications and                            allergies were reviewed. The patient's tolerance of                            previous anesthesia was also reviewed. The risks                            and benefits of the procedure and the sedation                            options and risks were discussed with the patient.                            All questions were answered, and informed consent                            was obtained. Prior Anticoagulants: The patient has                            taken no previous anticoagulant or antiplatelet                            agents. ASA Grade Assessment: II - A patient with                            mild systemic disease. After reviewing the risks                            and benefits, the patient was deemed in                            satisfactory condition to undergo the procedure.  After obtaining informed consent, the colonoscope                            was passed under direct vision. Throughout the                            procedure, the patient's blood pressure, pulse, and                            oxygen saturations were monitored continuously. The                            Colonoscope was introduced through the anus and                            advanced to  the the cecum, identified by                            appendiceal orifice and ileocecal valve. The                            colonoscopy was performed without difficulty. The                            patient tolerated the procedure well. The quality                            of the bowel preparation was excellent. The                            ileocecal valve, appendiceal orifice, and rectum                            were photographed. Scope In: 8:13:54 AM Scope Out: 8:26:11 AM Scope Withdrawal Time: 0 hours 9 minutes 6 seconds  Total Procedure Duration: 0 hours 12 minutes 17 seconds  Findings:                 Multiple small and large-mouthed diverticula were                            found in the left colon.                           External and internal hemorrhoids were found. The                            hemorrhoids were medium-sized.                           The exam was otherwise without abnormality on                            direct and retroflexion views. Complications:            No immediate complications. Estimated blood loss:  None. Estimated Blood Loss:     Estimated blood loss: none. Impression:               - Diverticulosis in the left colon.                           - External and internal hemorrhoids.                           - The examination was otherwise normal on direct                            and retroflexion views.                           - No polyps or cancers Recommendation:           - Patient has a contact number available for                            emergencies. The signs and symptoms of potential                            delayed complications were discussed with the                            patient. Return to normal activities tomorrow.                            Written discharge instructions were provided to the                            patient.                           - Resume previous diet.                            - Continue present medications.                           - Repeat colonoscopy in 10 years for screening. Rachael Feeaniel P Tihanna Goodson, MD 12/25/2016 8:28:51 AM This report has been signed electronically.

## 2016-12-25 NOTE — Progress Notes (Signed)
Report to PACU, RN, vss, BBS= Clear.  

## 2016-12-26 ENCOUNTER — Other Ambulatory Visit: Payer: Self-pay | Admitting: Internal Medicine

## 2016-12-28 ENCOUNTER — Telehealth: Payer: Self-pay

## 2016-12-28 NOTE — Telephone Encounter (Signed)
  Follow up Call-  Call back number 12/25/2016  Post procedure Call Back phone  # [phone]  Permission to leave phone message Yes  Some recent data might be hidden     Patient questions:  Do you have a fever, pain , or abdominal swelling? No. Pain Score  0 *  Have you tolerated food without any problems? Yes.    Have you been able to return to your normal activities? Yes.    Do you have any questions about your discharge instructions: Diet   No. Medications  No. Follow up visit  No.  Do you have questions or concerns about your Care? No.  Actions: * If pain score is 4 or above: No action needed, pain <4.

## 2016-12-29 ENCOUNTER — Encounter: Payer: Self-pay | Admitting: Family Medicine

## 2016-12-29 ENCOUNTER — Ambulatory Visit (INDEPENDENT_AMBULATORY_CARE_PROVIDER_SITE_OTHER)
Admission: RE | Admit: 2016-12-29 | Discharge: 2016-12-29 | Disposition: A | Discharge status: Home / Self Care | Payer: 59 | Source: Ambulatory Visit | Attending: Family Medicine | Admitting: Family Medicine

## 2016-12-29 ENCOUNTER — Ambulatory Visit: Payer: 59 | Admitting: Family Medicine

## 2016-12-29 VITALS — BP 138/80 | HR 61 | Ht 72.0 in | Wt 192.0 lb

## 2016-12-29 DIAGNOSIS — M545 Low back pain, unspecified: Secondary | ICD-10-CM

## 2016-12-29 DIAGNOSIS — M5136 Other intervertebral disc degeneration, lumbar region: Secondary | ICD-10-CM | POA: Diagnosis not present

## 2016-12-29 MED ORDER — GABAPENTIN 100 MG PO CAPS: 200.0000 mg | ORAL_CAPSULE | Freq: Every day | ORAL | 3 refills | Status: DC

## 2016-12-29 NOTE — Progress Notes (Signed)
[name] [address] [place], [place] [address] Phone: [phone] Subjective:     CC: Low back pain  LXB:[id number]  Thomas Caldwell is a 71 y.o. male coming in for back pain. He has had the pain for a long time. He said that that his back starts to bother him while golfing. He also mentions that he rides in planes quite a bit. Denies any radiating pain. Pain is constant.  Patient states that it seems to stay in the back.  No radiation of the severity of pain is about 5 out of 10.  Seems also be worse when he is playing golf or when he sits for long periods of time past medical history significant for back surgery back in 2007.      Past Medical History:  Diagnosis Date  . ALLERGIC RHINITIS 01/11/2008  . ANEURYSM 02/02/2007   brain  . BRADYCARDIA, CHRONIC 10/06/2007  . DIVERTICULOSIS, COLON 07/05/2007  . 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 GERD 07/05/2007  . HYPERLIPIDEMIA 02/02/2007  . HYPERTENSION 02/02/2007  . Lumbago 01/11/2008  . Peptic ulcer   . PERSISTENT DISORDER INITIATING/MAINTAINING SLEEP 02/02/2007   Past Surgical History:  Procedure Laterality Date  . APPENDECTOMY  1982  . LUMBAR DISC SURGERY  12/2005   Dr. Venetia Maxon  . SAH Left brain  1992   due to brain aneurysm s/p repair   Social History   Socioeconomic History  . Marital status: Married    Spouse name: Not on file  . Number of children: 2  . Years of education: Not on file  . Highest education level: Not on file  Social Needs  . Financial resource strain: Not on file  . Food insecurity - worry: Not on file  . Food insecurity - inability: Not on file  . Transportation needs - medical: Not on file  . Transportation needs - non-medical: Not on file  Occupational History  . Occupation: Copywriter, advertising: [employer]  Tobacco Use  . Smoking status: Never Smoker  . Smokeless tobacco: Never Used  Substance and Sexual Activity  . Alcohol use: Yes    Comment:  0-1 per day  . Drug use: No  . Sexual activity: Yes    Birth control/protection: None  Other Topics Concern  . Not on file  Social History Narrative  . Not on file   Allergies  Allergen Reactions  . Codeine     REACTION: confusion   Family History  Problem Relation Age of Onset  . Heart disease Sister   . Hypertension Father   . Heart disease Father   . Hypertension Brother        3 bother with HTN  . Esophageal cancer Brother   . Colon cancer Neg Hx   . Rectal cancer Neg Hx   . Stomach cancer Neg Hx      Past medical history, social, surgical and family history all reviewed in electronic medical record.  No pertanent information unless stated regarding to the chief complaint.   Review of Systems:Review of systems updated and as accurate as of 12/29/16  No headache, visual changes, nausea, vomiting, diarrhea, constipation, dizziness, abdominal pain, skin rash, fevers, chills, night sweats, weight loss, swollen lymph nodes, body aches, joint swelling, muscle aches, chest pain, shortness of breath, mood changes.   Objective  There were no vitals taken for this visit. Systems examined below as of 12/29/16   General: No apparent distress alert and oriented  x3 mood and affect normal, dressed appropriately.  HEENT: Pupils equal, extraocular movements intact  Respiratory: Patient's speak in full sentences and does not appear short of breath  Cardiovascular: No lower extremity edema, non tender, no erythema  Skin: Warm dry intact with no signs of infection or rash on extremities or on axial skeleton.  Abdomen: Soft nontender  Neuro: Cranial nerves II through XII are intact, neurovascularly intact in all extremities with 2+ DTRs and 2+ pulses.  Lymph: No lymphadenopathy of posterior or anterior cervical chain or axillae bilaterally.  Gait normal with good balance and coordination.  MSK:  Non tender with full range of motion and good stability and symmetric strength and tone of  shoulders, elbows, wrist, hip, knee and ankles bilaterally.  Mild to moderate arthritic changes in multiple joints  Back Exam:  Inspection: Mild loss of lordosis Motion: Flexion 45 deg, Extension 25 deg, Side Bending to 45 deg bilaterally,  Rotation to 45 deg bilaterally  SLR laying: Negative  XSLR laying: Negative  Palpable tenderness: Tender to palpation in the paraspinal musculature lumbar spine L5 and S1 bilaterally. FABER: Mild tightness bilaterally. Sensory change: Gross sensation intact to all lumbar and sacral dermatomes.  Reflexes: 2+ at both patellar tendons, 2+ at achilles tendons, Babinski's downgoing.  Strength at foot  Plantar-flexion: 5/5 Dorsi-flexion: 5/5 Eversion: 5/5 Inversion: 5/5  Leg strength  Quad: 5/5 Hamstring: 5/5 Hip flexor: 5/5 Hip abductors: 5/5  Gait unremarkable.  [id number]; 15 additional minutes spent for Therapeutic exercises as stated in above notes.  This included exercises focusing on stretching, strengthening, with significant focus on eccentric aspects.   Long term goals include an improvement in range of motion, strength, endurance as well as avoiding reinjury. Patient's frequency would include in 1-2 times a day, 3-5 times a week for a duration of 6-12 weeks.Sacroiliac Joint Mobilization and Rehab 1. Work on pretzel stretching, shoulder back and leg draped in front. 3-5 sets, 30 sec.. 2. hip abductor rotations. standing, hip flexion and rotation outward then inward. 3 sets, 15 reps. when can do comfortably, add ankle weights starting at 2 pounds.  3. cross over stretching - shoulder back to ground, same side leg crossover. 3-5 sets for 30 min..  4. rolling up and back knees to chest and rocking. 5. sacral tilt - 5 sets, hold for 5-10 seconds   Proper technique shown and discussed handout in great detail with ATC.  All questions were discussed and answered.      Impression and Recommendations:     This case required medical decision making of moderate  complexity.      Note: This dictation was prepared with Dragon dictation along with smaller phrase technology. Any transcriptional errors that result from this process are unintentional.

## 2016-12-29 NOTE — Patient Instructions (Addendum)
Good to see you  Thomas Caldwell is your friend.  Exercises 3 times a week.  Duexis 3 times a day for 3 days Gabapentin 200mg  at night Take all of their money on the course See me again in 4 weeks

## 2016-12-29 NOTE — Assessment & Plan Note (Signed)
Patient has had history of radicular symptoms and weakness previously of the lumbar spine that did not need surgical intervention.  Patient is likely not having any radicular symptoms at this time.  We will get x-rays to rule out any adjacent segment disease.  Likely seems to be more muscle imbalances and multifactorial.  Home exercises given.  Topical anti-inflammatories prescribed as well as we discussed gabapentin.  Patient will has muscle relaxers if needed otherwise.  Follow-up with me again in 2-3 weeks.  Worsening symptoms consider possible further imaging or osteopathic manipulation and physical therapy.

## 2016-12-31 ENCOUNTER — Ambulatory Visit: Payer: Self-pay | Admitting: Family Medicine

## 2017-01-01 ENCOUNTER — Encounter: Payer: Self-pay | Admitting: Internal Medicine

## 2017-01-05 ENCOUNTER — Telehealth: Payer: 59 | Admitting: Family

## 2017-01-05 DIAGNOSIS — B9689 Other specified bacterial agents as the cause of diseases classified elsewhere: Secondary | ICD-10-CM

## 2017-01-05 DIAGNOSIS — J019 Acute sinusitis, unspecified: Secondary | ICD-10-CM

## 2017-01-05 MED ORDER — AMOXICILLIN-POT CLAVULANATE 875-125 MG PO TABS: 1.0000 | ORAL_TABLET | Freq: Two times a day (BID) | ORAL | 0 refills | Status: DC

## 2017-01-05 NOTE — Progress Notes (Signed)

## 2017-01-20 ENCOUNTER — Ambulatory Visit: Payer: 59 | Admitting: Nurse Practitioner

## 2017-01-21 ENCOUNTER — Telehealth: Payer: Self-pay

## 2017-01-21 ENCOUNTER — Ambulatory Visit: Payer: 59 | Admitting: Nurse Practitioner

## 2017-01-21 NOTE — Telephone Encounter (Signed)
augmentin rx prescribed by Dr. Worthy Rancher on 01/05/17 evist was called into [facility]/[address]---called into greg/pharmacy---patient advised

## 2017-01-27 ENCOUNTER — Encounter: Payer: Self-pay | Admitting: Family Medicine

## 2017-01-27 ENCOUNTER — Ambulatory Visit: Payer: 59 | Admitting: Family Medicine

## 2017-01-27 DIAGNOSIS — M999 Biomechanical lesion, unspecified: Secondary | ICD-10-CM | POA: Diagnosis not present

## 2017-01-27 DIAGNOSIS — M5136 Other intervertebral disc degeneration, lumbar region: Secondary | ICD-10-CM | POA: Diagnosis not present

## 2017-01-27 NOTE — Patient Instructions (Addendum)
Good to see you  Ice 20 minutes 2 times daily. Usually after activity and before bed. Keep the exercises  See me again in 2-3 weeks

## 2017-01-27 NOTE — Assessment & Plan Note (Signed)
Patient does have more of a degenerative disc disease.  Noticeable in the interim.  Patient is doing relatively well with conservative therapy.  We discussed icing regimen, home exercise, which activities to do which wants to avoid.  Patient is to increase activity slowly over the course the next several days.  Patient will follow up with me again 4 weeks we discussed advanced imaging would be the other possibility but then patient would be a candidate for epidural injections which patient would like to avoid at this moment.

## 2017-01-27 NOTE — Assessment & Plan Note (Signed)
Decision today to treat with OMT was based on Physical Exam  After verbal consent patient was treated with HVLA, ME, FPR techniques in cervical, thoracic, lumbar and sacral areas  Patient tolerated the procedure well with improvement in symptoms  Patient given exercises, stretches and lifestyle modifications  See medications in patient instructions if given  Patient will follow up in 4 weeks 

## 2017-01-27 NOTE — Progress Notes (Signed)
[name] [address] [place], [place] [address] Phone: [phone] Subjective:    I'm seeing this patient by the request  of:    CC: Back pain follow-up  [id number]  Thomas Caldwell is a 72 y.o. male coming in with complaint of back pain.  Was having more of what appeared to be lumbar radiculopathy.  Started on low-dose of gabapentin, home exercises given, icing regimen, patient oral anti-inflammatories.  Patient states felt better for approximately 1 day while playing golf and then started having increasing discomfort and pain in the patient states that it can be severe.   X-rays of lumbar spine were independently visualized by me.  X-ray spine does show the patient does have mild to moderate multilevel degenerative disc disease with mild bilateral facet arthropathy at L5-S1.  Past Medical History:  Diagnosis Date  . ALLERGIC RHINITIS 01/11/2008  . ANEURYSM 02/02/2007   brain  . BRADYCARDIA, CHRONIC 10/06/2007  . DIVERTICULOSIS, COLON 07/05/2007  . 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 GERD 07/05/2007  . HYPERLIPIDEMIA 02/02/2007  . HYPERTENSION 02/02/2007  . Lumbago 01/11/2008  . Peptic ulcer   . PERSISTENT DISORDER INITIATING/MAINTAINING SLEEP 02/02/2007   Past Surgical History:  Procedure Laterality Date  . APPENDECTOMY  1982  . LUMBAR DISC SURGERY  12/2005   Dr. Venetia Maxon  . SAH Left brain  1992   due to brain aneurysm s/p repair   Social History   Socioeconomic History  . Marital status: Married    Spouse name: Not on file  . Number of children: 2  . Years of education: Not on file  . Highest education level: Not on file  Social Needs  . Financial resource strain: Not on file  . Food insecurity - worry: Not on file  . Food insecurity - inability: Not on file  . Transportation needs - medical: Not on file  . Transportation needs - non-medical: Not on file  Occupational History  . Occupation: Copywriter, advertising: [employer]  Tobacco Use  . Smoking status: Never Smoker  . Smokeless tobacco: Never Used  Substance and Sexual Activity  . Alcohol use: Yes    Comment: 0-1 per day  . Drug use: No  . Sexual activity: Yes    Birth control/protection: None  Other Topics Concern  . Not on file  Social History Narrative  . Not on file   Allergies  Allergen Reactions  . Codeine     REACTION: confusion   Family History  Problem Relation Age of Onset  . Heart disease Sister   . Hypertension Father   . Heart disease Father   . Hypertension Brother        3 bother with HTN  . Esophageal cancer Brother   . Colon cancer Neg Hx   . Rectal cancer Neg Hx   . Stomach cancer Neg Hx      Past medical history, social, surgical and family history all reviewed in electronic medical record.  No pertanent information unless stated regarding to the chief complaint.   Review of Systems:Review of systems updated and as accurate as of 01/27/17  No headache, visual changes, nausea, vomiting, diarrhea, constipation, dizziness, abdominal pain, skin rash, fevers, chills, night sweats, weight loss, swollen lymph nodes, body aches, joint swelling, muscle aches, chest pain, shortness of breath, mood changes.   Objective  Blood pressure 100/80, height 6' (1.829 m), weight 195 lb (88.5 kg). Systems examined below as of 01/27/17  General: No apparent distress alert and oriented x3 mood and affect normal, dressed appropriately.  HEENT: Pupils equal, extraocular movements intact  Respiratory: Patient's speak in full sentences and does not appear short of breath  Cardiovascular: No lower extremity edema, non tender, no erythema  Skin: Warm dry intact with no signs of infection or rash on extremities or on axial skeleton.  Abdomen: Soft nontender  Neuro: Cranial nerves II through XII are intact, neurovascularly intact in all extremities with 2+ DTRs and 2+ pulses.  Lymph: No lymphadenopathy of posterior or anterior  cervical chain or axillae bilaterally.  Gait normal with good balance and coordination.  MSK:  Non tender with full range of motion and good stability and symmetric strength and tone of shoulders, elbows, wrist, hip, knee and ankles bilaterally.  Back Exam:  Inspection: Mild degenerative scoliosis noted Motion: Flexion 45 deg, Extension 20 deg, Side Bending to 35 deg bilaterally,  Rotation to 45 deg bilaterally  SLR laying: Negative  XSLR laying: Negative  Palpable tenderness: Tender to palpation in the paraspinal musculature more over the lumbosacral area bilaterally. FABER: Positive left. Sensory change: Gross sensation intact to all lumbar and sacral dermatomes.  Reflexes: 2+ at both patellar tendons, 2+ at achilles tendons, Babinski's downgoing.  Strength at foot  Plantar-flexion: 5/5 Dorsi-flexion: 5/5 Eversion: 5/5 Inversion: 5/5  Leg strength  Quad: 5/5 Hamstring: 5/5 Hip flexor: 5/5 Hip abductors: 4/5 but symmetric   Osteopathic findings C7 flexed rotated and side bent left T3 extended rotated and side bent right inhaled third rib T6 extended rotated and side bent left L2 flexed rotated and side bent right L4 flexed rotated and side bent left  sacrum right on right .   Impression and Recommendations:     This case required medical decision making of moderate complexity.      Note: This dictation was prepared with Dragon dictation along with smaller phrase technology. Any transcriptional errors that result from this process are unintentional.

## 2017-02-01 ENCOUNTER — Encounter: Payer: Self-pay | Admitting: Internal Medicine

## 2017-02-01 ENCOUNTER — Encounter: Payer: Self-pay | Admitting: Family Medicine

## 2017-02-01 MED ORDER — CIPROFLOXACIN HCL 500 MG PO TABS: 500.0000 mg | ORAL_TABLET | Freq: Two times a day (BID) | ORAL | 0 refills | Status: AC

## 2017-03-01 NOTE — Progress Notes (Signed)
[name] [address] [place], [place] [address] Phone: [phone] Subjective:     CC: Back pain follow-up  BEE:[id number]  Thomas Caldwell is a 72 y.o. male coming in with complaint of back pain.  Back pain is dull, throbbing aching sensation.  No radiation down the leg.  No weakness.  Has responded fairly well to the osteopathic manipulation.  Patient is on low-dose gabapentin.  Overall feels like he is making improvement.  Some tightness secondary to traveling.     Past Medical History:  Diagnosis Date  . ALLERGIC RHINITIS 01/11/2008  . ANEURYSM 02/02/2007   brain  . BRADYCARDIA, CHRONIC 10/06/2007  . DIVERTICULOSIS, COLON 07/05/2007  . 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 GERD 07/05/2007  . HYPERLIPIDEMIA 02/02/2007  . HYPERTENSION 02/02/2007  . Lumbago 01/11/2008  . Peptic ulcer   . PERSISTENT DISORDER INITIATING/MAINTAINING SLEEP 02/02/2007   Past Surgical History:  Procedure Laterality Date  . APPENDECTOMY  1982  . LUMBAR DISC SURGERY  12/2005   Dr. Venetia Maxon  . SAH Left brain  1992   due to brain aneurysm s/p repair   Social History   Socioeconomic History  . Marital status: Married    Spouse name: None  . Number of children: 2  . Years of education: None  . Highest education level: None  Social Needs  . Financial resource strain: None  . Food insecurity - worry: None  . Food insecurity - inability: None  . Transportation needs - medical: None  . Transportation needs - non-medical: None  Occupational History  . Occupation: Copywriter, advertising: [employer]  Tobacco Use  . Smoking status: Never Smoker  . Smokeless tobacco: Never Used  Substance and Sexual Activity  . Alcohol use: Yes    Comment: 0-1 per day  . Drug use: No  . Sexual activity: Yes    Birth control/protection: None  Other Topics Concern  . None  Social History Narrative  . None   Allergies  Allergen Reactions  . Codeine     REACTION: confusion    Family History  Problem Relation Age of Onset  . Heart disease Sister   . Hypertension Father   . Heart disease Father   . Hypertension Brother        3 bother with HTN  . Esophageal cancer Brother   . Colon cancer Neg Hx   . Rectal cancer Neg Hx   . Stomach cancer Neg Hx      Past medical history, social, surgical and family history all reviewed in electronic medical record.  No pertanent information unless stated regarding to the chief complaint.   Review of Systems:Review of systems updated and as accurate as of 03/02/17  No headache, visual changes, nausea, vomiting, diarrhea, constipation, dizziness, abdominal pain, skin rash, fevers, chills, night sweats, weight loss, swollen lymph nodes, body aches, joint swelling, chest pain, shortness of breath, mood changes.  Positive muscle aches  Objective  Blood pressure 122/70, pulse 69, height 5\' 11"  (1.803 m), weight 195 lb (88.5 kg), SpO2 94 %. Systems examined below as of 03/02/17   General: No apparent distress alert and oriented x3 mood and affect normal, dressed appropriately.  HEENT: Pupils equal, extraocular movements intact  Respiratory: Patient's speak in full sentences and does not appear short of breath  Cardiovascular: No lower extremity edema, non tender, no erythema  Skin: Warm dry intact with no signs of infection or rash on extremities or on axial  skeleton.  Abdomen: Soft nontender  Neuro: Cranial nerves II through XII are intact, neurovascularly intact in all extremities with 2+ DTRs and 2+ pulses.  Lymph: No lymphadenopathy of posterior or anterior cervical chain or axillae bilaterally.  Gait normal with good balance and coordination.  MSK:  Non tender with full range of motion and good stability and symmetric strength and tone of shoulders, elbows, wrist, hip, knee and ankles bilaterally.  Back Exam:  Inspection: Mild loss of lordosis Motion: Flexion 45 deg, Extension 25 deg, Side Bending to 35 deg  bilaterally,  Rotation to 35 deg bilaterally  SLR laying: Negative  XSLR laying: Negative  Palpable tenderness: Tender to palpation paraspinal musculature lumbar spine right greater than left. FABER: negative. Sensory change: Gross sensation intact to all lumbar and sacral dermatomes.  Reflexes: 2+ at both patellar tendons, 2+ at achilles tendons, Babinski's downgoing.  Strength at foot  Plantar-flexion: 5/5 Dorsi-flexion: 5/5 Eversion: 5/5 Inversion: 5/5  Leg strength  Quad: 5/5 Hamstring: 5/5 Hip flexor: 5/5 Hip abductors: 5/5  Gait unremarkable.   Osteopathic findings C2 flexed rotated and side bent right C4 flexed rotated and side bent left C7 flexed rotated and side bent left T3 extended rotated and side bent right inhaled third rib T6 extended rotated and side bent left L3 flexed rotated and side bent right Sacrum right on right    Impression and Recommendations:     This case required medical decision making of moderate complexity.      Note: This dictation was prepared with Dragon dictation along with smaller phrase technology. Any transcriptional errors that result from this process are unintentional.

## 2017-03-02 ENCOUNTER — Ambulatory Visit: Payer: 59 | Admitting: Family Medicine

## 2017-03-02 ENCOUNTER — Encounter: Payer: Self-pay | Admitting: Family Medicine

## 2017-03-02 VITALS — BP 122/70 | HR 69 | Ht 71.0 in | Wt 195.0 lb

## 2017-03-02 DIAGNOSIS — M999 Biomechanical lesion, unspecified: Secondary | ICD-10-CM

## 2017-03-02 DIAGNOSIS — M5136 Other intervertebral disc degeneration, lumbar region: Secondary | ICD-10-CM | POA: Diagnosis not present

## 2017-03-02 NOTE — Assessment & Plan Note (Signed)
Decision today to treat with OMT was based on Physical Exam  After verbal consent patient was treated with HVLA, ME, FPR techniques in cervical, thoracic, lumbar and sacral areas  Patient tolerated the procedure well with improvement in symptoms  Patient given exercises, stretches and lifestyle modifications  See medications in patient instructions if given  Patient will follow up in 4-8 weeks 

## 2017-03-02 NOTE — Assessment & Plan Note (Signed)
Degenerative disc disease.  Has been doing relatively well.  Is on the gabapentin.  Has responded well to the muscle relaxer as needed.  Follow-up again in 1-2 months for more manipulation.

## 2017-03-02 NOTE — Patient Instructions (Signed)
Good to see you  Thomas Caldwell is your friend.  See me again in 1-2 months

## 2017-03-03 ENCOUNTER — Ambulatory Visit: Payer: Self-pay | Admitting: Internal Medicine

## 2017-03-19 ENCOUNTER — Ambulatory Visit: Payer: Self-pay | Admitting: Internal Medicine

## 2017-03-22 ENCOUNTER — Ambulatory Visit: Payer: 59 | Admitting: Internal Medicine

## 2017-03-22 ENCOUNTER — Encounter: Payer: Self-pay | Admitting: Internal Medicine

## 2017-03-22 VITALS — BP 118/78 | HR 49 | Temp 98.0°F | Ht 71.0 in | Wt 194.0 lb

## 2017-03-22 DIAGNOSIS — K529 Noninfective gastroenteritis and colitis, unspecified: Secondary | ICD-10-CM

## 2017-03-22 DIAGNOSIS — K219 Gastro-esophageal reflux disease without esophagitis: Secondary | ICD-10-CM | POA: Diagnosis not present

## 2017-03-22 DIAGNOSIS — R109 Unspecified abdominal pain: Secondary | ICD-10-CM | POA: Diagnosis not present

## 2017-03-22 DIAGNOSIS — I10 Essential (primary) hypertension: Secondary | ICD-10-CM | POA: Diagnosis not present

## 2017-03-22 MED ORDER — LEVOFLOXACIN 250 MG PO TABS: 250.0000 mg | ORAL_TABLET | Freq: Every day | ORAL | 0 refills | Status: AC

## 2017-03-22 NOTE — Progress Notes (Signed)
Subjective:    Patient ID: Thomas Caldwell, male    DOB: 30-Oct-1945, 72 y.o.   MRN: [id number]  HPI  Here to f/u, just back yesterday after travel earlier this month to [facility] with marked fever, n/v/d improved with cipro but may have thrown up some of it.  2 wks later had similar onset recurrent symptoms with trip to [facility], especially worse in last 4 days, and fortunately some improved today.  Has some small volume hematochezia he attributes to hemorrhoid and denies other blood or GI referral.  Diarrhea improved but still with abd fullness and dull discomfort.  No vomiting today.   Denies worsening reflux, dysphagia. Pt denies chest pain, increased sob or doe, wheezing, orthopnea, PND, increased LE swelling, palpitations, dizziness or syncope. Wt Readings from Last 3 Encounters:  03/22/17 194 lb (88 kg)  03/02/17 195 lb (88.5 kg)  01/27/17 195 lb (88.5 kg)   Past Medical History:  Diagnosis Date  . ALLERGIC RHINITIS 01/11/2008  . ANEURYSM 02/02/2007   brain  . BRADYCARDIA, CHRONIC 10/06/2007  . DIVERTICULOSIS, COLON 07/05/2007  . 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 GERD 07/05/2007  . HYPERLIPIDEMIA 02/02/2007  . HYPERTENSION 02/02/2007  . Lumbago 01/11/2008  . Peptic ulcer   . PERSISTENT DISORDER INITIATING/MAINTAINING SLEEP 02/02/2007   Past Surgical History:  Procedure Laterality Date  . APPENDECTOMY  1982  . LUMBAR DISC SURGERY  12/2005   Dr. Venetia Maxon  . SAH Left brain  1992   due to brain aneurysm s/p repair    reports that  has never smoked. he has never used smokeless tobacco. He reports that he drinks alcohol. He reports that he does not use drugs. family history includes Esophageal cancer in his brother; Heart disease in his father and sister; Hypertension in his brother and father. Allergies  Allergen Reactions  . Codeine     REACTION: confusion   Current Outpatient Medications on File Prior to Visit  Medication Sig Dispense Refill  . amLODipine (NORVASC)  10 MG tablet TAKE 1 TABLET(10 MG) BY MOUTH DAILY 90 tablet 1  . aspirin 81 MG EC tablet Take 81 mg by mouth daily.      . fluticasone (FLONASE) 50 MCG/ACT nasal spray Place 1 spray into both nostrils daily. 16 g 3  . gabapentin (NEURONTIN) 100 MG capsule Take 2 capsules (200 mg total) by mouth at bedtime. 60 capsule 3  . irbesartan (AVAPRO) 300 MG tablet Take 1 tablet (300 mg total) by mouth daily. 90 tablet 3  . metoprolol succinate (TOPROL-XL) 25 MG 24 hr tablet TAKE 1 TABLET(25 MG) BY MOUTH DAILY 90 tablet 2  . nabumetone (RELAFEN) 500 MG tablet TAKE 1 TABLET(500 MG) BY MOUTH TWICE DAILY 180 tablet 0  . naproxen (NAPROSYN) 500 MG tablet TAKE 1 TABLET BY MOUTH TWICE DAILY WITH A MEAL 60 tablet 2  . nitroGLYCERIN (NITRO-DUR) 0.1 mg/hr patch 1/4 patch daily 30 patch 12  . nitroGLYCERIN (NITRODUR - DOSED IN MG/24 HR) 0.1 mg/hr patch APPLY 1/4 PATCH DAILY 30 patch 0  . omeprazole (PRILOSEC) 20 MG capsule TAKE 2 CAPSULES BY MOUTH EVERY DAY 180 capsule 3  . rosuvastatin (CRESTOR) 20 MG tablet Take 1 tablet (20 mg total) by mouth daily. 90 tablet 3  . tiZANidine (ZANAFLEX) 4 MG tablet TAKE 1 TABLET(4 MG) BY MOUTH EVERY 6 HOURS AS NEEDED FOR MUSCLE SPASMS 40 tablet 0  . triamcinolone cream (KENALOG) 0.5 % APPLY EXTERNALLY TO THE AFFECTED AREA TWICE DAILY 30 g 0  No current facility-administered medications on file prior to visit.    Review of Systems  Constitutional: Negative for other unusual diaphoresis or sweats HENT: Negative for ear discharge or swelling Eyes: Negative for other worsening visual disturbances Respiratory: Negative for stridor or other swelling  Gastrointestinal: Negative for worsening distension or other blood Genitourinary: Negative for retention or other urinary change Musculoskeletal: Negative for other MSK pain or swelling Skin: Negative for color change or other new lesions Neurological: Negative for worsening tremors and other numbness  Psychiatric/Behavioral:  Negative for worsening agitation or other fatigue All other system neg per pt    Objective:   Physical Exam BP 118/78   Pulse (!) 49   Temp 98 F (36.7 C) (Oral)   Ht 5\' 11"  (1.803 m)   Wt 194 lb (88 kg)   SpO2 96%   BMI 27.06 kg/m  VS noted,  Constitutional: Pt appears in NAD HENT: Head: NCAT.  Right Ear: External ear normal.  Left Ear: External ear normal.  Eyes: . Pupils are equal, round, and reactive to light. Conjunctivae and EOM are normal Nose: without d/c or deformity Neck: Neck supple. Gross normal ROM Cardiovascular: Normal rate and regular rhythm.   Pulmonary/Chest: Effort normal and breath sounds without rales or wheezing.  Abd:  Soft,  ND,  BS, no organomegaly with diffuse mild tender, no guarding or rebound Neurological: Pt is alert. At baseline orientation, motor grossly intact Skin: Skin is warm. No rashes, other new lesions, no LE edema Psychiatric: Pt behavior is normal without agitation  No other exam findings    Assessment & Plan:

## 2017-03-22 NOTE — Assessment & Plan Note (Signed)
Mild to mod, for antibx course, stool studies as ordered,  to f/u any worsening symptoms or concerns

## 2017-03-22 NOTE — Patient Instructions (Signed)
Please take all new medication as prescribed - the antibiotic  Please continue all other medications as before, and refills have been done if requested.  Please have the pharmacy call with any other refills you may need.  Please keep your appointments with your specialists as you may have planned  Please go to the LAB in the Basement (turn left off the elevator) for the tests to be done today  You will be contacted by phone if any changes need to be made immediately.  Otherwise, you will receive a letter about your results with an explanation, but please check with MyChart first.  Please remember to sign up for MyChart if you have not done so, as this will be important to you in the future with finding out test results, communicating by private email, and scheduling acute appointments online when needed.   

## 2017-03-22 NOTE — Assessment & Plan Note (Signed)
stable overall by history and exam, and pt to continue medical treatment as before,  to f/u any worsening symptoms or concerns 

## 2017-03-22 NOTE — Assessment & Plan Note (Signed)
stable overall by history and exam, recent data reviewed with pt, and pt to continue medical treatment as before,  to f/u any worsening symptoms or concerns BP Readings from Last 3 Encounters:  03/22/17 118/78  03/02/17 122/70  01/27/17 100/80

## 2017-03-26 ENCOUNTER — Other Ambulatory Visit: Payer: Self-pay | Admitting: Internal Medicine

## 2017-04-14 NOTE — Progress Notes (Deleted)
[name] [address] [place], [place] [address] Phone: [phone] Subjective:     CC: Back pain follow-up  OZH:[id number]  Thomas Caldwell is a 72 y.o. male coming in with complaint of neck pain.  Has low back pain.  X-rays confirmed mild to moderate degenerative disc disease and osteoarthritic changes.  Knees were independently visualized by me.  Started on gabapentin started osteopathic manipulation.  Patient states     Past Medical History:  Diagnosis Date  . ALLERGIC RHINITIS 01/11/2008  . ANEURYSM 02/02/2007   brain  . BRADYCARDIA, CHRONIC 10/06/2007  . DIVERTICULOSIS, COLON 07/05/2007  . 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 GERD 07/05/2007  . HYPERLIPIDEMIA 02/02/2007  . HYPERTENSION 02/02/2007  . Lumbago 01/11/2008  . Peptic ulcer   . PERSISTENT DISORDER INITIATING/MAINTAINING SLEEP 02/02/2007   Past Surgical History:  Procedure Laterality Date  . APPENDECTOMY  1982  . LUMBAR DISC SURGERY  12/2005   Dr. Venetia Maxon  . SAH Left brain  1992   due to brain aneurysm s/p repair   Social History   Socioeconomic History  . Marital status: Married    Spouse name: Not on file  . Number of children: 2  . Years of education: Not on file  . Highest education level: Not on file  Social Needs  . Financial resource strain: Not on file  . Food insecurity - worry: Not on file  . Food insecurity - inability: Not on file  . Transportation needs - medical: Not on file  . Transportation needs - non-medical: Not on file  Occupational History  . Occupation: Copywriter, advertising: [employer]  Tobacco Use  . Smoking status: Never Smoker  . Smokeless tobacco: Never Used  Substance and Sexual Activity  . Alcohol use: Yes    Comment: 0-1 per day  . Drug use: No  . Sexual activity: Yes    Birth control/protection: None  Other Topics Concern  . Not on file  Social History Narrative  . Not on file   Allergies  Allergen Reactions  . Codeine      REACTION: confusion   Family History  Problem Relation Age of Onset  . Heart disease Sister   . Hypertension Father   . Heart disease Father   . Hypertension Brother        3 bother with HTN  . Esophageal cancer Brother   . Colon cancer Neg Hx   . Rectal cancer Neg Hx   . Stomach cancer Neg Hx      Past medical history, social, surgical and family history all reviewed in electronic medical record.  No pertanent information unless stated regarding to the chief complaint.   Review of Systems:Review of systems updated and as accurate as of 04/14/17  No headache, visual changes, nausea, vomiting, diarrhea, constipation, dizziness, abdominal pain, skin rash, fevers, chills, night sweats, weight loss, swollen lymph nodes, body aches, joint swelling, muscle aches, chest pain, shortness of breath, mood changes.   Objective  There were no vitals taken for this visit. Systems examined below as of 04/14/17   General: No apparent distress alert and oriented x3 mood and affect normal, dressed appropriately.  HEENT: Pupils equal, extraocular movements intact  Respiratory: Patient's speak in full sentences and does not appear short of breath  Cardiovascular: No lower extremity edema, non tender, no erythema  Skin: Warm dry intact with no signs of infection or rash on extremities or on axial skeleton.  Abdomen:  Soft nontender  Neuro: Cranial nerves II through XII are intact, neurovascularly intact in all extremities with 2+ DTRs and 2+ pulses.  Lymph: No lymphadenopathy of posterior or anterior cervical chain or axillae bilaterally.  Gait normal with good balance and coordination.  MSK:  Non tender with full range of motion and good stability and symmetric strength and tone of shoulders, elbows, wrist, hip, knee and ankles bilaterally.     Impression and Recommendations:     This case required medical decision making of moderate complexity.      Note: This dictation was prepared  with Dragon dictation along with smaller phrase technology. Any transcriptional errors that result from this process are unintentional.

## 2017-04-15 ENCOUNTER — Ambulatory Visit: Payer: 59 | Admitting: Family Medicine

## 2017-04-15 DIAGNOSIS — Z0289 Encounter for other administrative examinations: Secondary | ICD-10-CM

## 2017-04-17 ENCOUNTER — Other Ambulatory Visit: Payer: Self-pay | Admitting: Internal Medicine

## 2017-04-17 NOTE — Progress Notes (Signed)
[name] [address] [place], [place] [address] Phone: [phone] Subjective:      CC: Back pain follow-up  OZH:[id number]  Thomas Caldwell is a 72 y.o. male coming in with complaint of neck pain.  Was found to have degenerative disc disease.  Started on gabapentin 3 months ago.  Also started manipulation.  Patient states that his back has improved since last visit.       Past Medical History:  Diagnosis Date  . ALLERGIC RHINITIS 01/11/2008  . ANEURYSM 02/02/2007   brain  . BRADYCARDIA, CHRONIC 10/06/2007  . DIVERTICULOSIS, COLON 07/05/2007  . 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 GERD 07/05/2007  . HYPERLIPIDEMIA 02/02/2007  . HYPERTENSION 02/02/2007  . Lumbago 01/11/2008  . Peptic ulcer   . PERSISTENT DISORDER INITIATING/MAINTAINING SLEEP 02/02/2007   Past Surgical History:  Procedure Laterality Date  . APPENDECTOMY  1982  . LUMBAR DISC SURGERY  12/2005   Dr. Venetia Maxon  . SAH Left brain  1992   due to brain aneurysm s/p repair   Social History   Socioeconomic History  . Marital status: Married    Spouse name: Not on file  . Number of children: 2  . Years of education: Not on file  . Highest education level: Not on file  Occupational History  . 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 Financial resource strain: Not on file  . Food insecurity:    Worry: Not on file    Inability: Not on file  . Transportation needs:    Medical: Not on file    Non-medical: Not on file  Tobacco Use  . Smoking status: Never Smoker  . Smokeless tobacco: Never Used  Substance and Sexual Activity  . Alcohol use: Yes    Comment: 0-1 per day  . Drug use: No  . Sexual activity: Yes    Birth control/protection: None  Lifestyle  . Physical activity:    Days per week: Not on file    Minutes per session: Not on file  . Stress: Not on file  Relationships  . Social connections:    Talks on phone: Not on file    Gets  together: Not on file    Attends religious service: Not on file    Active member of club or organization: Not on file    Attends meetings of clubs or organizations: Not on file    Relationship status: Not on file  Other Topics Concern  . Not on file  Social History Narrative  . Not on file   Allergies  Allergen Reactions  . Codeine     REACTION: confusion   Family History  Problem Relation Age of Onset  . Heart disease Sister   . Hypertension Father   . Heart disease Father   . Hypertension Brother        3 bother with HTN  . Esophageal cancer Brother   . Colon cancer Neg Hx   . Rectal cancer Neg Hx   . Stomach cancer Neg Hx      Past medical history, social, surgical and family history all reviewed in electronic medical record.  No pertanent information unless stated regarding to the chief complaint.   Review of Systems:Review of systems updated and as accurate as of 04/19/17  No headache, visual changes, nausea, vomiting, diarrhea, constipation, dizziness, abdominal pain, skin rash, fevers, chills, night sweats, weight loss, swollen lymph nodes, body aches, joint swelling, chest  pain, shortness of breath, mood changes.  Positive muscle aches  Objective  Blood pressure 130/66, pulse (!) 59, height 5\' 11"  (1.803 m), weight 193 lb (87.5 kg), SpO2 98 %. Systems examined below as of 04/19/17   General: No apparent distress alert and oriented x3 mood and affect normal, dressed appropriately.  HEENT: Pupils equal, extraocular movements intact  Respiratory: Patient's speak in full sentences and does not appear short of breath  Cardiovascular: No lower extremity edema, non tender, no erythema  Skin: Warm dry intact with no signs of infection or rash on extremities or on axial skeleton.  Abdomen: Soft nontender  Neuro: Cranial nerves II through XII are intact, neurovascularly intact in all extremities with 2+ DTRs and 2+ pulses.  Lymph: No lymphadenopathy of posterior or anterior  cervical chain or axillae bilaterally.  Gait normal with good balance and coordination.  MSK:  Non tender with full range of motion and good stability and symmetric strength and tone of shoulders, elbows, wrist, hip, knee and ankles bilaterally.  Back exam shows significant tightness.  Mild positive Pearlean Brownie.  Negative straight leg test.  Mild pain over the right paraspinal musculature.  Osteopathic findings C2 flexed rotated and side bent right T3 extended rotated and side bent right inhaled third rib T6 extended rotated and side bent left L2 flexed rotated and side bent right Sacrum right on right     Impression and Recommendations:     This case required medical decision making of moderate complexity.      Note: This dictation was prepared with Dragon dictation along with smaller phrase technology. Any transcriptional errors that result from this process are unintentional.

## 2017-04-19 ENCOUNTER — Encounter: Payer: Self-pay | Admitting: Family Medicine

## 2017-04-19 ENCOUNTER — Ambulatory Visit: Payer: 59 | Admitting: Family Medicine

## 2017-04-19 VITALS — BP 130/66 | HR 59 | Ht 71.0 in | Wt 193.0 lb

## 2017-04-19 DIAGNOSIS — M999 Biomechanical lesion, unspecified: Secondary | ICD-10-CM

## 2017-04-19 DIAGNOSIS — M5136 Other intervertebral disc degeneration, lumbar region: Secondary | ICD-10-CM

## 2017-04-19 MED ORDER — GABAPENTIN 100 MG PO CAPS: 200.0000 mg | ORAL_CAPSULE | Freq: Every day | ORAL | 3 refills | Status: DC

## 2017-04-19 MED ORDER — VITAMIN D (ERGOCALCIFEROL) 1.25 MG (50000 UNIT) PO CAPS: 50000.0000 [IU] | ORAL_CAPSULE | ORAL | 0 refills | Status: DC

## 2017-04-19 NOTE — Assessment & Plan Note (Signed)
Remarkably doing well at this moment.  We discussed the medications again in great length.  Discussed icing regimen and home exercise.  Patient will come back and see me again in 4-6 weeks.

## 2017-04-19 NOTE — Patient Instructions (Addendum)
Good to see you  Gabapentin 200mg at night Vitamin D once week for 12 weeks.  Take the diclofenac 2 times a day for 10 days Spenco orthotics "total support" online would be great  See me again in 4-6 weeks if not better  

## 2017-04-19 NOTE — Assessment & Plan Note (Signed)
Decision today to treat with OMT was based on Physical Exam  After verbal consent patient was treated with HVLA, ME, FPR techniques in cervical, thoracic, lumbar and sacral areas  Patient tolerated the procedure well with improvement in symptoms  Patient given exercises, stretches and lifestyle modifications  See medications in patient instructions if given  Patient will follow up in 4-6 weeks 

## 2017-04-23 ENCOUNTER — Other Ambulatory Visit: Payer: Self-pay | Admitting: Internal Medicine

## 2017-04-23 ENCOUNTER — Encounter: Payer: Self-pay | Admitting: Family Medicine

## 2017-05-26 ENCOUNTER — Ambulatory Visit: Payer: 59 | Admitting: Family Medicine

## 2017-05-29 ENCOUNTER — Other Ambulatory Visit: Payer: Self-pay | Admitting: Internal Medicine

## 2017-06-29 ENCOUNTER — Other Ambulatory Visit: Payer: Self-pay | Admitting: Internal Medicine

## 2017-07-12 ENCOUNTER — Other Ambulatory Visit: Payer: Self-pay | Admitting: Family Medicine

## 2017-07-18 ENCOUNTER — Other Ambulatory Visit: Payer: Self-pay | Admitting: Internal Medicine

## 2017-08-02 ENCOUNTER — Other Ambulatory Visit: Payer: Self-pay | Admitting: Internal Medicine

## 2017-08-22 ENCOUNTER — Other Ambulatory Visit: Payer: Self-pay | Admitting: Family Medicine

## 2017-09-01 ENCOUNTER — Other Ambulatory Visit: Payer: Self-pay | Admitting: Internal Medicine

## 2017-09-27 ENCOUNTER — Other Ambulatory Visit: Payer: Self-pay | Admitting: Family Medicine

## 2017-10-03 ENCOUNTER — Other Ambulatory Visit: Payer: Self-pay | Admitting: Family Medicine

## 2017-10-09 ENCOUNTER — Other Ambulatory Visit: Payer: Self-pay | Admitting: Internal Medicine

## 2017-10-11 ENCOUNTER — Encounter: Payer: Self-pay | Admitting: Family Medicine

## 2017-10-14 ENCOUNTER — Ambulatory Visit (INDEPENDENT_AMBULATORY_CARE_PROVIDER_SITE_OTHER): Payer: 59 | Admitting: Internal Medicine

## 2017-10-14 ENCOUNTER — Encounter: Payer: Self-pay | Admitting: Internal Medicine

## 2017-10-14 ENCOUNTER — Other Ambulatory Visit (INDEPENDENT_AMBULATORY_CARE_PROVIDER_SITE_OTHER): Payer: 59

## 2017-10-14 VITALS — BP 122/84 | HR 58 | Temp 98.0°F | Ht 71.0 in | Wt 195.0 lb

## 2017-10-14 DIAGNOSIS — Z0000 Encounter for general adult medical examination without abnormal findings: Secondary | ICD-10-CM | POA: Diagnosis not present

## 2017-10-14 LAB — CBC WITH DIFFERENTIAL/PLATELET
BASOS PCT: 0.9 % (ref 0.0–3.0)
Basophils Absolute: 0.1 10*3/uL (ref 0.0–0.1)
EOS PCT: 2.3 % (ref 0.0–5.0)
Eosinophils Absolute: 0.2 10*3/uL (ref 0.0–0.7)
HEMATOCRIT: 44.1 % (ref 39.0–52.0)
HEMOGLOBIN: 14.9 g/dL (ref 13.0–17.0)
LYMPHS PCT: 27.6 % (ref 12.0–46.0)
Lymphs Abs: 2 10*3/uL (ref 0.7–4.0)
MCHC: 33.7 g/dL (ref 30.0–36.0)
MCV: 87.1 fl (ref 78.0–100.0)
MONO ABS: 0.8 10*3/uL (ref 0.1–1.0)
Monocytes Relative: 11 % (ref 3.0–12.0)
Neutro Abs: 4.2 10*3/uL (ref 1.4–7.7)
Neutrophils Relative %: 58.2 % (ref 43.0–77.0)
Platelets: 202 10*3/uL (ref 150.0–400.0)
RBC: 5.06 Mil/uL (ref 4.22–5.81)
RDW: 14.2 % (ref 11.5–15.5)
WBC: 7.2 10*3/uL (ref 4.0–10.5)

## 2017-10-14 LAB — LIPID PANEL
Cholesterol: 143 mg/dL (ref 0–200)
HDL: 47.3 mg/dL (ref >39.00)
LDL Cholesterol: 82 mg/dL (ref 0–99)
NONHDL: 96.18
Total CHOL/HDL Ratio: 3
Triglycerides: 69 mg/dL (ref 0.0–149.0)
VLDL: 13.8 mg/dL (ref 0.0–40.0)

## 2017-10-14 LAB — URINALYSIS, ROUTINE W REFLEX MICROSCOPIC
Bilirubin Urine: NEGATIVE
HGB URINE DIPSTICK: NEGATIVE
KETONES UR: NEGATIVE
LEUKOCYTES UA: NEGATIVE
NITRITE: NEGATIVE
RBC / HPF: NONE SEEN (ref 0–?)
Specific Gravity, Urine: <=1.005 — AB (ref 1.000–1.030)
Total Protein, Urine: NEGATIVE
URINE GLUCOSE: NEGATIVE
Urobilinogen, UA: 0.2 (ref 0.0–1.0)
WBC, UA: NONE SEEN (ref 0–?)
pH: 6 (ref 5.0–8.0)

## 2017-10-14 LAB — HEPATIC FUNCTION PANEL
ALT: 30 U/L (ref 0–53)
AST: 27 U/L (ref 0–37)
Albumin: 4.5 g/dL (ref 3.5–5.2)
Alkaline Phosphatase: 68 U/L (ref 39–117)
BILIRUBIN TOTAL: 0.7 mg/dL (ref 0.2–1.2)
Bilirubin, Direct: 0.1 mg/dL (ref 0.0–0.3)
Total Protein: 7.2 g/dL (ref 6.0–8.3)

## 2017-10-14 LAB — BASIC METABOLIC PANEL
BUN: 23 mg/dL (ref 6–23)
CALCIUM: 9.7 mg/dL (ref 8.4–10.5)
CO2: 28 mEq/L (ref 19–32)
CREATININE: 1.05 mg/dL (ref 0.40–1.50)
Chloride: 103 mEq/L (ref 96–112)
GFR: 73.88 mL/min (ref >60.00)
GLUCOSE: 96 mg/dL (ref 70–99)
Potassium: 4.7 mEq/L (ref 3.5–5.1)
Sodium: 138 mEq/L (ref 135–145)

## 2017-10-14 LAB — TSH: TSH: 3.6 u[IU]/mL (ref 0.35–4.50)

## 2017-10-14 LAB — PSA: PSA: 1.35 ng/mL (ref 0.10–4.00)

## 2017-10-14 NOTE — Progress Notes (Signed)
Subjective:    Patient ID: Thomas Caldwell, male    DOB: October 03, 1945, 72 y.o.   MRN: [id number]  HPI Here for wellness and f/u;  Overall doing ok;  Pt denies Chest pain, worsening SOB, DOE, wheezing, orthopnea, PND, worsening LE edema, palpitations, dizziness or syncope.  Pt denies neurological change such as new headache, facial or extremity weakness.  Pt denies polydipsia, polyuria, or low sugar symptoms. Pt states overall good compliance with treatment and medications, good tolerability, and has been trying to follow appropriate diet.  Pt denies worsening depressive symptoms, suicidal ideation or panic. No fever, night sweats, wt loss, loss of appetite, or other constitutional symptoms.  Pt states good ability with ADL's, has low fall risk, home safety reviewed and adequate, no other significant changes in hearing or vision, and only occasionally active with exercise, plays golf occasaionally.  Lost wt from 209 intentinally on keto diet.    Taking statin every day.   Wt Readings from Last 3 Encounters:  10/14/17 195 lb (88.5 kg)  04/19/17 193 lb (87.5 kg)  03/22/17 194 lb (88 kg)  Normally only 1-2 urination per night.  No new complaints except Etiology unclear, Exam otherwise benign, to check labs as documented, follow with expectant management Past Medical History:  Diagnosis Date  . ALLERGIC RHINITIS 01/11/2008  . ANEURYSM 02/02/2007   brain  . BRADYCARDIA, CHRONIC 10/06/2007  . DIVERTICULOSIS, COLON 07/05/2007  . 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 GERD 07/05/2007  . HYPERLIPIDEMIA 02/02/2007  . HYPERTENSION 02/02/2007  . Lumbago 01/11/2008  . Peptic ulcer   . PERSISTENT DISORDER INITIATING/MAINTAINING SLEEP 02/02/2007   Past Surgical History:  Procedure Laterality Date  . APPENDECTOMY  1982  . LUMBAR DISC SURGERY  12/2005   Dr. Venetia Maxon  . SAH Left brain  1992   due to brain aneurysm s/p repair    reports that he has never smoked. He has never used smokeless tobacco. He reports that  he drinks alcohol. He reports that he does not use drugs. family history includes Esophageal cancer in his brother; Heart disease in his father and sister; Hypertension in his brother and father. Allergies  Allergen Reactions  . Codeine     REACTION: confusion   Current Outpatient Medications on File Prior to Visit  Medication Sig Dispense Refill  . amLODipine (NORVASC) 10 MG tablet TAKE 1 TABLET BY MOUTH EVERY DAY 90 tablet 0  . aspirin 81 MG EC tablet Take 81 mg by mouth daily.      . fluticasone (FLONASE) 50 MCG/ACT nasal spray Place 1 spray into both nostrils daily. 16 g 3  . gabapentin (NEURONTIN) 100 MG capsule TAKE 2 CAPSULES(200 MG) BY MOUTH AT BEDTIME 60 capsule 0  . irbesartan (AVAPRO) 300 MG tablet Take 1 tablet (300 mg total) by mouth daily. 90 tablet 3  . irbesartan (AVAPRO) 300 MG tablet TAKE 1 TABLET(300 MG) BY MOUTH DAILY 90 tablet 0  . metoprolol succinate (TOPROL-XL) 25 MG 24 hr tablet TAKE 1 TABLET(25 MG) BY MOUTH DAILY 90 tablet 0  . nabumetone (RELAFEN) 500 MG tablet TAKE 1 TABLET(500 MG) BY MOUTH TWICE DAILY 180 tablet 0  . naproxen (NAPROSYN) 500 MG tablet Take 1 tablet (500 mg total) by mouth 2 (two) times daily with a meal. **APPOINTMENT NEEDED FOR ADDITIONAL REFILLS** 60 tablet 0  . nitroGLYCERIN (NITRO-DUR) 0.1 mg/hr patch 1/4 patch daily 30 patch 12  . nitroGLYCERIN (NITRODUR - DOSED IN MG/24 HR) 0.1 mg/hr patch APPLY 1/4 PATCH DAILY 30  patch 0  . omeprazole (PRILOSEC) 20 MG capsule TAKE 2 CAPSULES BY MOUTH EVERY DAY 180 capsule 0  . rosuvastatin (CRESTOR) 20 MG tablet TAKE 1 TABLET(20 MG) BY MOUTH DAILY 90 tablet 0  . tiZANidine (ZANAFLEX) 4 MG tablet TAKE 1 TABLET(4 MG) BY MOUTH EVERY 6 HOURS AS NEEDED FOR MUSCLE SPASMS 40 tablet 0  . triamcinolone cream (KENALOG) 0.5 % APPLY EXTERNALLY TO THE AFFECTED AREA TWICE DAILY 30 g 0  . Vitamin D, Ergocalciferol, (DRISDOL) 50000 units CAPS capsule TAKE 1 CAPSULE BY MOUTH EVERY 7 DAYS 12 capsule 0   No current  facility-administered medications on file prior to visit.    Review of Systems Constitutional: Negative for other unusual diaphoresis, sweats, appetite or weight changes HENT: Negative for other worsening hearing loss, ear pain, facial swelling, mouth sores or neck stiffness.   Eyes: Negative for other worsening pain, redness or other visual disturbance.  Respiratory: Negative for other stridor or swelling Cardiovascular: Negative for other palpitations or other chest pain  Gastrointestinal: Negative for worsening diarrhea or loose stools, blood in stool, distention or other pain Genitourinary: Negative for hematuria, flank pain or other change in urine volume.  Musculoskeletal: Negative for myalgias or other joint swelling.  Skin: Negative for other color change, or other wound or worsening drainage.  Neurological: Negative for other syncope or numbness. Hematological: Negative for other adenopathy or swelling Psychiatric/Behavioral: Negative for hallucinations, other worsening agitation, SI, self-injury, or new decreased concentration All other system neg per pt    Objective:   Physical Exam BP 122/84   Pulse (!) 58   Temp 98 F (36.7 C) (Oral)   Ht 5\' 11"  (1.803 m)   Wt 195 lb (88.5 kg)   SpO2 97%   BMI 27.20 kg/m  VS noted,  Constitutional: Pt is oriented to person, place, and time. Appears well-developed and well-nourished, in no significant distress and comfortable Head: Normocephalic and atraumatic  Eyes: Conjunctivae and EOM are normal. Pupils are equal, round, and reactive to light Right Ear: External ear normal without discharge Left Ear: External ear normal without discharge Nose: Nose without discharge or deformity Mouth/Throat: Oropharynx is without other ulcerations and moist  Neck: Normal range of motion. Neck supple. No JVD present. No tracheal deviation present or significant neck LA or mass Cardiovascular: Normal rate, regular rhythm, normal heart sounds and  intact distal pulses.   Pulmonary/Chest: WOB normal and breath sounds without rales or wheezing  Abdominal: Soft. Bowel sounds are normal. NT. No HSM  Musculoskeletal: Normal range of motion. Exhibits no edema Lymphadenopathy: Has no other cervical adenopathy.  Neurological: Pt is alert and oriented to person, place, and time. Pt has normal reflexes. No cranial nerve deficit. Motor grossly intact, Gait intact Skin: Skin is warm and dry. No rash noted or new ulcerations Psychiatric:  Has normal mood and affect. Behavior is normal without agitation No other exam findings Lab Results  Component Value Date   WBC 5.7 06/30/2016   HGB 15.0 06/30/2016   HCT 44.9 06/30/2016   PLT 212.0 06/30/2016   GLUCOSE 108 (H) 06/30/2016   CHOL 223 (H) 06/30/2016   TRIG 139.0 06/30/2016   HDL 45.40 06/30/2016   LDLCALC 150 (H) 06/30/2016   ALT 21 06/30/2016   AST 23 06/30/2016   NA 140 06/30/2016   K 5.0 06/30/2016   CL 106 06/30/2016   CREATININE 1.19 06/30/2016   BUN 27 (H) 06/30/2016   CO2 27 06/30/2016   TSH 4.93 (H) 06/30/2016  PSA 1.52 06/30/2016       Assessment & Plan:

## 2017-10-14 NOTE — Assessment & Plan Note (Signed)

## 2017-10-14 NOTE — Patient Instructions (Signed)

## 2017-10-19 ENCOUNTER — Encounter: Payer: Self-pay | Admitting: Internal Medicine

## 2017-10-21 ENCOUNTER — Other Ambulatory Visit: Payer: Self-pay | Admitting: Internal Medicine

## 2017-10-28 ENCOUNTER — Other Ambulatory Visit: Payer: Self-pay | Admitting: Internal Medicine

## 2017-11-05 ENCOUNTER — Ambulatory Visit: Payer: 59 | Admitting: Family Medicine

## 2017-11-26 ENCOUNTER — Other Ambulatory Visit: Payer: Self-pay | Admitting: Internal Medicine

## 2017-12-06 ENCOUNTER — Encounter: Payer: Self-pay | Admitting: Internal Medicine

## 2017-12-06 MED ORDER — AZITHROMYCIN 250 MG PO TABS: ORAL_TABLET | ORAL | 0 refills | Status: DC

## 2018-01-26 ENCOUNTER — Other Ambulatory Visit: Payer: Self-pay | Admitting: Internal Medicine

## 2018-02-23 ENCOUNTER — Encounter: Payer: Self-pay | Admitting: Internal Medicine

## 2018-02-24 MED ORDER — AZITHROMYCIN 250 MG PO TABS: ORAL_TABLET | ORAL | 0 refills | Status: DC

## 2018-03-14 ENCOUNTER — Encounter: Payer: Self-pay | Admitting: Nurse Practitioner

## 2018-03-14 ENCOUNTER — Ambulatory Visit: Payer: 59 | Admitting: Nurse Practitioner

## 2018-03-14 VITALS — BP 120/80 | HR 60 | Temp 97.9°F | Ht 71.0 in | Wt 201.0 lb

## 2018-03-14 DIAGNOSIS — H6690 Otitis media, unspecified, unspecified ear: Secondary | ICD-10-CM | POA: Diagnosis not present

## 2018-03-14 MED ORDER — AMOXICILLIN-POT CLAVULANATE 875-125 MG PO TABS: 1.0000 | ORAL_TABLET | Freq: Two times a day (BID) | ORAL | 0 refills | Status: DC

## 2018-03-14 NOTE — Progress Notes (Signed)
Thomas Caldwell is a 73 y.o. male with the following history as recorded in [employer]:  Patient Active Problem List   Diagnosis Date Noted  . Nonallopathic lesion of sacral region 01/27/2017  . Nonallopathic lesion of thoracic region 01/27/2017  . Nonallopathic lesion of lumbosacral region 01/27/2017  . Degenerative disc disease, lumbar 12/29/2016  . External otitis of right ear 06/09/2016  . Skin lesion 07/11/2015  . Right Achilles tendinitis 07/05/2014  . Unspecified constipation 10/01/2013  . Vertigo 08/01/2013  . Lateral epicondylitis  of elbow 07/07/2013  . Other dysphagia 06/29/2013  . Right elbow pain 06/29/2013  . Posterior tibial tendinitis of right leg 03/22/2013  . Gastroenteritis presumed infectious 02/13/2013  . Right knee pain 11/08/2012  . Closed fracture of left distal radius 10/25/2012  . Renal insufficiency 06/24/2012  . Abnormal TSH 06/24/2012  . Recurrent epistaxis 01/19/2012  . Fatigue 09/07/2011  . Back pain 06/02/2011  . Preventative health care 06/09/2010  . NECK PAIN 03/27/2009  . Allergic rhinitis 01/11/2008  . Lumbago 01/11/2008  . Chronic sinus bradycardia 10/06/2007  . GERD 07/05/2007  . Diverticulosis of colon 07/05/2007  . Hyperlipidemia 02/02/2007  . 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 Essential hypertension 02/02/2007  . ANEURYSM 02/02/2007    Current Outpatient Medications  Medication Sig Dispense Refill  . amLODipine (NORVASC) 10 MG tablet TAKE 1 TABLET BY MOUTH EVERY DAY 90 tablet 1  . aspirin 81 MG EC tablet Take 81 mg by mouth daily.      Marland Kitchen azithromycin (ZITHROMAX Z-PAK) 250 MG tablet 2 tab by mouth day 1, then 1 per day 6 tablet 0  . fluticasone (FLONASE) 50 MCG/ACT nasal spray Place 1 spray into both nostrils daily. 16 g 3  . gabapentin (NEURONTIN) 100 MG capsule TAKE 2 CAPSULES(200 MG) BY MOUTH AT BEDTIME 60 capsule 0  . irbesartan (AVAPRO) 300 MG tablet TAKE 1 TABLET(300 MG) BY MOUTH DAILY 90 tablet 0   . irbesartan (AVAPRO) 300 MG tablet TAKE 1 TABLET(300 MG) BY MOUTH DAILY 90 tablet 2  . metoprolol succinate (TOPROL-XL) 25 MG 24 hr tablet TAKE 1 TABLET(25 MG) BY MOUTH DAILY 90 tablet 2  . nabumetone (RELAFEN) 500 MG tablet TAKE 1 TABLET(500 MG) BY MOUTH TWICE DAILY 180 tablet 0  . naproxen (NAPROSYN) 500 MG tablet Take 1 tablet (500 mg total) by mouth 2 (two) times daily with a meal. **APPOINTMENT NEEDED FOR ADDITIONAL REFILLS** 60 tablet 0  . nitroGLYCERIN (NITRO-DUR) 0.1 mg/hr patch 1/4 patch daily 30 patch 12  . nitroGLYCERIN (NITRODUR - DOSED IN MG/24 HR) 0.1 mg/hr patch APPLY 1/4 PATCH DAILY 30 patch 0  . omeprazole (PRILOSEC) 20 MG capsule TAKE 2 CAPSULES BY MOUTH EVERY DAY 180 capsule 0  . rosuvastatin (CRESTOR) 20 MG tablet TAKE 1 TABLET(20 MG) BY MOUTH DAILY 90 tablet 2  . tiZANidine (ZANAFLEX) 4 MG tablet TAKE 1 TABLET(4 MG) BY MOUTH EVERY 6 HOURS AS NEEDED FOR MUSCLE SPASMS 40 tablet 0  . triamcinolone cream (KENALOG) 0.5 % APPLY EXTERNALLY TO THE AFFECTED AREA TWICE DAILY 30 g 0  . Vitamin D, Ergocalciferol, (DRISDOL) 50000 units CAPS capsule TAKE 1 CAPSULE BY MOUTH EVERY 7 DAYS 12 capsule 0  . amoxicillin-clavulanate (AUGMENTIN) 875-125 MG tablet Take 1 tablet by mouth 2 (two) times daily. 14 tablet 0   No current facility-administered medications for this visit.     Allergies: Codeine  Past Medical History:  Diagnosis Date  . ALLERGIC RHINITIS 01/11/2008  . ANEURYSM 02/02/2007   brain  .  BRADYCARDIA, CHRONIC 10/06/2007  . DIVERTICULOSIS, COLON 07/05/2007  . 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 GERD 07/05/2007  . HYPERLIPIDEMIA 02/02/2007  . HYPERTENSION 02/02/2007  . Lumbago 01/11/2008  . Peptic ulcer   . PERSISTENT DISORDER INITIATING/MAINTAINING SLEEP 02/02/2007    Past Surgical History:  Procedure Laterality Date  . APPENDECTOMY  1982  . LUMBAR DISC SURGERY  12/2005   Dr. Venetia Maxon  . SAH Left brain  1992   due to brain aneurysm s/p repair    Family History  Problem  Relation Age of Onset  . Heart disease Sister   . Hypertension Father   . Heart disease Father   . Hypertension Brother        3 bother with HTN  . Esophageal cancer Brother   . Colon cancer Neg Hx   . Rectal cancer Neg Hx   . Stomach cancer Neg Hx     Social History   Tobacco Use  . Smoking status: Never Smoker  . Smokeless tobacco: Never Used  Substance Use Topics  . Alcohol use: Yes    Comment: 0-1 per day     Subjective:   Thomas Caldwell is here today for evaluation of acute c/o ear/sinus symptoms, which first began about 1 week ago after returning from trip to [place] He reports history of recurrent ear/sinus infections, especially after a flight, his symptoms feel the same this time. He was actually treated with zpak course for similar symptoms after a flight to [place] in January, with full resolution of symptoms. He reports headache, bilateral ear pain/pressure, sinus pain and pressure, sore throat, symptoms seeming to get worse since onset. Denies fevers, chills, dizziness, confusion, sob, cp, cough, abdominal pain, n/v/d, rash Tried at home? nothing Sick contacts: none that he's aware of Has seen ENT for this many years ago, not currently following with  ROS- See HPI   Objective:  Vitals:   03/14/18 1033  BP: 120/80  Pulse: 60  Temp: 97.9 F (36.6 C)  TempSrc: Oral  SpO2: 96%  Weight: 201 lb (91.2 kg)  Height: 5\' 11"  (1.803 m)    General: Well developed, well nourished, in no acute distress  Skin : Warm and dry.  Head: Normocephalic and atraumatic  Eyes: Sclera and conjunctiva clear; pupils round and reactive to light; extraocular movements intact  Ears: External normal; canals clear; tympanic membranes bulging, erythematous bilaterally without effusion Nose: Maxillary sinus tenderness present. No frontal sinus tenderness Oropharynx: Pink, supple. No suspicious lesions  Neck: Supple without thyromegaly, adenopathy  Lungs: Respirations unlabored;  clear to auscultation bilaterally without wheeze, rales, rhonchi  CVS exam: normal rate and regular rhythm, S1 and S2 normal.   Extremities: No edema, cyanosis, clubbing  Vessels: Symmetric bilaterally  Neurologic: Alert and oriented; speech intact; face symmetrical; moves all extremities well; CNII-XII intact without focal deficit  Psychiatric: Normal mood and affect.   Assessment:  1. Acute otitis media, unspecified otitis media type     Plan:   Due to duration, worsening symptoms and PE findings, will prescribe augmentin course- medication dosing, side effects discussed Home management, red flags and return precautions including when to seek immediate care discussed and printed on AVS He will f/u for new, worsening symptoms, or if symptoms persist after abx course  No follow-ups on file.  No orders of the defined types were placed in this encounter.   Requested Prescriptions   Signed Prescriptions Disp Refills  . amoxicillin-clavulanate (AUGMENTIN) 875-125 MG tablet 14 tablet 0  Sig: Take 1 tablet by mouth 2 (two) times daily.   '

## 2018-03-14 NOTE — Patient Instructions (Addendum)
Complete augmentin as prescribed  Please follow up for fevers over 101, if your symptoms get worse, or if your symptoms dont improve with the antibiotic.   Otitis Media, Adult  Otitis media means that the middle ear is red and swollen (inflamed) and full of fluid. The condition usually goes away on its own. Follow these instructions at home:  Take over-the-counter and prescription medicines only as told by your doctor.  If you were prescribed an antibiotic medicine, take it as told by your doctor. Do not stop taking the antibiotic even if you start to feel better.  Keep all follow-up visits as told by your doctor. This is important. Contact a doctor if:  You have bleeding from your nose.  There is a lump on your neck.  You are not getting better in 5 days.  You feel worse instead of better. Get help right away if:  You have pain that is not helped with medicine.  You have swelling, redness, or pain around your ear.  You get a stiff neck.  You cannot move part of your face (paralyzed).  You notice that the bone behind your ear hurts when you touch it.  You get a very bad headache. Summary  Otitis media means that the middle ear is red, swollen, and full of fluid.  This condition usually goes away on its own. In some cases, treatment may be needed.  If you were prescribed an antibiotic medicine, take it as told by your doctor. This information is not intended to replace advice given to you by your health care provider. Make sure you discuss any questions you have with your health care provider. Document Released: 07/01/2007 Document Revised: 02/03/2016 Document Reviewed: 02/03/2016 Elsevier Interactive Patient Education  2019 Else[employer]   Sinusitis, Adult Sinusitis is soreness and swelling (inflammation) of your sinuses. Sinuses are hollow spaces in the bones around your face. They are located:  Around your eyes.  In the middle of your forehead.  Behind your  nose.  In your cheekbones. Your sinuses and nasal passages are lined with a fluid called mucus. Mucus drains out of your sinuses. Swelling can trap mucus in your sinuses. This lets germs (bacteria, virus, or fungus) grow, which leads to infection. Most of the time, this condition is caused by a virus. What are the causes? This condition is caused by:  Allergies.  Asthma.  Germs.  Things that block your nose or sinuses.  Growths in the nose (nasal polyps).  Chemicals or irritants in the air.  Fungus (rare). What increases the risk? You are more likely to develop this condition if:  You have a weak body defense system (immune system).  You do a lot of swimming or diving.  You use nasal sprays too much.  You smoke. What are the signs or symptoms? The main symptoms of this condition are pain and a feeling of pressure around the sinuses. Other symptoms include:  Stuffy nose (congestion).  Runny nose (drainage).  Swelling and warmth in the sinuses.  Headache.  Toothache.  A cough that may get worse at night.  Mucus that collects in the throat or the back of the nose (postnasal drip).  Being unable to smell and taste.  Being very tired (fatigue).  A fever.  Sore throat.  Bad breath. How is this diagnosed? This condition is diagnosed based on:  Your symptoms.  Your medical history.  A physical exam.  Tests to find out if your condition is short-term (acute)  or long-term (chronic). Your doctor may: ? Check your nose for growths (polyps). ? Check your sinuses using a tool that has a light (endoscope). ? Check for allergies or germs. ? Do imaging tests, such as an MRI or CT scan. How is this treated? Treatment for this condition depends on the cause and whether it is short-term or long-term.  If caused by a virus, your symptoms should go away on their own within 10 days. You may be given medicines to relieve symptoms. They include: ? Medicines that  shrink swollen tissue in the nose. ? Medicines that treat allergies (antihistamines). ? A spray that treats swelling of the nostrils. ? Rinses that help get rid of thick mucus in your nose (nasal saline washes).  If caused by bacteria, your doctor may wait to see if you will get better without treatment. You may be given antibiotic medicine if you have: ? A very bad infection. ? A weak body defense system.  If caused by growths in the nose, you may need to have surgery. Follow these instructions at home: Medicines  Take, use, or apply over-the-counter and prescription medicines only as told by your doctor. These may include nasal sprays.  If you were prescribed an antibiotic medicine, take it as told by your doctor. Do not stop taking the antibiotic even if you start to feel better. Hydrate and humidify   Drink enough water to keep your pee (urine) pale yellow.  Use a cool mist humidifier to keep the humidity level in your home above 50%.  Breathe in steam for 10-15 minutes, 3-4 times a day, or as told by your doctor. You can do this in the bathroom while a hot shower is running.  Try not to spend time in cool or dry air. Rest  Rest as much as you can.  Sleep with your head raised (elevated).  Make sure you get enough sleep each night. General instructions   Put a warm, moist washcloth on your face 3-4 times a day, or as often as told by your doctor. This will help with discomfort.  Wash your hands often with soap and water. If there is no soap and water, use hand sanitizer.  Do not smoke. Avoid being around people who are smoking (secondhand smoke).  Keep all follow-up visits as told by your doctor. This is important. Contact a doctor if:  You have a fever.  Your symptoms get worse.  Your symptoms do not get better within 10 days. Get help right away if:  You have a very bad headache.  You cannot stop throwing up (vomiting).  You have very bad pain or  swelling around your face or eyes.  You have trouble seeing.  You feel confused.  Your neck is stiff.  You have trouble breathing. Summary  Sinusitis is swelling of your sinuses. Sinuses are hollow spaces in the bones around your face.  This condition is caused by tissues in your nose that become inflamed or swollen. This traps germs. These can lead to infection.  If you were prescribed an antibiotic medicine, take it as told by your doctor. Do not stop taking it even if you start to feel better.  Keep all follow-up visits as told by your doctor. This is important. This information is not intended to replace advice given to you by your health care provider. Make sure you discuss any questions you have with your health care provider. Document Released: 07/01/2007 Document Revised: 06/14/2017 Document Reviewed: 06/14/2017 Elsevier  Interactive Patient Education  [employer].

## 2018-03-16 NOTE — Progress Notes (Signed)
[name] [address] [place], [place] [address] Phone: [phone] Subjective:   [name], am serving as a scribe for Dr. Antoine Primas.  CC: Back pain follow-up  IRC:[id number]  Thomas Caldwell is a 72 y.o. male coming in with complaint of back pain. He has continued lower back pain since we last saw him. Denies any radiating symptoms. Has been playing golf quite a bit.  Known mild to moderate arthritic changes.  Has responded fairly well to osteopathic manipulation.  Patient states still able to play golf but at whole 15 starts having discomfort.  No radiation down the legs  Achilles pain is still present. Is improved with a heel lift. Patient is unable to walk without shoes on as this increases his pain as well.    Past Medical History:  Diagnosis Date  . ALLERGIC RHINITIS 01/11/2008  . ANEURYSM 02/02/2007   brain  . BRADYCARDIA, CHRONIC 10/06/2007  . DIVERTICULOSIS, COLON 07/05/2007  . 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 GERD 07/05/2007  . HYPERLIPIDEMIA 02/02/2007  . HYPERTENSION 02/02/2007  . Lumbago 01/11/2008  . Peptic ulcer   . PERSISTENT DISORDER INITIATING/MAINTAINING SLEEP 02/02/2007   Past Surgical History:  Procedure Laterality Date  . APPENDECTOMY  1982  . LUMBAR DISC SURGERY  12/2005   Dr. Venetia Maxon  . SAH Left brain  1992   due to brain aneurysm s/p repair   Social History   Socioeconomic History  . Marital status: Married    Spouse name: Not on file  . Number of children: 2  . Years of education: Not on file  . Highest education level: Not on file  Occupational History  . 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 Financial resource strain: Not on file  . Food insecurity:    Worry: Not on file    Inability: Not on file  . Transportation needs:    Medical: Not on file    Non-medical: Not on file  Tobacco Use  . Smoking status: Never Smoker  . Smokeless tobacco: Never Used    Substance and Sexual Activity  . Alcohol use: Yes    Comment: 0-1 per day  . Drug use: No  . Sexual activity: Yes    Birth control/protection: None  Lifestyle  . Physical activity:    Days per week: Not on file    Minutes per session: Not on file  . Stress: Not on file  Relationships  . Social connections:    Talks on phone: Not on file    Gets together: Not on file    Attends religious service: Not on file    Active member of club or organization: Not on file    Attends meetings of clubs or organizations: Not on file    Relationship status: Not on file  Other Topics Concern  . Not on file  Social History Narrative  . Not on file   Allergies  Allergen Reactions  . Codeine     REACTION: confusion   Family History  Problem Relation Age of Onset  . Heart disease Sister   . Hypertension Father   . Heart disease Father   . Hypertension Brother        3 bother with HTN  . Esophageal cancer Brother   . Colon cancer Neg Hx   . Rectal cancer Neg Hx   . Stomach cancer Neg Hx      Current Outpatient Medications (Cardiovascular):  .  amLODipine (NORVASC) 10 MG tablet, TAKE 1 TABLET BY MOUTH EVERY DAY .  irbesartan (AVAPRO) 300 MG tablet, TAKE 1 TABLET(300 MG) BY MOUTH DAILY .  irbesartan (AVAPRO) 300 MG tablet, TAKE 1 TABLET(300 MG) BY MOUTH DAILY .  metoprolol succinate (TOPROL-XL) 25 MG 24 hr tablet, TAKE 1 TABLET(25 MG) BY MOUTH DAILY .  nitroGLYCERIN (NITRO-DUR) 0.1 mg/hr patch, 1/4 patch daily .  nitroGLYCERIN (NITRODUR - DOSED IN MG/24 HR) 0.1 mg/hr patch, APPLY 1/4 PATCH DAILY .  rosuvastatin (CRESTOR) 20 MG tablet, TAKE 1 TABLET(20 MG) BY MOUTH DAILY  Current Outpatient Medications (Respiratory):  .  fluticasone (FLONASE) 50 MCG/ACT nasal spray, Place 1 spray into both nostrils daily.  Current Outpatient Medications (Analgesics):  .  aspirin 81 MG EC tablet, Take 81 mg by mouth daily.   .  nabumetone (RELAFEN) 500 MG tablet, TAKE 1 TABLET(500 MG) BY MOUTH TWICE  DAILY .  naproxen (NAPROSYN) 500 MG tablet, Take 1 tablet (500 mg total) by mouth 2 (two) times daily with a meal. **APPOINTMENT NEEDED FOR ADDITIONAL REFILLS**   Current Outpatient Medications (Other):  .  amoxicillin-clavulanate (AUGMENTIN) 875-125 MG tablet, Take 1 tablet by mouth 2 (two) times daily. Marland Kitchen  azithromycin (ZITHROMAX Z-PAK) 250 MG tablet, 2 tab by mouth day 1, then 1 per day .  gabapentin (NEURONTIN) 100 MG capsule, TAKE 2 CAPSULES(200 MG) BY MOUTH AT BEDTIME .  omeprazole (PRILOSEC) 20 MG capsule, TAKE 2 CAPSULES BY MOUTH EVERY DAY .  tiZANidine (ZANAFLEX) 4 MG tablet, TAKE 1 TABLET(4 MG) BY MOUTH EVERY 6 HOURS AS NEEDED FOR MUSCLE SPASMS .  triamcinolone cream (KENALOG) 0.5 %, APPLY EXTERNALLY TO THE AFFECTED AREA TWICE DAILY .  Vitamin D, Ergocalciferol, (DRISDOL) 50000 units CAPS capsule, TAKE 1 CAPSULE BY MOUTH EVERY 7 DAYS    Past medical history, social, surgical and family history all reviewed in electronic medical record.  No pertanent information unless stated regarding to the chief complaint.   Review of Systems:  No headache, visual changes, nausea, vomiting, diarrhea, constipation, dizziness, abdominal pain, skin rash, fevers, chills, night sweats, weight loss, swollen lymph nodes, body aches, joint swelling, muscle aches, chest pain, shortness of breath, mood changes.   Objective  Blood pressure 122/88, pulse 65, height 5\' 11"  (1.803 m), weight 201 lb (91.2 kg), SpO2 97 %. Systems examined below as of    General: No apparent distress alert and oriented x3 mood and affect normal, dressed appropriately.  HEENT: Pupils equal, extraocular movements intact  Respiratory: Patient's speak in full sentences and does not appear short of breath  Cardiovascular: No lower extremity edema, non tender, no erythema  Skin: Warm dry intact with no signs of infection or rash on extremities or on axial skeleton.  Abdomen: Soft nontender  Neuro: Cranial nerves II through XII are  intact, neurovascularly intact in all extremities with 2+ DTRs and 2+ pulses.  Lymph: No lymphadenopathy of posterior or anterior cervical chain or axillae bilaterally.  Gait normal with good balance and coordination.  MSK:  Non tender with full range of motion and good stability and symmetric strength and tone of shoulders, elbows, wrist, hip, knee and ankles bilaterally.  Back Exam:  Inspection: Loss of lordosis Motion: Flexion 40 deg, Extension 25 deg, Side Bending to 35 deg bilaterally,  Rotation to 35 deg bilaterally  SLR laying: Negative  XSLR laying: Negative  Palpable tenderness: .  Diffuse tenderness to palpation of the paraspinal musculature lumbar spine. FABER: negative. Sensory change: Gross sensation intact to all  lumbar and sacral dermatomes.  Reflexes: 2+ at both patellar tendons, 2+ at achilles tendons, Babinski's downgoing.  Strength at foot  Plantar-flexion: 5/5 Dorsi-flexion: 5/5 Eversion: 5/5 Inversion: 5/5  Leg strength  Quad: 5/5 Hamstring: 5/5 Hip flexor: 5/5 Hip abductors: 5/5  Gait unremarkable.  Osteopathic findings  T7 extended rotated and side bent left L2 flexed rotated and side bent right Sacrum right on right     Impression and Recommendations:     This case required medical decision making of moderate complexity. The above documentation has been reviewed and is accurate and complete Judi Saa, DO       Note: This dictation was prepared with Dragon dictation along with smaller phrase technology. Any transcriptional errors that result from this process are unintentional.

## 2018-03-17 ENCOUNTER — Ambulatory Visit: Payer: 59 | Admitting: Family Medicine

## 2018-03-17 VITALS — BP 122/88 | HR 65 | Ht 71.0 in | Wt 201.0 lb

## 2018-03-17 DIAGNOSIS — M999 Biomechanical lesion, unspecified: Secondary | ICD-10-CM | POA: Diagnosis not present

## 2018-03-17 DIAGNOSIS — M545 Low back pain, unspecified: Secondary | ICD-10-CM

## 2018-03-17 NOTE — Assessment & Plan Note (Signed)
Decision today to treat with OMT was based on Physical Exam  After verbal consent patient was treated with HVLA, ME, FPR techniques in  thoracic, lumbar and sacral areas  Patient tolerated the procedure well with improvement in symptoms  Patient given exercises, stretches and lifestyle modifications  See medications in patient instructions if given  Patient will follow up in 4 weeks 

## 2018-03-17 NOTE — Assessment & Plan Note (Signed)
Back pain overall.  Patient does have mild to moderate arthritic changes.  We discussed the advanced imaging.  Patient does not want to do this.  Has a history of a surgical intervention previously.  Continue the gabapentin given long-acting.  And attempted osteopathic manipulation.  Follow-up again in 4 weeks

## 2018-03-17 NOTE — Patient Instructions (Signed)
Good to see you  Try the horizant 300mg  at night and write me in a week  See me again in 4 weeks

## 2018-03-18 ENCOUNTER — Other Ambulatory Visit: Payer: Self-pay | Admitting: Internal Medicine

## 2018-03-29 ENCOUNTER — Encounter: Payer: Self-pay | Admitting: Family Medicine

## 2018-03-29 ENCOUNTER — Other Ambulatory Visit: Payer: Self-pay

## 2018-03-29 MED ORDER — GABAPENTIN ENACARBIL ER 300 MG PO TBCR: 300.0000 mg | EXTENDED_RELEASE_TABLET | Freq: Every day | ORAL | 3 refills | Status: DC

## 2018-03-29 NOTE — Progress Notes (Unsigned)
RALO

## 2018-04-13 ENCOUNTER — Encounter: Payer: Self-pay | Admitting: Family Medicine

## 2018-04-14 ENCOUNTER — Telehealth: Payer: Self-pay

## 2018-04-14 NOTE — Telephone Encounter (Signed)
Called patient and scheduled him for Monday appointment. SI joint injection for hip and leg pain. Originally instructed by Dr. Katrinka Blazing to order an epidural for his back however patient has never had one. SI joint injection was decided instead.

## 2018-04-17 NOTE — Progress Notes (Signed)
[name] [address] [place], [place] [address] Phone: [phone] Subjective:   Thomas Caldwell, am serving as a scribe for Dr. Antoine Primas.    CC: Back pain follow-up  [id number]  Thomas Caldwell is a 73 y.o. male coming in with complaint of back pain. Patient has constant pain in right hip. Would like an injection today.    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  Rates the severity pain is intermittent.  Will get him up at night.  Attempted to play golf this weekend with moderate discomfort  Past Medical History:  Diagnosis Date  . ALLERGIC RHINITIS 01/11/2008  . ANEURYSM 02/02/2007   brain  . BRADYCARDIA, CHRONIC 10/06/2007  . DIVERTICULOSIS, COLON 07/05/2007  . 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 GERD 07/05/2007  . HYPERLIPIDEMIA 02/02/2007  . HYPERTENSION 02/02/2007  . Lumbago 01/11/2008  . Peptic ulcer   . PERSISTENT DISORDER INITIATING/MAINTAINING SLEEP 02/02/2007   Past Surgical History:  Procedure Laterality Date  . APPENDECTOMY  1982  . LUMBAR DISC SURGERY  12/2005   Dr. Venetia Maxon  . SAH Left brain  1992   due to brain aneurysm s/p repair   Social History   Socioeconomic History  . Marital status: Married    Spouse name: Not on file  . Number of children: 2  . Years of education: Not on file  . Highest education level: Not on file  Occupational History  . 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 Financial resource strain: Not on file  . Food insecurity:    Worry: Not on file    Inability: Not on file  . Transportation needs:    Medical: Not on file    Non-medical: Not on file  Tobacco Use  . Smoking  status: Never Smoker  . Smokeless tobacco: Never Used  Substance and Sexual Activity  . Alcohol use: Yes    Comment: 0-1 per day  . Drug use: No  . Sexual activity: Yes    Birth control/protection: None  Lifestyle  . Physical activity:    Days per week: Not on file    Minutes per session: Not on file  . Stress: Not on file  Relationships  . Social connections:    Talks on phone: Not on file    Gets together: Not on file    Attends religious service: Not on file    Active member of club or organization: Not on file    Attends meetings of clubs or organizations: Not on file    Relationship status: Not on file  Other Topics Concern  . Not on file  Social History Narrative  . Not on file   Allergies  Allergen Reactions  . Codeine     REACTION: confusion   Family History  Problem Relation Age of Onset  . Heart disease Sister   . Hypertension Father   . Heart disease Father   . Hypertension Brother        3 bother with HTN  . Esophageal cancer Brother   . Colon cancer Neg Hx   . Rectal cancer Neg Hx   . Stomach cancer Neg Hx  Current Outpatient Medications (Cardiovascular):  .  amLODipine (NORVASC) 10 MG tablet, TAKE 1 TABLET BY MOUTH EVERY DAY .  irbesartan (AVAPRO) 300 MG tablet, TAKE 1 TABLET(300 MG) BY MOUTH DAILY .  irbesartan (AVAPRO) 300 MG tablet, TAKE 1 TABLET(300 MG) BY MOUTH DAILY .  metoprolol succinate (TOPROL-XL) 25 MG 24 hr tablet, TAKE 1 TABLET(25 MG) BY MOUTH DAILY .  nitroGLYCERIN (NITRO-DUR) 0.1 mg/hr patch, 1/4 patch daily .  nitroGLYCERIN (NITRODUR - DOSED IN MG/24 HR) 0.1 mg/hr patch, APPLY 1/4 PATCH DAILY .  rosuvastatin (CRESTOR) 20 MG tablet, TAKE 1 TABLET(20 MG) BY MOUTH DAILY  Current Outpatient Medications (Respiratory):  .  fluticasone (FLONASE) 50 MCG/ACT nasal spray, Place 1 spray into both nostrils daily.  Current Outpatient Medications (Analgesics):  .  aspirin 81 MG EC tablet, Take 81 mg by mouth daily.   .  nabumetone  (RELAFEN) 500 MG tablet, TAKE 1 TABLET(500 MG) BY MOUTH TWICE DAILY .  naproxen (NAPROSYN) 500 MG tablet, Take 1 tablet (500 mg total) by mouth 2 (two) times daily with a meal. **APPOINTMENT NEEDED FOR ADDITIONAL REFILLS**   Current Outpatient Medications (Other):  .  amoxicillin-clavulanate (AUGMENTIN) 875-125 MG tablet, Take 1 tablet by mouth 2 (two) times daily. Marland Kitchen  azithromycin (ZITHROMAX Z-PAK) 250 MG tablet, 2 tab by mouth day 1, then 1 per day .  gabapentin (NEURONTIN) 100 MG capsule, TAKE 2 CAPSULES(200 MG) BY MOUTH AT BEDTIME .  Gabapentin Enacarbil ER (HORIZANT) 300 MG TBCR, Take 300 mg by mouth at bedtime. Marland Kitchen  omeprazole (PRILOSEC) 20 MG capsule, TAKE 2 CAPSULES BY MOUTH EVERY DAY .  tiZANidine (ZANAFLEX) 4 MG tablet, TAKE 1 TABLET(4 MG) BY MOUTH EVERY 6 HOURS AS NEEDED FOR MUSCLE SPASMS .  triamcinolone cream (KENALOG) 0.5 %, APPLY EXTERNALLY TO THE AFFECTED AREA TWICE DAILY .  Vitamin D, Ergocalciferol, (DRISDOL) 50000 units CAPS capsule, TAKE 1 CAPSULE BY MOUTH EVERY 7 DAYS    Past medical history, social, surgical and family history all reviewed in electronic medical record.  No pertanent information unless stated regarding to the chief complaint.   Review of Systems:  No headache, visual changes, nausea, vomiting, diarrhea, constipation, dizziness, abdominal pain, skin rash, fevers, chills, night sweats, weight loss, swollen lymph nodes, body aches, joint swelling, muscle aches, chest pain, shortness of breath, mood changes.   Objective  There were no vitals taken for this visit. Systems examined below as of    General: No apparent distress alert and oriented x3 mood and affect normal, dressed appropriately.  HEENT: Pupils equal, extraocular movements intact  Respiratory: Patient's speak in full sentences and does not appear short of breath  Cardiovascular: No lower extremity edema, non tender, no erythema  Skin: Warm dry intact with no signs of infection or rash on  extremities or on axial skeleton.  Abdomen: Soft nontender  Neuro: Cranial nerves II through XII are intact, neurovascularly intact in all extremities with 2+ DTRs and 2+ pulses.  Lymph: No lymphadenopathy of posterior or anterior cervical chain or axillae bilaterally.  Gait normal with good balance and coordination.  MSK:  Non tender with full range of motion and good stability and symmetric strength and tone of shoulders, elbows, wrist, hip, knee and ankles bilaterally.  Back Exam:  Inspection: Mild loss of lordosis Motion: Flexion 45 deg, Extension 15 deg, Side Bending to 30 deg bilaterally,  Rotation to 25 deg bilaterally  SLR laying: Negative  XSLR laying: Negative  Palpable tenderness: Tender over the right sacroiliac joint.Marland Kitchen FABER:  Positive on the right side with pain over the greater trochanteric area as well as the sacroiliac joint. Sensory change: Gross sensation intact to all lumbar and sacral dermatomes.  Reflexes: 2+ at both patellar tendons, 2+ at achilles tendons, Babinski's downgoing.  Strength at foot  Plantar-flexion: 5/5 Dorsi-flexion: 5/5 Eversion: 5/5 Inversion: 5/5  Leg strength  Quad: 5/5 Hamstring: 5/5 Hip flexor: 5/5 Hip abductors: 5/5  Gait unremarkable.  Procedure: Real-time Ultrasound Guided Injection of sacroiliac joint Device: GE Logiq Q7 Ultrasound guided injection is preferred based studies that show increased duration, increased effect, greater accuracy, decreased procedural pain, increased response rate, and decreased cost with ultrasound guided versus blind injection.  Verbal informed consent obtained.  Time-out conducted.  Noted no overlying erythema, induration, or other signs of local infection.  Skin prepped in a sterile fashion.  Local anesthesia: Topical Ethyl chloride.  With sterile technique and under real time ultrasound guidance: With a 21-gauge 3 inch needle injected in the right sacroiliac joint with a total of 1 cc of 0.5% Marcaine and 1  cc of Kenalog 40 mg/mL Completed without difficulty  Pain immediately resolved suggesting accurate placement of the medication.  Advised to call if fevers/chills, erythema, induration, drainage, or persistent bleeding.  Images permanently stored and available for review in the ultrasound unit.  Impression: Technically successful ultrasound guided injection.   Procedure: Real-time Ultrasound Guided Injection of right greater trochanteric bursitis secondary to patient's body habitus Device: GE Logiq Q7 Ultrasound guided injection is preferred based studies that show increased duration, increased effect, greater accuracy, decreased procedural pain, increased response rate, and decreased cost with ultrasound guided versus blind injection.  Verbal informed consent obtained.  Time-out conducted.  Noted no overlying erythema, induration, or other signs of local infection.  Skin prepped in a sterile fashion.  Local anesthesia: Topical Ethyl chloride.  With sterile technique and under real time ultrasound guidance:  Greater trochanteric area was visualized and patient's bursa was noted. A 22-gauge 3 inch needle was inserted and 4 cc of 0.5% Marcaine and 1 cc of Kenalog 40 mg/dL was injected. Pictures taken Completed without difficulty  Pain immediately resolved suggesting accurate placement of the medication.  Advised to call if fevers/chills, erythema, induration, drainage, or persistent bleeding.  Images permanently stored and available for review in the ultrasound unit.  Impression: Technically successful ultrasound guided injection.   Impression and Recommendations:     This case required medical decision making of moderate complexity. The above documentation has been reviewed and is accurate and complete Judi Saa, DO       Note: This dictation was prepared with Dragon dictation along with smaller phrase technology. Any transcriptional errors that result from this process are  unintentional.

## 2018-04-18 ENCOUNTER — Ambulatory Visit (INDEPENDENT_AMBULATORY_CARE_PROVIDER_SITE_OTHER): Payer: 59 | Admitting: Family Medicine

## 2018-04-18 ENCOUNTER — Ambulatory Visit: Payer: Self-pay

## 2018-04-18 ENCOUNTER — Other Ambulatory Visit: Payer: Self-pay

## 2018-04-18 VITALS — BP 110/68 | HR 48 | Ht 71.0 in | Wt 201.0 lb

## 2018-04-18 DIAGNOSIS — M47818 Spondylosis without myelopathy or radiculopathy, sacral and sacrococcygeal region: Secondary | ICD-10-CM

## 2018-04-18 DIAGNOSIS — M7061 Trochanteric bursitis, right hip: Secondary | ICD-10-CM

## 2018-04-18 DIAGNOSIS — M25551 Pain in right hip: Secondary | ICD-10-CM

## 2018-04-18 NOTE — Assessment & Plan Note (Signed)
Patient given injection.  Tolerated the procedure well.  Discussed icing regimen.  Discussed which activities to do which was to avoid.  Follow-up again in 4 to 8 weeks

## 2018-04-18 NOTE — Assessment & Plan Note (Signed)
Patient given injection.  Tolerated procedure well.  Discussed icing regimen and home exercise.  Discussed which activities to do which wants to avoid.  Discussed topical anti-inflammatories.  Follow-up 4 to 8 weeks

## 2018-04-18 NOTE — Patient Instructions (Signed)
Good to see you  Tried an SI injection today  Hope it will get you some relief.  Stay safe  Take a couple days off  Have a follow up in 4-6 weeks

## 2018-04-24 ENCOUNTER — Other Ambulatory Visit: Payer: Self-pay | Admitting: Internal Medicine

## 2018-06-10 ENCOUNTER — Telehealth: Payer: Self-pay

## 2018-06-10 NOTE — Telephone Encounter (Signed)
Left message for patient to call back to schedule appointment with Dr Smith. ° °

## 2018-06-12 NOTE — Progress Notes (Signed)
[name] [address] [place], [place] [address] Phone: [phone] Subjective:   I Thomas Caldwell am serving as a Neurosurgeon for Dr. Antoine Primas.   CC: Knee pain  [id number]   04/18/2018  Patient given injection.  Tolerated procedure well.  Discussed icing regimen and home exercise.  Discussed which activities to do which wants to avoid.  Discussed topical anti-inflammatories.  Follow-up 4 to 8 weeks   06/13/2018 Thomas Caldwell is a 73 y.o. male coming in with complaint of knee pain. Left knee pain. States he felt something popped.   Onset- Friday Location- superior patella  Duration-  Character dull -pain. Aggravating factors- flexion, stairs  Reliving factors-  Therapies tried-  Severity-initially 8 out of 10 but now 4 out of 10     Past Medical History:  Diagnosis Date  . ALLERGIC RHINITIS 01/11/2008  . ANEURYSM 02/02/2007   brain  . BRADYCARDIA, CHRONIC 10/06/2007  . DIVERTICULOSIS, COLON 07/05/2007  . 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 GERD 07/05/2007  . HYPERLIPIDEMIA 02/02/2007  . HYPERTENSION 02/02/2007  . Lumbago 01/11/2008  . Peptic ulcer   . PERSISTENT DISORDER INITIATING/MAINTAINING SLEEP 02/02/2007   Past Surgical History:  Procedure Laterality Date  . APPENDECTOMY  1982  . LUMBAR DISC SURGERY  12/2005   Dr. Venetia Maxon  . SAH Left brain  1992   due to brain aneurysm s/p repair   Social History   Socioeconomic History  . Marital status: Married    Spouse name: Not on file  . Number of children: 2  . Years of education: Not on file  . Highest education level: Not on file  Occupational History  . 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 Financial resource strain: Not on file  . Food insecurity:    Worry: Not on file    Inability: Not on file  . Transportation needs:    Medical: Not on file    Non-medical: Not on file  Tobacco Use  . Smoking status: Never Smoker  . Smokeless  tobacco: Never Used  Substance and Sexual Activity  . Alcohol use: Yes    Comment: 0-1 per day  . Drug use: No  . Sexual activity: Yes    Birth control/protection: None  Lifestyle  . Physical activity:    Days per week: Not on file    Minutes per session: Not on file  . Stress: Not on file  Relationships  . Social connections:    Talks on phone: Not on file    Gets together: Not on file    Attends religious service: Not on file    Active member of club or organization: Not on file    Attends meetings of clubs or organizations: Not on file    Relationship status: Not on file  Other Topics Concern  . Not on file  Social History Narrative  . Not on file   Allergies  Allergen Reactions  . Codeine     REACTION: confusion   Family History  Problem Relation Age of Onset  . Heart disease Sister   . Hypertension Father   . Heart disease Father   . Hypertension Brother        3 bother with HTN  . Esophageal cancer Brother   . Colon cancer Neg Hx   . Rectal cancer Neg Hx   . Stomach cancer Neg Hx      Current Outpatient Medications (Cardiovascular):  .  amLODipine (NORVASC) 10 MG tablet, TAKE 1 TABLET BY MOUTH EVERY DAY .  irbesartan (AVAPRO) 300 MG tablet, TAKE 1 TABLET(300 MG) BY MOUTH DAILY .  irbesartan (AVAPRO) 300 MG tablet, TAKE 1 TABLET(300 MG) BY MOUTH DAILY .  metoprolol succinate (TOPROL-XL) 25 MG 24 hr tablet, TAKE 1 TABLET(25 MG) BY MOUTH DAILY .  nitroGLYCERIN (NITRODUR - DOSED IN MG/24 HR) 0.1 mg/hr patch, APPLY 1/4 PATCH DAILY .  rosuvastatin (CRESTOR) 20 MG tablet, TAKE 1 TABLET(20 MG) BY MOUTH DAILY  Current Outpatient Medications (Respiratory):  .  fluticasone (FLONASE) 50 MCG/ACT nasal spray, Place 1 spray into both nostrils daily.  Current Outpatient Medications (Analgesics):  .  aspirin 81 MG EC tablet, Take 81 mg by mouth daily.   .  naproxen (NAPROSYN) 500 MG tablet, Take 1 tablet (500 mg total) by mouth 2 (two) times daily with a meal.  **APPOINTMENT NEEDED FOR ADDITIONAL REFILLS**   Current Outpatient Medications (Other):  .  amoxicillin-clavulanate (AUGMENTIN) 875-125 MG tablet, Take 1 tablet by mouth 2 (two) times daily. Marland Kitchen  azithromycin (ZITHROMAX Z-PAK) 250 MG tablet, 2 tab by mouth day 1, then 1 per day .  gabapentin (NEURONTIN) 100 MG capsule, TAKE 2 CAPSULES(200 MG) BY MOUTH AT BEDTIME .  Gabapentin Enacarbil ER (HORIZANT) 300 MG TBCR, Take 300 mg by mouth at bedtime. Marland Kitchen  omeprazole (PRILOSEC) 20 MG capsule, TAKE 2 CAPSULES BY MOUTH EVERY DAY .  triamcinolone cream (KENALOG) 0.5 %, APPLY EXTERNALLY TO THE AFFECTED AREA TWICE DAILY .  Vitamin D, Ergocalciferol, (DRISDOL) 50000 units CAPS capsule, TAKE 1 CAPSULE BY MOUTH EVERY 7 DAYS    Past medical history, social, surgical and family history all reviewed in electronic medical record.  No pertanent information unless stated regarding to the chief complaint.   Review of Systems:  No headache, visual changes, nausea, vomiting, diarrhea, constipation, dizziness, abdominal pain, skin rash, fevers, chills, night sweats, weight loss, swollen lymph nodes, body aches, joint swelling, muscle aches, chest pain, shortness of breath, mood changes.   Objective  Blood pressure 136/84, pulse (!) 56, height  (1.803 m), weight 197 lb (89.4 kg), SpO2 98 %.   General: No apparent distress alert and oriented x3 mood and affect normal, dressed appropriately.  HEENT: Pupils equal, extraocular movements intact  Respiratory: Patient's speak in full sentences and does not appear short of breath  Cardiovascular: No lower extremity edema, non tender, no erythema  Skin: Warm dry intact with no signs of infection or rash on extremities or on axial skeleton.  Abdomen: Soft nontender  Neuro: Cranial nerves II through XII are intact, neurovascularly intact in all extremities with 2+ DTRs and 2+ pulses.  Lymph: No lymphadenopathy of posterior or anterior cervical chain or axillae  bilaterally.  Gait normal with good balance and coordination.  MSK:  Non tender with full range of motion and good stability and symmetric strength and tone of shoulders, elbows, wrist, hip and ankles bilaterally.  Knee: left  Left knee shows effusion over the surrounding patella itself.  Tenderness to palpation over the lateral patellofemoral joint.  Patient does have some difficulty with full extension.  Patient also lacks the last 5 degrees of flexion.  Severe pain with patella pressure.  Needle appears to be stable otherwise.  MSK US performed of: left knee  This study was ordered, performed, and interpreted by Terrilee Files D.O.  Knee: Left knee exam shows the patient does have a trace effusion of the patellofemoral joint.  Patient does have some  inflammation around the superior lateral aspect of the patella as well.  No true cortical defect noted.  LCL intact  IMPRESSION: Trace effusion but otherwise unremarkable   Impression and Recommendations:     This case required medical decision making of moderate complexity. The above documentation has been reviewed and is accurate and complete Judi Saa, DO       Note: This dictation was prepared with Dragon dictation along with smaller phrase technology. Any transcriptional errors that result from this process are unintentional.

## 2018-06-13 ENCOUNTER — Encounter: Payer: Self-pay | Admitting: Family Medicine

## 2018-06-13 ENCOUNTER — Ambulatory Visit: Payer: 59 | Admitting: Family Medicine

## 2018-06-13 ENCOUNTER — Ambulatory Visit: Payer: Self-pay

## 2018-06-13 ENCOUNTER — Other Ambulatory Visit: Payer: Self-pay

## 2018-06-13 VITALS — BP 136/84 | HR 56 | Ht 71.0 in | Wt 197.0 lb

## 2018-06-13 DIAGNOSIS — M25562 Pain in left knee: Secondary | ICD-10-CM

## 2018-06-13 DIAGNOSIS — S83002A Unspecified subluxation of left patella, initial encounter: Secondary | ICD-10-CM | POA: Diagnosis not present

## 2018-06-13 NOTE — Patient Instructions (Signed)
Good to see you  Thomas Caldwell is your friend Stay active but only in the brace .   Wear brace daily for 1-2 weeks. And then with golf for another 2 weeks Exercises 3 times a week.   See me again in 4 week sto make sure you are perfect

## 2018-06-13 NOTE — Assessment & Plan Note (Signed)
Patella subluxation, true pull lite given, discussed icing, topical antihistamine, discussed and possible need for imaging if any locking.  Seems stable otherwise.  Follow-up in 4 weeks

## 2018-07-11 ENCOUNTER — Other Ambulatory Visit: Payer: Self-pay

## 2018-07-11 ENCOUNTER — Ambulatory Visit: Payer: 59 | Admitting: Family Medicine

## 2018-07-11 ENCOUNTER — Encounter: Payer: Self-pay | Admitting: Family Medicine

## 2018-07-11 DIAGNOSIS — S83002A Unspecified subluxation of left patella, initial encounter: Secondary | ICD-10-CM | POA: Diagnosis not present

## 2018-07-11 NOTE — Progress Notes (Signed)
[name] [address] [place], [place] [address] Phone: [phone] Subjective:   Thomas Caldwell, am serving as a scribe for Dr. Antoine Primas.  :    CC: Knee pain follow-up  INO:[id number]   06/13/2018: Patella subluxation, true pull lite given, discussed icing, topical antihistamine, discussed and possible need for imaging if any locking.  Seems stable otherwise.  Follow-up in 4 weeks Update 07/11/2018: Thomas Caldwell is a 73 y.o. male coming in with complaint of left knee pain. Patient was advised to wear a brace since last visit. Wears brace with activity which helps. Patient states that he does have pain with ascending and descending stairs.  Would state overall 75 to 80% better.  As long as he wears the brace no pain at all.  No worrisome pain      Past Medical History:  Diagnosis Date  . ALLERGIC RHINITIS 01/11/2008  . ANEURYSM 02/02/2007   brain  . BRADYCARDIA, CHRONIC 10/06/2007  . DIVERTICULOSIS, COLON 07/05/2007  . 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 GERD 07/05/2007  . HYPERLIPIDEMIA 02/02/2007  . HYPERTENSION 02/02/2007  . Lumbago 01/11/2008  . Peptic ulcer   . PERSISTENT DISORDER INITIATING/MAINTAINING SLEEP 02/02/2007   Past Surgical History:  Procedure Laterality Date  . APPENDECTOMY  1982  . LUMBAR DISC SURGERY  12/2005   Dr. Venetia Maxon  . SAH Left brain  1992   due to brain aneurysm s/p repair   Social History   Socioeconomic History  . Marital status: Married    Spouse name: Not on file  . Number of children: 2  . Years of education: Not on file  . Highest education level: Not on file  Occupational History  . 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 Financial resource strain: Not on file  . Food insecurity    Worry: Not on file    Inability: Not on file  . Transportation needs    Medical: Not on file    Non-medical: Not on file  Tobacco Use  . Smoking status: Never Smoker  .  Smokeless tobacco: Never Used  Substance and Sexual Activity  . Alcohol use: Yes    Comment: 0-1 per day  . Drug use: No  . Sexual activity: Yes    Birth control/protection: None  Lifestyle  . Physical activity    Days per week: Not on file    Minutes per session: Not on file  . Stress: Not on file  Relationships  . Social Musician on phone: Not on file    Gets together: Not on file    Attends religious service: Not on file    Active member of club or organization: Not on file    Attends meetings of clubs or organizations: Not on file    Relationship status: Not on file  Other Topics Concern  . Not on file  Social History Narrative  . Not on file   Allergies  Allergen Reactions  . Codeine     REACTION: confusion   Family History  Problem Relation Age of Onset  . Heart disease Sister   . Hypertension Father   . Heart disease Father   . Hypertension Brother        3 bother with HTN  . Esophageal cancer Brother   . Colon cancer Neg Hx   . Rectal cancer Neg Hx   . Stomach cancer Neg Hx  Current Outpatient Medications (Cardiovascular):  .  amLODipine (NORVASC) 10 MG tablet, TAKE 1 TABLET BY MOUTH EVERY DAY .  irbesartan (AVAPRO) 300 MG tablet, TAKE 1 TABLET(300 MG) BY MOUTH DAILY .  irbesartan (AVAPRO) 300 MG tablet, TAKE 1 TABLET(300 MG) BY MOUTH DAILY .  metoprolol succinate (TOPROL-XL) 25 MG 24 hr tablet, TAKE 1 TABLET(25 MG) BY MOUTH DAILY .  nitroGLYCERIN (NITRODUR - DOSED IN MG/24 HR) 0.1 mg/hr patch, APPLY 1/4 PATCH DAILY .  rosuvastatin (CRESTOR) 20 MG tablet, TAKE 1 TABLET(20 MG) BY MOUTH DAILY  Current Outpatient Medications (Respiratory):  .  fluticasone (FLONASE) 50 MCG/ACT nasal spray, Place 1 spray into both nostrils daily.  Current Outpatient Medications (Analgesics):  .  aspirin 81 MG EC tablet, Take 81 mg by mouth daily.   .  naproxen (NAPROSYN) 500 MG tablet, Take 1 tablet (500 mg total) by mouth 2 (two) times daily with a meal.  **APPOINTMENT NEEDED FOR ADDITIONAL REFILLS**   Current Outpatient Medications (Other):  .  amoxicillin-clavulanate (AUGMENTIN) 875-125 MG tablet, Take 1 tablet by mouth 2 (two) times daily. Marland Kitchen  azithromycin (ZITHROMAX Z-PAK) 250 MG tablet, 2 tab by mouth day 1, then 1 per day .  gabapentin (NEURONTIN) 100 MG capsule, TAKE 2 CAPSULES(200 MG) BY MOUTH AT BEDTIME .  Gabapentin Enacarbil ER (HORIZANT) 300 MG TBCR, Take 300 mg by mouth at bedtime. Marland Kitchen  omeprazole (PRILOSEC) 20 MG capsule, TAKE 2 CAPSULES BY MOUTH EVERY DAY .  triamcinolone cream (KENALOG) 0.5 %, APPLY EXTERNALLY TO THE AFFECTED AREA TWICE DAILY .  Vitamin D, Ergocalciferol, (DRISDOL) 50000 units CAPS capsule, TAKE 1 CAPSULE BY MOUTH EVERY 7 DAYS    Past medical history, social, surgical and family history all reviewed in electronic medical record.  No pertanent information unless stated regarding to the chief complaint.   Review of Systems:  No headache, visual changes, nausea, vomiting, diarrhea, constipation, dizziness, abdominal pain, skin rash, fevers, chills, night sweats, weight loss, swollen lymph nodes, body aches, joint swelling, chest pain, shortness of breath, mood changes.  Positive muscle aches  Objective  Blood pressure 130/72, pulse (!) 55, height 5\' 11"  (1.803 m), weight 200 lb (90.7 kg), SpO2 99 %.    General: No apparent distress alert and oriented x3 mood and affect normal, dressed appropriately.  HEENT: Pupils equal, extraocular movements intact  Respiratory: Patient's speak in full sentences and does not appear short of breath  Cardiovascular: No lower extremity edema, non tender, no erythema  Skin: Warm dry intact with no signs of infection or rash on extremities or on axial skeleton.  Abdomen: Soft nontender  Neuro: Cranial nerves II through XII are intact, neurovascularly intact in all extremities with 2+ DTRs and 2+ pulses.  Lymph: No lymphadenopathy of posterior or anterior cervical chain or axillae  bilaterally.  Gait normal with good balance and coordination.  MSK:  Non tender with full range of motion and good stability and symmetric strength and tone of shoulders, elbows, wrist, hip, and ankles bilaterally.  Mild arthritic changes of multiple joints left knee exam shows the patient does have full range of motion.  Trace effusion and mild crepitus of the patellofemoral joint.  No instability of the knee noted though.    Impression and Recommendations:      The above documentation has been reviewed and is accurate and complete Lyndal Pulley, DO       Note: This dictation was prepared with Dragon dictation along with smaller phrase technology. Any transcriptional errors that  result from this process are unintentional.

## 2018-07-11 NOTE — Assessment & Plan Note (Signed)
Improvement from previous exam.  Discussed continuing the bracing for another 6 weeks with exercise but otherwise increase activity as tolerated.  Ice when needed.  Follow-up patient is well can follow-up as needed

## 2018-07-26 ENCOUNTER — Encounter: Payer: Self-pay | Admitting: Family Medicine

## 2018-08-21 ENCOUNTER — Other Ambulatory Visit: Payer: Self-pay | Admitting: Internal Medicine

## 2018-09-05 ENCOUNTER — Encounter: Payer: Self-pay | Admitting: Internal Medicine

## 2018-09-05 MED ORDER — DOXYCYCLINE HYCLATE 100 MG PO TABS: 100.0000 mg | ORAL_TABLET | Freq: Two times a day (BID) | ORAL | 0 refills | Status: DC

## 2018-09-19 ENCOUNTER — Encounter: Payer: Self-pay | Admitting: Internal Medicine

## 2018-09-21 ENCOUNTER — Encounter: Payer: Self-pay | Admitting: Internal Medicine

## 2018-09-22 MED ORDER — AMLODIPINE BESYLATE 10 MG PO TABS: 10.0000 mg | ORAL_TABLET | Freq: Every day | ORAL | 1 refills | Status: DC

## 2018-09-22 MED ORDER — METOPROLOL SUCCINATE ER 25 MG PO TB24: ORAL_TABLET | ORAL | 1 refills | Status: DC

## 2018-09-22 MED ORDER — ROSUVASTATIN CALCIUM 20 MG PO TABS: ORAL_TABLET | ORAL | 1 refills | Status: DC

## 2018-10-17 ENCOUNTER — Other Ambulatory Visit: Payer: Self-pay

## 2018-10-17 ENCOUNTER — Encounter: Payer: Self-pay | Admitting: Internal Medicine

## 2018-10-17 ENCOUNTER — Other Ambulatory Visit (INDEPENDENT_AMBULATORY_CARE_PROVIDER_SITE_OTHER): Payer: No Typology Code available for payment source

## 2018-10-17 ENCOUNTER — Ambulatory Visit (INDEPENDENT_AMBULATORY_CARE_PROVIDER_SITE_OTHER): Payer: No Typology Code available for payment source | Admitting: Internal Medicine

## 2018-10-17 VITALS — BP 140/72 | HR 61 | Temp 97.6°F | Ht 71.0 in | Wt 200.0 lb

## 2018-10-17 DIAGNOSIS — E611 Iron deficiency: Secondary | ICD-10-CM

## 2018-10-17 DIAGNOSIS — R739 Hyperglycemia, unspecified: Secondary | ICD-10-CM

## 2018-10-17 DIAGNOSIS — E559 Vitamin D deficiency, unspecified: Secondary | ICD-10-CM

## 2018-10-17 DIAGNOSIS — E538 Deficiency of other specified B group vitamins: Secondary | ICD-10-CM

## 2018-10-17 DIAGNOSIS — Z0000 Encounter for general adult medical examination without abnormal findings: Secondary | ICD-10-CM | POA: Diagnosis not present

## 2018-10-17 DIAGNOSIS — Z23 Encounter for immunization: Secondary | ICD-10-CM

## 2018-10-17 LAB — URINALYSIS, ROUTINE W REFLEX MICROSCOPIC
Bilirubin Urine: NEGATIVE
Hgb urine dipstick: NEGATIVE
Ketones, ur: NEGATIVE
Leukocytes,Ua: NEGATIVE
Nitrite: NEGATIVE
RBC / HPF: NONE SEEN (ref 0–?)
Specific Gravity, Urine: 1.02 (ref 1.000–1.030)
Total Protein, Urine: NEGATIVE
Urine Glucose: NEGATIVE
Urobilinogen, UA: 0.2 (ref 0.0–1.0)
pH: 5.5 (ref 5.0–8.0)

## 2018-10-17 LAB — HEPATIC FUNCTION PANEL
ALT: 35 U/L (ref 0–53)
AST: 27 U/L (ref 0–37)
Albumin: 4.4 g/dL (ref 3.5–5.2)
Alkaline Phosphatase: 81 U/L (ref 39–117)
Bilirubin, Direct: 0.1 mg/dL (ref 0.0–0.3)
Total Bilirubin: 0.5 mg/dL (ref 0.2–1.2)
Total Protein: 7 g/dL (ref 6.0–8.3)

## 2018-10-17 LAB — PSA: PSA: 1.73 ng/mL (ref 0.10–4.00)

## 2018-10-17 LAB — CBC WITH DIFFERENTIAL/PLATELET
Basophils Absolute: 0.1 10*3/uL (ref 0.0–0.1)
Basophils Relative: 0.9 % (ref 0.0–3.0)
Eosinophils Absolute: 0.1 10*3/uL (ref 0.0–0.7)
Eosinophils Relative: 1.7 % (ref 0.0–5.0)
HCT: 44.9 % (ref 39.0–52.0)
Hemoglobin: 15 g/dL (ref 13.0–17.0)
Lymphocytes Relative: 26.4 % (ref 12.0–46.0)
Lymphs Abs: 2.1 10*3/uL (ref 0.7–4.0)
MCHC: 33.4 g/dL (ref 30.0–36.0)
MCV: 88.1 fl (ref 78.0–100.0)
Monocytes Absolute: 1 10*3/uL (ref 0.1–1.0)
Monocytes Relative: 11.9 % (ref 3.0–12.0)
Neutro Abs: 4.8 10*3/uL (ref 1.4–7.7)
Neutrophils Relative %: 59.1 % (ref 43.0–77.0)
Platelets: 230 10*3/uL (ref 150.0–400.0)
RBC: 5.1 Mil/uL (ref 4.22–5.81)
RDW: 14 % (ref 11.5–15.5)
WBC: 8.1 10*3/uL (ref 4.0–10.5)

## 2018-10-17 LAB — BASIC METABOLIC PANEL
BUN: 24 mg/dL — ABNORMAL HIGH (ref 6–23)
CO2: 27 mEq/L (ref 19–32)
Calcium: 10.1 mg/dL (ref 8.4–10.5)
Chloride: 104 mEq/L (ref 96–112)
Creatinine, Ser: 1.21 mg/dL (ref 0.40–1.50)
GFR: 58.85 mL/min — ABNORMAL LOW (ref >60.00)
Glucose, Bld: 69 mg/dL — ABNORMAL LOW (ref 70–99)
Potassium: 4.8 mEq/L (ref 3.5–5.1)
Sodium: 140 mEq/L (ref 135–145)

## 2018-10-17 LAB — LIPID PANEL
Cholesterol: 151 mg/dL (ref 0–200)
HDL: 44.9 mg/dL (ref >39.00)
LDL Cholesterol: 70 mg/dL (ref 0–99)
NonHDL: 105.67
Total CHOL/HDL Ratio: 3
Triglycerides: 180 mg/dL — ABNORMAL HIGH (ref 0.0–149.0)
VLDL: 36 mg/dL (ref 0.0–40.0)

## 2018-10-17 LAB — VITAMIN D 25 HYDROXY (VIT D DEFICIENCY, FRACTURES): VITD: 31.85 ng/mL (ref 30.00–100.00)

## 2018-10-17 LAB — HEMOGLOBIN A1C: Hgb A1c MFr Bld: 5.8 % (ref 4.6–6.5)

## 2018-10-17 LAB — IBC PANEL
Iron: 102 ug/dL (ref 42–165)
Saturation Ratios: 28.5 % (ref 20.0–50.0)
Transferrin: 256 mg/dL (ref 212.0–360.0)

## 2018-10-17 LAB — VITAMIN B12: Vitamin B-12: 548 pg/mL (ref 211–911)

## 2018-10-17 LAB — TSH: TSH: 4.4 u[IU]/mL (ref 0.35–4.50)

## 2018-10-17 MED ORDER — NAPROXEN 500 MG PO TABS: 500.0000 mg | ORAL_TABLET | Freq: Two times a day (BID) | ORAL | 2 refills | Status: DC

## 2018-10-17 MED ORDER — METOPROLOL SUCCINATE ER 25 MG PO TB24: ORAL_TABLET | ORAL | 3 refills | Status: DC

## 2018-10-17 MED ORDER — IRBESARTAN 300 MG PO TABS: ORAL_TABLET | ORAL | 3 refills | Status: DC

## 2018-10-17 MED ORDER — ROSUVASTATIN CALCIUM 20 MG PO TABS: ORAL_TABLET | ORAL | 3 refills | Status: DC

## 2018-10-17 MED ORDER — AMLODIPINE BESYLATE 10 MG PO TABS: 10.0000 mg | ORAL_TABLET | Freq: Every day | ORAL | 3 refills | Status: DC

## 2018-10-17 MED ORDER — OMEPRAZOLE 20 MG PO CPDR: 40.0000 mg | DELAYED_RELEASE_CAPSULE | Freq: Every day | ORAL | 3 refills | Status: DC

## 2018-10-17 NOTE — Assessment & Plan Note (Signed)

## 2018-10-17 NOTE — Assessment & Plan Note (Signed)
stable overall by history and exam, recent data reviewed with pt, and pt to continue medical treatment as before,  to f/u any worsening symptoms or concerns  

## 2018-10-17 NOTE — Progress Notes (Signed)
Subjective:    Patient ID: Thomas Caldwell, male    DOB: 08-Jul-1945, 73 y.o.   MRN: [id number]  HPI  Here for wellness and f/u;  Overall doing ok;  Pt denies Chest pain, worsening SOB, DOE, wheezing, orthopnea, PND, worsening LE edema, palpitations, dizziness or syncope.  Pt denies neurological change such as new headache, facial or extremity weakness.  Pt denies polydipsia, polyuria, or low sugar symptoms. Pt states overall good compliance with treatment and medications, good tolerability, and has been trying to follow appropriate diet.  Pt denies worsening depressive symptoms, suicidal ideation or panic. No fever, night sweats, wt loss, loss of appetite, or other constitutional symptoms.  Pt states good ability with ADL's, has low fall risk, home safety reviewed and adequate, no other significant changes in hearing or vision, and only occasionally active with exercise.  No new complaints Past Medical History:  Diagnosis Date  . ALLERGIC RHINITIS 01/11/2008  . ANEURYSM 02/02/2007   brain  . BRADYCARDIA, CHRONIC 10/06/2007  . DIVERTICULOSIS, COLON 07/05/2007  . 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 GERD 07/05/2007  . HYPERLIPIDEMIA 02/02/2007  . HYPERTENSION 02/02/2007  . Lumbago 01/11/2008  . Peptic ulcer   . PERSISTENT DISORDER INITIATING/MAINTAINING SLEEP 02/02/2007   Past Surgical History:  Procedure Laterality Date  . APPENDECTOMY  1982  . LUMBAR DISC SURGERY  12/2005   Dr. Venetia Maxon  . SAH Left brain  1992   due to brain aneurysm s/p repair    reports that he has never smoked. He has never used smokeless tobacco. He reports current alcohol use. He reports that he does not use drugs. family history includes Esophageal cancer in his brother; Heart disease in his father and sister; Hypertension in his brother and father. Allergies  Allergen Reactions  . Codeine     REACTION: confusion   Current Outpatient Medications on File Prior to Visit  Medication Sig Dispense Refill  . aspirin 81  MG EC tablet Take 81 mg by mouth daily.      . Gabapentin Enacarbil ER (HORIZANT) 300 MG TBCR Take 300 mg by mouth at bedtime. 90 tablet 3   No current facility-administered medications on file prior to visit.    Review of Systems Constitutional: Negative for other unusual diaphoresis, sweats, appetite or weight changes HENT: Negative for other worsening hearing loss, ear pain, facial swelling, mouth sores or neck stiffness.   Eyes: Negative for other worsening pain, redness or other visual disturbance.  Respiratory: Negative for other stridor or swelling Cardiovascular: Negative for other palpitations or other chest pain  Gastrointestinal: Negative for worsening diarrhea or loose stools, blood in stool, distention or other pain Genitourinary: Negative for hematuria, flank pain or other change in urine volume.  Musculoskeletal: Negative for myalgias or other joint swelling.  Skin: Negative for other color change, or other wound or worsening drainage.  Neurological: Negative for other syncope or numbness. Hematological: Negative for other adenopathy or swelling Psychiatric/Behavioral: Negative for hallucinations, other worsening agitation, SI, self-injury, or new decreased concentration All other system neg per pt    Objective:   Physical Exam BP 140/72 (BP Location: Left Arm, Patient Position: Sitting, Cuff Size: Large)   Pulse 61   Temp 97.6 F (36.4 C) (Oral)   Ht 5\' 11"  (1.803 m)   Wt 200 lb (90.7 kg)   SpO2 98%   BMI 27.89 kg/m  VS noted,  Constitutional: Pt is oriented to person, place, and time. Appears well-developed and well-nourished, in no significant distress and  comfortable Head: Normocephalic and atraumatic  Eyes: Conjunctivae and EOM are normal. Pupils are equal, round, and reactive to light Right Ear: External ear normal without discharge Left Ear: External ear normal without discharge Nose: Nose without discharge or deformity Mouth/Throat: Oropharynx is without  other ulcerations and moist  Neck: Normal range of motion. Neck supple. No JVD present. No tracheal deviation present or significant neck LA or mass Cardiovascular: Normal rate, regular rhythm, normal heart sounds and intact distal pulses.   Pulmonary/Chest: WOB normal and breath sounds without rales or wheezing  Abdominal: Soft. Bowel sounds are normal. NT. No HSM  Musculoskeletal: Normal range of motion. Exhibits no edema Lymphadenopathy: Has no other cervical adenopathy.  Neurological: Pt is alert and oriented to person, place, and time. Pt has normal reflexes. No cranial nerve deficit. Motor grossly intact, Gait intact Skin: Skin is warm and dry. No rash noted or new ulcerations Psychiatric:  Has normal mood and affect. Behavior is normal without agitation All otherwise neg per pt Lab Results  Component Value Date   WBC 7.2 10/14/2017   HGB 14.9 10/14/2017   HCT 44.1 10/14/2017   PLT 202.0 10/14/2017   GLUCOSE 96 10/14/2017   CHOL 143 10/14/2017   TRIG 69.0 10/14/2017   HDL 47.30 10/14/2017   LDLCALC 82 10/14/2017   ALT 30 10/14/2017   AST 27 10/14/2017   NA 138 10/14/2017   K 4.7 10/14/2017   CL 103 10/14/2017   CREATININE 1.05 10/14/2017   BUN 23 10/14/2017   CO2 28 10/14/2017   TSH 3.60 10/14/2017   PSA 1.35 10/14/2017          Assessment & Plan:

## 2018-10-17 NOTE — Addendum Note (Signed)
Addended by: Juliet Rude on: 10/17/2018 02:00 PM   Modules accepted: Orders

## 2018-10-17 NOTE — Patient Instructions (Addendum)
You had flu shot and the Tdap tetanus shot today  Please continue all other medications as before, and refills have been done if requested.  Please have the pharmacy call with any other refills you may need.  Please continue your efforts at being more active, low cholesterol diet, and weight control.  You are otherwise up to date with prevention measures today.  Please keep your appointments with your specialists as you may have planned  Please go to the LAB in the Basement (turn left off the elevator) for the tests to be done today  You will be contacted by phone if any changes need to be made immediately.  Otherwise, you will receive a letter about your results with an explanation, but please check with MyChart first.  Please remember to sign up for MyChart if you have not done so, as this will be important to you in the future with finding out test results, communicating by private email, and scheduling acute appointments online when needed.  Please return in 1 year for your yearly visit, or sooner if needed, with Lab testing done 3-5 days before

## 2018-10-19 ENCOUNTER — Encounter: Payer: Self-pay | Admitting: Internal Medicine

## 2018-12-26 ENCOUNTER — Encounter: Payer: Self-pay | Admitting: Internal Medicine

## 2018-12-26 DIAGNOSIS — L989 Disorder of the skin and subcutaneous tissue, unspecified: Secondary | ICD-10-CM

## 2019-01-06 ENCOUNTER — Other Ambulatory Visit: Payer: Self-pay

## 2019-01-06 ENCOUNTER — Ambulatory Visit: Payer: Self-pay

## 2019-01-06 ENCOUNTER — Ambulatory Visit (INDEPENDENT_AMBULATORY_CARE_PROVIDER_SITE_OTHER): Payer: No Typology Code available for payment source | Admitting: Family Medicine

## 2019-01-06 ENCOUNTER — Encounter: Payer: Self-pay | Admitting: Family Medicine

## 2019-01-06 VITALS — BP 116/60 | HR 54 | Ht 71.0 in | Wt 200.0 lb

## 2019-01-06 DIAGNOSIS — M79641 Pain in right hand: Secondary | ICD-10-CM

## 2019-01-06 DIAGNOSIS — M65341 Trigger finger, right ring finger: Secondary | ICD-10-CM | POA: Insufficient documentation

## 2019-01-06 NOTE — Assessment & Plan Note (Signed)
Patient given injection today, tolerated the procedure well, discussed icing regimen and home exercise, discussed which activities to do which wants to avoid.  Discussed bracing at night.  May need repeat injections every night for 2 weeks.

## 2019-01-06 NOTE — Progress Notes (Signed)
[facility] [address] [place], [place] [address] Phone: [phone] Subjective:   Thomas Caldwell, am serving as a scribe for Dr. Hulan Saas. This visit occurred during the SARS-CoV-2 public health emergency.  Safety protocols were in place, including screening questions prior to the visit, additional usage of staff PPE, and extensive cleaning of exam room while observing appropriate contact time as indicated for disinfecting solutions.   I'm seeing this patient by the request  of:    CC: Hand pain  IFO:[id number]   07/11/2018 Improvement from previous exam.  Discussed continuing the bracing for another 6 weeks with exercise but otherwise increase activity as tolerated.  Ice when needed.  Follow-up patient is well can follow-up as needed  Update 01/06/2019 Thomas Caldwell is a 73 y.o. male coming in with complaint of right hand pain, 4th finger triggering. Pain began 2 months ago. Is able to play golf   Patient has been moving recently.  Has moved a lot of boxes and feels like that has contributed to some of the discomfort and pain.  Patient feels everything else is done relatively well overall.-     Past Medical History:  Diagnosis Date  . ALLERGIC RHINITIS 01/11/2008  . ANEURYSM 02/02/2007   brain  . BRADYCARDIA, CHRONIC 10/06/2007  . DIVERTICULOSIS, COLON 07/05/2007  . 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 GERD 07/05/2007  . HYPERLIPIDEMIA 02/02/2007  . HYPERTENSION 02/02/2007  . Lumbago 01/11/2008  . Peptic ulcer   . PERSISTENT DISORDER INITIATING/MAINTAINING SLEEP 02/02/2007   Past Surgical History:  Procedure Laterality Date  . APPENDECTOMY  1982  . LUMBAR DISC SURGERY  12/2005   Dr. Vertell Limber  . [place] Left brain  1992   due to brain aneurysm s/p repair   Social History   Socioeconomic History  . Marital status: Married    Spouse name: Not on file  . Number of children: 2  . Years of education: Not on file  . Highest education level: Not  on file  Occupational History  . Occupation: Land: [employer]  Tobacco Use  . Smoking status: Never Smoker  . Smokeless tobacco: Never Used  Substance and Sexual Activity  . Alcohol use: Yes    Comment: 0-1 per day  . Drug use: Caldwell  . Sexual activity: Yes    Birth control/protection: None  Other Topics Concern  . Not on file  Social History Narrative  . Not on file   Social Determinants of Health   Financial Resource Strain:   . Difficulty of Paying Living Expenses: Not on file  Food Insecurity:   . Worried About Charity fundraiser in the Last Year: Not on file  . Ran Out of Food in the Last Year: Not on file  Transportation Needs:   . Lack of Transportation (Medical): Not on file  . Lack of Transportation (Non-Medical): Not on file  Physical Activity:   . Days of Exercise per Week: Not on file  . Minutes of Exercise per Session: Not on file  Stress:   . Feeling of Stress : Not on file  Social Connections:   . Frequency of Communication with Friends and Family: Not on file  . Frequency of Social Gatherings with Friends and Family: Not on file  . Attends Religious Services: Not on file  . Active Member of Clubs or Organizations: Not on file  . Attends Archivist Meetings: Not on file  . Marital  Status: Not on file   Allergies  Allergen Reactions  . Codeine     REACTION: confusion   Family History  Problem Relation Age of Onset  . Heart disease Sister   . Hypertension Father   . Heart disease Father   . Hypertension Brother        3 bother with HTN  . Esophageal cancer Brother   . Colon cancer Neg Hx   . Rectal cancer Neg Hx   . Stomach cancer Neg Hx      Current Outpatient Medications (Cardiovascular):  .  amLODipine (NORVASC) 10 MG tablet, Take 1 tablet (10 mg total) by mouth daily. .  irbesartan (AVAPRO) 300 MG tablet, TAKE 1 TABLET(300 MG) BY MOUTH DAILY .  metoprolol succinate (TOPROL-XL) 25 MG 24 hr tablet, TAKE 1  TABLET(25 MG) BY MOUTH DAILY .  rosuvastatin (CRESTOR) 20 MG tablet, TAKE 1 TABLET(20 MG) BY MOUTH DAILY   Current Outpatient Medications (Analgesics):  .  aspirin 81 MG EC tablet, Take 81 mg by mouth daily.   .  naproxen (NAPROSYN) 500 MG tablet, Take 1 tablet (500 mg total) by mouth 2 (two) times daily with a meal.   Current Outpatient Medications (Other):  .  Gabapentin Enacarbil ER (HORIZANT) 300 MG TBCR, Take 300 mg by mouth at bedtime. Marland Kitchen  omeprazole (PRILOSEC) 20 MG capsule, Take 2 capsules (40 mg total) by mouth daily.    Past medical history, social, surgical and family history all reviewed in electronic medical record.  Caldwell pertanent information unless stated regarding to the chief complaint.   Review of Systems:  Caldwell headache, visual changes, nausea, vomiting, diarrhea, constipation, dizziness, abdominal pain, skin rash, fevers, chills, night sweats, weight loss, swollen lymph nodes, body aches, joint swelling, muscle aches, chest pain, shortness of breath, mood changes.   Objective  Blood pressure 116/60, pulse (!) 54, height 5\' 11"  (1.803 m), weight 200 lb (90.7 kg), SpO2 98 %.    General: Caldwell apparent distress alert and oriented x3 mood and affect normal, dressed appropriately.  HEENT: Pupils equal, extraocular movements intact  Respiratory: Patient's speak in full sentences and does not appear short of breath  Cardiovascular: Caldwell lower extremity edema, non tender, Caldwell erythema  Skin: Warm dry intact with Caldwell signs of infection or rash on extremities or on axial skeleton.  Abdomen: Soft nontender  Neuro: Cranial nerves II through XII are intact, neurovascularly intact in all extremities with 2+ DTRs and 2+ pulses.  Lymph: Caldwell lymphadenopathy of posterior or anterior cervical chain or axillae bilaterally.  Gait normal with good balance and coordination.  MSK:  Non tender with full range of motion and good stability and symmetric strength and tone of shoulders, elbows, wrist,  hip, knee and ankles bilaterally.  Mild arthritic changes of multiple joints  Right hand exam shows the patient does have a trigger nodule noted at the A2 pulley on the palmar aspect of the right hand.  Triggering noted today.  Patient otherwise has some diffuse tenderness over the MCP joint of the third and second finger as well.  Procedure: Real-time Ultrasound Guided Injection of right fourth finger tendon sheath Device: GE Logiq Q7 Ultrasound guided injection is preferred based studies that show increased duration, increased effect, greater accuracy, decreased procedural pain, increased response rate, and decreased cost with ultrasound guided versus blind injection.  Verbal informed consent obtained.  Time-out conducted.  Noted Caldwell overlying erythema, induration, or other signs of local infection.  Skin prepped in a sterile  fashion.  Local anesthesia: Topical Ethyl chloride.  With sterile technique and under real time ultrasound guidance: With a 25-gauge half inch needle injected with 0.5 cc of 0.5% Marcaine and 0.5 cc of Kenalog 40 mg/mL Completed without difficulty  Pain immediately resolved suggesting accurate placement of the medication.  Advised to call if fevers/chills, erythema, induration, drainage, or persistent bleeding.  Images permanently stored and available for review in the ultrasound unit.  Impression: Technically successful ultrasound guided injection.    Impression and Recommendations:     This case required medical decision making of moderate complexity. The above documentation has been reviewed and is accurate and complete Judi Saa, DO       Note: This dictation was prepared with Dragon dictation along with smaller phrase technology. Any transcriptional errors that result from this process are unintentional.

## 2019-01-10 ENCOUNTER — Other Ambulatory Visit: Payer: Self-pay | Admitting: *Deleted

## 2019-01-10 MED ORDER — NAPROXEN 500 MG PO TABS: 500.0000 mg | ORAL_TABLET | Freq: Two times a day (BID) | ORAL | 2 refills | Status: DC

## 2019-02-02 ENCOUNTER — Ambulatory Visit: Payer: No Typology Code available for payment source | Attending: Internal Medicine

## 2019-02-02 DIAGNOSIS — Z20822 Contact with and (suspected) exposure to covid-19: Secondary | ICD-10-CM

## 2019-02-04 ENCOUNTER — Encounter: Payer: Self-pay | Admitting: Internal Medicine

## 2019-02-04 LAB — NOVEL CORONAVIRUS, NAA: SARS-CoV-2, NAA: NOT DETECTED

## 2019-02-09 ENCOUNTER — Ambulatory Visit: Payer: No Typology Code available for payment source | Attending: Internal Medicine

## 2019-02-09 DIAGNOSIS — Z20822 Contact with and (suspected) exposure to covid-19: Secondary | ICD-10-CM

## 2019-02-11 LAB — NOVEL CORONAVIRUS, NAA: SARS-CoV-2, NAA: NOT DETECTED

## 2019-02-13 ENCOUNTER — Encounter: Payer: Self-pay | Admitting: Internal Medicine

## 2019-02-23 ENCOUNTER — Encounter: Payer: Self-pay | Admitting: Internal Medicine

## 2019-02-23 ENCOUNTER — Ambulatory Visit: Payer: No Typology Code available for payment source

## 2019-02-23 MED ORDER — AZITHROMYCIN 250 MG PO TABS: ORAL_TABLET | ORAL | 1 refills | Status: DC

## 2019-03-04 ENCOUNTER — Ambulatory Visit: Payer: No Typology Code available for payment source

## 2019-03-06 ENCOUNTER — Ambulatory Visit: Payer: No Typology Code available for payment source

## 2019-03-21 ENCOUNTER — Encounter: Payer: Self-pay | Admitting: Family Medicine

## 2019-03-23 ENCOUNTER — Ambulatory Visit (INDEPENDENT_AMBULATORY_CARE_PROVIDER_SITE_OTHER): Payer: No Typology Code available for payment source | Admitting: Family Medicine

## 2019-03-23 ENCOUNTER — Encounter: Payer: Self-pay | Admitting: Family Medicine

## 2019-03-23 ENCOUNTER — Ambulatory Visit (INDEPENDENT_AMBULATORY_CARE_PROVIDER_SITE_OTHER): Payer: No Typology Code available for payment source

## 2019-03-23 ENCOUNTER — Other Ambulatory Visit: Payer: Self-pay

## 2019-03-23 VITALS — BP 126/70 | HR 50 | Ht 71.0 in | Wt 200.0 lb

## 2019-03-23 DIAGNOSIS — M25561 Pain in right knee: Secondary | ICD-10-CM

## 2019-03-23 DIAGNOSIS — G8929 Other chronic pain: Secondary | ICD-10-CM

## 2019-03-23 NOTE — Patient Instructions (Addendum)
Good to see you Xray today See me again in 4-5 weeks 

## 2019-03-23 NOTE — Assessment & Plan Note (Signed)
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  Patient has never had surgical intervention for medial meniscal injury previously.  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  Depending on how patient is doing we will continue with conservative therapy.  Patient given a hinged brace that I think will be beneficial.  Follow-up with me again in 4 to 8 weeks.

## 2019-03-23 NOTE — Progress Notes (Signed)
[facility] [address] [place] [address] Phone: [phone] Subjective:   I Thomas Caldwell am serving as a Neurosurgeon for Dr. Antoine Caldwell.  This visit occurred during the SARS-CoV-2 public health emergency.  Safety protocols were in place, including screening questions prior to the visit, additional usage of staff PPE, and extensive cleaning of exam room while observing appropriate contact time as indicated for disinfecting solutions.   I'm seeing this patient by the request  of:  Thomas Levins, MD  CC: Right knee pain  EXH:[id number]   06/13/2018 Patella subluxation, true pull lite given, discussed icing, topical antihistamine, discussed and possible need for imaging if any locking.  Seems stable otherwise.  Follow-up in 4 weeks  Update 03/23/2019 Thomas Caldwell is a 74 y.o. male coming in with complaint of right knee pain. Lateral knee pain. States the pain is sore. Stairs is most painful. Knee is weak. Twisting motions are most painful.  Patient states that he has a golf trip and is concerned that he would not be able to finish it at the moment.  He states that the pain is on the lateral aspect of the knee.  Patient states sometimes a throbbing aching sensation at night as well.  Some mild increase in instability.     Past Medical History:  Diagnosis Date  . ALLERGIC RHINITIS 01/11/2008  . ANEURYSM 02/02/2007   brain  . BRADYCARDIA, CHRONIC 10/06/2007  . DIVERTICULOSIS, COLON 07/05/2007  . 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 GERD 07/05/2007  . HYPERLIPIDEMIA 02/02/2007  . HYPERTENSION 02/02/2007  . Lumbago 01/11/2008  . Peptic ulcer   . PERSISTENT DISORDER INITIATING/MAINTAINING SLEEP 02/02/2007   Past Surgical History:  Procedure Laterality Date  . APPENDECTOMY  1982  . LUMBAR DISC SURGERY  12/2005   Dr. Venetia Maxon  . SAH Left brain  1992   due to brain aneurysm s/p repair   Social History   Socioeconomic History  . Marital status: Married     Spouse name: Not on file  . Number of children: 2  . Years of education: Not on file  . Highest education level: Not on file  Occupational History  . Occupation: Copywriter, advertising: [employer]  Tobacco Use  . Smoking status: Never Smoker  . Smokeless tobacco: Never Used  Substance and Sexual Activity  . Alcohol use: Yes    Comment: 0-1 per day  . Drug use: No  . Sexual activity: Yes    Birth control/protection: None  Other Topics Concern  . Not on file  Social History Narrative  . Not on file   Social Determinants of Health   Financial Resource Strain:   . Difficulty of Paying Living Expenses: Not on file  Food Insecurity:   . Worried About Programme researcher, broadcasting/film/video in the Last Year: Not on file  . Ran Out of Food in the Last Year: Not on file  Transportation Needs:   . Lack of Transportation (Medical): Not on file  . Lack of Transportation (Non-Medical): Not on file  Physical Activity:   . Days of Exercise per Week: Not on file  . Minutes of Exercise per Session: Not on file  Stress:   . Feeling of Stress : Not on file  Social Connections:   . Frequency of Communication with Friends and Family: Not on file  . Frequency of Social Gatherings with Friends and Family: Not on file  . Attends Religious Services: Not on file  .  Active Member of Clubs or Organizations: Not on file  . Attends Banker Meetings: Not on file  . Marital Status: Not on file   Allergies  Allergen Reactions  . Codeine     REACTION: confusion   Family History  Problem Relation Age of Onset  . Heart disease Sister   . Hypertension Father   . Heart disease Father   . Hypertension Brother        3 bother with HTN  . Esophageal cancer Brother   . Colon cancer Neg Hx   . Rectal cancer Neg Hx   . Stomach cancer Neg Hx      Current Outpatient Medications (Cardiovascular):  .  amLODipine (NORVASC) 10 MG tablet, Take 1 tablet (10 mg total) by mouth daily. .  irbesartan  (AVAPRO) 300 MG tablet, TAKE 1 TABLET(300 MG) BY MOUTH DAILY .  metoprolol succinate (TOPROL-XL) 25 MG 24 hr tablet, TAKE 1 TABLET(25 MG) BY MOUTH DAILY .  rosuvastatin (CRESTOR) 20 MG tablet, TAKE 1 TABLET(20 MG) BY MOUTH DAILY   Current Outpatient Medications (Analgesics):  .  aspirin 81 MG EC tablet, Take 81 mg by mouth daily.   .  naproxen (NAPROSYN) 500 MG tablet, Take 1 tablet (500 mg total) by mouth 2 (two) times daily with a meal.   Current Outpatient Medications (Other):  .  azithromycin (ZITHROMAX) 250 MG tablet, 2 tab by mouth day 1, then 1 per day .  Gabapentin Enacarbil ER (HORIZANT) 300 MG TBCR, Take 300 mg by mouth at bedtime. Marland Kitchen  omeprazole (PRILOSEC) 20 MG capsule, Take 2 capsules (40 mg total) by mouth daily.   Reviewed prior external information including notes and imaging from  primary care provider As well as notes that were available from care everywhere and other healthcare systems.  Past medical history, social, surgical and family history all reviewed in electronic medical record.  No pertanent information unless stated regarding to the chief complaint.   Review of Systems:  No headache, visual changes, nausea, vomiting, diarrhea, constipation, dizziness, abdominal pain, skin rash, fevers, chills, night sweats, weight loss, swollen lymph nodes, body aches, joint swelling, chest pain, shortness of breath, mood changes. POSITIVE muscle aches  Objective  Blood pressure 126/70, pulse (!) 50, height 5\' 11"  (1.803 m), weight 200 lb (90.7 kg), SpO2 96 %.   General: No apparent distress alert and oriented x3 mood and affect normal, dressed appropriately.  HEENT: Pupils equal, extraocular movements intact  Respiratory: Patient's speak in full sentences and does not appear short of breath  Cardiovascular: No lower extremity edema, non tender, no erythema  Skin: Warm dry intact with no signs of infection or rash on extremities or on axial skeleton.  Abdomen: Soft  nontender  Neuro: Cranial nerves II through XII are intact, neurovascularly intact in all extremities with 2+ DTRs and 2+ pulses.  Lymph: No lymphadenopathy of posterior or anterior cervical chain or axillae bilaterally.  Gait normal with good balance and coordination.  MSK:  Non tender with full range of motion and good stability and symmetric strength and tone of shoulders, elbows, wrist, hip, and ankles bilaterally.  Right knee exam shows the patient does have a positive McMurray's.  Patient does have some abnormal hypertrophy around the fibular head it appears.  Patient does have near full range of motion but lacks last 2 degrees of extension of the knee.  Positive grind noted of the patella as well.  After informed written and verbal consent, patient was seated  on exam table. Right knee was prepped with alcohol swab and utilizing anterolateral approach, patient's right knee space was injected with 4:1  marcaine 0.5%: Kenalog 40mg /dL. Patient tolerated the procedure well without immediate complications.   Impression and Recommendations:     This case required medical decision making of moderate complexity. The above documentation has been reviewed and is accurate and complete Lyndal Pulley, DO       Note: This dictation was prepared with Dragon dictation along with smaller phrase technology. Any transcriptional errors that result from this process are unintentional.

## 2019-04-04 ENCOUNTER — Encounter: Payer: Self-pay | Admitting: Internal Medicine

## 2019-04-04 ENCOUNTER — Ambulatory Visit (INDEPENDENT_AMBULATORY_CARE_PROVIDER_SITE_OTHER): Payer: No Typology Code available for payment source | Admitting: Internal Medicine

## 2019-04-04 DIAGNOSIS — R197 Diarrhea, unspecified: Secondary | ICD-10-CM | POA: Diagnosis not present

## 2019-04-04 DIAGNOSIS — R1084 Generalized abdominal pain: Secondary | ICD-10-CM

## 2019-04-04 DIAGNOSIS — R739 Hyperglycemia, unspecified: Secondary | ICD-10-CM | POA: Diagnosis not present

## 2019-04-04 MED ORDER — METRONIDAZOLE 500 MG PO TABS: 500.0000 mg | ORAL_TABLET | Freq: Three times a day (TID) | ORAL | 0 refills | Status: AC

## 2019-04-04 MED ORDER — ONDANSETRON 4 MG PO TBDP: 4.0000 mg | ORAL_TABLET | Freq: Three times a day (TID) | ORAL | 1 refills | Status: DC | PRN

## 2019-04-04 NOTE — Assessment & Plan Note (Addendum)
Etiology unclear, for zofran prn, cc with labs, and consider GI referral  I spent 30 minutes in preparing to see the patient by review of recent labs, imaging and procedures, obtaining and reviewing separately obtained history, communicating with the patient and family or caregiver, ordering medications, tests or procedures, and documenting clinical information in the EHR including the differential Dx, treatment, and any further evaluation and other management of abd pain, diarrhea, hyperglycemia

## 2019-04-04 NOTE — Progress Notes (Signed)
Patient ID: Thomas Caldwell, male   DOB: 1945/06/29, 74 y.o.   MRN: [id number]  Virtual Visit via Video Note  I connected with Thomas Caldwell on 04/04/19 at  3:20 PM EST by a video enabled telemedicine application and verified that I am speaking with the correct person using two identifiers.  Location: Patient: at home Provider: at office   I discussed the limitations of evaluation and management by telemedicine and the availability of in person appointments. The patient expressed understanding and agreed to proceed.  History of Present Illness: Here with acute onset 3-4 days reurrent n/v/d with low grade temp, started after eating sea bass but no one else sick at home an symptoms still persistent; spent most of the time in bed, states feels like my "esophagus is raw" and even taking water hurts with ST and abd "grinding" pain, moderate.  Has lost overall 6 lbs.  Has taken several doses pepto bismol with black stools after but remembers hx of UGI bleed and black stools, and wife urged him to call.  No BRB or hematemesis or coffee grounds.  Stopped the pepto and mylanta may have helped a bit more.  Pt denies chest pain, increased sob or doe, wheezing, orthopnea, PND, increased LE swelling, palpitations, dizziness or syncope.  Pt denies new neurological symptoms such as new headache, or facial or extremity weakness or numbness   Pt denies polydipsia, polyuria Past Medical History:  Diagnosis Date  . ALLERGIC RHINITIS 01/11/2008  . ANEURYSM 02/02/2007   brain  . BRADYCARDIA, CHRONIC 10/06/2007  . DIVERTICULOSIS, COLON 07/05/2007  . 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 GERD 07/05/2007  . HYPERLIPIDEMIA 02/02/2007  . HYPERTENSION 02/02/2007  . Lumbago 01/11/2008  . Peptic ulcer   . PERSISTENT DISORDER INITIATING/MAINTAINING SLEEP 02/02/2007   Past Surgical History:  Procedure Laterality Date  . APPENDECTOMY  1982  . LUMBAR DISC SURGERY  12/2005   Dr. Venetia Maxon  . SAH Left brain  1992   due to  brain aneurysm s/p repair    reports that he has never smoked. He has never used smokeless tobacco. He reports current alcohol use. He reports that he does not use drugs. family history includes Esophageal cancer in his brother; Heart disease in his father and sister; Hypertension in his brother and father. Allergies  Allergen Reactions  . Codeine     REACTION: confusion   Current Outpatient Medications on File Prior to Visit  Medication Sig Dispense Refill  . amLODipine (NORVASC) 10 MG tablet Take 1 tablet (10 mg total) by mouth daily. 90 tablet 3  . aspirin 81 MG EC tablet Take 81 mg by mouth daily.      Marland Kitchen azithromycin (ZITHROMAX) 250 MG tablet 2 tab by mouth day 1, then 1 per day 6 tablet 1  . Gabapentin Enacarbil ER (HORIZANT) 300 MG TBCR Take 300 mg by mouth at bedtime. 90 tablet 3  . irbesartan (AVAPRO) 300 MG tablet TAKE 1 TABLET(300 MG) BY MOUTH DAILY 90 tablet 3  . metoprolol succinate (TOPROL-XL) 25 MG 24 hr tablet TAKE 1 TABLET(25 MG) BY MOUTH DAILY 90 tablet 3  . naproxen (NAPROSYN) 500 MG tablet Take 1 tablet (500 mg total) by mouth 2 (two) times daily with a meal. 60 tablet 2  . omeprazole (PRILOSEC) 20 MG capsule Take 2 capsules (40 mg total) by mouth daily. 180 capsule 3  . rosuvastatin (CRESTOR) 20 MG tablet TAKE 1 TABLET(20 MG) BY MOUTH DAILY 90 tablet 3   No current facility-administered medications  on file prior to visit.    Observations/Objective: Alert, NAD, appropriate mood and affect, resps normal, cn 2-12 intact, moves all 4s, no visible rash or swelling Lab Results  Component Value Date   WBC 8.1 10/17/2018   HGB 15.0 10/17/2018   HCT 44.9 10/17/2018   PLT 230.0 10/17/2018   GLUCOSE 69 (L) 10/17/2018   CHOL 151 10/17/2018   TRIG 180.0 (H) 10/17/2018   HDL 44.90 10/17/2018   LDLCALC 70 10/17/2018   ALT 35 10/17/2018   AST 27 10/17/2018   NA 140 10/17/2018   K 4.8 10/17/2018   CL 104 10/17/2018   CREATININE 1.21 10/17/2018   BUN 24 (H) 10/17/2018    CO2 27 10/17/2018   TSH 4.40 10/17/2018   PSA 1.73 10/17/2018   HGBA1C 5.8 10/17/2018   Assessment and Plan: See notes  Follow Up Instructions: See notes   I discussed the assessment and treatment plan with the patient. The patient was provided an opportunity to ask questions and all were answered. The patient agreed with the plan and demonstrated an understanding of the instructions.   The patient was advised to call back or seek an in-person evaluation if the symptoms worsen or if the condition fails to improve as anticipated.  [name], MD

## 2019-04-04 NOTE — Assessment & Plan Note (Signed)
stable overall by history and exam, recent data reviewed with pt, and pt to continue medical treatment as before,  to f/u any worsening symptoms or concerns  

## 2019-04-04 NOTE — Patient Instructions (Signed)
Please take all new medication as prescribed  Please continue all other medications as before, and refills have been done if requested.  Please have the pharmacy call with any other refills you may need.  Please keep your appointments with your specialists as you may have planned  Please go to the LAB at the blood drawing area for the tests to be done at the [facility] lab in the AM

## 2019-04-04 NOTE — Assessment & Plan Note (Signed)
Exam c/w possibe febrile illness, cant r/o c diff, declines ED or UC, for labs in am as ordered including stool studies, and empiric flagyl

## 2019-04-05 ENCOUNTER — Other Ambulatory Visit (INDEPENDENT_AMBULATORY_CARE_PROVIDER_SITE_OTHER): Payer: No Typology Code available for payment source

## 2019-04-05 ENCOUNTER — Other Ambulatory Visit: Payer: No Typology Code available for payment source

## 2019-04-05 DIAGNOSIS — R1084 Generalized abdominal pain: Secondary | ICD-10-CM

## 2019-04-05 DIAGNOSIS — R197 Diarrhea, unspecified: Secondary | ICD-10-CM

## 2019-04-05 LAB — URINALYSIS, ROUTINE W REFLEX MICROSCOPIC
Bilirubin Urine: NEGATIVE
Hgb urine dipstick: NEGATIVE
Ketones, ur: NEGATIVE
Leukocytes,Ua: NEGATIVE
Nitrite: NEGATIVE
Specific Gravity, Urine: 1.02 (ref 1.000–1.030)
Total Protein, Urine: NEGATIVE
Urine Glucose: NEGATIVE
Urobilinogen, UA: 0.2 (ref 0.0–1.0)
pH: 7 (ref 5.0–8.0)

## 2019-04-05 LAB — BASIC METABOLIC PANEL
BUN: 21 mg/dL (ref 6–23)
CO2: 29 mEq/L (ref 19–32)
Calcium: 9.4 mg/dL (ref 8.4–10.5)
Chloride: 105 mEq/L (ref 96–112)
Creatinine, Ser: 1.17 mg/dL (ref 0.40–1.50)
GFR: 61.1 mL/min (ref >60.00)
Glucose, Bld: 111 mg/dL — ABNORMAL HIGH (ref 70–99)
Potassium: 4.7 mEq/L (ref 3.5–5.1)
Sodium: 140 mEq/L (ref 135–145)

## 2019-04-05 LAB — HEPATIC FUNCTION PANEL
ALT: 31 U/L (ref 0–53)
AST: 27 U/L (ref 0–37)
Albumin: 3.8 g/dL (ref 3.5–5.2)
Alkaline Phosphatase: 69 U/L (ref 39–117)
Bilirubin, Direct: 0.1 mg/dL (ref 0.0–0.3)
Total Bilirubin: 0.9 mg/dL (ref 0.2–1.2)
Total Protein: 6.7 g/dL (ref 6.0–8.3)

## 2019-04-05 LAB — CBC WITH DIFFERENTIAL/PLATELET
Basophils Absolute: 0 10*3/uL (ref 0.0–0.1)
Basophils Relative: 0.7 % (ref 0.0–3.0)
Eosinophils Absolute: 0.2 10*3/uL (ref 0.0–0.7)
Eosinophils Relative: 3.1 % (ref 0.0–5.0)
HCT: 44.6 % (ref 39.0–52.0)
Hemoglobin: 15 g/dL (ref 13.0–17.0)
Lymphocytes Relative: 28 % (ref 12.0–46.0)
Lymphs Abs: 1.8 10*3/uL (ref 0.7–4.0)
MCHC: 33.6 g/dL (ref 30.0–36.0)
MCV: 87.5 fl (ref 78.0–100.0)
Monocytes Absolute: 0.7 10*3/uL (ref 0.1–1.0)
Monocytes Relative: 11.5 % (ref 3.0–12.0)
Neutro Abs: 3.7 10*3/uL (ref 1.4–7.7)
Neutrophils Relative %: 56.7 % (ref 43.0–77.0)
Platelets: 199 10*3/uL (ref 150.0–400.0)
RBC: 5.1 Mil/uL (ref 4.22–5.81)
RDW: 13.9 % (ref 11.5–15.5)
WBC: 6.5 10*3/uL (ref 4.0–10.5)

## 2019-04-05 LAB — LIPASE: Lipase: 31 U/L (ref 11.0–59.0)

## 2019-04-10 ENCOUNTER — Other Ambulatory Visit: Payer: Self-pay | Admitting: Internal Medicine

## 2019-04-10 NOTE — Telephone Encounter (Signed)
Please refill as per office routine med refill policy (all routine meds refilled for 3 mo or monthly per pt preference up to one year from last visit, then month to month grace period for 3 mo, then further med refills will have to be denied)  

## 2019-04-12 LAB — C. DIFFICILE GDH AND TOXIN A/B
GDH ANTIGEN: NOT DETECTED
MICRO NUMBER:: 10236103
SPECIMEN QUALITY:: ADEQUATE
TOXIN A AND B: NOT DETECTED

## 2019-04-12 LAB — OVA AND PARASITE EXAMINATION
CONCENTRATE RESULT:: NONE SEEN
MICRO NUMBER:: 10235989
SPECIMEN QUALITY:: ADEQUATE
TRICHROME RESULT:: NONE SEEN

## 2019-04-12 LAB — STOOL CULTURE
MICRO NUMBER:: 10235987
MICRO NUMBER:: 10235988
MICRO NUMBER:: 10235990
SHIGA RESULT:: NOT DETECTED
SPECIMEN QUALITY:: ADEQUATE
SPECIMEN QUALITY:: ADEQUATE
SPECIMEN QUALITY:: ADEQUATE

## 2019-05-01 ENCOUNTER — Ambulatory Visit: Payer: No Typology Code available for payment source | Admitting: Family Medicine

## 2019-07-09 ENCOUNTER — Other Ambulatory Visit: Payer: Self-pay | Admitting: Internal Medicine

## 2019-08-10 ENCOUNTER — Encounter: Payer: Self-pay | Admitting: Internal Medicine

## 2019-08-11 ENCOUNTER — Other Ambulatory Visit: Payer: Self-pay

## 2019-08-11 ENCOUNTER — Encounter: Payer: Self-pay | Admitting: Internal Medicine

## 2019-08-11 ENCOUNTER — Ambulatory Visit: Payer: No Typology Code available for payment source | Admitting: Internal Medicine

## 2019-08-11 DIAGNOSIS — R001 Bradycardia, unspecified: Secondary | ICD-10-CM | POA: Diagnosis not present

## 2019-08-11 DIAGNOSIS — R002 Palpitations: Secondary | ICD-10-CM

## 2019-08-11 DIAGNOSIS — I10 Essential (primary) hypertension: Secondary | ICD-10-CM

## 2019-08-11 DIAGNOSIS — R739 Hyperglycemia, unspecified: Secondary | ICD-10-CM | POA: Diagnosis not present

## 2019-08-11 NOTE — Progress Notes (Signed)
Subjective:    Patient ID: Thomas Caldwell, male    DOB: Aug 29, 1945, 74 y.o.   MRN: [id number]  HPI  Here with 1 mo ongoing increased stress mostly work related and has become aware of recurring daily almost constant palpitations, wondering about afib.  Maybe considering retirement, does not want travel to [place] anymore.  Pt denies chest pain, increased sob or doe, wheezing, orthopnea, PND, increased LE swelling, dizziness or syncope. Pt denies new neurological symptoms such as new headache, or facial or extremity weakness or numbness   Pt denies polydipsia, polyuria Past Medical History:  Diagnosis Date  . ALLERGIC RHINITIS 01/11/2008  . ANEURYSM 02/02/2007   brain  . BRADYCARDIA, CHRONIC 10/06/2007  . DIVERTICULOSIS, COLON 07/05/2007  . 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 GERD 07/05/2007  . HYPERLIPIDEMIA 02/02/2007  . HYPERTENSION 02/02/2007  . Lumbago 01/11/2008  . Peptic ulcer   . PERSISTENT DISORDER INITIATING/MAINTAINING SLEEP 02/02/2007   Past Surgical History:  Procedure Laterality Date  . APPENDECTOMY  1982  . LUMBAR DISC SURGERY  12/2005   Dr. Venetia Maxon  . SAH Left brain  1992   due to brain aneurysm s/p repair    reports that he has never smoked. He has never used smokeless tobacco. He reports current alcohol use. He reports that he does not use drugs. family history includes Esophageal cancer in his brother; Heart disease in his father and sister; Hypertension in his brother and father. Allergies  Allergen Reactions  . Codeine     REACTION: confusion   Current Outpatient Medications on File Prior to Visit  Medication Sig Dispense Refill  . amLODipine (NORVASC) 10 MG tablet Take 1 tablet (10 mg total) by mouth daily. 90 tablet 3  . aspirin 81 MG EC tablet Take 81 mg by mouth daily.      Marland Kitchen azithromycin (ZITHROMAX) 250 MG tablet 2 tab by mouth day 1, then 1 per day 6 tablet 1  . Gabapentin Enacarbil ER (HORIZANT) 300 MG TBCR Take 300 mg by mouth at bedtime. 90 tablet 3  .  irbesartan (AVAPRO) 300 MG tablet TAKE 1 TABLET(300 MG) BY MOUTH DAILY 90 tablet 3  . metoprolol succinate (TOPROL-XL) 25 MG 24 hr tablet TAKE 1 TABLET(25 MG) BY MOUTH DAILY 90 tablet 3  . naproxen (NAPROSYN) 500 MG tablet TAKE 1 TABLET(500 MG) BY MOUTH TWICE DAILY WITH A MEAL 60 tablet 2  . omeprazole (PRILOSEC) 20 MG capsule Take 2 capsules (40 mg total) by mouth daily. 180 capsule 3  . ondansetron (ZOFRAN ODT) 4 MG disintegrating tablet Take 1 tablet (4 mg total) by mouth every 8 (eight) hours as needed for nausea or vomiting. 30 tablet 1  . rosuvastatin (CRESTOR) 20 MG tablet TAKE 1 TABLET(20 MG) BY MOUTH DAILY 90 tablet 3   No current facility-administered medications on file prior to visit.   Review of Systems All otherwise neg per pt     Objective:   Physical Exam BP 140/70 (BP Location: Right Arm, Patient Position: Sitting, Cuff Size: Large)   Pulse (!) 47   Temp 98.1 F (36.7 C) (Oral)   Ht 5\' 11"  (1.803 m)   Wt 199 lb (90.3 kg)   SpO2 97%   BMI 27.75 kg/m  VS noted,  Constitutional: Pt appears in NAD HENT: Head: NCAT.  Right Ear: External ear normal.  Left Ear: External ear normal.  Eyes: . Pupils are equal, round, and reactive to light. Conjunctivae and EOM are normal Nose: without d/c or deformity  Neck: Neck supple. Gross normal ROM Cardiovascular: Normal rate and regular rhythm.  With ectopy noted  Pulmonary/Chest: Effort normal and breath sounds without rales or wheezing.  Neurological: Pt is alert. At baseline orientation, motor grossly intact Skin: Skin is warm. No rashes, other new lesions, no LE edema Psychiatric: Pt behavior is normal without agitation  All otherwise neg per pt Lab Results  Component Value Date   WBC 6.5 04/05/2019   HGB 15.0 04/05/2019   HCT 44.6 04/05/2019   PLT 199.0 04/05/2019   GLUCOSE 111 (H) 04/05/2019   CHOL 151 10/17/2018   TRIG 180.0 (H) 10/17/2018   HDL 44.90 10/17/2018   LDLCALC 70 10/17/2018   ALT 31 04/05/2019   AST  27 04/05/2019   NA 140 04/05/2019   K 4.7 04/05/2019   CL 105 04/05/2019   CREATININE 1.17 04/05/2019   BUN 21 04/05/2019   CO2 29 04/05/2019   TSH 4.40 10/17/2018   PSA 1.73 10/17/2018   HGBA1C 5.8 10/17/2018   ECG today I have personally interpreted;  Sinus bradycardia 56 with frequency pvc usually bigeminy    Assessment & Plan:

## 2019-08-11 NOTE — Patient Instructions (Signed)
You will be contacted regarding the referral for: Cardiac Event monitor  Please continue all other medications as before, and refills have been done if requested.  Please have the pharmacy call with any other refills you may need.  Please continue your efforts at being more active, low cholesterol diet, and weight control.  Please keep your appointments with your specialists as you may have planned

## 2019-08-12 ENCOUNTER — Encounter: Payer: Self-pay | Admitting: Internal Medicine

## 2019-08-12 NOTE — Assessment & Plan Note (Addendum)
D/w pt, no sign of afib today, will cont same tx, but for cardiac event monitor for further evaluation  I spent 31 minutes in preparing to see the patient by review of recent labs, imaging and procedures, obtaining and reviewing separately obtained history, communicating with the patient and family or caregiver, ordering medications, tests or procedures, and documenting clinical information in the EHR including the differential Dx, treatment, and any further evaluation and other management of palipiations, sinus bradycardia, hyperglycemia, htn

## 2019-08-12 NOTE — Assessment & Plan Note (Signed)
stable overall by history and exam, recent data reviewed with pt, and pt to continue medical treatment as before,  to f/u any worsening symptoms or concerns  

## 2019-09-04 ENCOUNTER — Ambulatory Visit (INDEPENDENT_AMBULATORY_CARE_PROVIDER_SITE_OTHER): Payer: No Typology Code available for payment source | Admitting: Family Medicine

## 2019-09-04 ENCOUNTER — Ambulatory Visit: Payer: Self-pay

## 2019-09-04 ENCOUNTER — Other Ambulatory Visit: Payer: Self-pay

## 2019-09-04 ENCOUNTER — Encounter: Payer: Self-pay | Admitting: Family Medicine

## 2019-09-04 VITALS — BP 130/78 | Ht 71.0 in | Wt 196.4 lb

## 2019-09-04 DIAGNOSIS — M11261 Other chondrocalcinosis, right knee: Secondary | ICD-10-CM | POA: Diagnosis not present

## 2019-09-04 DIAGNOSIS — M25561 Pain in right knee: Secondary | ICD-10-CM | POA: Diagnosis not present

## 2019-09-04 MED ORDER — COLCHICINE 0.6 MG PO TABS: 0.6000 mg | ORAL_TABLET | Freq: Every day | ORAL | 2 refills | Status: DC | PRN

## 2019-09-04 NOTE — Patient Instructions (Addendum)
Thank you for coming in today. Call or go to the ER if you develop a large red swollen joint with extreme pain or oozing puss.   Recheck as needed.  Take the colchicine as needed for pseudogout.    Calcium Pyrophosphate Deposition Calcium pyrophosphate deposition (CPPD) is a type of arthritis that causes pain, swelling, and inflammation in a joint. Attacks of CPPD may come and go. The joint pain can be severe and may last for days to weeks. This condition usually affects one joint at a time. The knees are most often affected, but this condition can also affect the wrists, elbows, shoulders, or ankles. CPPD may also be called pseudogout because it is similar to gout. Both conditions result from the buildup of crystals in a joint. However, CPPD is caused by a type of crystal that is different from the crystals that cause gout. What are the causes? This condition is caused by the buildup of calcium pyrophosphate dihydrate crystals in a joint. The reason why this buildup occurs is not known. An increased likelihood of having this condition (predisposition) may be passed from parent to child (is hereditary). What increases the risk? This condition is more likely to develop in people who:  Are older than 73 years of age.  Have a family history of CPPD.  Have certain medical conditions, such as hemophilia, amyloidosis, or overactive parathyroid glands.  Have low levels of magnesium in the blood. What are the signs or symptoms? Symptoms of this condition include:  Joint pain. The pain may: ? Be intense and constant. ? Develop quickly. ? Get worse with movement. ? Last from several days to a few weeks.  Redness, swelling, stiffness, and warmth at the joint. How is this diagnosed? To diagnose this condition, your health care provider will use a needle to remove fluid from the joint. The fluid will be examined for the crystals that cause CPPD. You also may have additional tests, such  as:  Blood tests.  X-rays.  Ultrasound.  MRI. How is this treated? There is no way to remove the crystals from the joint and no cure for this condition. However, treatment can relieve symptoms and improve joint function. Treatment may include:  NSAIDs to reduce inflammation and pain.  Removing some of the fluid and crystals from around the joint with a needle.  Injections of medicine (cortisone) into the joint to reduce pain and swelling.  Medicines to help prevent attacks.  Physical therapy to improve joint function. Follow these instructions at home: Managing pain, stiffness, and swelling   Rest the affected joint until your symptoms start to go away.  If directed, put ice on the affected area to relieve pain and swelling: ? Put ice in a plastic bag. ? Place a towel between your skin and the bag. ? Leave the ice on for 20 minutes, 2-3 times a day.  Keep your affected joint raised (elevated) above the level of your heart, when possible. This will help to reduce swelling. For example, prop your foot up on a chair while sitting down to elevate your knee. General instructions  If the painful joint is in your leg, use crutches as told by your health care provider.  Take over-the-counter and prescription medicines only as told by your health care provider.  When your symptoms start to go away, begin to exercise regularly or do physical therapy. Talk with your health care provider or physical therapist about what types of exercise are safe for you. Exercise that  is easier on your joints (low-impact exercise) may be best. This includes walking, swimming, bicycling, and water aerobics.  Maintain a healthy weight. Excess weight puts stress on your joints.  Keep all follow-up visits as told by your health care provider and physical therapist. This is important. Contact a health care provider if you:  Notice that your symptoms get worse.  Develop a skin rash.  Notice that your  pain gets worse. Get help right away if you:  Have a fever.  Have difficulty breathing.  Are taking NSAIDs and you: ? Vomit blood. ? Have blood in your stool. ? Have stool that is tarry and black. Summary  Calcium pyrophosphate deposition (CPPD) is a type of arthritis that causes pain, swelling, and inflammation in a joint. The knees are most often affected, but it can also affect the wrists, elbows, shoulders, or ankles.  CPPD is caused by the buildup of calcium crystals in a joint. The reason why this occurs is not known.  Attacks of CPPD may come and go. The joint pain can be severe and may last for days to weeks.  There is no way to remove the crystals from the joint and no cure for this condition. However, treatment can relieve symptoms and improve joint function.  Rest the affected joint until your symptoms start to go away. After your symptoms go away, begin to exercise regularly or do physical therapy. This information is not intended to replace advice given to you by your health care provider. Make sure you discuss any questions you have with your health care provider. Document Revised: 12/25/2016 Document Reviewed: 11/10/2016 Elsevier Patient Education  2020 [employer].

## 2019-09-04 NOTE — Progress Notes (Signed)
I, Thomas Caldwell, LAT, ATC, am serving as scribe for Dr. [name].  Thomas Caldwell is a 74 y.o. male who presents to [facility] today for f/u of R lateral knee pain.  He was last seen by Dr. Katrinka Blazing on 03/23/19 and had a R knee injection.  He noted worsening pain w/ climbing stairs and w/ twisting/rotational activities.  Since his last visit w/ Dr. Katrinka Blazing, pt reports B knee pain, R>L.  He reports increasing R knee pain x 2 months.  He wears B knee sleeves consistently and also has some bigger braces.  He is having difficulty w/ sit-to-stand transitions.  Diagnostic testing: R knee XR- 03/23/19   Pertinent review of systems: No fevers or chills  Relevant historical information: No personal history of gout   Exam:  BP 130/78 (BP Location: Left Arm, Patient Position: Sitting, Cuff Size: Normal)   Ht 5\' 11"  (1.803 m)   Wt 196 lb 6.4 oz (89.1 kg)   BMI 27.39 kg/m  General: Well Developed, well nourished, and in no acute distress.   MSK: Right knee mild effusion normal-appearing otherwise Normal motion. Tender palpation mildly medial and lateral joint line. Stable ligamentous exam. Intact strength.    Lab and Radiology Results Diagnostic Limited MSK Ultrasound of: Right knee Quad tendon intact normal-appearing Small joint effusion present superior patellar space. Patellar tendon normal-appearing Medial and lateral joint line moderately narrowed right line and extruded meniscus. No Baker's cyst Impression: DJD with extruded menisci   Procedure: Real-time Ultrasound Guided Injection of right knee superior patellar space Device: Philips Affiniti 50G Images permanently stored and available for review in the ultrasound unit. Verbal informed consent obtained.  Discussed risks and benefits of procedure. Warned about infection bleeding damage to structures skin hypopigmentation and fat atrophy among others. Patient expresses understanding and  agreement Time-out conducted.   Noted no overlying erythema, induration, or other signs of local infection.   Skin prepped in a sterile fashion.   Local anesthesia: Topical Ethyl chloride.   With sterile technique and under real time ultrasound guidance:  40 mg of Kenalog and 2 mL of Marcaine injected easily.   Completed without difficulty   Pain immediately resolved suggesting accurate placement of the medication.   Advised to call if fevers/chills, erythema, induration, drainage, or persistent bleeding.   Images permanently stored and available for review in the ultrasound unit.  Impression: Technically successful ultrasound guided injection.    EXAM: RIGHT KNEE - COMPLETE 4+ VIEW  COMPARISON:  01/02/2013  FINDINGS: Progression of bilateral chondrocalcinosis. No significant joint space narrowing or effusion. Negative for fracture. Fabella and mild vascular calcification.  IMPRESSION: Progression of chondrocalcinosis. No joint space narrowing or effusion.   Electronically Signed   By: 14/08/2012 M.D.   On: 03/23/2019 15:39 I, 03/25/2019, personally (independently) visualized and performed the interpretation of the images attached in this note.     Assessment and Plan: 74 y.o. male with right knee pain probably due to DJD and extruded degenerative meniscus.  However patient had chondrocalcinosis seen on x-ray back in February.  I think he is pain is also due to a component of pseudogout.  Plan for steroid injection today as well as prescription for colchicine to use as needed.  Check back as needed.    Orders Placed This Encounter  Procedures  . March LIMITED JOINT SPACE STRUCTURES LOW RIGHT(NO LINKED CHARGES)    Order Specific Question:   Reason for Exam (SYMPTOM  OR  DIAGNOSIS REQUIRED)    Answer:   R knee pain    Order Specific Question:   Preferred imaging location?    Answer:   [facility] ordered this encounter   Medications  . colchicine 0.6 MG tablet    Sig: Take 1 tablet (0.6 mg total) by mouth daily as needed (knee pain and swelling).    Dispense:  30 tablet    Refill:  2     Discussed warning signs or symptoms. Please see discharge instructions. Patient expresses understanding.   The above documentation has been reviewed and is accurate and complete [name], M.D.

## 2019-09-18 ENCOUNTER — Encounter: Payer: Self-pay | Admitting: Internal Medicine

## 2019-09-18 ENCOUNTER — Ambulatory Visit (INDEPENDENT_AMBULATORY_CARE_PROVIDER_SITE_OTHER): Payer: No Typology Code available for payment source

## 2019-09-18 DIAGNOSIS — R002 Palpitations: Secondary | ICD-10-CM | POA: Diagnosis not present

## 2019-09-19 ENCOUNTER — Telehealth: Payer: Self-pay

## 2019-09-19 ENCOUNTER — Telehealth: Payer: Self-pay | Admitting: Cardiology

## 2019-09-19 DIAGNOSIS — I4729 Other ventricular tachycardia: Secondary | ICD-10-CM

## 2019-09-19 NOTE — Telephone Encounter (Signed)
Patient's critical reading report was faxed showing- Sinus Rhythm, Ventricular Tachycardia (36 sec)w/ MF PVCs (10)/ Artifact. Patient asymptomatic, seeing Dr. Graciela Husbands tomorrow.

## 2019-09-19 NOTE — Telephone Encounter (Signed)
Received the following fax reports from [facility]  1- 09/18/19 at 4:59 pm CT  Sinus Rhythm with Run of V-Tach (8 beats)/ Couplet PVCs/PVCs (10 in 1 min)/ Artifact   2- 09/18/19 at 6:35 pm CT  Sinus Rhythm with Run of V-Tach (8 beats)/ Couplet PVCs/PVCs (14 in 1 min) PACs/ Artifact  3- 09/18/19 at  7:48 pm CT  Sinus Rhythm Ventricular Tachycardia (8 sec) with Run of V-Tech (11 beats)/ Couplet PVCs/PVCs (17 in 1 min)/ Atrial Run.   Talked to patient. He stated he was asymptomatic at the time of these readings. Informed patient that our DOD, Dr. Mayford Knife wants him to have a new consult with Dr. Lalla Brothers for NSVT this week. Will place order for referral.

## 2019-09-19 NOTE — Telephone Encounter (Signed)
Patient called to return call to [facility]. Transferred call to [facility].

## 2019-09-19 NOTE — Telephone Encounter (Signed)
[facility] calling with a critical EKG. 

## 2019-09-20 ENCOUNTER — Ambulatory Visit (INDEPENDENT_AMBULATORY_CARE_PROVIDER_SITE_OTHER): Payer: No Typology Code available for payment source | Admitting: Internal Medicine

## 2019-09-20 ENCOUNTER — Other Ambulatory Visit: Payer: Self-pay

## 2019-09-20 ENCOUNTER — Telehealth: Payer: Self-pay | Admitting: Physician Assistant

## 2019-09-20 ENCOUNTER — Encounter: Payer: Self-pay | Admitting: Internal Medicine

## 2019-09-20 VITALS — BP 114/64 | HR 66 | Ht 71.0 in | Wt 195.0 lb

## 2019-09-20 DIAGNOSIS — I472 Ventricular tachycardia, unspecified: Secondary | ICD-10-CM

## 2019-09-20 MED ORDER — AMLODIPINE BESYLATE 5 MG PO TABS: 5.0000 mg | ORAL_TABLET | Freq: Every day | ORAL | 3 refills | Status: DC

## 2019-09-20 MED ORDER — METOPROLOL SUCCINATE ER 50 MG PO TB24: 50.0000 mg | ORAL_TABLET | Freq: Every day | ORAL | 3 refills | Status: DC

## 2019-09-20 MED ORDER — METOPROLOL SUCCINATE ER 25 MG PO TB24
ORAL_TABLET | ORAL | 3 refills | Status: DC
Start: 2019-09-20 — End: 2019-09-20

## 2019-09-20 NOTE — Telephone Encounter (Signed)
Paged by [facility].  Patient had 16 beats of nonsustained VT today at 3:22 central time.  Patient was seen by Dr. Graciela Husbands today.  Will fax strip for review.

## 2019-09-20 NOTE — Progress Notes (Signed)
ELECTROPHYSIOLOGY CONSULT NOTE  Patient ID: Thomas Caldwell, MRN: [id number], DOB/AGE: [birth year] 74 y.o. Admit date: (Not on file) Date of Consult: 09/20/2019  Primary Physician: Corwin Levins, MD Primary Cardiologist:  NEW     Thomas Caldwell is a 74 y.o. male who is being seen today for the evaluation of ABNORMAL MONITOR  at the request of Dr Lennon Alstrom.    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  He started initially just not feeling good.  He had occasional flutters and then noted alerts on his apple watch.  He became increasingly attuned to the association of the flutters and exercise intolerance and fatigue.  He would awaken at night with fluttering.  Episodes of the accompanied by shortness of breath but no chest pain.  Fatigue has been both acute with these events as well as generalized.  These flutters have become increasingly frequent over the last 6 months and increasing prolonged duration.  There is been a significant amount of psychosocial stress related to his furniture company as supply chain issues.  He has hypertension but no diabetes.  He has some sleep disordered breathing.  Because of the symptoms an event recorder was ordered.  It is incomplete that he was seen today because of abnormalities which are described below  Date Cr K Hgb  3/21 1.17 4.7 15.0             Past Medical History:  Diagnosis Date  . ALLERGIC RHINITIS 01/11/2008  . ANEURYSM 02/02/2007   brain  . BRADYCARDIA, CHRONIC 10/06/2007  . DIVERTICULOSIS, COLON 07/05/2007  . 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 GERD 07/05/2007  . HYPERLIPIDEMIA 02/02/2007  . HYPERTENSION 02/02/2007  . Lumbago 01/11/2008  . Peptic ulcer   . PERSISTENT DISORDER INITIATING/MAINTAINING SLEEP 02/02/2007      Surgical History:  Past Surgical History:  Procedure Laterality Date  . APPENDECTOMY  1982  . LUMBAR DISC SURGERY   12/2005   Dr. Venetia Maxon  . SAH Left brain  1992   due to brain aneurysm s/p repair     Home Meds: Current Meds  Medication Sig  . amLODipine (NORVASC) 10 MG tablet Take 1 tablet (10 mg total) by mouth daily.  Marland Kitchen aspirin 81 MG EC tablet Take 81 mg by mouth daily.    . colchicine 0.6 MG tablet Take 1 tablet (0.6 mg total) by mouth daily as needed (knee pain and swelling).  . Gabapentin Enacarbil ER (HORIZANT) 300 MG TBCR Take 300 mg by mouth at bedtime.  . irbesartan (AVAPRO) 300 MG tablet TAKE 1 TABLET(300 MG) BY MOUTH DAILY  . metoprolol succinate (TOPROL-XL) 25 MG 24 hr tablet TAKE 1 TABLET(25 MG) BY MOUTH DAILY  . naproxen (NAPROSYN) 500 MG tablet TAKE 1 TABLET(500 MG) BY MOUTH TWICE DAILY WITH A MEAL  . omeprazole (PRILOSEC) 20 MG capsule Take 2 capsules (40 mg total) by mouth daily.  . rosuvastatin (CRESTOR) 20 MG tablet TAKE 1 TABLET(20 MG) BY MOUTH DAILY    Allergies:  Allergies  Allergen Reactions  . Codeine     REACTION: confusion    Social History   Socioeconomic History  . Marital status: Married    Spouse name: Not on file  . Number of children: 2  . Years of education: Not on file  . Highest education level: Not on file  Occupational History  . Occupation: Physiological scientist  Employer: [employer]  Tobacco Use  . Smoking status: Never Smoker  . Smokeless tobacco: Never Used  Substance and Sexual Activity  . Alcohol use: Yes    Comment: 0-1 per day  . Drug use: No  . Sexual activity: Yes    Birth control/protection: None  Other Topics Concern  . Not on file  Social History Narrative  . Not on file   Social Determinants of Health   Financial Resource Strain:   . Difficulty of Paying Living Expenses: Not on file  Food Insecurity:   . Worried About Programme researcher, broadcasting/film/video in the Last Year: Not on file  . Ran Out of Food in the Last Year: Not on file  Transportation Needs:   . Lack of Transportation (Medical): Not on file  . Lack of Transportation  (Non-Medical): Not on file  Physical Activity:   . Days of Exercise per Week: Not on file  . Minutes of Exercise per Session: Not on file  Stress:   . Feeling of Stress : Not on file  Social Connections:   . Frequency of Communication with Friends and Family: Not on file  . Frequency of Social Gatherings with Friends and Family: Not on file  . Attends Religious Services: Not on file  . Active Member of Clubs or Organizations: Not on file  . Attends Banker Meetings: Not on file  . Marital Status: Not on file  Intimate Partner Violence:   . Fear of Current or Ex-Partner: Not on file  . Emotionally Abused: Not on file  . Physically Abused: Not on file  . Sexually Abused: Not on file     Family History  Problem Relation Age of Onset  . Heart disease Sister   . Hypertension Father   . Heart disease Father   . Hypertension Brother        3 bother with HTN  . Esophageal cancer Brother   . Colon cancer Neg Hx   . Rectal cancer Neg Hx   . Stomach cancer Neg Hx      ROS:  Please see the history of present illness.     All other systems reviewed and negative.    Physical Exam: Blood pressure 114/64, pulse 66, height 5\' 11"  (1.803 m), weight 195 lb (88.5 kg), SpO2 98 %. General: Well developed, well nourished male in no acute distress. Head: Normocephalic, atraumatic, sclera non-icteric, no xanthomas, nares are without discharge. EENT: normal  Lymph Nodes:  none Neck: Negative for carotid bruits. JVD not elevated. Back:without scoliosis kyphosis Lungs: Clear bilaterally to auscultation without wheezes, rales, or rhonchi. Breathing is unlabored. Heart: irregular RR with S1 S2. No  murmur . No rubs, or gallops appreciated. Abdomen: Soft, non-tender, non-distended with normoactive bowel sounds. No hepatomegaly. No rebound/guarding. No obvious abdominal masses. Msk:  Strength and tone appear normal for age. Extremities: No clubbing or cyanosis. No edema.  Distal pedal  pulses are 2+ and equal bilaterally. Skin: Warm and Dry Neuro: Alert and oriented X 3. CN III-XII intact Grossly normal sensory and motor function . Psych:  Responds to questions appropriately with a normal affect.      Labs: Cardiac Enzymes No results for input(s): CKTOTAL, CKMB, TROPONINI in the last 72 hours. CBC Lab Results  Component Value Date   WBC 6.5 04/05/2019   HGB 15.0 04/05/2019   HCT 44.6 04/05/2019   MCV 87.5 04/05/2019   PLT 199.0 04/05/2019   PROTIME: No results for input(s): LABPROT, INR  in the last 72 hours. Chemistry No results for input(s): NA, K, CL, CO2, BUN, CREATININE, CALCIUM, PROT, BILITOT, ALKPHOS, ALT, AST, GLUCOSE in the last 168 hours.  Invalid input(s): LABALBU Lipids Lab Results  Component Value Date   CHOL 151 10/17/2018   HDL 44.90 10/17/2018   LDLCALC 70 10/17/2018   TRIG 180.0 (H) 10/17/2018   BNP No results found for: PROBNP Thyroid Function Tests: No results for input(s): TSH, T4TOTAL, T3FREE, THYROIDAB in the last 72 hours.  Invalid input(s): FREET3 Miscellaneous No results found for: DDIMER  Radiology/Studies:  [place] LIMITED JOINT SPACE STRUCTURES LOW RIGHT(NO LINKED CHARGES)  Result Date: 09/14/2019 Diagnostic Limited MSK Ultrasound of: Right knee Quad tendon intact normal-appearing Small joint effusion present superior patellar space. Patellar tendon normal-appearing Medial and lateral joint line moderately narrowed right line and extruded meniscus. No Baker's cyst Impression: DJD with extruded menisci   Procedure: Real-time Ultrasound Guided Injection of right knee superior patellar space Device: Philips Affiniti 50G Images permanently stored and available for review in the ultrasound unit. Verbal informed consent obtained. Discussed risks and benefits of procedure. Warned about infection bleeding damage to structures skin hypopigmentation and fat atrophy among others. Patient expresses understanding and agreement Time-out  conducted.  Noted no overlying erythema, induration, or other signs of local infection.  Skin prepped in a sterile fashion.  Local anesthesia: Topical Ethyl chloride.  With sterile technique and under real time ultrasound guidance: 40 mg of Kenalog and 2 mL of Marcaine injected easily.  Completed without difficulty  Pain immediately resolved suggesting accurate placement of the medication.  Advised to call if fevers/chills, erythema, induration, drainage, or persistent bleeding.  Images permanently stored and available for review in the ultrasound unit. Impression: Technically successful ultrasound guided injection.   EKG: Sinus rhythm at 62 bpm Intervals 32/08/40 PVCs with a left bundle inferior axis morphology but a transition at lead V2   Assessment and Plan:  Atrial fibrillation  Ventricular tachycardia-nonsustained outflow tract possibly left ventricular  Exercise intolerance  Sinus bradycardia  Hypertension  Cerebral aneurysm   Patient has nonsustained atrial fibrillation on his monitor.  Also has nonsustained ventricular tachycardia as well as frequent ventricular ectopy.  Based on the twelve-lead electrocardiogram I suspect this is all emerging from the outflow tract.  This then is likely a benign mechanism of tachycardia; however, given the concomitant symptoms it becomes important to exclude structural heart disease.  Hence, we will undertake an echocardiogram to look at biventricular function as well as a Myoview scan to look for an ischemic substrate either as a cause or potential constraint on antiarrhythmic drug therapy.  Reviewed extensively the physiology.  His atrial fibrillation is relatively brief at this juncture; hence, anticoagulation is not yet indicated.  He has had some problem with sinus bradycardia.  We will try however to increase his metoprolol gently from 25--50 mg a day.  At the same time we will decrease his amlodipine from 10--5  We will  continue to monitor.  If his bradycardia becomes problematic he will let us know.    Sherryl Manges

## 2019-09-20 NOTE — Patient Instructions (Signed)
Medication Instructions:   ** Decrease Amlodipine to 5mg  - 1 tablet by mouth daily ** Increase Metoprolol Succinate to 50mg  - 1 tablet by mouth daily  *If you need a refill on your cardiac medications before your next appointment, please call your pharmacy*   Lab Work: None ordered.  If you have labs (blood work) drawn today and your tests are completely normal, you will receive your results only by: MyChart Message (if you have MyChart) OR . A paper copy in the mail If you have any lab test that is abnormal or we need to change your treatment, we will call you to review the results.   Testing/Procedures: Your physician has requested that you have an echocardiogram. Echocardiography is a painless test that uses sound waves to create images of your heart. It provides your doctor with information about the size and shape of your heart and how well your heart's chambers and valves are working. This procedure takes approximately one hour. There are no restrictions for this procedure.  Your physician has requested that you have a lexiscan myoview. For further information please visit . Please follow instruction sheet, as given.     Follow-Up: At [facility], you and your health needs are our priority.  As part of our continuing mission to provide you with exceptional heart care, we have created designated Provider Care Teams.  These Care Teams include your primary Cardiologist (physician) and Advanced Practice Providers (APPs -  Physician Assistants and Nurse Practitioners) who all work together to provide you with the care you need, when you need it.  We recommend signing up for the patient portal called "MyChart".  Sign up information is provided on this After Visit Summary.  MyChart is used to connect with patients for Virtual Visits (Telemedicine).  Patients are able to view lab/test results, encounter notes, upcoming appointments, etc.  Non-urgent messages can be  sent to your provider as well.   To learn more about what you can do with MyChart, go to [email or web address]    Your next appointment:

## 2019-09-20 NOTE — H&P (View-Only) (Signed)
ELECTROPHYSIOLOGY CONSULT NOTE  Patient ID: Thomas Caldwell, MRN: [id number], DOB/AGE: [birth year] 74 y.o. Admit date: (Not on file) Date of Consult: 09/20/2019  Primary Physician: Corwin Levins, MD Primary Cardiologist:  NEW     Thomas Caldwell is a 74 y.o. male who is being seen today for the evaluation of ABNORMAL MONITOR  at the request of Dr Lennon Alstrom.    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  He started initially just not feeling good.  He had occasional flutters and then noted alerts on his apple watch.  He became increasingly attuned to the association of the flutters and exercise intolerance and fatigue.  He would awaken at night with fluttering.  Episodes of the accompanied by shortness of breath but no chest pain.  Fatigue has been both acute with these events as well as generalized.  These flutters have become increasingly frequent over the last 6 months and increasing prolonged duration.  There is been a significant amount of psychosocial stress related to his furniture company as supply chain issues.  He has hypertension but no diabetes.  He has some sleep disordered breathing.  Because of the symptoms an event recorder was ordered.  It is incomplete that he was seen today because of abnormalities which are described below  Date Cr K Hgb  3/21 1.17 4.7 15.0             Past Medical History:  Diagnosis Date  . ALLERGIC RHINITIS 01/11/2008  . ANEURYSM 02/02/2007   brain  . BRADYCARDIA, CHRONIC 10/06/2007  . DIVERTICULOSIS, COLON 07/05/2007  . 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 GERD 07/05/2007  . HYPERLIPIDEMIA 02/02/2007  . HYPERTENSION 02/02/2007  . Lumbago 01/11/2008  . Peptic ulcer   . PERSISTENT DISORDER INITIATING/MAINTAINING SLEEP 02/02/2007      Surgical History:  Past Surgical History:  Procedure Laterality Date  . APPENDECTOMY  1982  . LUMBAR DISC SURGERY   12/2005   Dr. Venetia Maxon  . SAH Left brain  1992   due to brain aneurysm s/p repair     Home Meds: Current Meds  Medication Sig  . amLODipine (NORVASC) 10 MG tablet Take 1 tablet (10 mg total) by mouth daily.  Marland Kitchen aspirin 81 MG EC tablet Take 81 mg by mouth daily.    . colchicine 0.6 MG tablet Take 1 tablet (0.6 mg total) by mouth daily as needed (knee pain and swelling).  . Gabapentin Enacarbil ER (HORIZANT) 300 MG TBCR Take 300 mg by mouth at bedtime.  . irbesartan (AVAPRO) 300 MG tablet TAKE 1 TABLET(300 MG) BY MOUTH DAILY  . metoprolol succinate (TOPROL-XL) 25 MG 24 hr tablet TAKE 1 TABLET(25 MG) BY MOUTH DAILY  . naproxen (NAPROSYN) 500 MG tablet TAKE 1 TABLET(500 MG) BY MOUTH TWICE DAILY WITH A MEAL  . omeprazole (PRILOSEC) 20 MG capsule Take 2 capsules (40 mg total) by mouth daily.  . rosuvastatin (CRESTOR) 20 MG tablet TAKE 1 TABLET(20 MG) BY MOUTH DAILY    Allergies:  Allergies  Allergen Reactions  . Codeine     REACTION: confusion    Social History   Socioeconomic History  . Marital status: Married    Spouse name: Not on file  . Number of children: 2  . Years of education: Not on file  . Highest education level: Not on file  Occupational History  . Occupation: Physiological scientist  Employer: [employer]  Tobacco Use  . Smoking status: Never Smoker  . Smokeless tobacco: Never Used  Substance and Sexual Activity  . Alcohol use: Yes    Comment: 0-1 per day  . Drug use: No  . Sexual activity: Yes    Birth control/protection: None  Other Topics Concern  . Not on file  Social History Narrative  . Not on file   Social Determinants of Health   Financial Resource Strain:   . Difficulty of Paying Living Expenses: Not on file  Food Insecurity:   . Worried About Running Out of Food in the Last Year: Not on file  . Ran Out of Food in the Last Year: Not on file  Transportation Needs:   . Lack of Transportation (Medical): Not on file  . Lack of Transportation  (Non-Medical): Not on file  Physical Activity:   . Days of Exercise per Week: Not on file  . Minutes of Exercise per Session: Not on file  Stress:   . Feeling of Stress : Not on file  Social Connections:   . Frequency of Communication with Friends and Family: Not on file  . Frequency of Social Gatherings with Friends and Family: Not on file  . Attends Religious Services: Not on file  . Active Member of Clubs or Organizations: Not on file  . Attends Club or Organization Meetings: Not on file  . Marital Status: Not on file  Intimate Partner Violence:   . Fear of Current or Ex-Partner: Not on file  . Emotionally Abused: Not on file  . Physically Abused: Not on file  . Sexually Abused: Not on file     Family History  Problem Relation Age of Onset  . Heart disease Sister   . Hypertension Father   . Heart disease Father   . Hypertension Brother        3 bother with HTN  . Esophageal cancer Brother   . Colon cancer Neg Hx   . Rectal cancer Neg Hx   . Stomach cancer Neg Hx      ROS:  Please see the history of present illness.     All other systems reviewed and negative.    Physical Exam: Blood pressure 114/64, pulse 66, height 5' 11" (1.803 m), weight 195 lb (88.5 kg), SpO2 98 %. General: Well developed, well nourished male in no acute distress. Head: Normocephalic, atraumatic, sclera non-icteric, no xanthomas, nares are without discharge. EENT: normal  Lymph Nodes:  none Neck: Negative for carotid bruits. JVD not elevated. Back:without scoliosis kyphosis Lungs: Clear bilaterally to auscultation without wheezes, rales, or rhonchi. Breathing is unlabored. Heart: irregular RR with S1 S2. No  murmur . No rubs, or gallops appreciated. Abdomen: Soft, non-tender, non-distended with normoactive bowel sounds. No hepatomegaly. No rebound/guarding. No obvious abdominal masses. Msk:  Strength and tone appear normal for age. Extremities: No clubbing or cyanosis. No edema.  Distal pedal  pulses are 2+ and equal bilaterally. Skin: Warm and Dry Neuro: Alert and oriented X 3. CN III-XII intact Grossly normal sensory and motor function . Psych:  Responds to questions appropriately with a normal affect.      Labs: Cardiac Enzymes No results for input(s): CKTOTAL, CKMB, TROPONINI in the last 72 hours. CBC Lab Results  Component Value Date   WBC 6.5 04/05/2019   HGB 15.0 04/05/2019   HCT 44.6 04/05/2019   MCV 87.5 04/05/2019   PLT 199.0 04/05/2019   PROTIME: No results for input(s): LABPROT, INR   in the last 72 hours. Chemistry No results for input(s): NA, K, CL, CO2, BUN, CREATININE, CALCIUM, PROT, BILITOT, ALKPHOS, ALT, AST, GLUCOSE in the last 168 hours.  Invalid input(s): LABALBU Lipids Lab Results  Component Value Date   CHOL 151 10/17/2018   HDL 44.90 10/17/2018   LDLCALC 70 10/17/2018   TRIG 180.0 (H) 10/17/2018   BNP No results found for: PROBNP Thyroid Function Tests: No results for input(s): TSH, T4TOTAL, T3FREE, THYROIDAB in the last 72 hours.  Invalid input(s): FREET3 Miscellaneous No results found for: DDIMER  Radiology/Studies:  [place] LIMITED JOINT SPACE STRUCTURES LOW RIGHT(NO LINKED CHARGES)  Result Date: 09/14/2019 Diagnostic Limited MSK Ultrasound of: Right knee Quad tendon intact normal-appearing Small joint effusion present superior patellar space. Patellar tendon normal-appearing Medial and lateral joint line moderately narrowed right line and extruded meniscus. No Baker's cyst Impression: DJD with extruded menisci   Procedure: Real-time Ultrasound Guided Injection of right knee superior patellar space Device: Philips Affiniti 50G Images permanently stored and available for review in the ultrasound unit. Verbal informed consent obtained. Discussed risks and benefits of procedure. Warned about infection bleeding damage to structures skin hypopigmentation and fat atrophy among others. Patient expresses understanding and agreement Time-out  conducted.  Noted no overlying erythema, induration, or other signs of local infection.  Skin prepped in a sterile fashion.  Local anesthesia: Topical Ethyl chloride.  With sterile technique and under real time ultrasound guidance: 40 mg of Kenalog and 2 mL of Marcaine injected easily.  Completed without difficulty  Pain immediately resolved suggesting accurate placement of the medication.  Advised to call if fevers/chills, erythema, induration, drainage, or persistent bleeding.  Images permanently stored and available for review in the ultrasound unit. Impression: Technically successful ultrasound guided injection.   EKG: Sinus rhythm at 62 bpm Intervals 32/08/40 PVCs with a left bundle inferior axis morphology but a transition at lead V2   Assessment and Plan:  Atrial fibrillation  Ventricular tachycardia-nonsustained outflow tract possibly left ventricular  Exercise intolerance  Sinus bradycardia  Hypertension  Cerebral aneurysm   Patient has nonsustained atrial fibrillation on his monitor.  Also has nonsustained ventricular tachycardia as well as frequent ventricular ectopy.  Based on the twelve-lead electrocardiogram I suspect this is all emerging from the outflow tract.  This then is likely a benign mechanism of tachycardia; however, given the concomitant symptoms it becomes important to exclude structural heart disease.  Hence, we will undertake an echocardiogram to look at biventricular function as well as a Myoview scan to look for an ischemic substrate either as a cause or potential constraint on antiarrhythmic drug therapy.  Reviewed extensively the physiology.  His atrial fibrillation is relatively brief at this juncture; hence, anticoagulation is not yet indicated.  He has had some problem with sinus bradycardia.  We will try however to increase his metoprolol gently from 25--50 mg a day.  At the same time we will decrease his amlodipine from 10--5  We will  continue to monitor.  If his bradycardia becomes problematic he will let us know.    Sherryl Manges

## 2019-09-21 ENCOUNTER — Other Ambulatory Visit: Payer: Self-pay

## 2019-09-21 ENCOUNTER — Telehealth: Payer: Self-pay | Admitting: Internal Medicine

## 2019-09-21 MED ORDER — METOPROLOL SUCCINATE ER 50 MG PO TB24
50.0000 mg | ORAL_TABLET | Freq: Every day | ORAL | 3 refills | Status: DC
Start: 2019-09-21 — End: 2020-04-15

## 2019-09-21 NOTE — Telephone Encounter (Signed)
Spoke to the patient about two episodes picked up on his heart monitor.   1. 09/20/19 4:05pm  CST  Ventricular tachy 7 secs with 10 PVC's    Asymptomatic  2.  09/21/19 7:26 am CST  SB with a run of V- tach 8 beats, 2 PVC's   Symptoms: felt "fluttering in chest"  Advised patient to keep monitoring and I will give the strips to Dr. Graciela Husbands this afternoon when he is in the office. IF any changes need to be made I would contact him.

## 2019-09-21 NOTE — Telephone Encounter (Signed)
Called and left voicemail for patient to call office in regards to the heart monitor he is wearing.

## 2019-09-21 NOTE — Telephone Encounter (Signed)
     I went in pt's chart to see who called him today. It was [place],

## 2019-09-21 NOTE — Telephone Encounter (Signed)
Byrd Hesselbach calling to report at 7:35AM CST, pt was in sinus brady at 55bpm and then converted to VT for 8 beats with a rate of 159.  She is sending monitor strip over.

## 2019-09-21 NOTE — Telephone Encounter (Signed)
Byrd Hesselbach, from [facility] calling with a critical EKG.

## 2019-09-23 ENCOUNTER — Telehealth: Payer: Self-pay | Admitting: Internal Medicine

## 2019-09-24 ENCOUNTER — Telehealth: Payer: Self-pay | Admitting: Physician Assistant

## 2019-09-24 ENCOUNTER — Telehealth: Payer: Self-pay | Admitting: Internal Medicine

## 2019-09-24 NOTE — Telephone Encounter (Signed)
Returned call to [facility] for 5 runs of NSVT in 1 minute, longest run was 15 beats at 4:05 pm. They could not reach him by phone, but event was an autodetection. Strips have been sent to the office.   I attempted to call the patient using both phone numbers and left messages to call if he is having symptoms.

## 2019-09-25 ENCOUNTER — Telehealth: Payer: Self-pay

## 2019-09-25 NOTE — Telephone Encounter (Signed)
Thomas Caldwell with [facility] is requesting to report critical EKG results.

## 2019-09-25 NOTE — Telephone Encounter (Signed)
Patient continues to have reports of Non sustained VT.  Madeline with [facility] reported that at 9:00 am CT he had 11 beats, at 12:00 pm CT he had 9 beats, 1:00 pm CT he had 18 beats, and at 3:00 pm CT he had 8 beats. Left message for patient to call back if he is having any symptoms. Will wait for fax of strips to have DOD review.

## 2019-09-25 NOTE — Telephone Encounter (Signed)
Received strips from [facility] on the pts current monitor:   Day 6 of 21: 09/23/19 3-9 beat run of VT, couplet PVC's  Day 7: 3-15 beats runs of VT with several couplets  Spoke with the pt that is currently out of town and he reports that on Saturday 09/23/19 he had some mild dizziness but no palpitations and feeling well otherwise.   He is having an Echo and stress test per Dr. Graciela Husbands 10/04/19.   After talking with the DOD, Dr. Lalla Brothers and reviewing Dr. Odessa Fleming last note pt to continue to monitor and will call if he develops any new or worsening symptoms.   Will place strips in the Med Rec box to be scanned into the pts chart for Dr. Odessa Fleming review.

## 2019-09-26 ENCOUNTER — Telehealth: Payer: Self-pay | Admitting: Internal Medicine

## 2019-09-26 NOTE — Telephone Encounter (Deleted)
2 more [facility] recordings given to triage on this pt. Events were both on 8/31 at 0717 CST and 8/31 at 0803 CST,   On 09/26/19 at 0717 CST pt had an auto-triggered event showing V-tach (10 beats), sinus brady w/MF PVCs (14 in 1 min)/PACs. Pts HR was 152 bpm.   On 09/26/19 at 0803 CST pt had an auto-triggered event showing sinus rhythm, sinus arrhythmia w/run of V-Tach (8 beats)/ couplet PVCs/MF PVCs (9 in 1 min).  Pts HR was 152 bpm.

## 2019-09-26 NOTE — Telephone Encounter (Signed)
[facility] called re monitor  6 sec of ventricular tachycardia at 172 bpm   12:07 eastern time. Underlying rhyth SR with 17 pvcs per 1 minute Also atrial runs and PACs   Pt asymptomatic   Auto-detected.  Tried to reach pt but they did not answer   Latest report at 12:15   SB with 4 beats of VT   Will forward to Dr Mayford Knife Dr Graciela Husbands   Pt with known NSVT.  Dietrich Pates MD

## 2019-09-26 NOTE — Telephone Encounter (Signed)
See telephone encounter regarding this information from 09/25/19.

## 2019-09-26 NOTE — Telephone Encounter (Signed)
Pt seen 8/25

## 2019-09-26 NOTE — Telephone Encounter (Addendum)
Strips from [facility] received this morning on the following episodes endorsed in this message by triage RN Antonietta Breach.   3 more new events since then that [facility] called in and faxed over on this pt is from 8/30 at 11:08 pm CST and 8/31 at 1202 CST, 8/31 at 0717 CST, and 8/31 at 0803 CST.   On 09/25/19 at 11:08 pm CST pt had an auto-triggered event showing sinus arrhythmia, V-TACH (6 sec) w/couplet PVCs/PVCs (17 in 1 min)/ atrial run/PACs.  Pts HR at this time was 172 bpm.   On 09/26/19 at 1202 CST pt had an auto triggered event showing sinus brady, idoventricular rhythm w/run of V-tach (11 and 5 beats)/couplet PVCS/PVCS (12 in 1 min)/ Ventricular escape beat/atrial run/couplet PACs.  Pts HR at this time was 163 bpm.    On 09/26/19 at 0717 CST pt had an auto-triggered event showing V-tach (10 beats), sinus brady w/MF PVCs (14 in 1 min)/PACs. Pts HR was 152 bpm.   On 09/26/19 at 0803 CST pt had an auto-triggered event showing sinus rhythm, sinus arrhythmia w/run of V-Tach (8 beats)/ couplet PVCs/MF PVCs (9 in 1 min).  Pts HR was 152 bpm.   Noted that Dr. Graciela Husbands tried calling the pt earlier this morning and he did not answer. I called the pt and he answered.  He states he has been in between flights since yesterday and today.  Pt states he apologized for missing Dr. Odessa Fleming call this AM, but he said Dr. Graciela Husbands left him a message stating he would give him a call later on today.   Informed the pt that Dr. Graciela Husbands will be in our office today, for afternoon clinic, and I will let him know that we spoke and that he will anticipate a follow-up call from Dr. Graciela Husbands later on today, when he gets a chance to call the pt back.  Pt states he will not be available to talk after 4 pm, for he will be on a flight from [place] to [place].   Did ask the pt if he had any symptoms during all recorded events we have received from his monitor.  Pt states that the only symptoms he has experienced thus far, is occasional fluttering in  his chest.  Pt states he has no other cardiac complaints like chest pain, sob, dizziness, lightheadedness, pre-syncopal or syncopal episodes.  Pt states he feels like all he needs is some med adjustments to be made, and that will correct his issue.  Informed the pt that I will take his recordings to Dr. Graciela Husbands to further review and advise on, upon return to our office this afternoon.  I will also make Dr. Graciela Husbands aware that the only cardiac symptoms he feels is fluttering.  Informed the pt that either Dr. Graciela Husbands or a triage Nurse will call him back, once further advisement is provided.  Pt verbalized understanding and agrees with this plan.

## 2019-09-26 NOTE — Telephone Encounter (Signed)
Showed all [facility] recordings on the pt to Dr. Graciela Husbands, his EP Physician.  Dr. Graciela Husbands reviewed readings and signed accordingly. Per Dr. Graciela Husbands, based on his recordings, the pt should keep his scheduled echo and myoview for 10/04/19 at our office, so that we can rule out CAD and/or any underlying heart disease, so that we can treat him accordingly, being his nSVT is new onset for him.Marland Kitchen  Spoke with the pt and highly reiterated to him the importance for him to come to his scheduled echo and myoview on 10/04/19 per Dr. Graciela Husbands.  Pt verbalized understanding and agrees with this plan.  Pt states he will be at all his test scheduled with our office.  Monitor recordings placed in med rec box to be scanned.

## 2019-09-26 NOTE — Telephone Encounter (Signed)
Loa Socks, LPN at 04/27/270 10:27 AM  Status: Signed    Strips from [facility] received this morning on the following episodes endorsed in this message by triage RN Antonietta Breach.   2 more new events since then that [facility] called in and faxed over on this pt is from 8/30 at 11:08 pm CST and 8/31 at 1202 CST.   On 09/25/19 at 11:08 pm CST pt had an auto-triggered event showing sinus arrhythmia, V-TACH (6 sec) w/couplet PVCs/PVCs (17 in 1 min)/ atrial run/PACs.  Pts HR at this time was 172 bpm.   On 09/26/19 at 1202 CST pt had an auto triggered event showing sinus brady, idoventricular rhythm w/run of V-tach (11 and 5 beats)/couplet PVCS/PVCS (12 in 1 min)/ Ventricular escape beat/atrial run/couplet PACs.  Pts HR at this time was 163 bpm.    Noted that Dr. Graciela Husbands tried calling the pt earlier this morning and he did not answer. I called the pt and he answered.  He states he has been in between flights since yesterday and today.  Pt states he apologized for missing Dr. Odessa Fleming call this AM, but he said Dr. Graciela Husbands left him a message stating he would give him a call later on today.   Informed the pt that Dr. Graciela Husbands will be in our office today, for afternoon clinic, and I will let him know that we spoke and that he will anticipate a follow-up call from Dr. Graciela Husbands later on today, when he gets a chance to call the pt back.  Pt states he will not be available to talk after 4 pm, for he will be on a flight from [place] to [place].   Did ask the pt if he had any symptoms during all recorded events we have received from his monitor.  Pt states that the only symptoms he has experienced thus far, is occasional fluttering in his chest.  Pt states he has no other cardiac complaints like chest pain, sob, dizziness, lightheadedness, pre-syncopal or syncopal episodes.  Pt states he feels like all he needs is some med adjustments to be made, and that will correct his issue.  Informed the pt that I will take his recordings  to Dr. Graciela Husbands to further review and advise on, upon return to our office this afternoon.  I will also make Dr. Graciela Husbands aware that the only cardiac symptoms he feels is fluttering.  Informed the pt that either Dr. Graciela Husbands or a triage Nurse will call him back, once further advisement is provided.  Pt verbalized understanding and agrees with this plan.

## 2019-09-26 NOTE — Telephone Encounter (Signed)
Called mobile and no answer  Will call work

## 2019-09-26 NOTE — Telephone Encounter (Signed)
Please see additional open encounter regarding this information.

## 2019-09-26 NOTE — Telephone Encounter (Signed)
[facility] called stating they will be faxing over some recordings on this pt showing nSVT.  [facility] states they are all auto-triggered events.  Triage to await for reports to come through and give to Dr. Graciela Husbands for further review, when he returns to the office this afternoon.

## 2019-09-27 ENCOUNTER — Encounter (HOSPITAL_COMMUNITY): Payer: Self-pay | Admitting: *Deleted

## 2019-09-27 ENCOUNTER — Telehealth: Payer: Self-pay | Admitting: *Deleted

## 2019-09-27 ENCOUNTER — Telehealth (HOSPITAL_COMMUNITY): Payer: Self-pay | Admitting: *Deleted

## 2019-09-27 NOTE — Telephone Encounter (Addendum)
[facility] sent 3 critical readings from 09/26/19 1) At 12:22 pm CST sinus arrhythmia w?Run of V Tach (9-12 beats) couplets PVCs/(19 in 1 min) 2)1:01 pm CST Sinus Arrhythmia Sinus Rhythm w/Run of V tach (3-11 beats) Couplets PVCs (20 in 1 min)/trigeminal PVCs/couplet PACs IVCD . 3) Sinus arrhythmia w/Run of Vtach (9 beats) PVCs (15 in 1 min)/couplet PACs/PACs/arttifact . Spoke with pt and did feel uncomfortable during these episodes Pt thinks was at airport  Will forward to Dr Graciela Husbands for review Zack Seal

## 2019-09-27 NOTE — Telephone Encounter (Signed)
Patient given detailed instructions per Myocardial Perfusion Study Information Sheet for the test on 10/04/2019 at 0715. Patient notified to arrive 15 minutes early and that it is imperative to arrive on time for appointment to keep from having the test rescheduled.  If you need to cancel or reschedule your appointment, please call the office within 24 hours of your appointment. . Patient verbalized understanding.Cola Gane, Adelene Idler

## 2019-09-27 NOTE — Telephone Encounter (Signed)
Left message on voicemail in reference to upcoming appointment scheduled for 10/04/2019 at 0715. Phone number given for a call back so details instructions can be given. Ivania Teagarden, Adelene Idler Mychart letter sent with instructions

## 2019-09-28 ENCOUNTER — Telehealth: Payer: Self-pay

## 2019-09-28 ENCOUNTER — Telehealth: Payer: Self-pay | Admitting: *Deleted

## 2019-09-28 NOTE — Telephone Encounter (Signed)
[facility] sent critical notification from 09/28/19 at 11:34 CST  Sinus rhythm w/Run of vtach (11 beats) /Couplet PVCs/mf pvcs 914 in 1 min)/artifact Per pt noted chest pain right sided and pushed record ./cy

## 2019-09-28 NOTE — Telephone Encounter (Signed)
Pt's monitor report alerted for 10 beat run of VT and multi-focal PVC's at 4:33am today. Pt stated he felt the event and believes it woke him up this morning. He denied any CP, SOB or dizziness during the event.   Will have monitor scanned in and forward to Dr. Graciela Husbands to review.

## 2019-09-29 NOTE — Telephone Encounter (Signed)
Discussed monitor findings with Dr Graciela Husbands Per Dr Graciela Husbands pt should go to ED for eval these recordings are worrisome and need further evaluation./cy

## 2019-09-29 NOTE — Telephone Encounter (Signed)
Pt aware of recommendations and declines ED at this time.Encouraged pt if has worsening S/S to go to ED Pt verbalized understanding Per pt had no episodes last night to pt's knowledge ./cy

## 2019-10-03 ENCOUNTER — Other Ambulatory Visit: Payer: Self-pay | Admitting: Internal Medicine

## 2019-10-03 ENCOUNTER — Other Ambulatory Visit (HOSPITAL_COMMUNITY): Payer: No Typology Code available for payment source

## 2019-10-03 ENCOUNTER — Telehealth: Payer: Self-pay | Admitting: *Deleted

## 2019-10-03 ENCOUNTER — Other Ambulatory Visit (HOSPITAL_COMMUNITY)
Admission: RE | Admit: 2019-10-03 | Discharge: 2019-10-03 | Disposition: A | Discharge status: Home / Self Care | Payer: 59 | Source: Ambulatory Visit | Attending: Cardiovascular Disease | Admitting: Cardiovascular Disease

## 2019-10-03 DIAGNOSIS — Z20822 Contact with and (suspected) exposure to covid-19: Secondary | ICD-10-CM | POA: Insufficient documentation

## 2019-10-03 DIAGNOSIS — Z01812 Encounter for preprocedural laboratory examination: Secondary | ICD-10-CM | POA: Insufficient documentation

## 2019-10-03 DIAGNOSIS — I472 Ventricular tachycardia, unspecified: Secondary | ICD-10-CM

## 2019-10-03 LAB — SARS CORONAVIRUS 2 (TAT 6-24 HRS): SARS Coronavirus 2: NEGATIVE

## 2019-10-03 MED ORDER — SODIUM CHLORIDE 0.9% FLUSH: 3.0000 mL | Freq: Two times a day (BID) | INTRAVENOUS | Status: DC

## 2019-10-03 NOTE — Telephone Encounter (Signed)
Spoke with pt and advised per Dr Graciela Husbands pt scheduled for Left Heart Cath 10/04/2019.  Reviewed pt instruction letter with pt.  See letter for complete details.  Pt verbalizes understanding and agrees with current plan.

## 2019-10-03 NOTE — Telephone Encounter (Signed)
Cardiac Reports Day 12 - critical auto trigger 09/29/19 1:31 pm, sinus rhythm, VT (6 sec) w/ run of VT (Multiple Runs)/couplet PVCs/PVCs (19 in 1 min) --------------------------------------------------------------------------------------------- Day 13 - critical auto trigger 09/30/19 2:39 pm, sinus bradycardia, VT (6 sec) w run of vt (5,3 beats)/couplet PVCs/MF PVCs (16 in 1 min) ------------------------------------------------------------------------------------------------- Day 14 - critical auto trigger 10/01/19 9:35 pm, sinus bradycardia w run of VT (3-10 beats)/couplet PVCs/MF PVCs (14 in 1 min) Day 14 - critical auto trigger 10/01/19 11:28 am, sinus rhythm w run of VT (multiple runs)/couplet PVCs/MF PVCs (25 in 1 min) Day 14 - critical, patient triggered for chest pain or pressure 10/01/19 11:58 am, sinus rhythm w run of VT (Multiple Runs)/couplet PVCs/MF PVCs (11 in 1 min) ------------------------------------------------------------------------------------------------------- Day 15 - critical auto trigger, 10/02/19 9:40 pm, sinus bradycardia w run of VT (3-8 beats)/Couplet PVCs/MF PVCs (20 in 1 min) Couplet PACs/PACs Day 15 - critical auto trigger, 10/02/19 2:58 pm, sinus rhythm, VT (6 sec) w run of VT (4-6 beats)/couplet PVCs/PVCs (19 in 1 min)/PACs   Spoke w patient he feels fluttering at times in his chest, same symptoms.  No chest pain or pressure.  Continued last night.  Adv I have not gotten those reports yet but will watch for those.  Echo and myovue tomorrow. Copy of reports given to Dr. Odessa Fleming nurse for his review this afternoon.

## 2019-10-04 ENCOUNTER — Encounter (HOSPITAL_COMMUNITY)
Admission: RE | Disposition: A | Discharge status: Home / Self Care | Payer: Self-pay | Source: Home / Self Care | Attending: Cardiovascular Disease

## 2019-10-04 ENCOUNTER — Ambulatory Visit (HOSPITAL_COMMUNITY)
Admission: RE | Admit: 2019-10-04 | Discharge: 2019-10-04 | Disposition: A | Discharge status: Home / Self Care | Payer: PRIVATE HEALTH INSURANCE | Attending: Cardiovascular Disease | Admitting: Cardiovascular Disease

## 2019-10-04 ENCOUNTER — Ambulatory Visit (HOSPITAL_BASED_OUTPATIENT_CLINIC_OR_DEPARTMENT_OTHER): Payer: PRIVATE HEALTH INSURANCE

## 2019-10-04 ENCOUNTER — Other Ambulatory Visit (HOSPITAL_COMMUNITY): Payer: No Typology Code available for payment source

## 2019-10-04 ENCOUNTER — Encounter (HOSPITAL_COMMUNITY): Payer: Self-pay | Admitting: Cardiovascular Disease

## 2019-10-04 ENCOUNTER — Other Ambulatory Visit: Payer: Self-pay

## 2019-10-04 ENCOUNTER — Telehealth: Payer: Self-pay | Admitting: *Deleted

## 2019-10-04 ENCOUNTER — Ambulatory Visit (HOSPITAL_COMMUNITY): Payer: No Typology Code available for payment source

## 2019-10-04 DIAGNOSIS — Z885 Allergy status to narcotic agent status: Secondary | ICD-10-CM | POA: Diagnosis not present

## 2019-10-04 DIAGNOSIS — I472 Ventricular tachycardia, unspecified: Secondary | ICD-10-CM

## 2019-10-04 DIAGNOSIS — I671 Cerebral aneurysm, nonruptured: Secondary | ICD-10-CM | POA: Diagnosis not present

## 2019-10-04 DIAGNOSIS — G4730 Sleep apnea, unspecified: Secondary | ICD-10-CM | POA: Insufficient documentation

## 2019-10-04 DIAGNOSIS — Z7982 Long term (current) use of aspirin: Secondary | ICD-10-CM | POA: Diagnosis not present

## 2019-10-04 DIAGNOSIS — I10 Essential (primary) hypertension: Secondary | ICD-10-CM | POA: Insufficient documentation

## 2019-10-04 DIAGNOSIS — E785 Hyperlipidemia, unspecified: Secondary | ICD-10-CM | POA: Insufficient documentation

## 2019-10-04 DIAGNOSIS — R5383 Other fatigue: Secondary | ICD-10-CM | POA: Insufficient documentation

## 2019-10-04 DIAGNOSIS — I361 Nonrheumatic tricuspid (valve) insufficiency: Secondary | ICD-10-CM

## 2019-10-04 DIAGNOSIS — I2510 Atherosclerotic heart disease of native coronary artery without angina pectoris: Secondary | ICD-10-CM

## 2019-10-04 DIAGNOSIS — I428 Other cardiomyopathies: Secondary | ICD-10-CM

## 2019-10-04 DIAGNOSIS — Z79899 Other long term (current) drug therapy: Secondary | ICD-10-CM | POA: Diagnosis not present

## 2019-10-04 DIAGNOSIS — R001 Bradycardia, unspecified: Secondary | ICD-10-CM | POA: Insufficient documentation

## 2019-10-04 DIAGNOSIS — K5790 Diverticulosis of intestine, part unspecified, without perforation or abscess without bleeding: Secondary | ICD-10-CM | POA: Insufficient documentation

## 2019-10-04 DIAGNOSIS — I4891 Unspecified atrial fibrillation: Secondary | ICD-10-CM | POA: Insufficient documentation

## 2019-10-04 DIAGNOSIS — I340 Nonrheumatic mitral (valve) insufficiency: Secondary | ICD-10-CM | POA: Diagnosis not present

## 2019-10-04 DIAGNOSIS — K219 Gastro-esophageal reflux disease without esophagitis: Secondary | ICD-10-CM | POA: Diagnosis not present

## 2019-10-04 HISTORY — PX: LEFT HEART CATH AND CORONARY ANGIOGRAPHY: CATH118249

## 2019-10-04 LAB — CBC
HCT: 45.6 % (ref 39.0–52.0)
Hemoglobin: 15.2 g/dL (ref 13.0–17.0)
MCH: 29.2 pg (ref 26.0–34.0)
MCHC: 33.3 g/dL (ref 30.0–36.0)
MCV: 87.5 fL (ref 80.0–100.0)
Platelets: 203 10*3/uL (ref 150–400)
RBC: 5.21 MIL/uL (ref 4.22–5.81)
RDW: 13.8 % (ref 11.5–15.5)
WBC: 6.5 10*3/uL (ref 4.0–10.5)
nRBC: 0 % (ref 0.0–0.2)

## 2019-10-04 LAB — POCT I-STAT, CHEM 8
BUN: 27 mg/dL — ABNORMAL HIGH (ref 8–23)
Calcium, Ion: 1.21 mmol/L (ref 1.15–1.40)
Chloride: 108 mmol/L (ref 98–111)
Creatinine, Ser: 0.9 mg/dL (ref 0.61–1.24)
Glucose, Bld: 107 mg/dL — ABNORMAL HIGH (ref 70–99)
HCT: 42 % (ref 39.0–52.0)
Hemoglobin: 14.3 g/dL (ref 13.0–17.0)
Potassium: 4.2 mmol/L (ref 3.5–5.1)
Sodium: 144 mmol/L (ref 135–145)
TCO2: 23 mmol/L (ref 22–32)

## 2019-10-04 LAB — BASIC METABOLIC PANEL
Anion gap: 9 (ref 5–15)
BUN: 30 mg/dL — ABNORMAL HIGH (ref 8–23)
CO2: 22 mmol/L (ref 22–32)
Calcium: 9.3 mg/dL (ref 8.9–10.3)
Chloride: 106 mmol/L (ref 98–111)
Creatinine, Ser: 1.04 mg/dL (ref 0.61–1.24)
GFR calc Af Amer: >60 mL/min (ref >60)
GFR calc non Af Amer: >60 mL/min (ref >60)
Glucose, Bld: 102 mg/dL — ABNORMAL HIGH (ref 70–99)
Potassium: 5.5 mmol/L — ABNORMAL HIGH (ref 3.5–5.1)
Sodium: 137 mmol/L (ref 135–145)

## 2019-10-04 LAB — ECHOCARDIOGRAM COMPLETE
Area-P 1/2: 3.72 cm2
Height: 71 in
S' Lateral: 5.3 cm
Weight: 3008 oz

## 2019-10-04 SURGERY — LEFT HEART CATH AND CORONARY ANGIOGRAPHY
Anesthesia: LOCAL

## 2019-10-04 MED ORDER — ENTRESTO 49-51 MG PO TABS: 1.0000 | ORAL_TABLET | Freq: Two times a day (BID) | ORAL | 3 refills | Status: DC

## 2019-10-04 MED ORDER — HEPARIN SODIUM (PORCINE) 1000 UNIT/ML IJ SOLN
INTRAMUSCULAR | Status: AC
  Filled 2019-10-04: qty 1

## 2019-10-04 MED ORDER — FENTANYL CITRATE (PF) 100 MCG/2ML IJ SOLN
INTRAMUSCULAR | Status: DC | PRN
Start: 2019-10-04 — End: 2019-10-04
  Administered 2019-10-04: 25 ug via INTRAVENOUS

## 2019-10-04 MED ORDER — IOHEXOL 350 MG/ML SOLN
INTRAVENOUS | Status: DC | PRN
  Administered 2019-10-04: 70 mL

## 2019-10-04 MED ORDER — SODIUM CHLORIDE 0.9 % WEIGHT BASED INFUSION: 1.0000 mL/kg/h | INTRAVENOUS | Status: DC

## 2019-10-04 MED ORDER — SODIUM CHLORIDE 0.9% FLUSH: 3.0000 mL | INTRAVENOUS | Status: DC | PRN

## 2019-10-04 MED ORDER — ASPIRIN 81 MG PO CHEW: 81.0000 mg | CHEWABLE_TABLET | ORAL | Status: DC

## 2019-10-04 MED ORDER — MIDAZOLAM HCL 2 MG/2ML IJ SOLN
INTRAMUSCULAR | Status: AC
  Filled 2019-10-04: qty 2

## 2019-10-04 MED ORDER — MIDAZOLAM HCL 2 MG/2ML IJ SOLN
INTRAMUSCULAR | Status: DC | PRN
  Administered 2019-10-04: 2 mg via INTRAVENOUS

## 2019-10-04 MED ORDER — SODIUM CHLORIDE 0.9 % IV SOLN: 250.0000 mL | INTRAVENOUS | Status: DC | PRN

## 2019-10-04 MED ORDER — SODIUM CHLORIDE 0.9 % WEIGHT BASED INFUSION
3.0000 mL/kg/h | INTRAVENOUS | Status: AC
  Administered 2019-10-04: 3 mL/kg/h via INTRAVENOUS

## 2019-10-04 MED ORDER — HEPARIN (PORCINE) IN NACL 1000-0.9 UT/500ML-% IV SOLN
INTRAVENOUS | Status: DC | PRN
  Administered 2019-10-04 (×2): 500 mL

## 2019-10-04 MED ORDER — LIDOCAINE HCL (PF) 1 % IJ SOLN
INTRAMUSCULAR | Status: DC | PRN
  Administered 2019-10-04: 2 mL

## 2019-10-04 MED ORDER — LIDOCAINE HCL (PF) 1 % IJ SOLN
INTRAMUSCULAR | Status: AC
  Filled 2019-10-04: qty 30

## 2019-10-04 MED ORDER — FLECAINIDE ACETATE 100 MG PO TABS: 100.0000 mg | ORAL_TABLET | Freq: Two times a day (BID) | ORAL | 3 refills | Status: DC

## 2019-10-04 MED ORDER — VERAPAMIL HCL 2.5 MG/ML IV SOLN
INTRAVENOUS | Status: DC | PRN
  Administered 2019-10-04: 10 mL via INTRA_ARTERIAL

## 2019-10-04 MED ORDER — HEPARIN (PORCINE) IN NACL 1000-0.9 UT/500ML-% IV SOLN
INTRAVENOUS | Status: AC
  Filled 2019-10-04: qty 1000

## 2019-10-04 MED ORDER — HEPARIN SODIUM (PORCINE) 1000 UNIT/ML IJ SOLN
INTRAMUSCULAR | Status: DC | PRN
  Administered 2019-10-04: 4300 [IU] via INTRAVENOUS

## 2019-10-04 MED ORDER — VERAPAMIL HCL 2.5 MG/ML IV SOLN
INTRAVENOUS | Status: AC
  Filled 2019-10-04: qty 2

## 2019-10-04 MED ORDER — FENTANYL CITRATE (PF) 100 MCG/2ML IJ SOLN
INTRAMUSCULAR | Status: AC
  Filled 2019-10-04: qty 2

## 2019-10-04 SURGICAL SUPPLY — 11 items
CATH INFINITI JR4 5F (CATHETERS) ×1 IMPLANT
CATH OPTITORQUE TIG 4.0 5F (CATHETERS) ×1 IMPLANT
DEVICE RAD COMP TR BAND LRG (VASCULAR PRODUCTS) ×1 IMPLANT
ELECT DEFIB PAD ADLT CADENCE (PAD) ×1 IMPLANT
GLIDESHEATH SLEND SS 6F .021 (SHEATH) ×1 IMPLANT
GUIDEWIRE INQWIRE 1.5J.035X260 (WIRE) IMPLANT
INQWIRE 1.5J .035X260CM (WIRE) ×2
KIT HEART LEFT (KITS) ×2 IMPLANT
PACK CARDIAC CATHETERIZATION (CUSTOM PROCEDURE TRAY) ×2 IMPLANT
TRANSDUCER W/STOPCOCK (MISCELLANEOUS) ×2 IMPLANT
TUBING CIL FLEX 10 FLL-RA (TUBING) ×2 IMPLANT

## 2019-10-04 NOTE — Interval H&P Note (Signed)
Cath Lab Visit (complete for each Cath Lab visit)  Clinical Evaluation Leading to the Procedure:   ACS: No.  Non-ACS:    Anginal Classification: CCS II  Anti-ischemic medical therapy: Maximal Therapy (2 or more classes of medications)  Non-Invasive Test Results: No non-invasive testing performed  Prior CABG: No previous CABG      History and Physical Interval Note:  10/04/2019 10:32 AM  Thomas Caldwell  has presented today for surgery, with the diagnosis of vt.  The various methods of treatment have been discussed with the patient and family. After consideration of risks, benefits and other options for treatment, the patient has consented to  Procedure(s): LEFT HEART CATH AND CORONARY ANGIOGRAPHY (N/A) as a surgical intervention.  The patient's history has been reviewed, patient examined, no change in status, stable for surgery.  I have reviewed the patient's chart and labs.  Questions were answered to the patient's satisfaction.     Thomas Caldwell

## 2019-10-04 NOTE — Discharge Instructions (Signed)
Radial Site Care  This sheet gives you information about how to care for yourself after your procedure. Your health care provider may also give you more specific instructions. If you have problems or questions, contact your health care provider. What can I expect after the procedure? After the procedure, it is common to have:  Bruising and tenderness at the catheter insertion area. Follow these instructions at home: Medicines  Take over-the-counter and prescription medicines only as told by your health care provider. Insertion site care  Follow instructions from your health care provider about how to take care of your insertion site. Make sure you: ? Wash your hands with soap and water before you change your bandage (dressing). If soap and water are not available, use hand sanitizer. ? Change your dressing as told by your health care provider. ? Leave stitches (sutures), skin glue, or adhesive strips in place. These skin closures may need to stay in place for 2 weeks or longer. If adhesive strip edges start to loosen and curl up, you may trim the loose edges. Do not remove adhesive strips completely unless your health care provider tells you to do that.  Check your insertion site every day for signs of infection. Check for: ? Redness, swelling, or pain. ? Fluid or blood. ? Pus or a bad smell. ? Warmth.  Do not take baths, swim, or use a hot tub until your health care provider approves.  You may shower 24-48 hours after the procedure, or as directed by your health care provider. ? Remove the dressing and gently wash the site with plain soap and water. ? Pat the area dry with a clean towel. ? Do not rub the site. That could cause bleeding.  Do not apply powder or lotion to the site. Activity   For 24 hours after the procedure, or as directed by your health care provider: ? Do not flex or bend the affected arm. ? Do not push or pull heavy objects with the affected arm. ? Do not  drive yourself home from the hospital or clinic. You may drive 24 hours after the procedure unless your health care provider tells you not to. ? Do not operate machinery or power tools.  Do not lift anything that is heavier than 10 lb (4.5 kg), or the limit that you are told, until your health care provider says that it is safe.  Ask your health care provider when it is okay to: ? Return to work or school. ? Resume usual physical activities or sports. ? Resume sexual activity. General instructions  If the catheter site starts to bleed, raise your arm and put firm pressure on the site. If the bleeding does not stop, get help right away. This is a medical emergency.  If you went home on the same day as your procedure, a responsible adult should be with you for the first 24 hours after you arrive home.  Keep all follow-up visits as told by your health care provider. This is important. Contact a health care provider if:  You have a fever.  You have redness, swelling, or yellow drainage around your insertion site. Get help right away if:  You have unusual pain at the radial site.  The catheter insertion area swells very fast.  The insertion area is bleeding, and the bleeding does not stop when you hold steady pressure on the area.  Your arm or hand becomes pale, cool, tingly, or numb. These symptoms may represent a serious problem   that is an emergency. Do not wait to see if the symptoms will go away. Get medical help right away. Call your local emergency services (911 in the [place] Do not drive yourself to the hospital. Summary  After the procedure, it is common to have bruising and tenderness at the site.  Follow instructions from your health care provider about how to take care of your radial site wound. Check the wound every day for signs of infection.  Do not lift anything that is heavier than 10 lb (4.5 kg), or the limit that you are told, until your health care provider says  that it is safe. This information is not intended to replace advice given to you by your health care provider. Make sure you discuss any questions you have with your health care provider. Document Revised: 02/17/2017 Document Reviewed: 02/17/2017 Elsevier Patient Education  2020 [employer]  

## 2019-10-04 NOTE — Addendum Note (Signed)
Addended by: Alois Cliche on: 10/04/2019 02:49 PM   Modules accepted: Orders

## 2019-10-04 NOTE — Progress Notes (Signed)
Discussed patient with Dr. Graciela Husbands re: VT.   Will stop irbesartan and change to Entresto 49/51 mg BID starting TOMORROW am.   Start Flecainide 100 mg BID cautiously in setting of frequent VT. Further discussion pending note from Dr. Graciela Husbands in this patient with LVEF 30-35%.   Pt given 30 day card for [facility] and instructions on enrolling.   Pt will have virtual visit with Dr. Graciela Husbands 9/16, Thursday at 230.   Casimiro Needle [address] [place], PA-C  10/04/2019 1:37 PM

## 2019-10-04 NOTE — Progress Notes (Signed)
  Echocardiogram 2D Echocardiogram has been performed.  Elysa Womac G Ladarious Kresse 10/04/2019, 1:26 PM

## 2019-10-04 NOTE — Progress Notes (Signed)
       Patient Name: Thomas Caldwell      SUBJECTIVE:   Seen following catheterization for frequent repetitive ventricular ectopy.  Catheterization demonstrated nonobstructive disease Hand - LV gram was about 25% echo is pending.  Continues with symptoms  Past Medical History:  Diagnosis Date  . ALLERGIC RHINITIS 01/11/2008  . ANEURYSM 02/02/2007   brain  . BRADYCARDIA, CHRONIC 10/06/2007  . DIVERTICULOSIS, COLON 07/05/2007  . 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 GERD 07/05/2007  . HYPERLIPIDEMIA 02/02/2007  . HYPERTENSION 02/02/2007  . Lumbago 01/11/2008  . Peptic ulcer   . PERSISTENT DISORDER INITIATING/MAINTAINING SLEEP 02/02/2007    Scheduled Meds:  Scheduled Meds: . aspirin  81 mg Oral Pre-Cath  . sodium chloride flush  3 mL Intravenous Q12H   Continuous Infusions: . sodium chloride    . sodium chloride     sodium chloride, sodium chloride flush    PHYSICAL EXAM Vitals:   10/04/19 1253 10/04/19 1319 10/04/19 1340 10/04/19 1354  BP: 134/76 (!) 147/76 (!) 144/74 106/65  Pulse:      Resp:      Temp:      TempSrc:      SpO2: 98% 99% 100% 98%  Weight:      Height:       Well developed and nourished in no acute distress HENT normal Neck supple with JVP  Clear Regular rate and rhythm, no murmurs or gallops Abd-soft with active BS No Clubbing cyanosis edema Skin-warm and dry A & Oriented  Grossly normal sensory and motor function  ECG sinus with frequent ventricular ectopy left bundle inferior axis transition V2-V3    No intake or output data in the 24 hours ending 10/04/19 1517  LABS: Basic Metabolic Panel: Recent Labs  Lab 10/04/19 0843 10/04/19 1052  NA 137 144  K 5.5* 4.2  CL 106 108  CO2 22  --   GLUCOSE 102* 107*  BUN 30* 27*  CREATININE 1.04 0.90  CALCIUM 9.3  --    Cardiac Enzymes: No results for input(s): CKTOTAL, CKMB, CKMBINDEX, TROPONINI in the last 72 hours. 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  The first is the causal relationship between the cardiomyopathy and the ectopy.  In the event that the ectopy is primary, obliterating the ectopic beats, diminishing the burden would be anticipated be associated with improvement in LV function.  In the event that the cardiomyopathy is primary, limiting the PVCs is of benefit for symptoms.  Given his cardiomyopathy, we will begin Entresto, stopping his irbesartan.  I have discussed with Dr. Fawn Kirk the role of ablation.  Discuss  also with Dr. Merita Norton and consideration of primary ablative therapy versus medical therapy.  In the interim, given symptoms, based on data from [place], will use flecainide not withstanding his cardiomyopathy as a bridge to decision making.  I discussed with him and his wife.     Signed, Sherryl Manges MD  10/04/2019

## 2019-10-04 NOTE — Telephone Encounter (Signed)
Received 2 [employer] monitor reports:  9/7 9:59 pm - auto triggered run of VT, 9 beats, couplet PVC  9/8 6:04 am - auto triggered run of VT, 7 beats Per monitor reports, yesterday, pt was scheduled for cath today. Nothing further to do, at this time, with monitor findings.

## 2019-10-05 ENCOUNTER — Telehealth: Payer: Self-pay | Admitting: *Deleted

## 2019-10-05 ENCOUNTER — Other Ambulatory Visit: Payer: Self-pay | Admitting: Internal Medicine

## 2019-10-05 NOTE — Telephone Encounter (Signed)
Cardiac Report [employer] Monitor Day 18. 10/05/19 8:05 am.  * beats of Vtach w sinus rhythm, rate 161.  To Dr. Odessa Fleming nurse to make aware.

## 2019-10-05 NOTE — Telephone Encounter (Signed)
Looks like this was stopped

## 2019-10-09 ENCOUNTER — Telehealth: Payer: Self-pay | Admitting: Internal Medicine

## 2019-10-09 NOTE — Telephone Encounter (Signed)
Monitor strip obtained.  Pt had a recording showing AF with sustained run of VT and PVCs.  Rate was 118bpm.  Spoke with pt and he denies any symptoms.  Advised I will speak with Dr. Graciela Husbands and call back if any recommendations, otherwise, keep plan for televisit on Thursday.  Spoke with Dr. Graciela Husbands and he said no changes.

## 2019-10-12 ENCOUNTER — Telehealth: Payer: Self-pay

## 2019-10-12 ENCOUNTER — Other Ambulatory Visit: Payer: Self-pay

## 2019-10-12 ENCOUNTER — Telehealth (INDEPENDENT_AMBULATORY_CARE_PROVIDER_SITE_OTHER): Payer: 59 | Admitting: Internal Medicine

## 2019-10-12 VITALS — BP 128/75 | HR 59 | Ht 71.0 in | Wt 188.0 lb

## 2019-10-12 DIAGNOSIS — I472 Ventricular tachycardia, unspecified: Secondary | ICD-10-CM

## 2019-10-12 DIAGNOSIS — R001 Bradycardia, unspecified: Secondary | ICD-10-CM

## 2019-10-12 NOTE — Progress Notes (Signed)
Electrophysiology TeleHealth Note   Due to national recommendations of social distancing due to COVID 19, an audio/video telehealth visit is felt to be most appropriate for this patient at this time.  See MyChart message from today for the patient's consent to telehealth for [facility].   Date:  10/12/2019   ID:  Thomas Caldwell, DOB 1945-02-27, MRN [id number]  Location: patient's home  Provider location: [address] [place]  Evaluation Performed: Follow-up visit  PCP:  Corwin Levins, MD  Cardiologist:    Electrophysiologist:  SK   Chief Complaint:  VT  History of Present Illness:    Thomas Caldwell is a 74 y.o. male who presents via audio/video conferencing for a telehealth visit today.  Since last being seen in our clinic for PVC of high density and intercurrent Cath >> no obstructive disease but EF about 30-35% with LVE for which he was then started on flecainide, the patient reports doing much better,  Scant palps  No dyspnea on exertion edema,nocturnal dyspnea    The patient denies symptoms of fevers, chills, cough, or new SOB worrisome for COVID 19.    Past Medical History:  Diagnosis Date  . ALLERGIC RHINITIS 01/11/2008  . ANEURYSM 02/02/2007   brain  . BRADYCARDIA, CHRONIC 10/06/2007  . DIVERTICULOSIS, COLON 07/05/2007  . 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 GERD 07/05/2007  . HYPERLIPIDEMIA 02/02/2007  . HYPERTENSION 02/02/2007  . Lumbago 01/11/2008  . Peptic ulcer   . PERSISTENT DISORDER INITIATING/MAINTAINING SLEEP 02/02/2007    Past Surgical History:  Procedure Laterality Date  . APPENDECTOMY  1982  . LEFT HEART CATH AND CORONARY ANGIOGRAPHY N/A 10/04/2019   Procedure: LEFT HEART CATH AND CORONARY ANGIOGRAPHY;  Surgeon: Lennette Bihari, MD;  Location: [facility] INVASIVE CV LAB;  Service: Cardiovascular;  Laterality: N/A;  . LUMBAR DISC SURGERY  12/2005   Dr. Venetia Maxon  . SAH Left brain  1992   due to brain aneurysm s/p repair    Current  Outpatient Medications  Medication Sig Dispense Refill  . amLODipine (NORVASC) 10 MG tablet TAKE 1 TABLET(10 MG) BY MOUTH DAILY 90 tablet 2  . aspirin 81 MG EC tablet Take 81 mg by mouth daily.      . colchicine 0.6 MG tablet Take 1 tablet (0.6 mg total) by mouth daily as needed (knee pain and swelling). 30 tablet 2  . flecainide (TAMBOCOR) 100 MG tablet Take 1 tablet (100 mg total) by mouth 2 (two) times daily. 60 tablet 3  . metoprolol succinate (TOPROL-XL) 50 MG 24 hr tablet Take 1 tablet (50 mg total) by mouth daily. Take with or immediately following a meal. 90 tablet 3  . naproxen (NAPROSYN) 500 MG tablet TAKE 1 TABLET(500 MG) BY MOUTH TWICE DAILY WITH A MEAL 60 tablet 2  . omeprazole (PRILOSEC) 20 MG capsule Take 2 capsules (40 mg total) by mouth daily. (Patient taking differently: Take 20 mg by mouth at bedtime as needed (acid reflux). ) 180 capsule 3  . rosuvastatin (CRESTOR) 20 MG tablet TAKE 1 TABLET(20 MG) BY MOUTH DAILY 90 tablet 2  . sacubitril-valsartan (ENTRESTO) 49-51 MG Take 1 tablet by mouth 2 (two) times daily. 60 tablet 3   Current Facility-Administered Medications  Medication Dose Route Frequency Provider Last Rate Last Admin  . sodium chloride flush (NS) 0.9 % injection 3 mL  3 mL Intravenous Q12H Duke Salvia, MD        Allergies:   Codeine   Social  History:  The patient  reports that he has never smoked. He has never used smokeless tobacco. He reports current alcohol use. He reports that he does not use drugs.   Family History:  The patient's   family history includes Esophageal cancer in his brother; Heart disease in his father and sister; Hypertension in his brother and father.   ROS:  Please see the history of present illness.   All other systems are personally reviewed and negative.    Exam:    Vital Signs:  BP 128/75   Pulse (!) 59   Ht 5\' 11"  (1.803 m)   Wt 188 lb (85.3 kg)   SpO2 95%   BMI 26.22 kg/m     Well appearing, alert and conversant,  regular work of breathing,  good skin color Eyes- anicteric, neuro- grossly intact, skin- no apparent rash or lesions or cyanosis, mouth- oral mucosa is pink   Labs/Other Tests and Data Reviewed:    Recent Labs: 10/17/2018: TSH 4.40 04/05/2019: ALT 31 10/04/2019: BUN 27; Creatinine, Ser 0.90; Hemoglobin 14.3; Platelets 203; Potassium 4.2; Sodium 144   Wt Readings from Last 3 Encounters:  10/12/19 188 lb (85.3 kg)  10/04/19 188 lb (85.3 kg)  09/20/19 195 lb (88.5 kg)     Other studies personally reviewed: Additional studies/ records that were reviewed today include:     ASSESSMENT & PLAN:    Ventricular tachycardia Nonsustained  Cardiomyopathy  irritability    VT burden notably less problematic on flecainide.  It is being used during this trial period, to suppress, which will need to be clarified by monitoring depending on burden of current monitor and then either continue vs ablation or stop vs ablation fi cardiomyopathy persists  Decreased LV function on toprol and entresto--continue  Discussed the importance of the opportunity to address irritablitiy  COVID 19 screen The patient denies symptoms of COVID 19 at this time.  The importance of social distancing was discussed today.  Follow-up:  28m    Current medicines are reviewed at length with the patient today.   The patient does not have concerns regarding his medicines.  The following changes were made today:  none  Labs/ tests ordered today include: zio patch3 weeks for 3 days cMRI end of Nov No orders of the defined types were placed in this encounter.   Future tests ( post COVID )     Patient Risk:  after full review of this patients clinical status, I feel that they are at moderate risk at this time.  Today, I have spent  15  minutes with the patient with telehealth technology discussing the above.  Signed, Dec, MD  10/12/2019 3:14 PM     [facility] [address] [place] [phone] (office) [phone] (fax)

## 2019-10-12 NOTE — Telephone Encounter (Signed)
  Patient Consent for Virtual Visit         Thomas Caldwell has provided verbal consent on 10/12/2019 for a virtual visit (video or telephone).   CONSENT FOR VIRTUAL VISIT FOR:  Thomas Caldwell  By participating in this virtual visit I agree to the following:  I hereby voluntarily request, consent and authorize [facility] and its employed or contracted physicians, physician assistants, nurse practitioners or other licensed health care professionals (the Practitioner), to provide me with telemedicine health care services (the "Services") as deemed necessary by the treating Practitioner. I acknowledge and consent to receive the Services by the Practitioner via telemedicine. I understand that the telemedicine visit will involve communicating with the Practitioner through live audiovisual communication technology and the disclosure of certain medical information by electronic transmission. I acknowledge that I have been given the opportunity to request an in-person assessment or other available alternative prior to the telemedicine visit and am voluntarily participating in the telemedicine visit.  I understand that I have the right to withhold or withdraw my consent to the use of telemedicine in the course of my care at any time, without affecting my right to future care or treatment, and that the Practitioner or I may terminate the telemedicine visit at any time. I understand that I have the right to inspect all information obtained and/or recorded in the course of the telemedicine visit and may receive copies of available information for a reasonable fee.  I understand that some of the potential risks of receiving the Services via telemedicine include:  Marland Kitchen Delay or interruption in medical evaluation due to technological equipment failure or disruption; . Information transmitted may not be sufficient (e.g. poor resolution of images) to allow for appropriate medical decision making by the  Practitioner; and/or  . In rare instances, security protocols could fail, causing a breach of personal health information.  Furthermore, I acknowledge that it is my responsibility to provide information about my medical history, conditions and care that is complete and accurate to the best of my ability. I acknowledge that Practitioner's advice, recommendations, and/or decision may be based on factors not within their control, such as incomplete or inaccurate data provided by me or distortions of diagnostic images or specimens that may result from electronic transmissions. I understand that the practice of medicine is not an exact science and that Practitioner makes no warranties or guarantees regarding treatment outcomes. I acknowledge that a copy of this consent can be made available to me via my patient portal [facility] MyChart), or I can request a printed copy by calling the office of [facility].    I understand that my insurance will be billed for this visit.   I have read or had this consent read to me. . I understand the contents of this consent, which adequately explains the benefits and risks of the Services being provided via telemedicine.  . I have been provided ample opportunity to ask questions regarding this consent and the Services and have had my questions answered to my satisfaction. . I give my informed consent for the services to be provided through the use of telemedicine in my medical care

## 2019-10-12 NOTE — Patient Instructions (Signed)
Medication Instructions:  Your physician recommends that you continue on your current medications as directed. Please refer to the Current Medication list given to you today.  *If you need a refill on your cardiac medications before your next appointment, please call your pharmacy*   Lab Work: None ordered.  If you have labs (blood work) drawn today and your tests are completely normal, you will receive your results only by: Marland Kitchen MyChart Message (if you have MyChart) OR . A paper copy in the mail If you have any lab test that is abnormal or we need to change your treatment, we will call you to review the results.   Testing/Procedures: Cardiac MRI and 3 day ZIO XT monitor   Follow-Up: At [facility], you and your health needs are our priority.  As part of our continuing mission to provide you with exceptional heart care, we have created designated Provider Care Teams.  These Care Teams include your primary Cardiologist (physician) and Advanced Practice Providers (APPs -  Physician Assistants and Nurse Practitioners) who all work together to provide you with the care you need, when you need it.  We recommend signing up for the patient portal called "MyChart".  Sign up information is provided on this After Visit Summary.  MyChart is used to connect with patients for Virtual Visits (Telemedicine).  Patients are able to view lab/test results, encounter notes, upcoming appointments, etc.  Non-urgent messages can be sent to your provider as well.   To learn more about what you can do with MyChart, go to [email or web address].    Your next appointment:   3 month(s)  The format for your next appointment:   In Person  Provider:   Sherryl Manges, MD   Other Instructions Thomas Caldwell- Long Term Monitor Instructions   Your physician has requested you wear your ZIO patch monitor___3___days.   This is a single patch monitor.  [employer] supplies one patch monitor per enrollment.  Additional stickers  are not available.   Please do not apply patch if you will be having a Nuclear Stress Test, Echocardiogram, Cardiac CT, MRI, or Chest Xray during the time frame you would be wearing the monitor. The patch cannot be worn during these tests.  You cannot remove and re-apply the ZIO XT patch monitor.   Your ZIO patch monitor will be sent USPS Priority mail from [facility] directly to your home address. The monitor may also be mailed to a PO BOX if home delivery is not available.   It may take 3-5 days to receive your monitor after you have been enrolled.   Once you have received you monitor, please review enclosed instructions.  Your monitor has already been registered assigning a specific monitor serial # to you.   Applying the monitor   Shave hair from upper left chest.   Hold abrader disc by orange tab.  Rub abrader in 40 strokes over left upper chest as indicated in your monitor instructions.   Clean area with 4 enclosed alcohol pads .  Use all pads to assure are is cleaned thoroughly.  Let dry.   Apply patch as indicated in monitor instructions.  Patch will be place under collarbone on left side of chest with arrow pointing upward.   Rub patch adhesive wings for 2 minutes.Remove white label marked "1".  Remove white label marked "2".  Rub patch adhesive wings for 2 additional minutes.   While looking in a mirror, press and release button in center of patch.  A small green light will flash 3-4 times .  This will be your only indicator the monitor has been turned on.     Do not shower for the first 24 hours.  You may shower after the first 24 hours.   Press button if you feel a symptom. You will hear a small click.  Record Date, Time and Symptom in the Patient Log Book.   When you are ready to remove patch, follow instructions on last 2 pages of Patient Log Book.  Stick patch monitor onto last page of Patient Log Book.   Place Patient Log Book in [place] box.  Use locking tab on box  and tape box closed securely.  The [employer] and [employer] has [employer] on it.  Please place in mailbox as soon as possible.  Your physician should have your test results approximately 7 days after the monitor has been mailed back to [facility].   Call [facility] Customer Care at [phone] if you have questions regarding your ZIO XT patch monitor.  Call them immediately if you see an orange light blinking on your monitor.   If your monitor falls off in less than 4 days contact our Monitor department at [phone].  If your monitor becomes loose or falls off after 4 days call [employer] at [phone] for suggestions on securing your monitor.

## 2019-10-13 ENCOUNTER — Telehealth: Payer: Self-pay | Admitting: Internal Medicine

## 2019-10-13 ENCOUNTER — Telehealth: Payer: Self-pay | Admitting: Radiology

## 2019-10-13 NOTE — Telephone Encounter (Signed)
Spoke with patient regarding preferred weekdays and times of day for the Cardiac MRI ordered by Dr. Graciela Husbands.  Informed patient as soon as we obtain insurance prior authorizaiton, we will be in contact with his appointment.  He voiced his understanding.Marland Kitchen

## 2019-10-13 NOTE — Telephone Encounter (Signed)
Enrolled patient for a 3 day Zio XT to be mailed to patients home.  

## 2019-10-18 ENCOUNTER — Ambulatory Visit (INDEPENDENT_AMBULATORY_CARE_PROVIDER_SITE_OTHER): Payer: 59

## 2019-10-18 ENCOUNTER — Encounter: Payer: Self-pay | Admitting: Internal Medicine

## 2019-10-18 DIAGNOSIS — R001 Bradycardia, unspecified: Secondary | ICD-10-CM

## 2019-10-18 DIAGNOSIS — I472 Ventricular tachycardia, unspecified: Secondary | ICD-10-CM

## 2019-10-18 NOTE — Telephone Encounter (Signed)
Patient returned my call to discuss the appointment for Cardiac MRI scheduled Monday 11/13/19 at 8:00 am at [facility]---arrival time is 7:45 am --1st floor admissions office.  Will mail information to patient and it is also available in My Chart.  Patient voiced his understanding

## 2019-10-18 NOTE — Telephone Encounter (Signed)
Left message for patient to call regarding appointment for the Cardiac MRI scheduled Monday 11/13/19 at 8:00 am at [facility].

## 2019-10-19 ENCOUNTER — Encounter: Payer: No Typology Code available for payment source | Admitting: Internal Medicine

## 2019-11-01 ENCOUNTER — Other Ambulatory Visit (INDEPENDENT_AMBULATORY_CARE_PROVIDER_SITE_OTHER): Payer: 59

## 2019-11-01 DIAGNOSIS — R739 Hyperglycemia, unspecified: Secondary | ICD-10-CM | POA: Diagnosis not present

## 2019-11-01 DIAGNOSIS — Z0000 Encounter for general adult medical examination without abnormal findings: Secondary | ICD-10-CM | POA: Diagnosis not present

## 2019-11-01 LAB — CBC WITH DIFFERENTIAL/PLATELET
Basophils Absolute: 0.1 10*3/uL (ref 0.0–0.1)
Basophils Relative: 0.6 % (ref 0.0–3.0)
Eosinophils Absolute: 0.2 10*3/uL (ref 0.0–0.7)
Eosinophils Relative: 2.1 % (ref 0.0–5.0)
HCT: 43.2 % (ref 39.0–52.0)
Hemoglobin: 14.4 g/dL (ref 13.0–17.0)
Lymphocytes Relative: 16.7 % (ref 12.0–46.0)
Lymphs Abs: 1.5 10*3/uL (ref 0.7–4.0)
MCHC: 33.4 g/dL (ref 30.0–36.0)
MCV: 88.9 fl (ref 78.0–100.0)
Monocytes Absolute: 1.2 10*3/uL — ABNORMAL HIGH (ref 0.1–1.0)
Monocytes Relative: 13.5 % — ABNORMAL HIGH (ref 3.0–12.0)
Neutro Abs: 5.9 10*3/uL (ref 1.4–7.7)
Neutrophils Relative %: 67.1 % (ref 43.0–77.0)
Platelets: 185 10*3/uL (ref 150.0–400.0)
RBC: 4.86 Mil/uL (ref 4.22–5.81)
RDW: 14.2 % (ref 11.5–15.5)
WBC: 8.9 10*3/uL (ref 4.0–10.5)

## 2019-11-01 LAB — PSA: PSA: 1.12 ng/mL (ref 0.10–4.00)

## 2019-11-01 LAB — LIPID PANEL
Cholesterol: 143 mg/dL (ref 0–200)
HDL: 45.4 mg/dL (ref >39.00)
LDL Cholesterol: 82 mg/dL (ref 0–99)
NonHDL: 97.81
Total CHOL/HDL Ratio: 3
Triglycerides: 79 mg/dL (ref 0.0–149.0)
VLDL: 15.8 mg/dL (ref 0.0–40.0)

## 2019-11-01 LAB — URINALYSIS, ROUTINE W REFLEX MICROSCOPIC
Bilirubin Urine: NEGATIVE
Hgb urine dipstick: NEGATIVE
Ketones, ur: NEGATIVE
Leukocytes,Ua: NEGATIVE
Nitrite: NEGATIVE
Specific Gravity, Urine: 1.025 (ref 1.000–1.030)
Total Protein, Urine: NEGATIVE
Urine Glucose: NEGATIVE
Urobilinogen, UA: 0.2 (ref 0.0–1.0)
pH: 6 (ref 5.0–8.0)

## 2019-11-01 LAB — HEPATIC FUNCTION PANEL
ALT: 26 U/L (ref 0–53)
AST: 25 U/L (ref 0–37)
Albumin: 4 g/dL (ref 3.5–5.2)
Alkaline Phosphatase: 61 U/L (ref 39–117)
Bilirubin, Direct: 0.1 mg/dL (ref 0.0–0.3)
Total Bilirubin: 0.6 mg/dL (ref 0.2–1.2)
Total Protein: 6.3 g/dL (ref 6.0–8.3)

## 2019-11-01 LAB — BASIC METABOLIC PANEL
BUN: 20 mg/dL (ref 6–23)
CO2: 28 mEq/L (ref 19–32)
Calcium: 9.3 mg/dL (ref 8.4–10.5)
Chloride: 106 mEq/L (ref 96–112)
Creatinine, Ser: 1.16 mg/dL (ref 0.40–1.50)
GFR: 61.85 mL/min (ref >60.00)
Glucose, Bld: 89 mg/dL (ref 70–99)
Potassium: 4.7 mEq/L (ref 3.5–5.1)
Sodium: 140 mEq/L (ref 135–145)

## 2019-11-01 LAB — TSH: TSH: 4.71 u[IU]/mL — ABNORMAL HIGH (ref 0.35–4.50)

## 2019-11-01 LAB — HEMOGLOBIN A1C: Hgb A1c MFr Bld: 5.8 % (ref 4.6–6.5)

## 2019-11-07 ENCOUNTER — Encounter: Payer: 59 | Admitting: Internal Medicine

## 2019-11-10 ENCOUNTER — Telehealth (HOSPITAL_COMMUNITY): Payer: Self-pay | Admitting: *Deleted

## 2019-11-10 NOTE — Telephone Encounter (Signed)
Reaching out to patient to offer assistance regarding upcoming cardiac imaging study; pt verbalizes understanding of appt date/time, parking situation and where to check in, and verified current allergies; name and call back number provided for further questions should they arise ° °Markiesha Delia Tai RN Navigator Cardiac Imaging °[facility] °[phone] office °[phone] cell ° °

## 2019-11-10 NOTE — Telephone Encounter (Signed)
Attempted to call patient regarding upcoming cardiac MRI appointment. Left message on voicemail with name and callback number  Ashante Snelling Tai RN Navigator Cardiac Imaging [facility] [phone] Office [phone] Cell  

## 2019-11-13 ENCOUNTER — Ambulatory Visit (HOSPITAL_COMMUNITY)
Admission: RE | Admit: 2019-11-13 | Discharge: 2019-11-13 | Disposition: A | Discharge status: Home / Self Care | Payer: 59 | Source: Ambulatory Visit | Attending: Internal Medicine | Admitting: Internal Medicine

## 2019-11-13 ENCOUNTER — Other Ambulatory Visit: Payer: Self-pay

## 2019-11-13 DIAGNOSIS — I472 Ventricular tachycardia, unspecified: Secondary | ICD-10-CM

## 2019-11-13 MED ORDER — GADOBUTROL 1 MMOL/ML IV SOLN
10.0000 mL | Freq: Once | INTRAVENOUS | Status: AC | PRN
  Administered 2019-11-13: 10 mL via INTRAVENOUS

## 2019-11-29 ENCOUNTER — Encounter: Payer: Self-pay | Admitting: Internal Medicine

## 2019-11-29 ENCOUNTER — Ambulatory Visit (INDEPENDENT_AMBULATORY_CARE_PROVIDER_SITE_OTHER): Payer: 59 | Admitting: Internal Medicine

## 2019-11-29 ENCOUNTER — Other Ambulatory Visit: Payer: Self-pay

## 2019-11-29 VITALS — BP 110/78 | HR 57 | Temp 97.9°F | Ht 71.0 in | Wt 195.0 lb

## 2019-11-29 DIAGNOSIS — E538 Deficiency of other specified B group vitamins: Secondary | ICD-10-CM | POA: Diagnosis not present

## 2019-11-29 DIAGNOSIS — E559 Vitamin D deficiency, unspecified: Secondary | ICD-10-CM | POA: Diagnosis not present

## 2019-11-29 DIAGNOSIS — R739 Hyperglycemia, unspecified: Secondary | ICD-10-CM

## 2019-11-29 DIAGNOSIS — Z0000 Encounter for general adult medical examination without abnormal findings: Secondary | ICD-10-CM

## 2019-11-29 DIAGNOSIS — Z23 Encounter for immunization: Secondary | ICD-10-CM

## 2019-11-29 NOTE — Patient Instructions (Signed)
You had the flu shot today  Please continue all other medications as before, and refills have been done if requested.  Please have the pharmacy call with any other refills you may need.  Please continue your efforts at being more active, low cholesterol diet, and weight control.  You are otherwise up to date with prevention measures today.  Please keep your appointments with your specialists as you may have planned  Please make an Appointment to return for your 1 year visit, or sooner if needed, with Lab testing by Appointment as well, to be done about 3-5 days before at the FIRST FLOOR Lab (so this is for TWO appointments - please see the scheduling desk as you leave)  

## 2019-11-29 NOTE — Progress Notes (Signed)
Subjective:    Patient ID: Thomas Caldwell, male    DOB: 09/25/1945, 74 y.o.   MRN: [id number]  HPI  Here for wellness and f/u;  Overall doing ok;  Pt denies Chest pain, worsening SOB, DOE, wheezing, orthopnea, PND, worsening LE edema, palpitations, dizziness or syncope.  Pt denies neurological change such as new headache, facial or extremity weakness.  Pt denies polydipsia, polyuria, or low sugar symptoms. Pt states overall good compliance with treatment and medications, good tolerability, and has been trying to follow appropriate diet.  Pt denies worsening depressive symptoms, suicidal ideation or panic. No fever, night sweats, wt loss, loss of appetite, or other constitutional symptoms.  Pt states good ability with ADL's, has low fall risk, home safety reviewed and adequate, no other significant changes in hearing or vision, and only occasionally active with exercise.  No new complaints.  Due for flu shot Past Medical History:  Diagnosis Date  . ALLERGIC RHINITIS 01/11/2008  . ANEURYSM 02/02/2007   brain  . BRADYCARDIA, CHRONIC 10/06/2007  . DIVERTICULOSIS, COLON 07/05/2007  . 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 GERD 07/05/2007  . HYPERLIPIDEMIA 02/02/2007  . HYPERTENSION 02/02/2007  . Lumbago 01/11/2008  . Peptic ulcer   . PERSISTENT DISORDER INITIATING/MAINTAINING SLEEP 02/02/2007   Past Surgical History:  Procedure Laterality Date  . APPENDECTOMY  1982  . LEFT HEART CATH AND CORONARY ANGIOGRAPHY N/A 10/04/2019   Procedure: LEFT HEART CATH AND CORONARY ANGIOGRAPHY;  Surgeon: Lennette Bihari, MD;  Location: [facility] INVASIVE CV LAB;  Service: Cardiovascular;  Laterality: N/A;  . LUMBAR DISC SURGERY  12/2005   Dr. Venetia Maxon  . SAH Left brain  1992   due to brain aneurysm s/p repair    reports that he has never smoked. He has never used smokeless tobacco. He reports current alcohol use. He reports that he does not use drugs. family history includes Esophageal cancer in his brother; Heart disease in  his father and sister; Hypertension in his brother and father. Allergies  Allergen Reactions  . Codeine Other (See Comments)    Confusion   Current Outpatient Medications on File Prior to Visit  Medication Sig Dispense Refill  . amLODipine (NORVASC) 10 MG tablet TAKE 1 TABLET(10 MG) BY MOUTH DAILY 90 tablet 2  . aspirin 81 MG EC tablet Take 81 mg by mouth daily.      . colchicine 0.6 MG tablet Take 1 tablet (0.6 mg total) by mouth daily as needed (knee pain and swelling). 30 tablet 2  . flecainide (TAMBOCOR) 100 MG tablet Take 1 tablet (100 mg total) by mouth 2 (two) times daily. 60 tablet 3  . metoprolol succinate (TOPROL-XL) 50 MG 24 hr tablet Take 1 tablet (50 mg total) by mouth daily. Take with or immediately following a meal. 90 tablet 3  . naproxen (NAPROSYN) 500 MG tablet TAKE 1 TABLET(500 MG) BY MOUTH TWICE DAILY WITH A MEAL 60 tablet 2  . omeprazole (PRILOSEC) 20 MG capsule Take 2 capsules (40 mg total) by mouth daily. (Patient taking differently: Take 20 mg by mouth at bedtime as needed (acid reflux). ) 180 capsule 3  . rosuvastatin (CRESTOR) 20 MG tablet TAKE 1 TABLET(20 MG) BY MOUTH DAILY 90 tablet 2  . sacubitril-valsartan (ENTRESTO) 49-51 MG Take 1 tablet by mouth 2 (two) times daily. 60 tablet 3   Current Facility-Administered Medications on File Prior to Visit  Medication Dose Route Frequency Provider Last Rate Last Admin  . sodium chloride flush (NS) 0.9 % injection  3 mL  3 mL Intravenous Q12H Duke Salvia, MD       Review of Systems All otherwise neg per pt    Objective:   Physical Exam BP 110/78 (BP Location: Left Arm, Patient Position: Sitting, Cuff Size: Large)   Pulse (!) 57   Temp 97.9 F (36.6 C) (Oral)   Ht 5\' 11"  (1.803 m)   Wt 195 lb (88.5 kg)   SpO2 97%   BMI 27.20 kg/m  VS noted,  Constitutional: Pt appears in NAD HENT: Head: NCAT.  Right Ear: External ear normal.  Left Ear: External ear normal.  Eyes: . Pupils are equal, round, and reactive to  light. Conjunctivae and EOM are normal Nose: without d/c or deformity Neck: Neck supple. Gross normal ROM Cardiovascular: Normal rate and regular rhythm.   Pulmonary/Chest: Effort normal and breath sounds without rales or wheezing.  Abd:  Soft, NT, ND, + BS, no organomegaly Neurological: Pt is alert. At baseline orientation, motor grossly intact Skin: Skin is warm. No rashes, other new lesions, no LE edema Psychiatric: Pt behavior is normal without agitation  All otherwise neg per pt Lab Results  Component Value Date   WBC 8.9 11/01/2019   HGB 14.4 11/01/2019   HCT 43.2 11/01/2019   PLT 185.0 11/01/2019   GLUCOSE 89 11/01/2019   CHOL 143 11/01/2019   TRIG 79.0 11/01/2019   HDL 45.40 11/01/2019   LDLCALC 82 11/01/2019   ALT 26 11/01/2019   AST 25 11/01/2019   NA 140 11/01/2019   K 4.7 11/01/2019   CL 106 11/01/2019   CREATININE 1.16 11/01/2019   BUN 20 11/01/2019   CO2 28 11/01/2019   TSH 4.71 (H) 11/01/2019   PSA 1.12 11/01/2019   HGBA1C 5.8 11/01/2019       Assessment & Plan:

## 2019-12-03 ENCOUNTER — Encounter: Payer: Self-pay | Admitting: Internal Medicine

## 2019-12-03 NOTE — Addendum Note (Signed)
Addended by: Corwin Levins on: 12/03/2019 10:08 AM   Modules accepted: Orders

## 2019-12-03 NOTE — Assessment & Plan Note (Signed)
stable overall by history and exam, recent data reviewed with pt, and pt to continue medical treatment as before,  to f/u any worsening symptoms or concerns  

## 2019-12-03 NOTE — Assessment & Plan Note (Signed)

## 2019-12-28 ENCOUNTER — Ambulatory Visit (INDEPENDENT_AMBULATORY_CARE_PROVIDER_SITE_OTHER): Payer: 59 | Admitting: Student

## 2019-12-28 ENCOUNTER — Encounter: Payer: Self-pay | Admitting: Student

## 2019-12-28 ENCOUNTER — Other Ambulatory Visit: Payer: Self-pay

## 2019-12-28 VITALS — BP 120/72 | HR 52 | Ht 72.0 in | Wt 196.0 lb

## 2019-12-28 DIAGNOSIS — I472 Ventricular tachycardia, unspecified: Secondary | ICD-10-CM

## 2019-12-28 DIAGNOSIS — I4729 Other ventricular tachycardia: Secondary | ICD-10-CM

## 2019-12-28 DIAGNOSIS — I5020 Unspecified systolic (congestive) heart failure: Secondary | ICD-10-CM

## 2019-12-28 LAB — BASIC METABOLIC PANEL
BUN/Creatinine Ratio: 16 (ref 10–24)
BUN: 19 mg/dL (ref 8–27)
CO2: 24 mmol/L (ref 20–29)
Calcium: 9.3 mg/dL (ref 8.6–10.2)
Chloride: 104 mmol/L (ref 96–106)
Creatinine, Ser: 1.2 mg/dL (ref 0.76–1.27)
GFR calc Af Amer: 69 mL/min/{1.73_m2} (ref >59)
GFR calc non Af Amer: 60 mL/min/{1.73_m2} (ref >59)
Glucose: 96 mg/dL (ref 65–99)
Potassium: 4.7 mmol/L (ref 3.5–5.2)
Sodium: 140 mmol/L (ref 134–144)

## 2019-12-28 LAB — MAGNESIUM: Magnesium: 1.8 mg/dL (ref 1.6–2.3)

## 2019-12-28 NOTE — Patient Instructions (Addendum)
Medication Instructions:  *If you need a refill on your cardiac medications before your next appointment, please call your pharmacy*  Lab Work: Your physician has recommended that you have lab work today: BMET and Magnesium Level If you have labs (blood work) drawn today and your tests are completely normal, you will receive your results only by: Marland Kitchen MyChart Message (if you have MyChart) OR . A paper copy in the mail If you have any lab test that is abnormal or we need to change your treatment, we will call you to review the results.  Testing/Procedures: Your physician has requested that you have an echocardiogram. Echocardiography is a painless test that uses sound waves to create images of your heart. It provides your doctor with information about the size and shape of your heart and how well your heart's chambers and valves are working. This procedure takes approximately one hour. There are no restrictions for this procedure.  Follow-Up: At [facility], you and your health needs are our priority.  As part of our continuing mission to provide you with exceptional heart care, we have created designated Provider Care Teams.  These Care Teams include your primary Cardiologist (physician) and Advanced Practice Providers (APPs -  Physician Assistants and Nurse Practitioners) who all work together to provide you with the care you need, when you need it.  We recommend signing up for the patient portal called "MyChart".  Sign up information is provided on this After Visit Summary.  MyChart is used to connect with patients for Virtual Visits (Telemedicine).  Patients are able to view lab/test results, encounter notes, upcoming appointments, etc.  Non-urgent messages can be sent to your provider as well.   To learn more about what you can do with MyChart, go to [email or web address].    Your next appointment:   Your physician recommends that you schedule a follow-up appointment in 1-2 weeks with Dr.  Lalla Brothers to discuss Ventricular Tachycardia Ablation and Heart Failure. -- Tuesday 01/16/20 at 10:45 am  The format for your next appointment:   In Person with Steffanie Dunn, MD   Thank you for choosing [facility] !!

## 2019-12-28 NOTE — Progress Notes (Signed)
PCP:  Corwin Levins, MD Primary Cardiologist: No primary care provider on file. Electrophysiologist: Sherryl Manges, MD   Thomas Caldwell is a 74 y.o. male seen today for Sherryl Manges, MD for routine electrophysiology followup.  Since last being seen in our clinic the patient reports doing very well. He is playing golf and walking 12-15k steps daily without any symptoms. He denies tachy palpitations since starting on flecainide. he denies chest pain, palpitations, dyspnea, PND, orthopnea, nausea, vomiting, dizziness, syncope, edema, weight gain, or early satiety.  cMRI 11/14/2019  Past Medical History:  Diagnosis Date  . ALLERGIC RHINITIS 01/11/2008  . ANEURYSM 02/02/2007   brain  . BRADYCARDIA, CHRONIC 10/06/2007  . DIVERTICULOSIS, COLON 07/05/2007  . 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 GERD 07/05/2007  . HYPERLIPIDEMIA 02/02/2007  . HYPERTENSION 02/02/2007  . Lumbago 01/11/2008  . Peptic ulcer   . PERSISTENT DISORDER INITIATING/MAINTAINING SLEEP 02/02/2007   Past Surgical History:  Procedure Laterality Date  . APPENDECTOMY  1982  . LEFT HEART CATH AND CORONARY ANGIOGRAPHY N/A 10/04/2019   Procedure: LEFT HEART CATH AND CORONARY ANGIOGRAPHY;  Surgeon: Lennette Bihari, MD;  Location: [facility] INVAS[facility] CV LAB;  Service: Cardiovascular;  Laterality: N/A;  . LUMBAR DISC SURGERY  12/2005   Dr. Venetia Maxon  . SAH Left brain  1992   due to brain aneurysm s/p repair    Current Outpatient Medications  Medication Sig Dispense Refill  . amLODipine (NORVASC) 10 MG tablet TAKE 1 TABLET(10 MG) BY MOUTH DAILY 90 tablet 2  . aspirin 81 MG EC tablet Take 81 mg by mouth daily.      . colchicine 0.6 MG tablet Take 1 tablet (0.6 mg total) by mouth daily as needed (knee pain and swelling). 30 tablet 2  . flecainide (TAMBOCOR) 100 MG tablet Take 1 tablet (100 mg total) by mouth 2 (two) times daily. 60 tablet 3  . metoprolol succinate (TOPROL-XL) 50 MG 24 hr tablet Take 1 tablet (50 mg total) by mouth daily. Take  with or immediately following a meal. 90 tablet 3  . naproxen (NAPROSYN) 500 MG tablet TAKE 1 TABLET(500 MG) BY MOUTH TWICE DAILY WITH A MEAL 60 tablet 2  . omeprazole (PRILOSEC) 20 MG capsule Take 2 capsules (40 mg total) by mouth daily. 180 capsule 3  . rosuvastatin (CRESTOR) 20 MG tablet TAKE 1 TABLET(20 MG) BY MOUTH DAILY 90 tablet 2  . sacubitril-valsartan (ENTRESTO) 49-51 MG Take 1 tablet by mouth 2 (two) times daily. 60 tablet 3   Current Facility-Administered Medications  Medication Dose Route Frequency Provider Last Rate Last Admin  . sodium chloride flush (NS) 0.9 % injection 3 mL  3 mL Intravenous Q12H Duke Salvia, MD        Allergies  Allergen Reactions  . Codeine Other (See Comments)    Confusion    Social History   Socioeconomic History  . Marital status: Married    Spouse name: Not on file  . Number of children: 2  . Years of education: Not on file  . Highest education level: Not on file  Occupational History  . Occupation: Copywriter, advertising: [employer]  Tobacco Use  . Smoking status: Never Smoker  . Smokeless tobacco: Never Used  Substance and Sexual Activity  . Alcohol use: Yes    Comment: 0-1 per day  . Drug use: No  . Sexual activity: Yes    Birth control/protection: None  Other Topics Concern  . Not on file  Social History Narrative  . Not on file   Social Determinants of Health   Financial Resource Strain:   . Difficulty of Paying Living Expenses: Not on file  Food Insecurity:   . Worried About Programme researcher, broadcasting/film/video in the Last Year: Not on file  . Ran Out of Food in the Last Year: Not on file  Transportation Needs:   . Lack of Transportation (Medical): Not on file  . Lack of Transportation (Non-Medical): Not on file  Physical Activity:   . Days of Exercise per Week: Not on file  . Minutes of Exercise per Session: Not on file  Stress:   . Feeling of Stress : Not on file  Social Connections:   . Frequency of Communication  with Friends and Family: Not on file  . Frequency of Social Gatherings with Friends and Family: Not on file  . Attends Religious Services: Not on file  . Active Member of Clubs or Organizations: Not on file  . Attends Banker Meetings: Not on file  . Marital Status: Not on file  Intimate Partner Violence:   . Fear of Current or Ex-Partner: Not on file  . Emotionally Abused: Not on file  . Physically Abused: Not on file  . Sexually Abused: Not on file     Review of Systems: All other systems reviewed and are otherwise negative except as noted above.  Physical Exam: Vitals:   12/28/19 0946  BP: 120/72  Pulse: (!) 52  SpO2: 98%  Weight: 196 lb (88.9 kg)  Height: 6' (1.829 m)    GEN- The patient is well appearing, alert and oriented x 3 today.   HEENT: normocephalic, atraumatic; sclera clear, conjunctiva pink; hearing intact; oropharynx clear; neck supple, no JVP Lymph- no cervical lymphadenopathy Lungs- Clear to ausculation bilaterally, normal work of breathing.  No wheezes, rales, rhonchi Heart- Somewhat irregular rate and rhythm due to ectopy, no murmurs, rubs or gallops, PMI not laterally displaced GI- soft, non-tender, non-distended, bowel sounds present, no hepatosplenomegaly Extremities- no clubbing, cyanosis, or edema; DP/PT/radial pulses 2+ bilaterally MS- no significant deformity or atrophy Skin- warm and dry, no rash or lesion Psych- euthymic mood, full affect Neuro- strength and sensation are intact  EKG is ordered. Personal review of EKG from today shows sinus bradycardia at 52 bpm with 1st degree AV block at 212 ms, and QRS of 192 ms which is a significant change from prior to flecainide (88 ms 10/04/19).   EKG strips showed frequent ectopy including couplets, triplets, and trigeminy. Reviewed with Dr.s Graciela Husbands and Lalla Brothers  Additional studies reviewed include: Echo, cMRI, previous EP office notes and recent results.   Assessment and Plan:  1. 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 4-9 beats.  He has had marked prolongation of his QRS in a LBBB pattern on flecainide. STOP flecainide.  Update Echo on Entresto/Toprol.  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 Dr. Lalla Brothers has seen EKGs and previously discussed this patient with Dr. Graciela Husbands. Will follow up closely in the next 1-2 weeks to discuss ablation.   2. Chronic systolic CHF,  Echo 10/04/2019 LVEF 30-35% Cath 09/2019 showed moderate non-obstructive disease. ? 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 EF 34% Continue Toprol 50 mg daily Continue Entresto 49/51 mg BID Consider spiro based on labs.   Discussed patient at length with Dr. Graciela Husbands who agreed with plan above. Dr. Lalla Brothers to  see in close follow up to discuss VT ablation. Can adjust plan as needed depending on labs today and updated echo.    Graciella Freer, PA-C  12/28/19 10:00 AM

## 2019-12-29 NOTE — Addendum Note (Signed)
Addended by: Maxine Glenn A on: 12/29/2019 07:53 AM   Modules accepted: Orders

## 2020-01-01 ENCOUNTER — Ambulatory Visit (HOSPITAL_COMMUNITY): Payer: 59 | Attending: Cardiology

## 2020-01-01 ENCOUNTER — Other Ambulatory Visit: Payer: Self-pay

## 2020-01-01 DIAGNOSIS — I5020 Unspecified systolic (congestive) heart failure: Secondary | ICD-10-CM | POA: Diagnosis present

## 2020-01-01 LAB — ECHOCARDIOGRAM COMPLETE
Area-P 1/2: 2.44 cm2
S' Lateral: 5.1 cm

## 2020-01-01 MED ORDER — PERFLUTREN LIPID MICROSPHERE
1.0000 mL | INTRAVENOUS | Status: AC | PRN
  Administered 2020-01-01: 1 mL via INTRAVENOUS

## 2020-01-16 ENCOUNTER — Ambulatory Visit (INDEPENDENT_AMBULATORY_CARE_PROVIDER_SITE_OTHER): Payer: 59 | Admitting: Internal Medicine

## 2020-01-16 ENCOUNTER — Encounter: Payer: Self-pay | Admitting: Internal Medicine

## 2020-01-16 ENCOUNTER — Other Ambulatory Visit: Payer: Self-pay

## 2020-01-16 ENCOUNTER — Encounter: Payer: Self-pay | Admitting: Cardiology

## 2020-01-16 ENCOUNTER — Ambulatory Visit: Payer: 59 | Admitting: Cardiology

## 2020-01-16 VITALS — BP 118/66 | HR 50 | Ht 72.0 in | Wt 200.0 lb

## 2020-01-16 DIAGNOSIS — I5020 Unspecified systolic (congestive) heart failure: Secondary | ICD-10-CM

## 2020-01-16 DIAGNOSIS — I472 Ventricular tachycardia, unspecified: Secondary | ICD-10-CM

## 2020-01-16 DIAGNOSIS — H6091 Unspecified otitis externa, right ear: Secondary | ICD-10-CM | POA: Diagnosis not present

## 2020-01-16 DIAGNOSIS — I4729 Other ventricular tachycardia: Secondary | ICD-10-CM

## 2020-01-16 MED ORDER — AMOXICILLIN-POT CLAVULANATE 875-125 MG PO TABS: 1.0000 | ORAL_TABLET | Freq: Two times a day (BID) | ORAL | 0 refills | Status: AC

## 2020-01-16 MED ORDER — DRONEDARONE HCL 400 MG PO TABS: 400.0000 mg | ORAL_TABLET | Freq: Two times a day (BID) | ORAL | 11 refills | Status: DC

## 2020-01-16 NOTE — Patient Instructions (Signed)
We have sent in the augmentin to take 1 pill twice a day for 1 week.    

## 2020-01-16 NOTE — Assessment & Plan Note (Signed)
Rx augmentin 1 week course.

## 2020-01-16 NOTE — Progress Notes (Signed)
   Subjective:   Patient ID: Thomas Caldwell, male    DOB: 09/27/1945, 74 y.o.   MRN: [id number]  HPI The patient is a 74 YO man coming in for concerns about right ear pain and tinnitus. Denies fevers or chills. Some sinus pain on that side. Has had this before and responded well to antibiotics. Denies hearing loss. Left ear normal. No cough or congestion in the chest. Overall stable, going on several weeks at this time.   Review of Systems  Constitutional: Negative.   HENT: Positive for ear pain.   Eyes: Negative.   Respiratory: Negative for cough, chest tightness and shortness of breath.   Cardiovascular: Negative for chest pain, palpitations and leg swelling.  Gastrointestinal: Negative for abdominal distention, abdominal pain, constipation, diarrhea, nausea and vomiting.  Musculoskeletal: Negative.   Skin: Negative.   Neurological: Negative.   Psychiatric/Behavioral: Negative.     Objective:  Physical Exam Constitutional:      Appearance: He is well-developed and well-nourished.  HENT:     Head: Normocephalic and atraumatic.     Right Ear: Ear canal and external ear normal.     Left Ear: Tympanic membrane normal.     Ears:     Comments: Right TM bulging clear fluid and pain to tugging Eyes:     Extraocular Movements: EOM normal.  Cardiovascular:     Rate and Rhythm: Normal rate and regular rhythm.  Pulmonary:     Effort: Pulmonary effort is normal. No respiratory distress.     Breath sounds: Normal breath sounds. No wheezing or rales.  Abdominal:     General: Bowel sounds are normal. There is no distension.     Palpations: Abdomen is soft.     Tenderness: There is no abdominal tenderness. There is no rebound.  Musculoskeletal:        General: No edema.     Cervical back: Normal range of motion.  Skin:    General: Skin is warm and dry.  Neurological:     Mental Status: He is alert and oriented to person, place, and time.     Coordination: Coordination normal.   Psychiatric:        Mood and Affect: Mood and affect normal.     Vitals:   01/16/20 1411  BP: 120/70  Pulse: (!) 52  Temp: 98 F (36.7 C)  TempSrc: Oral  SpO2: 98%  Weight: 197 lb (89.4 kg)  Height: 6' (1.829 m)    This visit occurred during the SARS-CoV-2 public health emergency.  Safety protocols were in place, including screening questions prior to the visit, additional usage of staff PPE, and extensive cleaning of exam room while observing appropriate contact time as indicated for disinfecting solutions.   Assessment & Plan:

## 2020-01-16 NOTE — Progress Notes (Signed)
Electrophysiology Office Note:    Date:  01/16/2020   ID:  Thomas Caldwell, DOB 1945-10-28, MRN [id number]  PCP:  Corwin Levins, MD  [facility] Cardiologist:  No primary care provider on file.  [facility] Electrophysiologist:  Sherryl Manges, MD   Referring MD: Corwin Levins, MD   Chief Complaint: Frequent PVCs  History of Present Illness:    Thomas Caldwell is a 74 y.o. male who presents for an evaluation of frequent PVCs at the request of Dr. Berton Mount. 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  He was last seen Dr. Graciela Husbands on October 12, 2019 for PVCs. The patient had previously been started on flecainide to help manage his PVCs to see if this could improve his left ventricular function. After being started on flecainide, his PVC burden did decrease but on follow-up appointment with [name], PA-C on December 28, 2019, his EKG demonstrated a wide left bundle branch block which was new. Given the new left bundle branch block, the flecainide was discontinued and he was referred to me to discuss other treatment options for his frequent PVCs.  Today he tells me that he does not feel the palpitations. He does not feel lightheaded or dizzy.  Past Medical History:  Diagnosis Date  . ALLERGIC RHINITIS 01/11/2008  . ANEURYSM 02/02/2007   brain  . BRADYCARDIA, CHRONIC 10/06/2007  . DIVERTICULOSIS, COLON 07/05/2007  . 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 GERD 07/05/2007  . HYPERLIPIDEMIA 02/02/2007  . HYPERTENSION 02/02/2007  . Lumbago 01/11/2008  . Peptic ulcer   . PERSISTENT DISORDER INITIATING/MAINTAINING SLEEP 02/02/2007    Past Surgical History:  Procedure Laterality Date  . APPENDECTOMY  1982  . LEFT HEART CATH AND CORONARY ANGIOGRAPHY N/A 10/04/2019   Procedure: LEFT HEART CATH AND CORONARY ANGIOGRAPHY;  Surgeon: Lennette Bihari, MD;  Location: [facility];  Service:  Cardiovascular;  Laterality: N/A;  . LUMBAR DISC SURGERY  12/2005   Dr. Venetia Maxon  . SAH Left brain  1992   due to brain aneurysm s/p repair    Current Medications: Current Meds  Medication Sig  . amLODipine (NORVASC) 10 MG tablet TAKE 1 TABLET(10 MG) BY MOUTH DAILY  . aspirin 81 MG EC tablet Take 81 mg by mouth daily.  . metoprolol succinate (TOPROL-XL) 50 MG 24 hr tablet Take 1 tablet (50 mg total) by mouth daily. Take with or immediately following a meal.  . naproxen (NAPROSYN) 500 MG tablet TAKE 1 TABLET(500 MG) BY MOUTH TWICE DAILY WITH A MEAL  . omeprazole (PRILOSEC) 20 MG capsule Take 2 capsules (40 mg total) by mouth daily.  . rosuvastatin (CRESTOR) 20 MG tablet TAKE 1 TABLET(20 MG) BY MOUTH DAILY  . sacubitril-valsartan (ENTRESTO) 49-51 MG Take 1 tablet by mouth 2 (two) times daily.  . [DISCONTINUED] colchicine 0.6 MG tablet Take 1 tablet (0.6 mg total) by mouth daily as needed (knee pain and swelling).   Current Facility-Administered Medications for the 01/16/20 encounter (Office Visit) with Lanier Prude, MD  Medication  . sodium chloride flush (NS) 0.9 % injection 3 mL     Allergies:   Codeine   Social History   Socioeconomic History  . Marital status: Married    Spouse name: Not on file  . Number of children: 2  . Years of education: Not on file  . Highest education level: Not on file  Occupational History  . Occupation: Copywriter, advertising: [employer]  Tobacco Use  . Smoking status: Never Smoker  . Smokeless tobacco: Never Used  Substance and Sexual Activity  . Alcohol use: Yes    Comment: 0-1 per day  . Drug use: No  . Sexual activity: Yes    Birth control/protection: None  Other Topics Concern  . Not on file  Social History Narrative  . Not on file   Social Determinants of Health   Financial Resource Strain: Not on file  Food Insecurity: Not on file  Transportation Needs: Not on file  Physical Activity: Not on file  Stress: Not on  file  Social Connections: Not on file     Family History: The patient's family history includes Esophageal cancer in his brother; Heart disease in his father and sister; Hypertension in his brother and father. There is no history of Colon cancer, Rectal cancer, or Stomach cancer.  ROS:   Please see the history of present illness.    All other systems reviewed and are negative.  EKGs/Labs/Other Studies Reviewed:    The following studies were reviewed today: Prior notes, prior EKGs, prior echo  January 01, 2020 echo personally reviewed Left ventricular function moderately decreased, 30 to 35% Dilated left ventricle Normal right ventricular function No significant valvular abnormalities  October 27, 2019 ZIO personally reviewed 3-day monitor showed approximately 5% PVC burden, 1 dominant morphology       EKG:   10/04/2019 ECG  - narrow QRS, sinus with frequent monomorphic PVCs (steep inferior axis, transition in V3; sinus transition in V5/6)  12/28/2019 ECG - sinus with wide left bundle morphology QRS. Frequent monomorphic PVCs with steep inferior axis and precordial transition in V3  January 16, 2020 EKG shows sinus rhythm with frequent PVCs that are monomorphic. PVCs have a left superior axis with a precordial transition that is after V5.  Recent Labs: 11/01/2019: ALT 26; Hemoglobin 14.4; Platelets 185.0; TSH 4.71 12/28/2019: BUN 19; Creatinine, Ser 1.20; Magnesium 1.8; Potassium 4.7; Sodium 140  Recent Lipid Panel    Component Value Date/Time   CHOL 143 11/01/2019 0807   TRIG 79.0 11/01/2019 0807   HDL 45.40 11/01/2019 0807   CHOLHDL 3 11/01/2019 0807   VLDL 15.8 11/01/2019 0807   LDLCALC 82 11/01/2019 0807    Physical Exam:    VS:  BP 118/66   Pulse (!) 50   Ht 6' (1.829 m)   Wt 200 lb (90.7 kg)   SpO2 96%   BMI 27.12 kg/m     Wt Readings from Last 3 Encounters:  01/16/20 197 lb (89.4 kg)  01/16/20 200 lb (90.7 kg)  12/28/19 196 lb (88.9 kg)     GEN:   Well nourished, well developed in no acute distress HEENT: Normal NECK: No JVD; No carotid bruits LYMPHATICS: No lymphadenopathy CARDIAC: Irregular rhythm, no murmurs, rubs, gallops RESPIRATORY:  Clear to auscultation without rales, wheezing or rhonchi  ABDOMEN: Soft, non-tender, non-distended MUSCULOSKELETAL:  No edema; No deformity  SKIN: Warm and dry NEUROLOGIC:  Alert and oriented x 3 PSYCHIATRIC:  Normal affect   ASSESSMENT:    1. Ventricular tachycardia (HCC)   2. Systolic heart failure, unspecified HF chronicity (HCC)   3. NSVT (nonsustained ventricular tachycardia) (HCC)    PLAN:    In order of problems listed above:  1. 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 Is unclear whether or not the PVC is driving a cardiomyopathy or vice versa. His PVC was successfully suppressed using flecainide but unfortunately the  flecainide caused a significantly widened QRS begin this therapy not a safe option. His left ventricular function is moderately decreased so I do not think sotalol is a great option. This leaves dronedarone or amiodarone to help pharmacologically suppress his PVCs. Given his age, would favor avoiding amiodarone if possible. Discussed ablation with the patient as well during today's visit. Given the multiple morphologies of his PVCs observed on EKG including 1 originating from the apical septum within the left ventricle well within the region of noncompacted myocardium, do not think that ablation is a good option for him. We therefore decided to trial dronedarone 400 mg twice daily. The patient will follow up with Dr. Graciela Husbands in approximately 3 months to reassess his PVC burden, to see whether or not his symptoms have improved and to determine whether or not his left ventricular function has changed.  2. Left ventricular noncompaction/nonischemic cardiomyopathy NYHA class II symptoms. Defer discussion of defibrillator to his primary  cardiologist Dr. Graciela Husbands. I do think it is reasonable to trial PVC suppression before making a decision about ICD implant given the possibility that his EF could improve with PVC suppression.   Medication Adjustments/Labs and Tests Ordered: Current medicines are reviewed at length with the patient today.  Concerns regarding medicines are outlined above.  No orders of the defined types were placed in this encounter.  Meds ordered this encounter  Medications  . dronedarone (MULTAQ) 400 MG tablet    Sig: Take 1 tablet (400 mg total) by mouth 2 (two) times daily with a meal.    Dispense:  60 tablet    Refill:  11     Signed, Steffanie Dunn, MD, [facility]  01/16/2020 7:51 PM    Electrophysiology [facility]

## 2020-01-16 NOTE — Patient Instructions (Addendum)
Medication Instructions:  Your physician has recommended you make the following change in your medication:   1.  STOP colchicine  2.  START taking dronedarone (Multaq) 400 mg- Take one tablet by mouth twice a day.   Labwork: None ordered.  Testing/Procedures: None ordered.  Follow-Up: Your physician wants you to follow-up in: 3 months with Dr. Graciela Husbands IN PERSON with EKG.     Any Other Special Instructions Will Be Listed Below (If Applicable).  If you need a refill on your cardiac medications before your next appointment, please call your pharmacy.   Dronedarone tablets What is this medicine? DRONEDARONE (droe NE da rone) is an antiarrhythmic drug. It helps make your heart beat regularly. This medicine may be used for other purposes; ask your health care provider or pharmacist if you have questions. COMMON BRAND NAME(S): Multaq What should I tell my health care provider before I take this medicine? They need to know if you have any of these conditions:  heart failure  history of irregular heartbeat  liver disease  liver or lung problems with the past use of amiodarone  low levels of magnesium in the blood  low levels of potassium in the blood  other heart disease  an unusual or allergic reaction to dronedarone, other medicines, foods, dyes, or preservatives  pregnant or trying to get pregnant  breast-feeding How should I use this medicine? Take this medicine by mouth with a glass of water. Follow the directions on the prescription label. Take one tablet with the morning meal and one tablet with the evening meal. Do not take your medicine more often than directed. Do not stop taking except on the advice of your doctor or health care professional. A special MedGuide will be given to you by the pharmacist with each prescription and refill. Be sure to read this information carefully each time. Talk to your pediatrician regarding the use of this medicine in children.  Special care may be needed. Overdosage: If you think you have taken too much of this medicine contact a poison control center or emergency room at once. NOTE: This medicine is only for you. Do not share this medicine with others. What if I miss a dose? If you miss a dose, take it as soon as you can. If it is almost time for your next dose, take only that dose. Do not take double or extra doses. What may interact with this medicine? Do not take this medicine with any of the following medications:  arsenic trioxide  certain antibiotics like clarithromycin, erythromycin, pentamidine, telithromycin, troleandomycin  certain medicines for depression like tricyclic antidepressants  certain medicines for fungal infections like fluconazole, itraconazole, ketoconazole, posaconazole, voriconazole  certain medicines for irregular heart beat like amiodarone, disopyramide, flecainide, ibutilide, quinidine, propafenone, sotalol  certain medicines for malaria like chloroquine, halofantrine  cisapride  cyclosporine  droperidol  haloperidol  methadone  other medicines that prolong the QT interval (cause an abnormal heart rhythm)  pimozide  nefazodone  phenothiazines like chlorpromazine, mesoridazine, prochlorperazine, thioridazine  ritonavir  ziprasidone This medicine may also interact with the following medications:  certain medicines for blood pressure, heart disease, or irregular heart beat like diltiazem, metoprolol, propranolol, verapamil  certain medicines for cholesterol like atorvastatin, lovastatin, simvastatin  certain medicines for seizures like carbamazepine, phenobarbital, phenytoin  digoxin  dofetilide  grapefruit juice  rifampin  sirolimus  St. John's Wort  tacrolimus This list may not describe all possible interactions. Give your health care provider a list of all the  medicines, herbs, non-prescription drugs, or dietary supplements you use. Also tell them  if you smoke, drink alcohol, or use illegal drugs. Some items may interact with your medicine. What should I watch for while using this medicine? Your condition will be monitored closely when you first begin therapy. Often, this drug is first started in a hospital or other monitored health care setting. Once you are on maintenance therapy, visit your doctor or health care professional for regular checks on your progress. Because your condition and use of this medicine carry some risk, it is a good idea to carry an identification card, necklace or bracelet with details of your condition, medications, and doctor or health care professional. Bonita Quin may get drowsy or dizzy. Do not drive, use machinery, or do anything that needs mental alertness until you know how this medicine affects you. Do not stand or sit up quickly, especially if you are an older patient. This reduces the risk of dizzy or fainting spells. What side effects may I notice from receiving this medicine? Side effects that you should report to your doctor or health care professional as soon as possible:  allergic reactions like skin rash, itching or hives, swelling of the face, lips, or tongue  breathing problems  cough  dark urine  fast, irregular heartbeat  general ill feeling or flu-like symptoms  light-colored stools  loss of appetite, nausea  right upper belly pain  slow heartbeat  stomach pain  swelling of the legs or ankles  unusually weak or tired  weight gain  yellowing of the eyes or skin Side effects that usually do not require medical attention (report to your doctor or health care professional if they continue or are bothersome):  nausea  vomiting  stomach pain This list may not describe all possible side effects. Call your doctor for medical advice about side effects. You may report side effects to FDA at 1-800-FDA-1088. Where should I keep my medicine? Keep out of the reach of children. Store at  room temperature between 15 and 30 degrees C (59 and 86 degrees F). Throw away any unused medicine after the expiration date. NOTE: This sheet is a summary. It may not cover all possible information. If you have questions about this medicine, talk to your doctor, pharmacist, or health care provider.  2020 Elsevier/Gold Standard (2018-01-03 10:43:10)

## 2020-01-22 ENCOUNTER — Encounter: Payer: Self-pay | Admitting: Internal Medicine

## 2020-01-22 MED ORDER — NAPROXEN 500 MG PO TABS
ORAL_TABLET | ORAL | 2 refills | Status: DC
Start: 2020-01-22 — End: 2020-05-02

## 2020-01-22 NOTE — Addendum Note (Signed)
Addended by: Oleta Mouse on: 01/22/2020 02:18 PM   Modules accepted: Orders

## 2020-01-30 ENCOUNTER — Other Ambulatory Visit: Payer: Self-pay | Admitting: Student

## 2020-01-31 ENCOUNTER — Other Ambulatory Visit: Payer: Self-pay

## 2020-01-31 MED ORDER — ENTRESTO 49-51 MG PO TABS: 1.0000 | ORAL_TABLET | Freq: Two times a day (BID) | ORAL | 11 refills | Status: DC

## 2020-01-31 NOTE — Telephone Encounter (Signed)
Pt's medication was sent to pt's pharmacy as requested. Confirmation received.  °

## 2020-02-02 ENCOUNTER — Ambulatory Visit: Payer: 59 | Attending: Internal Medicine

## 2020-02-02 ENCOUNTER — Other Ambulatory Visit (HOSPITAL_BASED_OUTPATIENT_CLINIC_OR_DEPARTMENT_OTHER): Payer: Self-pay | Admitting: Internal Medicine

## 2020-02-02 DIAGNOSIS — Z23 Encounter for immunization: Secondary | ICD-10-CM

## 2020-02-02 NOTE — Progress Notes (Signed)
   Covid-19 Vaccination Clinic  Name:  Costantino Kohlbeck    MRN: [id number] DOB: 12/14/1945  02/02/2020  Mr. Gouge was observed post Covid-19 immunization for 15 minutes without incident. He was provided with Vaccine Information Sheet and instruction to access the V-Safe system.   Mr. Geraci was instructed to call 911 with any severe reactions post vaccine: Marland Kitchen Difficulty breathing  . Swelling of face and throat  . A fast heartbeat  . A bad rash all over body  . Dizziness and weakness   Immunizations Administered    Name Date Dose VIS Date Route   Moderna Covid-19 Booster Vaccine 02/02/2020 10:34 AM 0.25 mL 11/15/2019 Intramuscular   Manufacturer: Gala Murdoch   Lot: [id number]   NDC: 22336-122-44

## 2020-02-05 MED FILL — MODERNA COVID-19 VACCINE 10: 100 | 28 days supply | Qty: 0 | Fill #0

## 2020-02-13 NOTE — Progress Notes (Signed)
° °  I, [name], LAT, ATC, am serving as scribe for Dr. [name].  Story Vanvranken is a 75 y.o. male who presents to [facility] today for B hand pain / arthritis, R>L.  He was last seen by Dr. Denyse Amass on 09/04/19 for R knee pain and had a R knee injection.  Since his last visit, pt reports chronic pain in his B hands, R>L, that has been flared up over the last 2-3 months.  He is having triggering in his R ring finger.  He reports tightness in his B hands but no other symptoms.   Pertinent review of systems: No fevers or chills  Relevant historical information: Hypertension, history of ventricular tachycardia.  Has been vaccinated and boosted against COVID-19.   Exam:  BP 112/66 (BP Location: Left Arm, Patient Position: Sitting, Cuff Size: Normal)    Pulse (!) 57    Ht 6' (1.829 m)    Wt 199 lb 6.4 oz (90.4 kg)    SpO2 96%    BMI 27.04 kg/m  General: Well Developed, well nourished, and in no acute distress.   MSK: Right hand tiny nontender nodule palpated at palmar third MCP. Triggering present at fourth MCP.  Nontender. Grip strength intact.  Pulses cap refill and sensation are intact distally.    Lab and Radiology Results   Procedure: Real-time Ultrasound Guided Injection of right fourth A1 pulley (trigger finger injection) Device: Philips Affiniti 50G Images permanently stored and available for review in PACS Tiny ganglion cyst seen at third MCP. Verbal informed consent obtained.  Discussed risks and benefits of procedure. Warned about infection bleeding damage to structures skin hypopigmentation and fat atrophy among others. Patient expresses understanding and agreement Time-out conducted.   Noted no overlying erythema, induration, or other signs of local infection.   Skin prepped in a sterile fashion.   Local anesthesia: Topical Ethyl chloride.   With sterile technique and under real time ultrasound guidance:  40 mg of Kenalog and 1 mL of  lidocaine injected into tendon sheath. Fluid seen entering the tendon sheath.   Completed without difficulty   Pain immediately resolved suggesting accurate placement of the medication.   Advised to call if fevers/chills, erythema, induration, drainage, or persistent bleeding.   Images permanently stored and available for review in the ultrasound unit.  Impression: Technically successful ultrasound guided injection.   Assessment and Plan: 76 y.o. male with right hand fourth digit trigger finger.  Treated with injection and double Band-Aid splint today.  Recheck back as needed.  Precautions reviewed.   PDMP not reviewed this encounter. Orders Placed This Encounter  Procedures   [place] LIMITED JOINT SPACE STRUCTURES UP RIGHT(NO LINKED CHARGES)    Order Specific Question:   Reason for Exam (SYMPTOM  OR DIAGNOSIS REQUIRED)    Answer:   R ring finger pain    Order Specific Question:   Preferred imaging location?    Answer:   [facility]   No orders of the defined types were placed in this encounter.    Discussed warning signs or symptoms. Please see discharge instructions. Patient expresses understanding.   The above documentation has been reviewed and is accurate and complete [name], M.D.

## 2020-02-14 ENCOUNTER — Ambulatory Visit (INDEPENDENT_AMBULATORY_CARE_PROVIDER_SITE_OTHER): Payer: 59 | Admitting: Family Medicine

## 2020-02-14 ENCOUNTER — Ambulatory Visit: Payer: Self-pay

## 2020-02-14 ENCOUNTER — Encounter: Payer: Self-pay | Admitting: Family Medicine

## 2020-02-14 ENCOUNTER — Other Ambulatory Visit: Payer: Self-pay

## 2020-02-14 VITALS — BP 112/66 | HR 57 | Ht 72.0 in | Wt 199.4 lb

## 2020-02-14 DIAGNOSIS — M65341 Trigger finger, right ring finger: Secondary | ICD-10-CM

## 2020-02-14 DIAGNOSIS — M79644 Pain in right finger(s): Secondary | ICD-10-CM

## 2020-02-14 NOTE — Patient Instructions (Addendum)
You had a R ring finger injection today.  Call or go to the ER if you develop a large red swollen joint with extreme pain or oozing puss.   Have fun on the trip.   Use the double bandaid splint.   Recheck with me as needed.   Let me know if you need anything.

## 2020-02-16 MED ORDER — DRONEDARONE HCL 400 MG PO TABS: 400.0000 mg | ORAL_TABLET | Freq: Two times a day (BID) | ORAL | 0 refills | Status: DC

## 2020-02-16 NOTE — Addendum Note (Signed)
Addended by: Malena Peer D on: 02/16/2020 12:22 PM   Modules accepted: Orders

## 2020-02-22 ENCOUNTER — Other Ambulatory Visit: Payer: 59

## 2020-04-08 DIAGNOSIS — I5022 Chronic systolic (congestive) heart failure: Secondary | ICD-10-CM | POA: Insufficient documentation

## 2020-04-15 ENCOUNTER — Encounter: Payer: Self-pay | Admitting: Internal Medicine

## 2020-04-15 ENCOUNTER — Ambulatory Visit (INDEPENDENT_AMBULATORY_CARE_PROVIDER_SITE_OTHER): Payer: 59

## 2020-04-15 ENCOUNTER — Other Ambulatory Visit: Payer: Self-pay

## 2020-04-15 ENCOUNTER — Encounter: Payer: Self-pay | Admitting: *Deleted

## 2020-04-15 ENCOUNTER — Ambulatory Visit: Payer: 59 | Admitting: Internal Medicine

## 2020-04-15 VITALS — BP 128/66 | HR 48 | Ht 72.0 in | Wt 200.4 lb

## 2020-04-15 DIAGNOSIS — I472 Ventricular tachycardia, unspecified: Secondary | ICD-10-CM

## 2020-04-15 DIAGNOSIS — R002 Palpitations: Secondary | ICD-10-CM | POA: Diagnosis not present

## 2020-04-15 DIAGNOSIS — I5022 Chronic systolic (congestive) heart failure: Secondary | ICD-10-CM

## 2020-04-15 MED ORDER — METOPROLOL SUCCINATE ER 25 MG PO TB24: 25.0000 mg | ORAL_TABLET | Freq: Every day | ORAL | 3 refills | Status: DC

## 2020-04-15 NOTE — Progress Notes (Signed)
Patient Care Team: Corwin Levins, MD as PCP - Isaiah Blakes, MD as PCP - Electrophysiology (Cardiology)   HPI  Thomas Caldwell is a 75 y.o. 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  Associate with left bundle branch block and discontinued Saw Dr. 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  Has no impairment in exercise.  No orthopnea peripheral edema.  Able to walk 18 holes of golf.  Almost shot his age.  DATE TEST EF   9/21 Echo   30-35 %   9/21 LHC  <25 % LADm-60  10/21 cMRI 34% LV dilated Prominent trabeculations  ? LVNC LGE neg  12/21 Echo  30-35%    Date PVCs  9/21 7.9   Date Cr K Hgb  12/21 1.2 4.7 14.4             Records and Results Reviewed   Past Medical History:  Diagnosis Date  . ALLERGIC RHINITIS 01/11/2008  . ANEURYSM 02/02/2007   brain  . BRADYCARDIA, CHRONIC 10/06/2007  . DIVERTICULOSIS, COLON 07/05/2007  . 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 GERD 07/05/2007  . HYPERLIPIDEMIA 02/02/2007  . HYPERTENSION 02/02/2007  . Lumbago 01/11/2008  . Peptic ulcer   . PERSISTENT DISORDER INITIATING/MAINTAINING SLEEP 02/02/2007    Past Surgical History:  Procedure Laterality Date  . APPENDECTOMY  1982  . LEFT HEART CATH AND CORONARY ANGIOGRAPHY N/A 10/04/2019   Procedure: LEFT HEART CATH AND CORONARY ANGIOGRAPHY;  Surgeon: Lennette Bihari, MD;  Location: [facility];  Service: Cardiovascular;  Laterality: N/A;  . LUMBAR DISC SURGERY  12/2005   Dr. Venetia Maxon  . SAH Left brain  1992   due to brain aneurysm s/p repair    Current Meds  Medication Sig  . amLODipine (NORVASC) 10 MG tablet TAKE 1 TABLET(10 MG) BY MOUTH DAILY  . aspirin 81 MG EC tablet Take 81 mg by mouth  daily.  Marland Kitchen dronedarone (MULTAQ) 400 MG tablet Take 1 tablet (400 mg total) by mouth 2 (two) times daily with a meal.  . metoprolol succinate (TOPROL-XL) 50 MG 24 hr tablet Take 1 tablet (50 mg total) by mouth daily. Take with or immediately following a meal.  . naproxen (NAPROSYN) 500 MG tablet TAKE 1 TABLET(500 MG) BY MOUTH TWICE DAILY WITH A MEAL  . omeprazole (PRILOSEC) 20 MG capsule Take 2 capsules (40 mg total) by mouth daily.  . rosuvastatin (CRESTOR) 20 MG tablet TAKE 1 TABLET(20 MG) BY MOUTH DAILY  . sacubitril-valsartan (ENTRESTO) 49-51 MG Take 1 tablet by mouth 2 (two) times daily.   Current Facility-Administered Medications for the 04/15/20 encounter (Office Visit) with Duke Salvia, MD  Medication  . sodium chloride flush (NS) 0.9 % injection 3 mL    Allergies  Allergen Reactions  . Codeine Other (See Comments)    Confusion      Review of Systems negative except from HPI and PMH  Physical Exam BP 128/66   Pulse (!) 48   Ht 6' (1.829 m)   Wt 200 lb 6.4 oz (90.9 kg)   BMI 27.18 kg/m  Well developed and well nourished in no acute distress HENT normal E scleral and icterus clear Neck Supple JVP  flat; carotids brisk and full Clear to ausculation Irregular rate with frequent compensatory pauses  no murmurs gallops or rub Soft with active bowel sounds No clubbing cyanosis  Edema Alert and oriented, grossly normal motor and sensory function Skin Warm and Dry  ECG sinus at 48 Interval 21/08/46  CrCl cannot be calculated (Patient's most recent lab result is older than the maximum 21 days allowed.).   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  I am not sanguine that hits impact on PVC burden is going to be appreciable and we will have to make a decision regarding next steps given his young age and his cardiomyopathy.   However, we will undertake monitoring to determine.  With his relative bradycardia seems to be gradually progressing over time, we will decrease his metoprolol from 50--25 mg daily     Current medicines are reviewed at length with the patient today .  The patient does not  have concerns regarding medicines.

## 2020-04-15 NOTE — Progress Notes (Signed)
Patient ID: Thomas Caldwell, male   DOB: 06-29-1945, 75 y.o.   MRN: [id number] Patient enrolled for [employer] to ship a 3 day zio xt long term holter monitor to his home.

## 2020-04-15 NOTE — Patient Instructions (Signed)
Medication Instructions:  Your physician has recommended you make the following change in your medication:   ** Begin Metoprolol to 25mg  - 1 tablet by mouth daily  *If you need a refill on your cardiac medications before your next appointment, please call your pharmacy*   Lab Work: None ordered.  If you have labs (blood work) drawn today and your tests are completely normal, you will receive your results only by: MyChart Message (if you have MyChart) OR . A paper copy in the mail If you have any lab test that is abnormal or we need to change your treatment, we will call you to review the results.   Testing/Procedures: Marland Kitchen- Long Term Monitor Instructions   Your physician has requested you wear your ZIO patch monitor____3___days.   This is a single patch monitor.  [employer] supplies one patch monitor per enrollment.  Additional stickers are not available.   Please do not apply patch if you will be having a Nuclear Stress Test, Echocardiogram, Cardiac CT, MRI, or Chest Xray during the time frame you would be wearing the monitor. The patch cannot be worn during these tests.  You cannot remove and re-apply the ZIO XT patch monitor.   Your ZIO patch monitor will be sent USPS Priority mail from [facility] directly to your home address. The monitor may also be mailed to a PO BOX if home delivery is not available.   It may take 3-5 days to receive your monitor after you have been enrolled.   Once you have received you monitor, please review enclosed instructions.  Your monitor has already been registered assigning a specific monitor serial # to you.   Applying the monitor   Shave hair from upper left chest.   Hold abrader disc by orange tab.  Rub abrader in 40 strokes over left upper chest as indicated in your monitor instructions.   Clean area with 4 enclosed alcohol pads .  Use all pads to assure are is cleaned thoroughly.  Let dry.   Apply patch as indicated in monitor  instructions.  Patch will be place under collarbone on left side of chest with arrow pointing upward.   Rub patch adhesive wings for 2 minutes.Remove white label marked "1".  Remove white label marked "2".  Rub patch adhesive wings for 2 additional minutes.   While looking in a mirror, press and release button in center of patch.  A small green light will flash 3-4 times .  This will be your only indicator the monitor has been turned on.     Do not shower for the first 24 hours.  You may shower after the first 24 hours.   Press button if you feel a symptom. You will hear a small click.  Record Date, Time and Symptom in the Patient Log Book.   When you are ready to remove patch, follow instructions on last 2 pages of Patient Log Book.  Stick patch monitor onto last page of Patient Log Book.   Place Patient Log Book in [place] box.  Use locking tab on box and tape box closed securely.  The [place] and [place] has [employer] on it.  Please place in mailbox as soon as possible.  Your physician should have your test results approximately 7 days after the monitor has been mailed back to [facility].   Call [facility] Customer Care at [phone] if you have questions regarding your ZIO XT patch monitor.  Call them immediately if  you see an orange light blinking on your monitor.   If your monitor falls off in less than 4 days contact our Monitor department at [phone].  If your monitor becomes loose or falls off after 4 days call [employer] at [phone] for suggestions on securing your monitor.     Follow-Up: At [facility], you and your health needs are our priority.  As part of our continuing mission to provide you with exceptional heart care, we have created designated Provider Care Teams.  These Care Teams include your primary Cardiologist (physician) and Advanced Practice Providers (APPs -  Physician Assistants and Nurse Practitioners) who all work together to provide you with  the care you need, when you need it.  We recommend signing up for the patient portal called "MyChart".  Sign up information is provided on this After Visit Summary.  MyChart is used to connect with patients for Virtual Visits (Telemedicine).  Patients are able to view lab/test results, encounter notes, upcoming appointments, etc.  Non-urgent messages can be sent to your provider as well.   To learn more about what you can do with MyChart, go to [email or web address].    Your next appointment:   6 month(s)  The format for your next appointment:   In Person  Provider:   Sherryl Manges, MD

## 2020-04-22 DIAGNOSIS — I472 Ventricular tachycardia: Secondary | ICD-10-CM

## 2020-04-22 DIAGNOSIS — I5022 Chronic systolic (congestive) heart failure: Secondary | ICD-10-CM | POA: Diagnosis not present

## 2020-04-22 DIAGNOSIS — R002 Palpitations: Secondary | ICD-10-CM | POA: Diagnosis not present

## 2020-05-02 ENCOUNTER — Other Ambulatory Visit: Payer: Self-pay | Admitting: Internal Medicine

## 2020-05-29 NOTE — Progress Notes (Signed)
I, Philbert Riser, LAT, ATC acting as a scribe for [name], MD.  Thomas Caldwell is a 75 y.o. male who presents to [facility] today for f/u of B knee pain.  He was last seen by Dr. Denyse Amass on 02/14/20 for B hand pain and prior to that for R knee pain.  He had a prior R knee injection on 09/04/19.  Since his last visit, pt reports both knees are really painful. Pt reports increased pain when transitioning from seated to standing and driving a car. Pt notes pain flare back up over the past 3-4 months.  Diagnostic testing: R knee XR- 03/23/19   Pertinent review of systems: No fevers or chills  Relevant historical information: Prior x-ray showing chondrocalcinosis cannot take colchicine due to medication interaction.   Exam:  BP 132/84 (BP Location: Right Arm, Patient Position: Sitting, Cuff Size: Normal)   Pulse (!) 50   Ht 6' (1.829 m)   Wt 200 lb (90.7 kg)   SpO2 97%   BMI 27.12 kg/m  General: Well Developed, well nourished, and in no acute distress.   MSK: Right knee mild effusion otherwise normal.  Normal motion with crepitation.  Intact strength.  Stable ligamentous exam.  Left knee mild quad atrophy otherwise normal. Normal motion crepitation.  Intact strength. Stable ligamentous exam.    Lab and Radiology Results  Procedure: Real-time Ultrasound Guided Injection of right knee superior lateral patellar space Device: Philips Affiniti 50G Images permanently stored and available for review in PACS Verbal informed consent obtained.  Discussed risks and benefits of procedure. Warned about infection bleeding damage to structures skin hypopigmentation and fat atrophy among others. Patient expresses understanding and agreement Time-out conducted.   Noted no overlying erythema, induration, or other signs of local infection.   Skin prepped in a sterile fashion.   Local anesthesia: Topical Ethyl chloride.   With sterile technique and under real  time ultrasound guidance:  40 milligrams of Kenalog and 2 mL of Marcaine injected into joint. Fluid seen entering the joint capsule.   Completed without difficulty   Pain immediately resolved suggesting accurate placement of the medication.   Advised to call if fevers/chills, erythema, induration, drainage, or persistent bleeding.   Images permanently stored and available for review in the ultrasound unit.  Impression: Technically successful ultrasound guided injection.   Procedure: Real-time Ultrasound Guided Injection of left knee superior lateral patellar space Device: Philips Affiniti 50G Images permanently stored and available for review in PACS Verbal informed consent obtained.  Discussed risks and benefits of procedure. Warned about infection bleeding damage to structures skin hypopigmentation and fat atrophy among others. Patient expresses understanding and agreement Time-out conducted.   Noted no overlying erythema, induration, or other signs of local infection.   Skin prepped in a sterile fashion.   Local anesthesia: Topical Ethyl chloride.   With sterile technique and under real time ultrasound guidance:  40 mg of Kenalog and 2 mL of Marcaine injected into joint. Fluid seen entering the joint capsule.   Completed without difficulty   Pain immediately resolved suggesting accurate placement of the medication.   Advised to call if fevers/chills, erythema, induration, drainage, or persistent bleeding.   Images permanently stored and available for review in the ultrasound unit.  Impression: Technically successful ultrasound guided injection.     EXAM: RIGHT KNEE - COMPLETE 4+ VIEW  COMPARISON:  01/02/2013  FINDINGS: Progression of bilateral chondrocalcinosis. No significant joint space narrowing or effusion. Negative for  fracture. Fabella and mild vascular calcification.  IMPRESSION: Progression of chondrocalcinosis. No joint space narrowing  or effusion.   Electronically Signed   By: Marlan Palau M.D.   On: 03/23/2019 15:39 I, [name], personally (independently) visualized and performed the interpretation of the images attached in this note.       Assessment and Plan: 75 y.o. 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  There also probably is a component due to pseudogout based on chondrocalcinosis on prior x-ray.  Plan for steroid injection.  If not improving he may be a good candidate for physical therapy to work on quad strengthening.  Also recommend Voltaren gel and Tylenol.   PDMP not reviewed this encounter. Orders Placed This Encounter  Procedures  . [place] LIMITED JOINT SPACE STRUCTURES LOW BILAT(NO LINKED CHARGES)    Standing Status:   Future    Number of Occurrences:   1    Standing Expiration Date:   11/30/2020    Order Specific Question:   Reason for Exam (SYMPTOM  OR DIAGNOSIS REQUIRED)    Answer:   chronic bilateral knee pain    Order Specific Question:   Preferred imaging location?    Answer:   [facility]   No orders of the defined types were placed in this encounter.    Discussed warning signs or symptoms. Please see discharge instructions. Patient expresses understanding.   The above documentation has been reviewed and is accurate and complete [name], M.D.

## 2020-05-30 ENCOUNTER — Other Ambulatory Visit: Payer: Self-pay

## 2020-05-30 ENCOUNTER — Ambulatory Visit: Payer: Self-pay

## 2020-05-30 ENCOUNTER — Ambulatory Visit (INDEPENDENT_AMBULATORY_CARE_PROVIDER_SITE_OTHER): Payer: 59 | Admitting: Family Medicine

## 2020-05-30 VITALS — BP 132/84 | HR 50 | Ht 72.0 in | Wt 200.0 lb

## 2020-05-30 DIAGNOSIS — M25561 Pain in right knee: Secondary | ICD-10-CM

## 2020-05-30 DIAGNOSIS — G8929 Other chronic pain: Secondary | ICD-10-CM | POA: Diagnosis not present

## 2020-05-30 DIAGNOSIS — M25562 Pain in left knee: Secondary | ICD-10-CM | POA: Diagnosis not present

## 2020-05-30 NOTE — Patient Instructions (Signed)
Thank you for coming in today.  Please use Voltaren gel (Generic Diclofenac Gel) up to 4x daily for pain as needed.  This is available over-the-counter as both the name brand Voltaren gel and the generic diclofenac gel.  Tylenol arthritis is ok.   If not improving we can do physical therapy.   Keep me updated.   Call or go to the ER if you develop a large red swollen joint with extreme pain or oozing puss.

## 2020-06-03 ENCOUNTER — Ambulatory Visit (INDEPENDENT_AMBULATORY_CARE_PROVIDER_SITE_OTHER): Payer: 59 | Admitting: Internal Medicine

## 2020-06-03 ENCOUNTER — Other Ambulatory Visit: Payer: Self-pay

## 2020-06-03 ENCOUNTER — Encounter: Payer: Self-pay | Admitting: Internal Medicine

## 2020-06-03 VITALS — BP 110/68 | HR 46 | Temp 98.1°F | Ht 72.0 in | Wt 199.0 lb

## 2020-06-03 DIAGNOSIS — J309 Allergic rhinitis, unspecified: Secondary | ICD-10-CM

## 2020-06-03 DIAGNOSIS — H6981 Other specified disorders of Eustachian tube, right ear: Secondary | ICD-10-CM

## 2020-06-03 DIAGNOSIS — H6691 Otitis media, unspecified, right ear: Secondary | ICD-10-CM | POA: Diagnosis not present

## 2020-06-03 MED ORDER — GUAIFENESIN ER 600 MG PO TB12: 1200.0000 mg | ORAL_TABLET | Freq: Two times a day (BID) | ORAL | 1 refills | Status: DC | PRN

## 2020-06-03 MED ORDER — DOXYCYCLINE HYCLATE 100 MG PO TABS: 100.0000 mg | ORAL_TABLET | Freq: Two times a day (BID) | ORAL | 0 refills | Status: DC

## 2020-06-03 MED ORDER — PREDNISONE 10 MG PO TABS: ORAL_TABLET | ORAL | 0 refills | Status: DC

## 2020-06-03 MED ORDER — METHYLPREDNISOLONE ACETATE 80 MG/ML IJ SUSP
80.0000 mg | Freq: Once | INTRAMUSCULAR | Status: AC
  Administered 2020-06-03: 80 mg via INTRAMUSCULAR

## 2020-06-03 NOTE — Progress Notes (Signed)
Patient ID: Thomas Caldwell, male   DOB: [birth year], 75 y.o.   MRN: [id number]        Chief Complaint: right ear pain       HPI:  Thomas Caldwell is a 75 y.o. male here with c/o right ear pain, pressure, reduced hearing with HA, mild right neck discomfort, and scratchy throat and Does have several wks ongoing nasal allergy symptoms with clearish congestion, itch and sneezing, without fever, swelling or wheezing.  . Pt denies chest pain, increased sob or doe, wheezing, orthopnea, PND, increased LE swelling, palpitations, dizziness or syncope.   Pt denies polydipsia, polyuria, or new focal neuro s/s.   Pt denies wt loss, night sweats, loss of appetite, or other constitutional symptoms        Wt Readings from Last 3 Encounters:  06/03/20 199 lb (90.3 kg)  05/30/20 200 lb (90.7 kg)  04/15/20 200 lb 6.4 oz (90.9 kg)   BP Readings from Last 3 Encounters:  06/03/20 110/68  05/30/20 132/84  04/15/20 128/66         Past Medical History:  Diagnosis Date  . ALLERGIC RHINITIS 01/11/2008  . ANEURYSM 02/02/2007   brain  . BRADYCARDIA, CHRONIC 10/06/2007  . DIVERTICULOSIS, COLON 07/05/2007  . 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 GERD 07/05/2007  . HYPERLIPIDEMIA 02/02/2007  . HYPERTENSION 02/02/2007  . Lumbago 01/11/2008  . Peptic ulcer   . PERSISTENT DISORDER INITIATING/MAINTAINING SLEEP 02/02/2007   Past Surgical History:  Procedure Laterality Date  . APPENDECTOMY  1982  . LEFT HEART CATH AND CORONARY ANGIOGRAPHY N/A 10/04/2019   Procedure: LEFT HEART CATH AND CORONARY ANGIOGRAPHY;  Surgeon: Lennette Bihari, MD;  Location: [facility];  Service: Cardiovascular;  Laterality: N/A;  . LUMBAR DISC SURGERY  12/2005   Dr. Venetia Maxon  . SAH Left brain  1992   due to brain aneurysm s/p repair    reports that he has never smoked. He has never used smokeless tobacco. He reports current alcohol use. He reports that he does not use drugs. family history includes Esophageal cancer in his brother; Heart  disease in his father and sister; Hypertension in his brother and father. Allergies  Allergen Reactions  . Codeine Other (See Comments)    Confusion   Current Outpatient Medications on File Prior to Visit  Medication Sig Dispense Refill  . amLODipine (NORVASC) 10 MG tablet TAKE 1 TABLET(10 MG) BY MOUTH DAILY 90 tablet 2  . aspirin 81 MG EC tablet Take 81 mg by mouth daily.    Marland Kitchen dronedarone (MULTAQ) 400 MG tablet Take 1 tablet (400 mg total) by mouth 2 (two) times daily with a meal. 60 tablet 11  . metoprolol succinate (TOPROL XL) 25 MG 24 hr tablet Take 1 tablet (25 mg total) by mouth daily. 90 tablet 3  . naproxen (NAPROSYN) 500 MG tablet TAKE 1 TABLET(500 MG) BY MOUTH TWICE DAILY WITH A MEAL 60 tablet 2  . omeprazole (PRILOSEC) 20 MG capsule Take 2 capsules (40 mg total) by mouth daily. 180 capsule 3  . rosuvastatin (CRESTOR) 20 MG tablet TAKE 1 TABLET(20 MG) BY MOUTH DAILY 90 tablet 2  . sacubitril-valsartan (ENTRESTO) 49-51 MG Take 1 tablet by mouth 2 (two) times daily. 60 tablet 11   Current Facility-Administered Medications on File Prior to Visit  Medication Dose Route Frequency Provider Last Rate Last Admin  . sodium chloride flush (NS) 0.9 % injection 3 mL  3 mL Intravenous Q12H Duke Salvia, MD  ROS:  All others reviewed and negative.  Objective        PE:  BP 110/68 (BP Location: Right Arm, Patient Position: Sitting, Cuff Size: Large)   Pulse (!) 46   Temp 98.1 F (36.7 C) (Oral)   Ht 6' (1.829 m)   Wt 199 lb (90.3 kg)   SpO2 92%   BMI 26.99 kg/m                 Constitutional: Pt appears in NAD               HENT: Head: NCAT.                Right Ear: External ear normal.  Right TM with mod erythema, bulging               Left Ear: External ear normal.                Eyes: . Pupils are equal, round, and reactive to light. Conjunctivae and EOM are normal;   Max sinus areas non tender.  Pharynx with mild erythema, no exudate               Nose: without  d/c or deformity               Neck: Neck supple. Gross normal ROM               Cardiovascular: Normal rate and regular rhythm.                 Pulmonary/Chest: Effort normal and breath sounds without rales or wheezing.                Abd:  Soft, NT, ND, + BS, no organomegaly               Neurological: Pt is alert. At baseline orientation, motor grossly intact               Skin: Skin is warm. No rashes, no other new lesions, LE edema - none               Psychiatric: Pt behavior is normal without agitation   Micro: none  Cardiac tracings I have personally interpreted today:  none  Pertinent Radiological findings (summarize): none   Lab Results  Component Value Date   WBC 8.9 11/01/2019   HGB 14.4 11/01/2019   HCT 43.2 11/01/2019   PLT 185.0 11/01/2019   GLUCOSE 96 12/28/2019   CHOL 143 11/01/2019   TRIG 79.0 11/01/2019   HDL 45.40 11/01/2019   LDLCALC 82 11/01/2019   ALT 26 11/01/2019   AST 25 11/01/2019   NA 140 12/28/2019   K 4.7 12/28/2019   CL 104 12/28/2019   CREATININE 1.20 12/28/2019   BUN 19 12/28/2019   CO2 24 12/28/2019   TSH 4.71 (H) 11/01/2019   PSA 1.12 11/01/2019   HGBA1C 5.8 11/01/2019   Assessment/Plan:  Thomas Caldwell is a 75 y.o. White or Caucasian [1] male with  has a past medical history of ALLERGIC RHINITIS (01/11/2008), ANEURYSM (02/02/2007), BRADYCARDIA, CHRONIC (10/06/2007), DIVERTICULOSIS, COLON (07/05/2007), EUSTACHIAN TUBE DYSFUNCTION (04/06/2009), GERD (07/05/2007), HYPERLIPIDEMIA (02/02/2007), HYPERTENSION (02/02/2007), Lumbago (01/11/2008), Peptic ulcer, and PERSISTENT DISORDER INITIATING/MAINTAINING SLEEP (02/02/2007).  Right otitis media Mild to mod, for antibx course,  to f/u any worsening symptoms or concerns  Eustachian tube dysfunction, right Also for mucinex otc bid prn,  to f/u any worsening symptoms or concerns  Allergic rhinitis Mod  seasonal flar, for depomedrol im 80, predpac asd,  to f/u any worsening symptoms or concerns  Followup:  Return if symptoms worsen or fail to improve.  Oliver Barre, MD 06/09/2020 12:46 PM [facility]

## 2020-06-03 NOTE — Patient Instructions (Signed)
You had the steroid shot today  Please take all new medication as prescribed - the antibiotic and prednisone  You can also take Mucinex (or it's generic off brand) for congestion, and tylenol as needed for pain.  Please continue all other medications as before, and refills have been done if requested.  Please have the pharmacy call with any other refills you may need.  Please keep your appointments with your specialists as you may have planned

## 2020-06-09 ENCOUNTER — Encounter: Payer: Self-pay | Admitting: Internal Medicine

## 2020-06-09 NOTE — Assessment & Plan Note (Signed)
Mild to mod, for antibx course,  to f/u any worsening symptoms or concerns 

## 2020-06-09 NOTE — Assessment & Plan Note (Signed)
Mod seasonal flar, for depomedrol im 80, predpac asd,  to f/u any worsening symptoms or concerns

## 2020-06-09 NOTE — Assessment & Plan Note (Signed)
Also for mucinex otc bid prn,  to f/u any worsening symptoms or concerns 

## 2020-07-01 ENCOUNTER — Other Ambulatory Visit: Payer: Self-pay | Admitting: Internal Medicine

## 2020-07-01 NOTE — Telephone Encounter (Signed)
Please refill as per office routine med refill policy (all routine meds refilled for 3 mo or monthly per pt preference up to one year from last visit, then month to month grace period for 3 mo, then further med refills will have to be denied)  

## 2020-07-09 NOTE — Progress Notes (Signed)
I, Philbert Riser, LAT, ATC acting as a scribe for [name], MD.  Thomas Caldwell is a 75 y.o. male who presents to [facility] at [facility] today for L knee pain. MOI: On Sunday, 07/07/20, pt was pulling a vine out of a tree and stepped backward into a hole and experienced a "pop" in his L knee. Pt was last seen by Dr. Denyse Amass on 05/30/20 for bilat knee pain and was given bilat knee steroid injections and recommended to use Voltaren gel. Today, pt reports knee hyperextended and is unable to go up/down stairs. Pt locates pain to mostly along the anterior aspect of L knee.  Pain is worse with extension especially under force.  L knee swelling: no Aggravates: steps, knee ext Treatments tried: brace, Tylenol  Dx imaging: 03/23/19 R knee XR  01/16/13 R knee MRI  01/02/13 R knee XR & standing bilat knee XR  Pertinent review of systems: No fevers or chills  Relevant historical information: Hypertension.  Does not use nitrate products   Exam:  BP 136/78 (BP Location: Right Arm, Patient Position: Sitting, Cuff Size: Normal)   Pulse (!) 45   Ht 6' (1.829 m)   Wt 193 lb 3.2 oz (87.6 kg)   SpO2 96%   BMI 26.20 kg/m  General: Well Developed, well nourished, and in no acute distress.   MSK: Left knee normal-appearing Tender palpation distal quad tendon. Normal motion pain with extension. Strength 4/5 to knee extension with pain. Intact strength otherwise. Stable ligamentous exam. Negative McMurray's test.    Lab and Radiology Results  Diagnostic Limited MSK Ultrasound of: Left knee Quad tendon: Calcific change distal tendon insertion.  Some hypoechoic change near the lateral insertion site consistent with nonretracted partial tear.  No full-thickness tear visible. Patellar tendon normal. Medial lateral joint line slightly narrowed. Impression: Partial-thickness quad tear  X-ray images left knee obtained today personally and independently interpreted Mild DJD.   Enthesopathic change proximal patella.  No acute fractures. Await formal radiology review   Assessment and Plan: 75 y.o. male with acute knee pain at superior patella consistent with insertional quadriceps tendon partial tear.  Fortunately patient does have reasonably intact strength.  Plan for nitroglycerin patch protocol and physical therapy.  In clinic today I taught patient straight leg raises and open chain knee extension exercises for home exercise program. Would like to avoid a knee immobilizer as I think that will cause more harm than good at this point however if he is suffering and not able to manage on his own that would be a reasonable combination.  However if he needs to reduce weightbearing on his leg certainly crutches or a cane would be reasonable at this point.  Plan nitroglycerin patch and physical therapy.  Recheck in 6 weeks or sooner if needed.   PDMP not reviewed this encounter. Orders Placed This Encounter  Procedures   [place] LIMITED JOINT SPACE STRUCTURES LOW LEFT(NO LINKED CHARGES)    Standing Status:   Future    Number of Occurrences:   1    Standing Expiration Date:   01/09/2021    Order Specific Question:   Reason for Exam (SYMPTOM  OR DIAGNOSIS REQUIRED)    Answer:   left knee pain    Order Specific Question:   Preferred imaging location?    Answer:   [facility] Knee AP/LAT W/[place] Left    Standing Status:   Future    Number of Occurrences:  1    Standing Expiration Date:   07/10/2021    Order Specific Question:   Reason for Exam (SYMPTOM  OR DIAGNOSIS REQUIRED)    Answer:   eval knee pain    Order Specific Question:   Preferred imaging location?    Answer:   [facility]   Ambulatory referral to Physical Therapy    Referral Priority:   Routine    Referral Type:   Physical Medicine    Referral Reason:   Specialty Services Required    Requested Specialty:   Physical Therapy    Number of Visits Requested:   1   Meds  ordered this encounter  Medications   nitroGLYCERIN (NITRODUR - DOSED IN MG/24 HR) 0.2 mg/hr patch    Sig: Apply 1/4 patch daily to tendon for tendonitis.    Dispense:  30 patch    Refill:  1     Discussed warning signs or symptoms. Please see discharge instructions. Patient expresses understanding.   The above documentation has been reviewed and is accurate and complete [name], M.D.

## 2020-07-10 ENCOUNTER — Ambulatory Visit: Payer: Self-pay

## 2020-07-10 ENCOUNTER — Ambulatory Visit (INDEPENDENT_AMBULATORY_CARE_PROVIDER_SITE_OTHER): Payer: 59

## 2020-07-10 ENCOUNTER — Other Ambulatory Visit: Payer: Self-pay

## 2020-07-10 ENCOUNTER — Ambulatory Visit (INDEPENDENT_AMBULATORY_CARE_PROVIDER_SITE_OTHER): Payer: 59 | Admitting: Family Medicine

## 2020-07-10 VITALS — BP 136/78 | HR 45 | Ht 72.0 in | Wt 193.2 lb

## 2020-07-10 DIAGNOSIS — M76899 Other specified enthesopathies of unspecified lower limb, excluding foot: Secondary | ICD-10-CM | POA: Diagnosis not present

## 2020-07-10 DIAGNOSIS — M25562 Pain in left knee: Secondary | ICD-10-CM | POA: Diagnosis not present

## 2020-07-10 MED ORDER — NITROGLYCERIN 0.2 MG/HR TD PT24: MEDICATED_PATCH | TRANSDERMAL | 1 refills | Status: DC

## 2020-07-10 NOTE — Patient Instructions (Signed)
Thank you for coming in today.   I've referred you to Physical Therapy.  Let us know if you don't hear from them in one week.   Nitroglycerin Protocol  Apply 1/4 nitroglycerin patch to affected area daily. Change position of patch within the affected area every 24 hours. You may experience a headache during the first 1-2 weeks of using the patch, these should subside. If you experience headaches after beginning nitroglycerin patch treatment, you may take your preferred over the counter pain reliever. Another side effect of the nitroglycerin patch is skin irritation or rash related to patch adhesive. Please notify our office if you develop more severe headaches or rash, and stop the patch. Tendon healing with nitroglycerin patch may require 12 to 24 weeks depending on the extent of injury. Men should not use if taking Viagra, Cialis, or Levitra.  Do not use if you have migraines or rosacea.    EXERCISES:  Straight leg raises and unloaded knee extension.   Try to do 30 reps 2x dialy.

## 2020-07-11 NOTE — Progress Notes (Signed)
Insertional quad tendon calcific tendinitis is present on x-ray.  Arthritis is present in the knee.

## 2020-07-12 ENCOUNTER — Encounter: Payer: Self-pay | Admitting: Family Medicine

## 2020-07-19 ENCOUNTER — Other Ambulatory Visit: Payer: Self-pay

## 2020-07-19 ENCOUNTER — Ambulatory Visit: Payer: 59 | Attending: Family Medicine

## 2020-07-19 DIAGNOSIS — M545 Low back pain, unspecified: Secondary | ICD-10-CM | POA: Diagnosis present

## 2020-07-19 DIAGNOSIS — M6281 Muscle weakness (generalized): Secondary | ICD-10-CM | POA: Diagnosis present

## 2020-07-19 DIAGNOSIS — M25562 Pain in left knee: Secondary | ICD-10-CM | POA: Diagnosis present

## 2020-07-19 DIAGNOSIS — R600 Localized edema: Secondary | ICD-10-CM | POA: Insufficient documentation

## 2020-07-19 NOTE — Therapy (Signed)
[facility] [address] [place], [place], [address] Phone: [phone]   Fax:  [phone]  Physical Therapy Evaluation  Patient Details  Name: Thomas Caldwell MRN: [id number] Date of Birth: Jun 03, 1945 Referring Provider (PT): Denyse Amass   Encounter Date: 07/19/2020   PT End of Session - 07/19/20 0900     Visit Number 1    Date for PT Re-Evaluation 09/13/20    Authorization Type [employer]    PT Start Time 0800    PT Stop Time 0832    PT Time Calculation (min) 32 min    Activity Tolerance Patient tolerated treatment well;Patient limited by pain    Behavior During Therapy [facility] for tasks assessed/performed             Past Medical History:  Diagnosis Date   ALLERGIC RHINITIS 01/11/2008   ANEURYSM 02/02/2007   brain   BRADYCARDIA, CHRONIC 10/06/2007   DIVERTICULOSIS, COLON 07/05/2007   EUSTACHIAN TUBE DYSFUNCTION 04/06/2009   GERD 07/05/2007   HYPERLIPIDEMIA 02/02/2007   HYPERTENSION 02/02/2007   Lumbago 01/11/2008   Peptic ulcer    PERSISTENT DISORDER INITIATING/MAINTAINING SLEEP 02/02/2007    Past Surgical History:  Procedure Laterality Date   APPENDECTOMY  1982   LEFT HEART CATH AND CORONARY ANGIOGRAPHY N/A 10/04/2019   Procedure: LEFT HEART CATH AND CORONARY ANGIOGRAPHY;  Surgeon: Lennette Bihari, MD;  Location: [facility] IN[facility]AS[facility] CV [facility];  Service: Cardiovascular;  Laterality: N/A;   LUMBAR DISC SURGERY  12/2005   Dr. Venetia Maxon   [facility] Left brain  1992   due to brain aneurysm s/p repair    There were no vitals filed for this visit.    Subjective Assessment - 07/19/20 0759     Subjective 75 y.o. male with acute knee pain that started 2 weeks ago while doing things at home, stepped back and felt a pop with immediate pain. per MD at superior patella consistent with insertional quadriceps tendon partial tear.  He does have reasonably intact strength.  MD Plan for nitroglycerin patch protocol  which pt reports it feels like it helps a bit, and  physical therapy. MD initiated straight leg raises and open chain knee extension. Also reports back started hurt as well.    Pertinent History B knee arthritis with injections (previous last month), LBP, HTN, chronic bradycardia, CHF   How long can you sit comfortably? Can sit with feeling of pain. Increased paij with getting into standing.    How long can you walk comfortably? unlimited    Patient Stated Goals to prevent further tear, get better.    Currently in Pain? Yes    Pain Score 5     Pain Location Knee    Pain Orientation Left    Pain Descriptors / Indicators Aching;Sore    Pain Type Acute pain                [facility] PT Assessment - 07/19/20 0758       Assessment   Medical Diagnosis M25.562 (ICD-10-CM) - Left knee pain, unspecified chronicity  M76.899 (ICD-10-CM) - Quadriceps tendonitis    Referring Provider (PT) Denyse Amass    Hand Dominance Right    Next MD Visit --   some time in August     Precautions   Precaution Comments 35%      Balance Screen   Has the patient fallen in the past 6 months No    Has the patient had a decrease in activity level because of a  fear of falling?  No    Is the patient reluctant to leave their home because of a fear of falling?  No      Home Environment   Additional Comments House with wife. 3 levels with stairs with 1 sided railing. Has not gone upstairs since injury.      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      Strength   Overall Strength Comments Left LE grossly 4/5 hip and knee flex. 3/5 knee ext + pain. RLE grossly 4+/5      Flexibility   Soft Tissue Assessment /Muscle Length --   very tight B hamstrings     Palpation   Palpation comment some TTP and swelling left knee superior to patella L>R      Ambulation/Gait   Gait Pattern --   slightly antalgic   Stairs --   step to with HR, slow     Standardized Balance Assessment   Standardized Balance Assessment Five Times Sit to Stand    Five times sit to stand comments  13 seconds with hands on thighs from elevated mat table.   with pain                       Objective measurements completed on examination: See above findings.               PT Education - 07/19/20 0913     Education Details PT initial POC and HEP. Access Code: [email or web address]    Person(s) Educated Patient    Methods Explanation;Demonstration;Handout    Comprehension Verbalized understanding;Returned demonstration              PT Short Term Goals - 07/19/20 0900       PT SHORT TERM GOAL #1   Title independent with initial HEP    Time 2    Period Weeks    Status New    Target Date 08/02/20               PT Long Term Goals - 07/19/20 0846       PT LONG TERM GOAL #1   Title knee FOTO improved to 63%    Baseline 35%    Time 8    Period Weeks    Status New    Target Date 09/13/20      PT LONG TERM GOAL #2   Title Independent with advanced HEP    Time 8    Period Weeks    Status New    Target Date 09/13/20      PT LONG TERM GOAL #3   Title Pt will report </= 2/10 knee and low back pain with functional activities and ADLs    Time 8    Period Weeks    Status New    Target Date 09/13/20  PT LONG TERM GOAL #4   Title Pt will be able to negotiate stairs using safe reciprocal pattern with </= 2/10 pain to facilitate ability to negotiate staairs within the home    Baseline currently staying primarily on 1st  level d/t pain and increased time needed to go up stairs one at a time    Time 8    Period Weeks    Status New    Target Date 09/13/20                    Plan - 07/19/20 0901     Clinical Impression Statement Pt is a 75 yo male who presents with acute L knee pain which occurred after misstepping backwards 2 weeks ago resulting in a partial tear of insertional quadriceps tendon at superior patella. Pt also reports that since this injury he has also noted some more low back pain.  Pt currently presents with some proximal core and hip mms weakness, decreased quad strength with pain on the left, and pain with functional mobility. As a result pt with difficulty/pain getting into standing from sitting and negotiating stairs as he needs to within his home. Pt will benefit from skilled PT to work on decreasing pain and safely progressing strength and functional mobility.    Stability/Clinical Decision Making Stable/Uncomplicated    Clinical Decision Making Low    Rehab Potential Good    PT Frequency 2x / week    PT Duration 8 weeks    PT Treatment/Interventions ADLs/Self Care Home Management;Cryotherapy;Electrical Stimulation;Iontophoresis 4mg /ml Dexamethasone;Moist Heat;Gait training;Functional mobility training;Therapeutic activities;Therapeutic exercise;Balance training;Neuromuscular re-education;Manual techniques;Patient/family education;Vasopneumatic Device    PT Next Visit Plan Reassess HEP. Gentle progression of LE strength and flexibility as tolerated. Can include some core strength and trunk mobility for LBP. Manual and modalities as needed.    PT Home Exercise Plan see pt instructions    Consulted and Agree with Plan of Care Patient             Patient will benefit from skilled therapeutic intervention in order to improve the following deficits and impairments:  Abnormal gait, Pain, Decreased balance, Impaired flexibility, Decreased strength, Decreased mobility  Visit  Diagnosis: Acute pain of left knee  Low back pain, unspecified back pain laterality, unspecified chronicity, unspecified whether sciatica present  Muscle weakness (generalized)  Localized edema     Problem List Patient Active Problem List   Diagnosis Date Noted   Right otitis media 06/03/2020   Chronic systolic heart failure (HCC) 04/08/2020   Ventricular tachycardia (HCC)    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   Palpitations 08/11/2019   Generalized abdominal pain 04/04/2019   Diarrhea 04/04/2019   Trigger finger, right ring finger 01/06/2019   Hyperglycemia 10/17/2018   Patellar subluxation, left, initial encounter 06/13/2018   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/2018   Greater trochanteric bursitis of right hip 04/18/2018   Nonallopathic lesion of sacral region 01/27/2017   Nonallopathic lesion of thoracic region 01/27/2017   Nonallopathic lesion of lumbosacral region 01/27/2017   Degenerative disc disease, lumbar 12/29/2016   External otitis of right ear 06/09/2016   Skin lesion 07/11/2015   Right Achilles tendinitis 07/05/2014   Unspecified constipation 10/01/2013   Vertigo 08/01/2013   Lateral epicondylitis  of elbow 07/07/2013   Other dysphagia 06/29/2013   Right elbow pain 06/29/2013   Posterior tibial tendinitis of right leg 03/22/2013   Gastroenteritis presumed infectious 02/13/2013   Right knee pain 11/08/2012   Closed fracture of  left distal radius 10/25/2012   Renal insufficiency 06/24/2012   Abnormal TSH 06/24/2012   Recurrent epistaxis 01/19/2012   Fatigue 09/07/2011   Back pain 06/02/2011   Preventative health care 06/09/2010   Eustachian tube dysfunction, right 04/06/2009   NECK PAIN 03/27/2009   Allergic rhinitis 01/11/2008   Lumbago 01/11/2008   Chronic sinus bradycardia 10/06/2007   GERD 07/05/2007   Diverticulosis of colon 07/05/2007   Hyperlipidemia 02/02/2007   PERSISTENT DISORDER INITIATING/MAINTAINING SLEEP 02/02/2007    Essential hypertension 02/02/2007   ANEURYSM 02/02/2007    Anson Crofts, PT, DPT 07/19/2020, 9:22 AM  [facility] [address] [place], [place], [address] Phone: [phone]   Fax:  [phone]  Name: Ajeet Casasola MRN: [id number] Date of Birth: March 05, 1945

## 2020-07-19 NOTE — Patient Instructions (Signed)
Access Code: [email or web address] URL: [email or web address] Date: 07/19/2020 Prepared by: Claude Manges  Exercises Ice (for knee)- 1 x daily - 7 x weekly - 10 -20 minutes hold Heat (for low back)- 1 x daily - 7 x weekly - 10-20 minutes hold Seated Long Arc Quad - 1 x daily - 7 x weekly - 3 sets - 10 reps Active Straight Leg Raise with Quad Set - 1 x daily - 7 x weekly - 2 sets - 10 reps Long Sitting Quad Set with Towel Roll Under Heel - 1 x daily - 7 x weekly - 1-2 sets - 10 reps - 5-10 seconds hold Sidelying Hip Abduction - 1 x daily - 7 x weekly - 2 sets - 10 reps Supine Lower Trunk Rotation - 1 x daily - 7 x weekly - 2 sets - 10 reps - 10 second hold

## 2020-07-23 ENCOUNTER — Other Ambulatory Visit: Payer: Self-pay

## 2020-07-23 ENCOUNTER — Encounter: Payer: Self-pay | Admitting: Physical Therapy

## 2020-07-23 ENCOUNTER — Ambulatory Visit: Payer: 59 | Admitting: Physical Therapy

## 2020-07-23 DIAGNOSIS — R600 Localized edema: Secondary | ICD-10-CM

## 2020-07-23 DIAGNOSIS — M545 Low back pain, unspecified: Secondary | ICD-10-CM

## 2020-07-23 DIAGNOSIS — M6281 Muscle weakness (generalized): Secondary | ICD-10-CM

## 2020-07-23 DIAGNOSIS — M25562 Pain in left knee: Secondary | ICD-10-CM | POA: Diagnosis not present

## 2020-07-23 NOTE — Therapy (Signed)
[facility] [address] [place], [place], [address] Phone: [phone]   Fax:  [phone]  Physical Therapy Treatment  Patient Details  Name: Thomas Caldwell MRN: [id number] Date of Birth: May 21, 1945 Referring Provider (PT): Cheron Every Date: 07/23/2020   PT End of Session - 07/23/20 0839     Visit Number 2    Date for PT Re-Evaluation 09/13/20    PT Start Time 0755    PT Stop Time 0839    PT Time Calculation (min) 44 min    Activity Tolerance Patient tolerated treatment well    Behavior During Therapy [facility] for tasks assessed/performed             Past Medical History:  Diagnosis Date   ALLERGIC RHINITIS 01/11/2008   ANEURYSM 02/02/2007   brain   BRADYCARDIA, CHRONIC 10/06/2007   DIVERTICULOSIS, COLON 07/05/2007   EUSTACHIAN TUBE DYSFUNCTION 04/06/2009   GERD 07/05/2007   HYPERLIPIDEMIA 02/02/2007   HYPERTENSION 02/02/2007   Lumbago 01/11/2008   Peptic ulcer    PERSISTENT DISORDER INITIATING/MAINTAINING SLEEP 02/02/2007    Past Surgical History:  Procedure Laterality Date   APPENDECTOMY  1982   LEFT HEART CATH AND CORONARY ANGIOGRAPHY N/A 10/04/2019   Procedure: LEFT HEART CATH AND CORONARY ANGIOGRAPHY;  Surgeon: Lennette Bihari, MD;  Location: [facility];  Service: Cardiovascular;  Laterality: N/A;   LUMBAR DISC SURGERY  12/2005   Dr. Venetia Maxon   [facility] Left brain  1992   due to brain aneurysm s/p repair    There were no vitals filed for this visit.   Subjective Assessment - 07/23/20 0755     Subjective Pt reports no change since evaluation.    Currently in Pain? Yes    Pain Score 3     Pain Location Knee    Pain Orientation Left                               [facility] Adult PT Treatment/Exercise - 07/23/20 0001       Exercises   Exercises Lumbar;Knee/Hip      Lumbar Exercises: Aerobic   Recumbent Bike L3.5 x 4 min    Nustep L5 x 4 min      Lumbar Exercises: Standing    Shoulder Extension Strengthening;Power Tower;Both;20 reps    Shoulder Extension Limitations 10      Lumbar Exercises: Supine   Bridge Non-compliant;10 reps;2 seconds    Other Supine Lumbar Exercises LE on Pball bridges, K2C, oblq      Knee/Hip Exercises: Standing   Heel Raises Both;2 sets;15 reps;2 seconds    Lateral Step Up Both;1 set;10 reps;Hand Hold: 0;Step Height: 6"    Forward Step Up Both;1 set;10 reps;Hand Hold: 0;Step Height: 6"    Walking with Sports Cord 40lb 4 way x 5 each      Knee/Hip Exercises: Seated   Long Arc Quad Left;Strengthening;2 sets;10 reps    Long Arc Quad Weight 3 lbs.    Hamstring Curl Left;2 sets;15 reps    Hamstring Limitations green tband    Sit to Sand 2 sets;10 reps;5 reps;without UE support   OHP w/ yellow ball                     PT Short Term Goals - 07/23/20 0843       PT SHORT TERM GOAL #1   Title independent with initial  HEP    Status Achieved               PT Long Term Goals - 07/19/20 0846       PT LONG TERM GOAL #1   Title knee FOTO improved to 63%    Baseline 35%    Time 8    Period Weeks    Status New    Target Date 09/13/20      PT LONG TERM GOAL #2   Title Independent with advanced HEP    Time 8    Period Weeks    Status New    Target Date 09/13/20      PT LONG TERM GOAL #3   Title Pt will report </= 2/10 knee and low back pain with functional activities and ADLs    Time 8    Period Weeks    Status New    Target Date 09/13/20      PT LONG TERM GOAL #4   Title Pt will be able to negotiate stairs using safe reciprocal pattern with </= 2/10 pain to facilitate ability to negotiate staairs within the home    Baseline currently staying primarily on 1st level d/t pain and increased time needed to go up stairs one at a time    Time 8    Period Weeks    Status New    Target Date 09/13/20                   Plan - 07/23/20 0839     Clinical Impression Statement Pt tolerated an initial  progression to TE well. He reports that he could fell his knee when he puts pressure on it. Some instability with resisted side step. Cue for postural sn core engagement needed with standing shoulder extensions. Some initial struggle noted with supine bridges. Cues for foot placement needed with lateral step ups.    Stability/Clinical Decision Making Stable/Uncomplicated    Rehab Potential Good    PT Frequency 2x / week    PT Duration 8 weeks    PT Treatment/Interventions ADLs/Self Care Home Management;Cryotherapy;Electrical Stimulation;Iontophoresis 4mg /ml Dexamethasone;Moist Heat;Gait training;Functional mobility training;Therapeutic activities;Therapeutic exercise;Balance training;Neuromuscular re-education;Manual techniques;Patient/family education;Vasopneumatic Device    PT Next Visit Plan Gentle progression of LE strength and flexibility as tolerated. Can include some core strength and trunk mobility for LBP. Manual and modalities as needed.             Patient will benefit from skilled therapeutic intervention in order to improve the following deficits and impairments:  Abnormal gait, Pain, Decreased balance, Impaired flexibility, Decreased strength, Decreased mobility  Visit Diagnosis: Low back pain, unspecified back pain laterality, unspecified chronicity, unspecified whether sciatica present  Localized edema  Muscle weakness (generalized)  Acute pain of left knee     Problem List Patient Active Problem List   Diagnosis Date Noted   Right otitis media 06/03/2020   Chronic systolic heart failure (HCC) 04/08/2020   Ventricular tachycardia (HCC)    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   Palpitations 08/11/2019   Generalized abdominal pain 04/04/2019   Diarrhea 04/04/2019   Trigger finger, right ring finger 01/06/2019   Hyperglycemia 10/17/2018   Patellar subluxation, left, initial encounter 06/13/2018   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/2018   Greater  trochanteric bursitis of right hip 04/18/2018   Nonallopathic lesion of sacral region 01/27/2017   Nonallopathic lesion of thoracic region 01/27/2017   Nonallopathic lesion of lumbosacral region 01/27/2017   Degenerative disc disease,  lumbar 12/29/2016   External otitis of right ear 06/09/2016   Skin lesion 07/11/2015   Right Achilles tendinitis 07/05/2014   Unspecified constipation 10/01/2013   Vertigo 08/01/2013   Lateral epicondylitis  of elbow 07/07/2013   Other dysphagia 06/29/2013   Right elbow pain 06/29/2013   Posterior tibial tendinitis of right leg 03/22/2013   Gastroenteritis presumed infectious 02/13/2013   Right knee pain 11/08/2012   Closed fracture of left distal radius 10/25/2012   Renal insufficiency 06/24/2012   Abnormal TSH 06/24/2012   Recurrent epistaxis 01/19/2012   Fatigue 09/07/2011   Back pain 06/02/2011   Preventative health care 06/09/2010   Eustachian tube dysfunction, right 04/06/2009   NECK PAIN 03/27/2009   Allergic rhinitis 01/11/2008   Lumbago 01/11/2008   Chronic sinus bradycardia 10/06/2007   GERD 07/05/2007   Diverticulosis of colon 07/05/2007   Hyperlipidemia 02/02/2007   PERSISTENT DISORDER INITIATING/MAINTAINING SLEEP 02/02/2007   Essential hypertension 02/02/2007   ANEURYSM 02/02/2007    Grayce Sessions 07/23/2020, 8:44 AM  [facility] [address] [place], [place], [address] Phone: [phone]   Fax:  [phone]  Name: Delontae Lamm MRN: [id number] Date of Birth: 1945/10/11

## 2020-07-26 ENCOUNTER — Ambulatory Visit: Payer: 59 | Attending: Family Medicine | Admitting: Rehabilitative and Restorative Service Providers"

## 2020-07-26 ENCOUNTER — Other Ambulatory Visit: Payer: Self-pay

## 2020-07-26 ENCOUNTER — Encounter: Payer: Self-pay | Admitting: Rehabilitative and Restorative Service Providers"

## 2020-07-26 DIAGNOSIS — M6281 Muscle weakness (generalized): Secondary | ICD-10-CM | POA: Diagnosis present

## 2020-07-26 DIAGNOSIS — M545 Low back pain, unspecified: Secondary | ICD-10-CM | POA: Insufficient documentation

## 2020-07-26 DIAGNOSIS — M25562 Pain in left knee: Secondary | ICD-10-CM | POA: Insufficient documentation

## 2020-07-26 DIAGNOSIS — R600 Localized edema: Secondary | ICD-10-CM | POA: Diagnosis present

## 2020-07-26 NOTE — Therapy (Signed)
[facility] [place] [facility] [address] [place], [place], [address] Phone: [phone]   Fax:  [phone]  Physical Therapy Treatment  Patient Details  Name: Thomas Caldwell MRN: [id number] Date of Birth: [birth year] Referring Provider (PT): Cheron Every Date: 07/26/2020   PT End of Session - 07/26/20 0844     Visit Number 3    Number of Visits 20    Date for PT Re-Evaluation 09/13/20    Authorization Type [employer]    PT Start Time 0841    PT Stop Time 0922    PT Time Calculation (min) 41 min    Activity Tolerance Patient tolerated treatment well    Behavior During Therapy [facility] for tasks assessed/performed             Past Medical History:  Diagnosis Date   ALLERGIC RHINITIS 01/11/2008   ANEURYSM 02/02/2007   brain   BRADYCARDIA, CHRONIC 10/06/2007   DIVERTICULOSIS, COLON 07/05/2007   EUSTACHIAN TUBE DYSFUNCTION 04/06/2009   GERD 07/05/2007   HYPERLIPIDEMIA 02/02/2007   HYPERTENSION 02/02/2007   Lumbago 01/11/2008   Peptic ulcer    PERSISTENT DISORDER INITIATING/MAINTAINING SLEEP 02/02/2007    Past Surgical History:  Procedure Laterality Date   APPENDECTOMY  1982   LEFT HEART CATH AND CORONARY ANGIOGRAPHY N/A 10/04/2019   Procedure: LEFT HEART CATH AND CORONARY ANGIOGRAPHY;  Surgeon: Lennette Bihari, MD;  [facility];  Service: Cardiovascular;  Laterality: N/A;   LUMBAR DISC SURGERY  12/2005   Dr. Venetia Maxon   [facility] Left brain  1992   due to brain aneurysm s/p repair    There were no vitals filed for this visit.   Subjective Assessment - 07/26/20 0844     Subjective I am doing okay    Currently in Pain? Yes    Pain Score 3     Pain Location Knee    Pain Orientation Left    Pain Descriptors / Indicators Aching;Sore                               [facility] Adult PT Treatment/Exercise - 07/26/20 0001       Lumbar Exercises: Stretches   Gastroc Stretch Right;Left;3 reps;20 seconds      Lumbar  Exercises: Aerobic   Recumbent Bike L3.5 x 4 min    Nustep L5 x 4 min      Lumbar Exercises: Standing   Wall Slides 20 reps    Wall Slides Limitations red pball behind pt 2x10 reps      Lumbar Exercises: Seated   Sit to Stand 10 reps    Sit to Stand Limitations on AirEx holding yellow wt ball      Lumbar Exercises: Supine   Bridge 20 reps;2 seconds    Straight Leg Raise 20 reps;1 second    Straight Leg Raises Limitations 3#    Other Supine Lumbar Exercises LE on Pball bridges, K2C, oblq 2x10 each      Lumbar Exercises: Sidelying   Hip Abduction Left;20 reps;1 second    Hip Abduction Weights (lbs) 3#    Other Sidelying Lumbar Exercises Hip adduction 2x10 LLE 3#      Lumbar Exercises: Prone   Straight Leg Raise 20 reps;1 second   LLE with 3#     Knee/Hip Exercises: Standing   Heel Raises Both;2 sets;15 reps;2 seconds    Lateral Step Up Both;1 set;10 reps;Hand Hold: 0;Step  Height: 6"    Forward Step Up Both;1 set;10 reps;Hand Hold: 0;Step Height: 6"    Walking with Sports Cord 40lb 4 way x 5 each                      PT Short Term Goals - 07/23/20 0843       PT SHORT TERM GOAL #1   Title independent with initial HEP    Status Achieved               PT Long Term Goals - 07/26/20 3267       PT LONG TERM GOAL #1   Title knee FOTO improved to 63%    Status On-going      PT LONG TERM GOAL #2   Title Independent with advanced HEP    Status On-going      PT LONG TERM GOAL #3   Title Pt will report </= 2/10 knee and low back pain with functional activities and ADLs    Status On-going      PT LONG TERM GOAL #4   Title Pt will be able to negotiate stairs using safe reciprocal pattern with </= 2/10 pain to facilitate ability to negotiate staairs within the home    Status On-going                   Plan - 07/26/20 0923     Clinical Impression Statement Patient reported some pain during ther ex session, but denied wanting to use cold pack at  end of session.  Patient required cuing during resisted walking for improved posture and maintaining core stability.  He had difficulty with sit to/from stand on airex mat and with wall slides, but able to complete.  He continues to require skilled PT to progress towards goal elated activities.    PT Treatment/Interventions ADLs/Self Care Home Management;Cryotherapy;Electrical Stimulation;Iontophoresis 4mg /ml Dexamethasone;Moist Heat;Gait training;Functional mobility training;Therapeutic activities;Therapeutic exercise;Balance training;Neuromuscular re-education;Manual techniques;Patient/family education;Vasopneumatic Device    PT Next Visit Plan Gentle progression of LE strength and flexibility as tolerated. Can include some core strength and trunk mobility for LBP. Manual and modalities as needed.             Patient will benefit from skilled therapeutic intervention in order to improve the following deficits and impairments:  Abnormal gait, Pain, Decreased balance, Impaired flexibility, Decreased strength, Decreased mobility  Visit Diagnosis: Low back pain, unspecified back pain laterality, unspecified chronicity, unspecified whether sciatica present  Localized edema  Muscle weakness (generalized)  Acute pain of left knee     Problem List Patient Active Problem List   Diagnosis Date Noted   Right otitis media 06/03/2020   Chronic systolic heart failure (HCC) 04/08/2020   Ventricular tachycardia (HCC)    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   Palpitations 08/11/2019   Generalized abdominal pain 04/04/2019   Diarrhea 04/04/2019   Trigger finger, right ring finger 01/06/2019   Hyperglycemia 10/17/2018   Patellar subluxation, left, initial encounter 06/13/2018   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/2018   Greater trochanteric bursitis of right hip 04/18/2018   Nonallopathic lesion of sacral region 01/27/2017   Nonallopathic lesion of thoracic region 01/27/2017    Nonallopathic lesion of lumbosacral region 01/27/2017   Degenerative disc disease, lumbar 12/29/2016   External otitis of right ear 06/09/2016   Skin lesion 07/11/2015   Right Achilles tendinitis 07/05/2014   Unspecified constipation 10/01/2013   Vertigo 08/01/2013   Lateral epicondylitis  of elbow 07/07/2013  Other dysphagia 06/29/2013   Right elbow pain 06/29/2013   Posterior tibial tendinitis of right leg 03/22/2013   Gastroenteritis presumed infectious 02/13/2013   Right knee pain 11/08/2012   Closed fracture of left distal radius 10/25/2012   Renal insufficiency 06/24/2012   Abnormal TSH 06/24/2012   Recurrent epistaxis 01/19/2012   Fatigue 09/07/2011   Back pain 06/02/2011   Preventative health care 06/09/2010   Eustachian tube dysfunction, right 04/06/2009   NECK PAIN 03/27/2009   Allergic rhinitis 01/11/2008   Lumbago 01/11/2008   Chronic sinus bradycardia 10/06/2007   GERD 07/05/2007   Diverticulosis of colon 07/05/2007   Hyperlipidemia 02/02/2007   PERSISTENT DISORDER INITIATING/MAINTAINING SLEEP 02/02/2007   Essential hypertension 02/02/2007   ANEURYSM 02/02/2007    Reather Laurence, PT, DPT 07/26/2020, 9:28 AM  [facility] [place] [address] [place], [place], [address] Phone: [phone]   Fax:  [phone]  Name: Thomas Caldwell MRN: [id number] Date of Birth: 1945/09/04

## 2020-07-30 ENCOUNTER — Ambulatory Visit: Payer: 59 | Admitting: Rehabilitative and Restorative Service Providers"

## 2020-07-31 ENCOUNTER — Other Ambulatory Visit: Payer: Self-pay | Admitting: Internal Medicine

## 2020-08-01 ENCOUNTER — Other Ambulatory Visit: Payer: Self-pay

## 2020-08-01 ENCOUNTER — Ambulatory Visit: Payer: 59 | Admitting: Rehabilitative and Restorative Service Providers"

## 2020-08-01 ENCOUNTER — Encounter: Payer: Self-pay | Admitting: Rehabilitative and Restorative Service Providers"

## 2020-08-01 DIAGNOSIS — M6281 Muscle weakness (generalized): Secondary | ICD-10-CM

## 2020-08-01 DIAGNOSIS — M25562 Pain in left knee: Secondary | ICD-10-CM

## 2020-08-01 DIAGNOSIS — M545 Low back pain, unspecified: Secondary | ICD-10-CM

## 2020-08-01 DIAGNOSIS — R600 Localized edema: Secondary | ICD-10-CM

## 2020-08-01 NOTE — Therapy (Signed)
[facility] [place] [facility] [address] [place], [place], [address] Phone: [phone]   Fax:  [phone]  Physical Therapy Treatment  Patient Details  Name: Thomas Caldwell MRN: [id number] Date of Birth: 04-16-1945 Referring Provider (PT): Cheron Every Date: 08/01/2020   PT End of Session - 08/01/20 0759     Visit Number 4    Number of Visits 20    Date for PT Re-Evaluation 09/13/20    Authorization Type [employer]    PT Start Time 0755    PT Stop Time 0840    PT Time Calculation (min) 45 min    Activity Tolerance Patient tolerated treatment well    Behavior During Therapy [facility] for tasks assessed/performed             Past Medical History:  Diagnosis Date   ALLERGIC RHINITIS 01/11/2008   ANEURYSM 02/02/2007   brain   BRADYCARDIA, CHRONIC 10/06/2007   DIVERTICULOSIS, COLON 07/05/2007   EUSTACHIAN TUBE DYSFUNCTION 04/06/2009   GERD 07/05/2007   HYPERLIPIDEMIA 02/02/2007   HYPERTENSION 02/02/2007   Lumbago 01/11/2008   Peptic ulcer    PERSISTENT DISORDER INITIATING/MAINTAINING SLEEP 02/02/2007    Past Surgical History:  Procedure Laterality Date   APPENDECTOMY  1982   LEFT HEART CATH AND CORONARY ANGIOGRAPHY N/A 10/04/2019   Procedure: LEFT HEART CATH AND CORONARY ANGIOGRAPHY;  Surgeon: Lennette Bihari, MD;  [facility];  Service: Cardiovascular;  Laterality: N/A;   LUMBAR DISC SURGERY  12/2005   Dr. Venetia Maxon   [facility] Left brain  1992   due to brain aneurysm s/p repair    There were no vitals filed for this visit.   Subjective Assessment - 08/01/20 0758     Subjective I am good.    Patient Stated Goals to prevent further tear, get better.    Currently in Pain? No/denies                               [facility] Adult PT Treatment/Exercise - 08/01/20 0001       Lumbar Exercises: Stretches   Gastroc Stretch Right;Left;3 reps;20 seconds      Lumbar Exercises: Aerobic   Recumbent Bike L3.5 x 4 min     Nustep L5 x 4 min      Lumbar Exercises: Standing   Forward Lunge 20 reps;1 second    Forward Lunge Limitations front leg onto bosu      Lumbar Exercises: Seated   Sit to Stand 20 reps    Sit to Stand Limitations on AirEx with yellow wt ball x10 CP, x10 OHP      Lumbar Exercises: Supine   Bridge with Ball Squeeze Compliant;20 reps    Other Supine Lumbar Exercises LE on Pball bridges, K2C, oblq 2x10 each      Knee/Hip Exercises: Standing   Heel Raises Both;2 sets;15 reps;2 seconds    Lateral Step Up Both;1 set;10 reps;Hand Hold: 0;Step Height: 6"    Forward Step Up Both;1 set;10 reps;Hand Hold: 0;Step Height: 6"    Walking with Sports Cord 40lb 4 way x 5 each      Knee/Hip Exercises: Seated   Long Arc Quad Strengthening;Both;2 sets;10 reps    Long Arc Quad Weight 3 lbs.    Hamstring Curl Strengthening;Left;2 sets;15 reps    Hamstring Limitations green tband  PT Short Term Goals - 07/23/20 0843       PT SHORT TERM GOAL #1   Title independent with initial HEP    Status Achieved               PT Long Term Goals - 08/01/20 0941       PT LONG TERM GOAL #1   Title knee FOTO improved to 63%    Status On-going      PT LONG TERM GOAL #2   Title Independent with advanced HEP    Status On-going      PT LONG TERM GOAL #3   Title Pt will report </= 2/10 knee and low back pain with functional activities and ADLs    Status On-going      PT LONG TERM GOAL #4   Title Pt will be able to negotiate stairs using safe reciprocal pattern with </= 2/10 pain to facilitate ability to negotiate staairs within the home    Status On-going                   Plan - 08/01/20 0845     Clinical Impression Statement Pt tolerated session well.  He only reports pain with knee flexion activities and had most pain with sit to/from stands on AirEx.  He had some difficulty with lunges onto bosu secondary to balance.  He required cuing with resisted  walking to maintain upright posture.  He continues to require skilled PT to progress towards goal related activities.    PT Treatment/Interventions ADLs/Self Care Home Management;Cryotherapy;Electrical Stimulation;Iontophoresis 4mg /ml Dexamethasone;Moist Heat;Gait training;Functional mobility training;Therapeutic activities;Therapeutic exercise;Balance training;Neuromuscular re-education;Manual techniques;Patient/family education;Vasopneumatic Device    PT Next Visit Plan Gentle progression of LE strength and flexibility as tolerated. Can include some core strength and trunk mobility for LBP. Manual and modalities as needed.    Consulted and Agree with Plan of Care Patient             Patient will benefit from skilled therapeutic intervention in order to improve the following deficits and impairments:  Abnormal gait, Pain, Decreased balance, Impaired flexibility, Decreased strength, Decreased mobility  Visit Diagnosis: Acute pain of left knee  Low back pain, unspecified back pain laterality, unspecified chronicity, unspecified whether sciatica present  Muscle weakness (generalized)  Localized edema     Problem List Patient Active Problem List   Diagnosis Date Noted   Right otitis media 06/03/2020   Chronic systolic heart failure (HCC) 04/08/2020   Ventricular tachycardia (HCC)    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   Palpitations 08/11/2019   Generalized abdominal pain 04/04/2019   Diarrhea 04/04/2019   Trigger finger, right ring finger 01/06/2019   Hyperglycemia 10/17/2018   Patellar subluxation, left, initial encounter 06/13/2018   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/2018   Greater trochanteric bursitis of right hip 04/18/2018   Nonallopathic lesion of sacral region 01/27/2017   Nonallopathic lesion of thoracic region 01/27/2017   Nonallopathic lesion of lumbosacral region 01/27/2017   Degenerative disc disease, lumbar 12/29/2016   External otitis of right  ear 06/09/2016   Skin lesion 07/11/2015   Right Achilles tendinitis 07/05/2014   Unspecified constipation 10/01/2013   Vertigo 08/01/2013   Lateral epicondylitis  of elbow 07/07/2013   Other dysphagia 06/29/2013   Right elbow pain 06/29/2013   Posterior tibial tendinitis of right leg 03/22/2013   Gastroenteritis presumed infectious 02/13/2013   Right knee pain 11/08/2012   Closed fracture of left distal radius 10/25/2012   Renal  insufficiency 06/24/2012   Abnormal TSH 06/24/2012   Recurrent epistaxis 01/19/2012   Fatigue 09/07/2011   Back pain 06/02/2011   Preventative health care 06/09/2010   Eustachian tube dysfunction, right 04/06/2009   NECK PAIN 03/27/2009   Allergic rhinitis 01/11/2008   Lumbago 01/11/2008   Chronic sinus bradycardia 10/06/2007   GERD 07/05/2007   Diverticulosis of colon 07/05/2007   Hyperlipidemia 02/02/2007   PERSISTENT DISORDER INITIATING/MAINTAINING SLEEP 02/02/2007   Essential hypertension 02/02/2007   ANEURYSM 02/02/2007    Reather Laurence, PT, DPT 08/01/2020, 9:42 AM  [facility] [place] [facility] [address] [place], [place], [address] Phone: [phone]   Fax:  [phone]  Name: Thomas Caldwell MRN: [id number] Date of Birth: 1945-12-29

## 2020-08-06 ENCOUNTER — Other Ambulatory Visit: Payer: Self-pay

## 2020-08-06 ENCOUNTER — Ambulatory Visit: Payer: 59 | Admitting: Physical Therapy

## 2020-08-06 ENCOUNTER — Encounter: Payer: Self-pay | Admitting: Physical Therapy

## 2020-08-06 DIAGNOSIS — M545 Low back pain, unspecified: Secondary | ICD-10-CM | POA: Diagnosis not present

## 2020-08-06 DIAGNOSIS — M25562 Pain in left knee: Secondary | ICD-10-CM

## 2020-08-06 DIAGNOSIS — R600 Localized edema: Secondary | ICD-10-CM

## 2020-08-06 DIAGNOSIS — M6281 Muscle weakness (generalized): Secondary | ICD-10-CM

## 2020-08-06 NOTE — Therapy (Signed)
[facility] [place] [facility] [address] [place], [place], [address] Phone: [phone]   Fax:  [phone]  Physical Therapy Treatment  Patient Details  Name: Thomas Caldwell MRN: [id number] Date of Birth: 05/29/1945 Referring Provider (PT): Cheron Every Date: 08/06/2020   PT End of Session - 08/06/20 0840     Visit Number 5    Number of Visits 20    Date for PT Re-Evaluation 09/13/20    PT Start Time 0800    PT Stop Time 0841    PT Time Calculation (min) 41 min    Activity Tolerance Patient tolerated treatment well    Behavior During Therapy [facility] for tasks assessed/performed             Past Medical History:  Diagnosis Date   ALLERGIC RHINITIS 01/11/2008   ANEURYSM 02/02/2007   brain   BRADYCARDIA, CHRONIC 10/06/2007   DIVERTICULOSIS, COLON 07/05/2007   EUSTACHIAN TUBE DYSFUNCTION 04/06/2009   GERD 07/05/2007   HYPERLIPIDEMIA 02/02/2007   HYPERTENSION 02/02/2007   Lumbago 01/11/2008   Peptic ulcer    PERSISTENT DISORDER INITIATING/MAINTAINING SLEEP 02/02/2007    Past Surgical History:  Procedure Laterality Date   APPENDECTOMY  1982   LEFT HEART CATH AND CORONARY ANGIOGRAPHY N/A 10/04/2019   Procedure: LEFT HEART CATH AND CORONARY ANGIOGRAPHY;  Surgeon: Lennette Bihari, MD;  [facility];  Service: Cardiovascular;  Laterality: N/A;   LUMBAR DISC SURGERY  12/2005   Dr. Venetia Maxon   [facility] Left brain  1992   due to brain aneurysm s/p repair    There were no vitals filed for this visit.   Subjective Assessment - 08/06/20 0800     Subjective "ok" "knees hurt"    Currently in Pain? Yes    Pain Score 3     Pain Location Knee    Pain Orientation Left;Right                               [facility] Adult PT Treatment/Exercise - 08/06/20 0001       High Level Balance   High Level Balance Comments SLS 3x10'' each. SLS with abll toss      Lumbar Exercises: Aerobic   Recumbent Bike L3.5 x 3 min     Nustep L3 x5 min LE only      Knee/Hip Exercises: Machines for Strengthening   Cybex Knee Extension 5lb 2x10    Cybex Knee Flexion 35lb 2x10    Cybex Leg Press 40lb 2x15      Knee/Hip Exercises: Standing   Heel Raises Both;2 sets;15 reps;2 seconds    Lateral Step Up Both;1 set;10 reps;Hand Hold: 0;Step Height: 6"    Forward Step Up Both;1 set;10 reps;Step Height: 8";Hand Hold: 0    Other Standing Knee Exercises Controlled decents 4in 2x10 each    Other Standing Knee Exercises Hip abd & add 10lb x10 each                      PT Short Term Goals - 07/23/20 0843       PT SHORT TERM GOAL #1   Title independent with initial HEP    Status Achieved               PT Long Term Goals - 08/01/20 0941       PT LONG TERM GOAL #1   Title knee FOTO improved  to 63%    Status On-going      PT LONG TERM GOAL #2   Title Independent with advanced HEP    Status On-going      PT LONG TERM GOAL #3   Title Pt will report </= 2/10 knee and low back pain with functional activities and ADLs    Status On-going      PT LONG TERM GOAL #4   Title Pt will be able to negotiate stairs using safe reciprocal pattern with </= 2/10 pain to facilitate ability to negotiate staairs within the home    Status On-going                   Plan - 08/06/20 0841     Clinical Impression Statement All interventions completed well. Some increase knee discomfort with controlled decents. Added seated leg curls and extensions, increase pressure with extensions. He did well with static SL stance some difficulty with ball toss more so with LLE. Weakness noted with hip abd/add    Stability/Clinical Decision Making Stable/Uncomplicated    Rehab Potential Good    PT Frequency 2x / week    PT Duration 8 weeks    PT Treatment/Interventions ADLs/Self Care Home Management;Cryotherapy;Electrical Stimulation;Iontophoresis 4mg /ml Dexamethasone;Moist Heat;Gait training;Functional mobility  training;Therapeutic activities;Therapeutic exercise;Balance training;Neuromuscular re-education;Manual techniques;Patient/family education;Vasopneumatic Device    PT Next Visit Plan Gentle progression of LE strength and flexibility as tolerated. Can include some core strength and trunk mobility for LBP. Manual and modalities as needed.             Patient will benefit from skilled therapeutic intervention in order to improve the following deficits and impairments:  Abnormal gait, Pain, Decreased balance, Impaired flexibility, Decreased strength, Decreased mobility  Visit Diagnosis: Low back pain, unspecified back pain laterality, unspecified chronicity, unspecified whether sciatica present  Muscle weakness (generalized)  Localized edema  Acute pain of left knee     Problem List Patient Active Problem List   Diagnosis Date Noted   Right otitis media 06/03/2020   Chronic systolic heart failure (HCC) 04/08/2020   Ventricular tachycardia (HCC)    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   Palpitations 08/11/2019   Generalized abdominal pain 04/04/2019   Diarrhea 04/04/2019   Trigger finger, right ring finger 01/06/2019   Hyperglycemia 10/17/2018   Patellar subluxation, left, initial encounter 06/13/2018   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/2018   Greater trochanteric bursitis of right hip 04/18/2018   Nonallopathic lesion of sacral region 01/27/2017   Nonallopathic lesion of thoracic region 01/27/2017   Nonallopathic lesion of lumbosacral region 01/27/2017   Degenerative disc disease, lumbar 12/29/2016   External otitis of right ear 06/09/2016   Skin lesion 07/11/2015   Right Achilles tendinitis 07/05/2014   Unspecified constipation 10/01/2013   Vertigo 08/01/2013   Lateral epicondylitis  of elbow 07/07/2013   Other dysphagia 06/29/2013   Right elbow pain 06/29/2013   Posterior tibial tendinitis of right leg 03/22/2013   Gastroenteritis presumed infectious  02/13/2013   Right knee pain 11/08/2012   Closed fracture of left distal radius 10/25/2012   Renal insufficiency 06/24/2012   Abnormal TSH 06/24/2012   Recurrent epistaxis 01/19/2012   Fatigue 09/07/2011   Back pain 06/02/2011   Preventative health care 06/09/2010   Eustachian tube dysfunction, right 04/06/2009   NECK PAIN 03/27/2009   Allergic rhinitis 01/11/2008   Lumbago 01/11/2008   Chronic sinus bradycardia 10/06/2007   GERD 07/05/2007   Diverticulosis of colon 07/05/2007   Hyperlipidemia 02/02/2007  PERSISTENT DISORDER INITIATING/MAINTAINING SLEEP 02/02/2007   Essential hypertension 02/02/2007   ANEURYSM 02/02/2007    Grayce Sessions 08/06/2020, 8:45 AM  [facility] [place] [facility] [address] [place], [place], [address] Phone: [phone]   Fax:  [phone]  Name: Thomas Caldwell MRN: [id number] Date of Birth: May 04, 1945

## 2020-08-08 ENCOUNTER — Ambulatory Visit: Payer: 59 | Admitting: Physical Therapy

## 2020-08-13 ENCOUNTER — Ambulatory Visit: Payer: 59 | Admitting: Physical Therapy

## 2020-08-15 ENCOUNTER — Ambulatory Visit: Payer: 59 | Admitting: Rehabilitative and Restorative Service Providers"

## 2020-08-20 ENCOUNTER — Ambulatory Visit: Payer: 59 | Admitting: Rehabilitative and Restorative Service Providers"

## 2020-08-20 ENCOUNTER — Other Ambulatory Visit: Payer: Self-pay

## 2020-08-20 ENCOUNTER — Encounter: Payer: Self-pay | Admitting: Rehabilitative and Restorative Service Providers"

## 2020-08-20 DIAGNOSIS — M545 Low back pain, unspecified: Secondary | ICD-10-CM

## 2020-08-20 DIAGNOSIS — M6281 Muscle weakness (generalized): Secondary | ICD-10-CM

## 2020-08-20 DIAGNOSIS — R600 Localized edema: Secondary | ICD-10-CM

## 2020-08-20 DIAGNOSIS — M25562 Pain in left knee: Secondary | ICD-10-CM

## 2020-08-20 NOTE — Therapy (Signed)
[facility] [address] [place], [place], [address] Phone: [phone]   Fax:  [phone]  Physical Therapy Treatment  Patient Details  Name: Thomas Caldwell MRN: [id number] Date of Birth: [birth year] Referring Provider (PT): Cheron Every Date: 08/20/2020   PT End of Session - 08/20/20 0805     Visit Number 6    Number of Visits 20    Date for PT Re-Evaluation 09/13/20    Authorization Type [employer]    PT Start Time 0800    PT Stop Time 0840    PT Time Calculation (min) 40 min    Activity Tolerance Patient tolerated treatment well    Behavior During Therapy [facility] for tasks assessed/performed             Past Medical History:  Diagnosis Date   ALLERGIC RHINITIS 01/11/2008   ANEURYSM 02/02/2007   brain   BRADYCARDIA, CHRONIC 10/06/2007   DIVERTICULOSIS, COLON 07/05/2007   EUSTACHIAN TUBE DYSFUNCTION 04/06/2009   GERD 07/05/2007   HYPERLIPIDEMIA 02/02/2007   HYPERTENSION 02/02/2007   Lumbago 01/11/2008   Peptic ulcer    PERSISTENT DISORDER INITIATING/MAINTAINING SLEEP 02/02/2007    Past Surgical History:  Procedure Laterality Date   APPENDECTOMY  1982   LEFT HEART CATH AND CORONARY ANGIOGRAPHY N/A 10/04/2019   Procedure: LEFT HEART CATH AND CORONARY ANGIOGRAPHY;  Surgeon: Lennette Bihari, MD;  Location[facility];  Service: Cardiovascular;  Laterality: N/A;   LUMBAR DISC SURGERY  12/2005   Dr. Venetia Maxon   [facility] Left brain  1992   due to brain aneurysm s/p repair    There were no vitals filed for this visit.   Subjective Assessment - 08/20/20 0803     Subjective I had a work trip at [place] the past few days.  Thomas knee really hurt after sitting on the airplane for so long.    Patient Stated Goals to prevent further tear, get better.    Currently in Pain? Yes    Pain Score 6     Pain Location Knee    Pain Orientation Left;Right    Pain Descriptors / Indicators Aching;Clance Boll Adult PT Treatment/Exercise - 08/20/20 0001       High Level Balance   High Level Balance Comments SLS 3x10'' each. SLS with abll toss      Lumbar Exercises: Stretches   Gastroc Stretch Right;Left;3 reps;20 seconds      Lumbar Exercises: Aerobic   Recumbent Bike L3.5 x 3 min    Nustep L4 x5 min      Lumbar Exercises: Seated   Sit to Stand 10 reps    Sit to Stand Limitations on AirEx      Knee/Hip Exercises: Machines for Strengthening   Cybex Knee Extension 10# 2x10    Cybex Knee Flexion 35lb 2x10    Cybex Leg Press 40lb 2x15      Knee/Hip Exercises: Standing   Heel Raises Both;2 sets;15 reps;2 seconds    Lateral Step Up Both;1 set;10 reps;Hand Hold: 0;Step Height: 6"    Forward Step Up Both;1 set;10 reps;Step Height: 8";Hand Hold: 0    Step Down Both;1 set;10 reps    Step Down Limitations controlled heel tap down and back    Other Standing Knee Exercises Hip abd & add  10lb x10 each      Modalities   Modalities Iontophoresis      Iontophoresis   Type of Iontophoresis Dexamethasone    Location L knee    Dose 4mg /mL    Time 4 hour patch                      PT Short Term Goals - 07/23/20 0843       PT SHORT TERM GOAL #1   Title independent with initial HEP    Status Achieved               PT Long Term Goals - 08/01/20 0941       PT LONG TERM GOAL #1   Title knee FOTO improved to 63%    Status On-going      PT LONG TERM GOAL #2   Title Independent with advanced HEP    Status On-going      PT LONG TERM GOAL #3   Title Pt will report </= 2/10 knee and low back pain with functional activities and ADLs    Status On-going      PT LONG TERM GOAL #4   Title Pt will be able to negotiate stairs using safe reciprocal pattern with </= 2/10 pain to facilitate ability to negotiate staairs within the home    Status On-going                   Plan - 08/20/20 0845     Clinical Impression  Statement Patient with increased bilat knee pain secondary to prolonged pain and increased ambulation on work trip to 08/22/20.  He had some increased knee pain noted with some exercises, but decreased to baseline with return to sitting.  He was educated on the risks/benefits of iontopatch with Dextamethasone, and he wanted to try the treatment in hopes to alleviate his increased pain.  Pt verbalized understanding to remove patch in 4 hours.  He continues to require skilled PT to progress towards goal related activities.    PT Treatment/Interventions ADLs/Self Care Home Management;Cryotherapy;Electrical Stimulation;Iontophoresis 4mg /ml Dexamethasone;Moist Heat;Gait training;Functional mobility training;Therapeutic activities;Therapeutic exercise;Balance training;Neuromuscular re-education;Manual techniques;Patient/family education;Vasopneumatic Device    PT Next Visit Plan Gentle progression of LE strength and flexibility as tolerated. Can include some core strength and trunk mobility for LBP. Manual and modalities as needed.    Consulted and Agree with Plan of Care Patient             Patient will benefit from skilled therapeutic intervention in order to improve the following deficits and impairments:  Abnormal gait, Pain, Decreased balance, Impaired flexibility, Decreased strength, Decreased mobility  Visit Diagnosis: Acute pain of left knee  Muscle weakness (generalized)  Low back pain, unspecified back pain laterality, unspecified chronicity, unspecified whether sciatica present  Localized edema     Problem List Patient Active Problem List   Diagnosis Date Noted   Right otitis media 06/03/2020   Chronic systolic heart failure (HCC) 04/08/2020   Ventricular tachycardia (HCC)    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   Palpitations 08/11/2019   Generalized abdominal pain 04/04/2019   Diarrhea 04/04/2019   Trigger finger, right ring finger 01/06/2019   Hyperglycemia 10/17/2018    Patellar subluxation, left, initial encounter 06/13/2018   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/2018   Greater trochanteric bursitis of right hip 04/18/2018   Nonallopathic lesion of sacral region 01/27/2017   Nonallopathic lesion of thoracic region 01/27/2017   Nonallopathic lesion of  lumbosacral region 01/27/2017   Degenerative disc disease, lumbar 12/29/2016   External otitis of right ear 06/09/2016   Skin lesion 07/11/2015   Right Achilles tendinitis 07/05/2014   Unspecified constipation 10/01/2013   Vertigo 08/01/2013   Lateral epicondylitis  of elbow 07/07/2013   Other dysphagia 06/29/2013   Right elbow pain 06/29/2013   Posterior tibial tendinitis of right leg 03/22/2013   Gastroenteritis presumed infectious 02/13/2013   Right knee pain 11/08/2012   Closed fracture of left distal radius 10/25/2012   Renal insufficiency 06/24/2012   Abnormal TSH 06/24/2012   Recurrent epistaxis 01/19/2012   Fatigue 09/07/2011   Back pain 06/02/2011   Preventative health care 06/09/2010   Eustachian tube dysfunction, right 04/06/2009   NECK PAIN 03/27/2009   Allergic rhinitis 01/11/2008   Lumbago 01/11/2008   Chronic sinus bradycardia 10/06/2007   GERD 07/05/2007   Diverticulosis of colon 07/05/2007   Hyperlipidemia 02/02/2007   PERSISTENT DISORDER INITIATING/MAINTAINING SLEEP 02/02/2007   Essential hypertension 02/02/2007   ANEURYSM 02/02/2007    Reather Laurence, PT, DPT 08/20/2020, 8:49 AM  [facility] [address]. [place], [place], [address] Phone: [phone]   Fax:  [phone]  Name: Thomas Caldwell MRN: [id number] Date of Birth: 10-Mar-1945

## 2020-08-21 ENCOUNTER — Telehealth: Payer: Self-pay

## 2020-08-21 NOTE — Telephone Encounter (Signed)
**Note De-Identified Rielyn Krupinski Obfuscation** I started a Multaq PA through covermymeds. Key: [facility]

## 2020-08-22 ENCOUNTER — Ambulatory Visit: Payer: 59 | Admitting: Rehabilitative and Restorative Service Providers"

## 2020-08-22 ENCOUNTER — Other Ambulatory Visit: Payer: Self-pay

## 2020-08-22 ENCOUNTER — Encounter: Payer: Self-pay | Admitting: Rehabilitative and Restorative Service Providers"

## 2020-08-22 DIAGNOSIS — R600 Localized edema: Secondary | ICD-10-CM

## 2020-08-22 DIAGNOSIS — M545 Low back pain, unspecified: Secondary | ICD-10-CM

## 2020-08-22 DIAGNOSIS — M25562 Pain in left knee: Secondary | ICD-10-CM

## 2020-08-22 DIAGNOSIS — M6281 Muscle weakness (generalized): Secondary | ICD-10-CM

## 2020-08-22 NOTE — Therapy (Signed)
[facility] [address] [place], [place], [address] Phone: [phone]   Fax:  [phone]  Physical Therapy Treatment  Patient Details  Name: Thomas Caldwell MRN: [id number] Date of Birth: 1945/04/16 Referring Provider (PT): Cheron Every Date: 08/22/2020   PT End of Session - 08/22/20 0804     Visit Number 7    Number of Visits 20    Date for PT Re-Evaluation 09/13/20    Authorization Type [employer]    PT Start Time 0800    PT Stop Time 0840    PT Time Calculation (min) 40 min    Activity Tolerance Patient tolerated treatment well    Behavior During Therapy [facility] for tasks assessed/performed             Past Medical History:  Diagnosis Date   ALLERGIC RHINITIS 01/11/2008   ANEURYSM 02/02/2007   brain   BRADYCARDIA, CHRONIC 10/06/2007   DIVERTICULOSIS, COLON 07/05/2007   EUSTACHIAN TUBE DYSFUNCTION 04/06/2009   GERD 07/05/2007   HYPERLIPIDEMIA 02/02/2007   HYPERTENSION 02/02/2007   Lumbago 01/11/2008   Peptic ulcer    PERSISTENT DISORDER INITIATING/MAINTAINING SLEEP 02/02/2007    Past Surgical History:  Procedure Laterality Date   APPENDECTOMY  1982   LEFT HEART CATH AND CORONARY ANGIOGRAPHY N/A 10/04/2019   Procedure: LEFT HEART CATH AND CORONARY ANGIOGRAPHY;  Surgeon: Lennette Bihari, MD;  [facility];  Service: Cardiovascular;  Laterality: N/A;   LUMBAR DISC SURGERY  12/2005   Dr. Venetia Maxon   [facility] Left brain  1992   due to brain aneurysm s/p repair    There were no vitals filed for this visit.   Subjective Assessment - 08/22/20 0803     Subjective That patch really helped a lot.  I didn't feel any pain for about 5-6 hours in that knee.    Patient Stated Goals to prevent further tear, get better.    Currently in Pain? Yes    Pain Score 5     Pain Location Knee    Pain Orientation Right;Left    Pain Descriptors / Indicators Dull                               [facility] Adult PT  Treatment/Exercise - 08/22/20 0001       High Level Balance   High Level Balance Comments SLS 3x10'' each. SLS with ball toss with red wt ball.  Standing on flat surface of bosu ball x2 min.      Lumbar Exercises: Stretches   Gastroc Stretch Right;Left;3 reps;20 seconds      Lumbar Exercises: Aerobic   Recumbent Bike L3.5 x 3 min    Nustep L4 x5 min   LE only     Lumbar Exercises: Seated   Sit to Stand 10 reps    Sit to Stand Limitations on AirEx      Knee/Hip Exercises: Machines for Strengthening   Cybex Knee Extension 15# 2x10    Cybex Knee Flexion 35lb 2x10    Cybex Leg Press 40lb 2x15    Other Machine 4 way hip 10# x10 each      Knee/Hip Exercises: Standing   Heel Raises Both;2 sets;15 reps;2 seconds    Lateral Step Up Both;1 set;10 reps;Hand Hold: 0;Step Height: 6"    Forward Step Up Both;1 set;10 reps;Step Height: 8";Hand Hold: 0    Step Down Both;1 set;10 reps;Hand  Hold: 0;Step Height: 4"    Step Down Limitations controlled heel tap down and back                      PT Short Term Goals - 07/23/20 0843       PT SHORT TERM GOAL #1   Title independent with initial HEP    Status Achieved               PT Long Term Goals - 08/22/20 0905       PT LONG TERM GOAL #1   Title knee FOTO improved to 63%    Status On-going      PT LONG TERM GOAL #2   Title Independent with advanced HEP    Status On-going      PT LONG TERM GOAL #3   Title Pt will report </= 2/10 knee and low back pain with functional activities and ADLs    Status On-going      PT LONG TERM GOAL #4   Title Pt will be able to negotiate stairs using safe reciprocal pattern with </= 2/10 pain to facilitate ability to negotiate staairs within the home    Status On-going                   Plan - 08/22/20 0858     Clinical Impression Statement Thomas Caldwell continues to progress towards goal related activities.  He had overall less pain today than last session and was able to  complete increased weight.  He had some shakiness noted with balancing on flat surface of Bosu ball with CGA required.  Pt tolerated step ups easier, but continues to have more difficulty with controlled heel tap down.  Additionally, Thomas Caldwell requires UE assist with sit to/from stand from AirEx secondary to weakness.  He continues to require skilled PT to progress towards goal related activities.    PT Treatment/Interventions ADLs/Self Care Home Management;Cryotherapy;Electrical Stimulation;Iontophoresis 4mg /ml Dexamethasone;Moist Heat;Gait training;Functional mobility training;Therapeutic activities;Therapeutic exercise;Balance training;Neuromuscular re-education;Manual techniques;Patient/family education;Vasopneumatic Device    PT Next Visit Plan Gentle progression of LE strength and flexibility as tolerated. Can include some core strength and trunk mobility for LBP. Manual and modalities as needed.    Consulted and Agree with Plan of Care Patient             Patient will benefit from skilled therapeutic intervention in order to improve the following deficits and impairments:  Abnormal gait, Pain, Decreased balance, Impaired flexibility, Decreased strength, Decreased mobility  Visit Diagnosis: Acute pain of left knee  Muscle weakness (generalized)  Low back pain, unspecified back pain laterality, unspecified chronicity, unspecified whether sciatica present  Localized edema     Problem List Patient Active Problem List   Diagnosis Date Noted   Right otitis media 06/03/2020   Chronic systolic heart failure (HCC) 04/08/2020   Ventricular tachycardia (HCC)    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   Palpitations 08/11/2019   Generalized abdominal pain 04/04/2019   Diarrhea 04/04/2019   Trigger finger, right ring finger 01/06/2019   Hyperglycemia 10/17/2018   Patellar subluxation, left, initial encounter 06/13/2018   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/2018   Greater  trochanteric bursitis of right hip 04/18/2018   Nonallopathic lesion of sacral region 01/27/2017   Nonallopathic lesion of thoracic region 01/27/2017   Nonallopathic lesion of lumbosacral region 01/27/2017   Degenerative disc disease, lumbar 12/29/2016   External otitis of right ear 06/09/2016   Skin lesion 07/11/2015  Right Achilles tendinitis 07/05/2014   Unspecified constipation 10/01/2013   Vertigo 08/01/2013   Lateral epicondylitis  of elbow 07/07/2013   Other dysphagia 06/29/2013   Right elbow pain 06/29/2013   Posterior tibial tendinitis of right leg 03/22/2013   Gastroenteritis presumed infectious 02/13/2013   Right knee pain 11/08/2012   Closed fracture of left distal radius 10/25/2012   Renal insufficiency 06/24/2012   Abnormal TSH 06/24/2012   Recurrent epistaxis 01/19/2012   Fatigue 09/07/2011   Back pain 06/02/2011   Preventative health care 06/09/2010   Eustachian tube dysfunction, right 04/06/2009   NECK PAIN 03/27/2009   Allergic rhinitis 01/11/2008   Lumbago 01/11/2008   Chronic sinus bradycardia 10/06/2007   GERD 07/05/2007   Diverticulosis of colon 07/05/2007   Hyperlipidemia 02/02/2007   PERSISTENT DISORDER INITIATING/MAINTAINING SLEEP 02/02/2007   Essential hypertension 02/02/2007   ANEURYSM 02/02/2007    Reather Laurence, PT, DPT 08/22/2020, 9:07 AM  [facility] [place] [facility] [address] [place], [place], [address] Phone: [phone]   Fax:  [phone]  Name: Thomas Caldwell MRN: [id number] Date of Birth: [birth year]

## 2020-08-26 NOTE — Telephone Encounter (Signed)
**Note De-Identified Thomas Caldwell Obfuscation** Following message received from covermymeds: Angelena Sole Key: [facility] - PA Case ID: [id number] - Rx #: [id number] Outcome: N/A on July 27 The case has been cancelled/closed by health plan/payer/processor/PBM. Drug: Multaq 400MG  tablets Form: [employer] ePA Form Original Claim Info [id number]  I called [facility] and was advised that the pt picked up his Dronedarone 2 weeks ago for a 30 day supply and no PA was required at that time and the pt paid $0.00 for it. I did ask that they notify 73 ASAP if they find that a PA is needed for generic Dronedarone as the PA request was for name brand Multaq.

## 2020-08-27 NOTE — Progress Notes (Signed)
I, [name], LAT, ATC, am serving as scribe for Dr. [name].  Thomas Caldwell is a 75 y.o. male who presents to [facility] today for f/u of L knee pain / quad strain.  He was last seen by Dr. Denyse Amass on 07/10/20 and was referred to PT of which he has completed 7 sessions.  He was also prescribed nitroglycerin patches.  Since his last visit, pt reports that his L knee/quad is slightly worse in terms of the pain.  He has been using his nitroglycerin patches and has been going to PT.  He reports that his most aggravating activity is w/ transitioning from sitting to standing and vice-versa.  He notes that he is now having pain in both of his knees but does have a hx of B knee pain/OA.  Diagnostic imaging: L knee XR- 07/10/20  Pertinent review of systems: No fevers or chills  Relevant historical information: Hypertension    Exam:  BP 110/66 (BP Location: Right Arm, Patient Position: Sitting, Cuff Size: Normal)   Ht 6' (1.829 m)   Wt 197 lb (89.4 kg)   BMI 26.72 kg/m  General: Well Developed, well nourished, and in no acute distress.   MSK: left knee tender palpation distal quad tendon patella insertion.  Normal knee motion.  Pain with knee extension. Right knee mildly tender palpation distal quad tendon.  Mild pain with knee extension.    Lab and Radiology Results  EXAM: LEFT KNEE 3 VIEWS   COMPARISON:  Radiographs 01/02/2013   FINDINGS: Mild tricompartmental degenerative changes with chondrocalcinosis. No acute fracture or osteochondral lesion. Calcifications are noted in the region of the distal quadriceps tendon suggesting insertional tendinopathy. Could not exclude distal quadriceps injury. Recommend clinical correlation.   No definite joint effusion.   IMPRESSION: 1. No acute bony findings. 2. Degenerative changes and chondrocalcinosis. 3. Insertional quadriceps calcific tendinopathy or possible distal quadriceps injury. Recommend  clinical correlation.     Electronically Signed   By: Rudie Meyer M.D.   On: 07/10/2020 14:18  Diagnostic Limited MSK Ultrasound of: Left knee Quad tendon: Calcific change distal tendon insertion.  Some hypoechoic change near the lateral insertion site consistent with nonretracted partial tear.  No full-thickness tear visible. Patellar tendon normal. Medial lateral joint line slightly narrowed. Impression: Partial-thickness quad tear I, [name], personally (independently) visualized and performed the interpretation of the images attached in this note.  X-ray images right knee obtained today personally and independently interpreted Mild calcific change superior patella pole. Mild chondrocalcinosis. Mild degenerative changes.  No acute fractures. Await formal radiology review     Assessment and Plan: 75 y.o. male with bilateral knee pain left worse than right.  Left knee pain thought to be due to partial tear left quad tendon with chronic calcific tendinosis.  Patient is failing typical conservative management strategies including physical therapy and nitroglycerin patch protocol.  Plan for MRI to further characterize cause of knee pain and for potential future treatment plans.  Right knee pain also present but lesser.  He is also failing typical conservative management strategies including physical therapy.  Plan for x-ray today.   PDMP not reviewed this encounter. Orders Placed This Encounter  Procedures   DG Knee AP/LAT W/Sunrise Right    Standing Status:   Future    Number of Occurrences:   1    Standing Expiration Date:   08/28/2021    Order Specific Question:   Reason for Exam (SYMPTOM  OR  DIAGNOSIS REQUIRED)    Answer:   eval right knee pain    Order Specific Question:   Preferred imaging location?    Answer:   [facility]   MR Knee Left  Wo Contrast    Standing Status:   Future    Standing Expiration Date:   08/28/2021    Order Specific Question:   What is  the patient's sedation requirement?    Answer:   No Sedation    Order Specific Question:   Does the patient have a pacemaker or implanted devices?    Answer:   No    Order Specific Question:   Preferred imaging location?    Answer:   [facility] (table limit-350lbs)   No orders of the defined types were placed in this encounter.    Discussed warning signs or symptoms. Please see discharge instructions. Patient expresses understanding.   The above documentation has been reviewed and is accurate and complete [name], M.D.

## 2020-08-28 ENCOUNTER — Ambulatory Visit: Payer: Self-pay

## 2020-08-28 ENCOUNTER — Encounter: Payer: Self-pay | Admitting: Family Medicine

## 2020-08-28 ENCOUNTER — Ambulatory Visit (INDEPENDENT_AMBULATORY_CARE_PROVIDER_SITE_OTHER): Payer: 59 | Admitting: Family Medicine

## 2020-08-28 ENCOUNTER — Other Ambulatory Visit: Payer: Self-pay

## 2020-08-28 ENCOUNTER — Ambulatory Visit (INDEPENDENT_AMBULATORY_CARE_PROVIDER_SITE_OTHER): Payer: 59

## 2020-08-28 VITALS — BP 110/66 | Ht 72.0 in | Wt 197.0 lb

## 2020-08-28 DIAGNOSIS — M76899 Other specified enthesopathies of unspecified lower limb, excluding foot: Secondary | ICD-10-CM | POA: Diagnosis not present

## 2020-08-28 DIAGNOSIS — M25562 Pain in left knee: Secondary | ICD-10-CM

## 2020-08-28 DIAGNOSIS — G8929 Other chronic pain: Secondary | ICD-10-CM

## 2020-08-28 DIAGNOSIS — M25561 Pain in right knee: Secondary | ICD-10-CM | POA: Diagnosis not present

## 2020-08-28 NOTE — Patient Instructions (Signed)
Thank you for coming in today.   Please get an Xray today before you leave   You should hear from MRI scheduling within 1 week. If you do not hear please let me know.    Recheck after the MRI.    

## 2020-08-30 NOTE — Progress Notes (Signed)
Right knee x-ray shows some mild arthritis changes.

## 2020-09-03 ENCOUNTER — Other Ambulatory Visit: Payer: Self-pay

## 2020-09-03 ENCOUNTER — Ambulatory Visit: Payer: 59 | Admitting: Physical Therapy

## 2020-09-03 ENCOUNTER — Encounter: Payer: Self-pay | Admitting: Internal Medicine

## 2020-09-03 ENCOUNTER — Ambulatory Visit (INDEPENDENT_AMBULATORY_CARE_PROVIDER_SITE_OTHER): Payer: 59 | Admitting: Internal Medicine

## 2020-09-03 VITALS — BP 116/70 | HR 56 | Temp 98.3°F | Ht 72.0 in | Wt 199.0 lb

## 2020-09-03 DIAGNOSIS — R739 Hyperglycemia, unspecified: Secondary | ICD-10-CM

## 2020-09-03 DIAGNOSIS — I10 Essential (primary) hypertension: Secondary | ICD-10-CM | POA: Diagnosis not present

## 2020-09-03 DIAGNOSIS — L989 Disorder of the skin and subcutaneous tissue, unspecified: Secondary | ICD-10-CM | POA: Diagnosis not present

## 2020-09-03 DIAGNOSIS — M25561 Pain in right knee: Secondary | ICD-10-CM | POA: Diagnosis not present

## 2020-09-03 MED ORDER — TRAMADOL HCL 50 MG PO TABS
50.0000 mg | ORAL_TABLET | Freq: Four times a day (QID) | ORAL | 0 refills | Status: DC | PRN
Start: 2020-09-03 — End: 2020-10-17

## 2020-09-03 NOTE — Progress Notes (Signed)
Patient ID: Thomas Caldwell, male   DOB: 12-31-1945, 75 y.o.   MRN: [id number]        Chief Complaint: follow up right knee pain and swelling, senile purpura, and non healing skin lesions to left arm and left leg       HPI:  Thomas Caldwell is a 75 y.o. male here with 3 days onset right knee pain and swelling after getting OOB and turning to the side as he got up, severe, constant, much worse to get up and walk, has to push off with hands, no falls, and denies fever, chills, or other recent gout.  Also has new worsening purpura to arms and doesn't recall any trauma, but no other purpura, bruising or overt bleeding.  No hx of bleeding diathesis.  Also has 3 mo nonhealing skin lesions to left arm and left leg, declines derm for now.  Pt denies chest pain, increased sob or doe, wheezing, orthopnea, PND, increased LE swelling, palpitations, dizziness or syncope.   Pt denies polydipsia, polyuria, or new focal neuro s/s.   Pt denies fever, wt loss, night sweats, loss of appetite, or other constitutional symptoms       Wt Readings from Last 3 Encounters:  09/06/20 198 lb (89.8 kg)  09/03/20 199 lb (90.3 kg)  08/28/20 197 lb (89.4 kg)   BP Readings from Last 3 Encounters:  09/06/20 120/70  09/03/20 116/70  08/28/20 110/66         Past Medical History:  Diagnosis Date   ALLERGIC RHINITIS 01/11/2008   ANEURYSM 02/02/2007   brain   BRADYCARDIA, CHRONIC 10/06/2007   DIVERTICULOSIS, COLON 07/05/2007   EUSTACHIAN TUBE DYSFUNCTION 04/06/2009   GERD 07/05/2007   HYPERLIPIDEMIA 02/02/2007   HYPERTENSION 02/02/2007   Lumbago 01/11/2008   Peptic ulcer    PERSISTENT DISORDER INITIATING/MAINTAINING SLEEP 02/02/2007   Past Surgical History:  Procedure Laterality Date   APPENDECTOMY  1982   LEFT HEART CATH AND CORONARY ANGIOGRAPHY N/A 10/04/2019   Procedure: LEFT HEART CATH AND CORONARY ANGIOGRAPHY;  Surgeon: Lennette Bihari, MD;  Location: [facility];  Service: Cardiovascular;  Laterality: N/A;   LUMBAR  DISC SURGERY  12/2005   Dr. Venetia Maxon   [facility] Left brain  1992   due to brain aneurysm s/p repair    reports that he has never smoked. He has never used smokeless tobacco. He reports current alcohol use. He reports that he does not use drugs. family history includes Esophageal cancer in his brother; Heart disease in his father and sister; Hypertension in his brother and father. Allergies  Allergen Reactions   Codeine Other (See Comments)    Confusion   Current Outpatient Medications on File Prior to Visit  Medication Sig Dispense Refill   amLODipine (NORVASC) 10 MG tablet TAKE 1 TABLET(10 MG) BY MOUTH DAILY 90 tablet 2   aspirin 81 MG EC tablet Take 81 mg by mouth daily.     dronedarone (MULTAQ) 400 MG tablet Take 1 tablet (400 mg total) by mouth 2 (two) times daily with a meal. 60 tablet 11   guaiFENesin (MUCINEX) 600 MG 12 hr tablet Take 2 tablets (1,200 mg total) by mouth 2 (two) times daily as needed. 60 tablet 1   metoprolol succinate (TOPROL XL) 25 MG 24 hr tablet Take 1 tablet (25 mg total) by mouth daily. 90 tablet 3   naproxen (NAPROSYN) 500 MG tablet TAKE 1 TABLET(500 MG) BY MOUTH TWICE DAILY WITH A MEAL 60 tablet 2   nitroGLYCERIN (  NITRODUR - DOSED IN MG/24 HR) 0.2 mg/hr patch Apply 1/4 patch daily to tendon for tendonitis. 30 patch 1   omeprazole (PRILOSEC) 20 MG capsule Take 2 capsules (40 mg total) by mouth daily. 180 capsule 3   rosuvastatin (CRESTOR) 20 MG tablet TAKE 1 TABLET(20 MG) BY MOUTH DAILY 90 tablet 2   sacubitril-valsartan (ENTRESTO) 49-51 MG Take 1 tablet by mouth 2 (two) times daily. 60 tablet 11   Current Facility-Administered Medications on File Prior to Visit  Medication Dose Route Frequency Provider Last Rate Last Admin   sodium chloride flush (NS) 0.9 % injection 3 mL  3 mL Intravenous Q12H Duke Salvia, MD            ROS:  All others reviewed and negative.  Objective        PE:  BP 116/70 (BP Location: Left Arm, Patient Position: Sitting, Cuff Size:  Normal)   Pulse (!) 56   Temp 98.3 F (36.8 C) (Oral)   Ht 6' (1.829 m)   Wt 199 lb (90.3 kg)   SpO2 95%   BMI 26.99 kg/m                 Constitutional: Pt appears in NAD               HENT: Head: NCAT.                Right Ear: External ear normal.                 Left Ear: External ear normal.                Eyes: . Pupils are equal, round, and reactive to light. Conjunctivae and EOM are normal               Nose: without d/c or deformity               Neck: Neck supple. Gross normal ROM               Cardiovascular: Normal rate and regular rhythm.                 Pulmonary/Chest: Effort normal and breath sounds without rales or wheezing.                Right knee with 2+ effusion and tender over medial joint line               Neurological: Pt is alert. At baseline orientation, motor grossly intact               Skin: LE edema - none, has lesions to left mid post arm and left mid pretibial leg nontender, superficial, non draining                Psychiatric: Pt behavior is normal without agitation   Micro: none  Cardiac tracings I have personally interpreted today:  none  Pertinent Radiological findings (summarize): none   Lab Results  Component Value Date   WBC 8.9 11/01/2019   HGB 14.4 11/01/2019   HCT 43.2 11/01/2019   PLT 185.0 11/01/2019   GLUCOSE 96 12/28/2019   CHOL 143 11/01/2019   TRIG 79.0 11/01/2019   HDL 45.40 11/01/2019   LDLCALC 82 11/01/2019   ALT 26 11/01/2019   AST 25 11/01/2019   NA 140 12/28/2019   K 4.7 12/28/2019   CL 104 12/28/2019   CREATININE 1.20 12/28/2019   BUN 19 12/28/2019  CO2 24 12/28/2019   TSH 4.71 (H) 11/01/2019   PSA 1.12 11/01/2019   HGBA1C 5.8 11/01/2019   Assessment/Plan:  Thomas Caldwell is a 75 y.o. White or Caucasian [1] male with  has a past medical history of ALLERGIC RHINITIS (01/11/2008), ANEURYSM (02/02/2007), BRADYCARDIA, CHRONIC (10/06/2007), DIVERTICULOSIS, COLON (07/05/2007), EUSTACHIAN TUBE DYSFUNCTION  (04/06/2009), GERD (07/05/2007), HYPERLIPIDEMIA (02/02/2007), HYPERTENSION (02/02/2007), Lumbago (01/11/2008), Peptic ulcer, and PERSISTENT DISORDER INITIATING/MAINTAINING SLEEP (02/02/2007).  Right knee pain Exam c/w torque injury to knee, high suspicion for meniscal tear, for tramadol prn, f/u sport med,  to f/u any worsening symptoms or concerns  Skin lesion 2 non healing, suspicous for malignancy vs vasculitis, declines derm referral for now, will continue to follow  Hyperglycemia . Lab Results  Component Value Date   HGBA1C 5.8 11/01/2019   Stable, pt to continue current medical treatment - diet   Essential hypertension BP Readings from Last 3 Encounters:  09/06/20 120/70  09/03/20 116/70  08/28/20 110/66   Stable, pt to continue medical treatment norvasc, toprol, entresto  Followup: Return in about 13 weeks (around 12/03/2020).  Oliver Barre, MD 09/07/2020 4:01 PM [facility]

## 2020-09-03 NOTE — Patient Instructions (Signed)
Please take all new medication as prescribed - the pain medication  Please continue all other medications as before, and refills have been done if requested.  Please have the pharmacy call with any other refills you may need.  Please continue your efforts at being more active, low cholesterol diet, and weight control.  Please keep your appointments with your specialists as you may have planned   

## 2020-09-05 ENCOUNTER — Ambulatory Visit: Payer: 59 | Admitting: Family Medicine

## 2020-09-05 ENCOUNTER — Ambulatory Visit: Payer: 59 | Admitting: Physical Therapy

## 2020-09-05 NOTE — Progress Notes (Signed)
Thomas Caldwell, am serving as a Neurosurgeon for Dr. Antoine Primas.   Thomas Caldwell is a 75 y.o. male who presents to [facility] at [facility] today for f/u of R knee pain.  He was last seen by Dr. Denyse Amass on 08/28/20 for f/u of L knee pain and L quad strain and noted having pain in his R knee as well.  Today, pt reports that he was trying to get up out of a chair on Saturday and the chair tilted and he twisted his Right knee. Locates pain to around the patella   He feels as though his leg will give way on him at times with standing.  R knee swelling: yes  R knee mechanical symptoms: no Aggravating factors: going from a sitting to standing position  Treatments tried: ice, tylenol   Diagnostic testing: R knee XR- 08/28/20  Pertinent review of systems: No fevers or chills  Relevant historical information: Left knee pain due to suspected quad tendon tear.  MRI pending this weekend.   Exam:  BP 120/70 (BP Location: Left Arm, Patient Position: Sitting, Cuff Size: Normal)   Pulse 67   Ht 6' (1.829 m)   Wt 198 lb (89.8 kg)   SpO2 95%   BMI 26.85 kg/m  General: Well Developed, well nourished, and in no acute distress.   MSK: Right knee normal-appearing Tender palpation along anterior medial patella area. Range of motion 5-110 degrees. Pain with resisted knee extension although strength is preserved. Stable ligamentous exam. Pulses cap refill and sensation are intact distally.    Lab and Radiology Results  Diagnostic Limited MSK Ultrasound of: Right knee Quad tendon intact.  However hyperechoic change at distal tendon insertion consistent with chronic calcific tendinopathy. Patellar tendon normal. Lateral joint line degenerative burst extruded lateral meniscus.  Medial joint line degenerative pressure extruded medial meniscus.  Intact MCL. 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     Assessment and Plan: 75  y.o. male with right knee injury occurring recently.  Unfortunately patient is having pretty significant knee symptoms and the feeling of knee instability especially with standing.  Ultrasound was somewhat reassuring today.  Plan for lockout hinged knee brace to help stabilize his knee and reduce risk of fall.  He has a pending left knee MRI to evaluate for possible quad tear this weekend and will be return to clinic next week.  Plan to recheck next week as normally scheduled and reassess.  Likely will proceed with physical therapy home exercises for the right leg.   PDMP not reviewed this encounter. Orders Placed This Encounter  Procedures   [place] LIMITED JOINT SPACE STRUCTURES LOW RIGHT(NO LINKED CHARGES)    Standing Status:   Future    Number of Occurrences:   1    Standing Expiration Date:   09/06/2021    Order Specific Question:   Reason for Exam (SYMPTOM  OR DIAGNOSIS REQUIRED)    Answer:   Right knee pain    Order Specific Question:   Preferred imaging location?    Answer:   [facility]   [place] LIMITED JOINT SPACE STRUCTURES LOW RIGHT(NO LINKED CHARGES)    Order Specific Question:   Reason for Exam (SYMPTOM  OR DIAGNOSIS REQUIRED)    Answer:   rt knee    Order Specific Question:   Preferred imaging location?    Answer:   [facility]   No orders of the defined types  were placed in this encounter.    Discussed warning signs or symptoms. Please see discharge instructions. Patient expresses understanding.   The above documentation has been reviewed and is accurate and complete [name], M.D.

## 2020-09-06 ENCOUNTER — Ambulatory Visit: Payer: Self-pay

## 2020-09-06 ENCOUNTER — Encounter: Payer: Self-pay | Admitting: Family Medicine

## 2020-09-06 ENCOUNTER — Ambulatory Visit (INDEPENDENT_AMBULATORY_CARE_PROVIDER_SITE_OTHER): Payer: 59 | Admitting: Family Medicine

## 2020-09-06 ENCOUNTER — Other Ambulatory Visit: Payer: Self-pay

## 2020-09-06 VITALS — BP 120/70 | HR 67 | Ht 72.0 in | Wt 198.0 lb

## 2020-09-06 DIAGNOSIS — S76109A Unspecified injury of unspecified quadriceps muscle, fascia and tendon, initial encounter: Secondary | ICD-10-CM

## 2020-09-06 DIAGNOSIS — M25561 Pain in right knee: Secondary | ICD-10-CM

## 2020-09-06 NOTE — Patient Instructions (Addendum)
Good to see you today.  Please get an Xray today before you leave.  You will be getting an MRI on Saturday so please follow-up with me next Wed or Thursday.  Use the brace.

## 2020-09-07 ENCOUNTER — Encounter: Payer: Self-pay | Admitting: Internal Medicine

## 2020-09-07 NOTE — Assessment & Plan Note (Signed)
BP Readings from Last 3 Encounters:  09/06/20 120/70  09/03/20 116/70  08/28/20 110/66   Stable, pt to continue medical treatment norvasc, toprol, entresto

## 2020-09-07 NOTE — Assessment & Plan Note (Signed)
2 non healing, suspicous for malignancy vs vasculitis, declines derm referral for now, will continue to follow

## 2020-09-07 NOTE — Assessment & Plan Note (Signed)
.   Lab Results  Component Value Date   HGBA1C 5.8 11/01/2019   Stable, pt to continue current medical treatment - diet

## 2020-09-07 NOTE — Assessment & Plan Note (Addendum)
Exam c/w torque injury to knee, high suspicion for meniscal tear, for tramadol prn, f/u sport med,  to f/u any worsening symptoms or concerns

## 2020-09-08 ENCOUNTER — Other Ambulatory Visit: Payer: Self-pay

## 2020-09-08 ENCOUNTER — Ambulatory Visit (INDEPENDENT_AMBULATORY_CARE_PROVIDER_SITE_OTHER): Payer: 59

## 2020-09-08 DIAGNOSIS — M25561 Pain in right knee: Secondary | ICD-10-CM

## 2020-09-08 DIAGNOSIS — G8929 Other chronic pain: Secondary | ICD-10-CM

## 2020-09-08 DIAGNOSIS — M25562 Pain in left knee: Secondary | ICD-10-CM

## 2020-09-08 DIAGNOSIS — M76899 Other specified enthesopathies of unspecified lower limb, excluding foot: Secondary | ICD-10-CM

## 2020-09-08 DIAGNOSIS — M76892 Other specified enthesopathies of left lower limb, excluding foot: Secondary | ICD-10-CM

## 2020-09-09 NOTE — Progress Notes (Signed)
MRI left knee shows high-grade partial tear of the central deep fibers of the quad tendon.  You do have a large meniscus tear of the posterior medial meniscus.  You do have some arthritis changes in the knee as well.  We will discuss this in full detail at follow-up appointment scheduled on the 17th.

## 2020-09-10 ENCOUNTER — Other Ambulatory Visit: Payer: Self-pay | Admitting: Physical Therapy

## 2020-09-10 DIAGNOSIS — M25561 Pain in right knee: Secondary | ICD-10-CM

## 2020-09-10 NOTE — Progress Notes (Signed)
R knee XR not completed at last visit.

## 2020-09-10 NOTE — Progress Notes (Signed)
I, [name], LAT, ATC, am serving as scribe for Dr. [name].  Thomas Caldwell is a 75 y.o. male who presents to [facility] at [facility] today for f/u of B knee pain and L knee MRI review.  He was last seen by Dr. Denyse Amass on 09/06/20 w/ acute onset R knee pain after twisting his knee upon standing from a chair.  He was placed into a locking, hinged knee brace.  Today, pt reports that his R knee is a little better but con't to have pain at his R superior patella.  He states that his L knee is also slightly improved in that he is now able to descend stairs.  He has been using ice on his L knee w/ a Cryocuff-type device.  He has been wearing his R knee brace but does not have it on today.  Diagnostic testing: L knee MRI- 09/08/20; R knee XR- 07/10/20; L knee XR- 07/10/20   Pertinent review of systems: No fevers or chills  Relevant historical information: Hypertension.   Exam:  BP 112/60 (BP Location: Left Arm, Patient Position: Sitting, Cuff Size: Normal)   Pulse 71   Ht 6' (1.829 m)   Wt 198 lb (89.8 kg)   SpO2 96%   BMI 26.85 kg/m  General: Well Developed, well nourished, and in no acute distress.   MSK: Left knee normal-appearing Tender palpation distal quad tendon.  Nontender medial joint line. Normal range of motion.   Pain with knee extension located to distal quad tendon. Strength 4+/5 extension  Right knee normal-appearing Range of motion lacks full extension by 5 degrees. Tender palpation distal quad tendon.  Nontender otherwise. Pain with knee extension located distal quad tendon. Strength 4/5 to extension.    Antalgic gait   Lab and Radiology Results No results found for this or any previous visit (from the past 72 hour(s)). MR Knee Left  Wo Contrast  Result Date: 09/09/2020 CLINICAL DATA:  Chronic knee pain for years. No known injury or prior relevant surgery. EXAM: MRI OF THE LEFT KNEE WITHOUT CONTRAST TECHNIQUE: Multiplanar, multisequence  MR imaging of the knee was performed. No intravenous contrast was administered. COMPARISON:  Radiographs 07/10/2020 FINDINGS: MENISCI Medial meniscus: Large oblique tear involving the posterior horn and body of the medial meniscus, extending to the inferior articular surface. The meniscal root is intact. No centrally displaced meniscal fragment identified. Lateral meniscus: Mild free edge fraying of the meniscal body. No meniscal tear or displaced fragment. LIGAMENTS Cruciates:  Intact. Collaterals: Intact. The medial collateral ligament is mildly thickened, and there is spurring at its femoral attachment consistent with remote injury. CARTILAGE Patellofemoral: Mild patellar chondral thinning and surface irregularity without focal defect. Medial:  Minimal chondral thinning without focal defect. Lateral:  Preserved. MISCELLANEOUS Joint:  No significant joint effusion. Popliteal Fossa: Collection of T2 hyperintense nodular foci posterior to the distal femoral metaphysis, likely synovial cysts. The largest component measures 11 mm on image 6/3. No osseous erosion. These are less likely to reflect varicosities. Extensor Mechanism: Distal quadriceps tendinosis with high-grade partial tearing of the deep central layer (vastus intermedius tendon fibers), best seen on the sagittal images. There is approximately 4 mm of tendon retraction on sagittal image 17/8. The superficial rectus femoris components appear intact. The patellar tendon appears normal. Bones:  No acute or significant extra-articular osseous findings. Other: No other significant periarticular soft tissue findings. IMPRESSION: 1. Large oblique undersurface tear of the posterior horn and body of the medial meniscus.  2. High-grade partial tear involving the central deep fibers of the quadriceps tendon. 3. Intact lateral meniscus, cruciate and collateral ligaments. 4. Minimal degenerative chondrosis in the medial and patellofemoral compartments. Electronically  Signed   By: Carey Bullocks M.D.   On: 09/09/2020 11:41    I, [name], personally (independently) visualized and performed the interpretation of the images attached in this note.  X-ray images right knee obtained today personally and independently interpreted.  Mild degenerative changes.  No acute fractures. Await formal radiology review   Assessment and Plan: 75 y.o. male with left knee pain due to partial quadriceps tendon tear.  The meniscus tear visible on MRI is present but probably not significant source of pain as it is not painful in this area.    He also unfortunately has a very similar issue in his right knee.  He suffered an acute right knee injury and on ultrasound is concerning for potential partial right quadriceps tendon tear.  This has not improved in 1 week and is quite symptomatic.  Plan for MRI to further characterize cause of pain and for potential surgical planning.  Both knees are impacting each other.  Surgery will be very challenging because he effectively does not have a good leg to stand on.  Anticipate that the likely best option will be PRP injection of the most effective side and.  We will determine treatment plan once MRI is back on the right knee.  Recommend continued stabilizing knee brace and a walker.  Continue straight leg raise and quad strengthening exercises as tolerated.   PDMP not reviewed this encounter. Orders Placed This Encounter  Procedures   MR Knee Right Wo Contrast    Standing Status:   Future    Standing Expiration Date:   09/11/2021    Order Specific Question:   What is the patient's sedation requirement?    Answer:   No Sedation    Order Specific Question:   Does the patient have a pacemaker or implanted devices?    Answer:   No    Order Specific Question:   Preferred imaging location?    Answer:   [facility] (table limit-350lbs)   No orders of the defined types were placed in this encounter.    Discussed warning signs  or symptoms. Please see discharge instructions. Patient expresses understanding.   The above documentation has been reviewed and is accurate and complete [name], M.D.

## 2020-09-11 ENCOUNTER — Encounter: Payer: Self-pay | Admitting: Family Medicine

## 2020-09-11 ENCOUNTER — Other Ambulatory Visit: Payer: Self-pay

## 2020-09-11 ENCOUNTER — Ambulatory Visit (INDEPENDENT_AMBULATORY_CARE_PROVIDER_SITE_OTHER): Payer: 59

## 2020-09-11 ENCOUNTER — Ambulatory Visit (INDEPENDENT_AMBULATORY_CARE_PROVIDER_SITE_OTHER): Payer: 59 | Admitting: Family Medicine

## 2020-09-11 VITALS — BP 112/60 | HR 71 | Ht 72.0 in | Wt 198.0 lb

## 2020-09-11 DIAGNOSIS — M25561 Pain in right knee: Secondary | ICD-10-CM

## 2020-09-11 DIAGNOSIS — M76899 Other specified enthesopathies of unspecified lower limb, excluding foot: Secondary | ICD-10-CM | POA: Diagnosis not present

## 2020-09-11 NOTE — Patient Instructions (Addendum)
Thank you for coming in today.   You should hear from MRI scheduling within 1 week. If you do not hear please let me know.    Anticipate surgical consultation following MRI.   Anticipate physical therapy attempt after MRI as well.   I may also plan for PRP injection after the MRI.

## 2020-09-12 NOTE — Progress Notes (Signed)
Right knee x-ray shows some mild arthritis but otherwise looks okay to radiology.

## 2020-09-16 ENCOUNTER — Encounter: Payer: Self-pay | Admitting: Family Medicine

## 2020-09-28 ENCOUNTER — Ambulatory Visit (INDEPENDENT_AMBULATORY_CARE_PROVIDER_SITE_OTHER): Payer: 59

## 2020-09-28 ENCOUNTER — Other Ambulatory Visit: Payer: Self-pay

## 2020-09-28 DIAGNOSIS — M25561 Pain in right knee: Secondary | ICD-10-CM | POA: Diagnosis not present

## 2020-09-28 DIAGNOSIS — M76899 Other specified enthesopathies of unspecified lower limb, excluding foot: Secondary | ICD-10-CM

## 2020-09-29 ENCOUNTER — Other Ambulatory Visit: Payer: 59

## 2020-10-01 NOTE — Progress Notes (Signed)
MRI of the right knee shows advanced quadriceps tendinitis with partial thickness tear of the quadriceps tendon. Medial and lateral meniscus tears are present. Prepatellar bursitis is present. Stable benign-appearing intraosseous ganglion cyst is present in the proximal fibula bone.  Recommend return to clinic to go over the MRI in further detail and also have a checkup to see how both knees are doing.

## 2020-10-03 NOTE — Progress Notes (Signed)
I, Thomas Caldwell, LAT, ATC, am serving as scribe for Dr. [name].  Thomas Caldwell is a 75 y.o. male who presents to [facility] at [facility] today for B knee pain f/u and R knee MRI review.  He was last seen by Dr. Denyse Amass on 09/11/20 and noted slight improvement in both of his knees.  He con't to wear his locking, hinged knee brace and was also advised to use an AD such as a walker.  He was to con't his HEP and follow-up after his R knee MRI.  Today, pt reports that his R knee is feeling worse.  He notes worsening R knee mechanical symptoms.  He wears his locking, hinged knee brace w/ activity.    Diagnostic testing: R knee MRI- 09/28/20; R knee XR- 09/11/20 and 08/28/20; L knee MRI- 09/08/20; L knee XR- 07/10/20   Pertinent review of systems: No fevers or chills  Relevant historical information: Heart disease with history of ventricular tachycardia.  Does take 81 mg aspirin daily.   Exam:  BP 120/66 (BP Location: Left Arm, Patient Position: Sitting, Cuff Size: Normal)   Pulse (!) 53   Ht 6' (1.829 m)   Wt 198 lb 12.8 oz (90.2 kg)   SpO2 97%   BMI 26.96 kg/m  General: Well Developed, well nourished, and in no acute distress.   MSK: Right knee normal-appearing tender palpation distal quadriceps tendon.  Pain with knee extension.  Strength 4/5 to extension limited by pain.    Lab and Radiology Results    EXAM: MRI OF THE RIGHT KNEE WITHOUT CONTRAST   TECHNIQUE: Multiplanar, multisequence MR imaging of the knee was performed. No intravenous contrast was administered.   COMPARISON:  X-ray 09/11/2020, MRI 01/16/2013   FINDINGS: MENISCI   Medial meniscus: Extensive oblique undersurface tear of the medial meniscus, progressed from prior. Tear extends into the posterior root attachment site. Small flap of meniscal tissue at the midbody is displaced into the inferior gutter.   Lateral meniscus: Extensive horizontal tear of the lateral meniscus with  involvement of the anterior and posterior root attachment sites. No displaced flap or fragment. Tear has progressed from prior.   LIGAMENTS   Cruciates:  Intact ACL and PCL.   Collaterals: Medial collateral ligament is intact. Lateral collateral ligament complex is intact.   CARTILAGE   Patellofemoral:  No chondral defect.   Medial:  No chondral defect.   Lateral: Partial-thickness chondral thinning of the weight-bearing lateral compartment, progressed from prior.   Joint: Trace knee joint effusion. Hoffa's fat within normal limits.   Popliteal Fossa:  No Baker cyst. Intact popliteus tendon.   Extensor Mechanism: Advanced distal quadriceps tendinosis with partial-thickness insertional tearing along the medial aspect of the distal tendon. Intact patellar tendon with mild tendinosis proximally.   Bones: Stable 1.8 x 1.0 x 1.2 cm cystic lesion within the fibular head most compatible with a intraosseous ganglion or geode, benign. No fracture or malalignment. No suspicious marrow replacing bone lesion.   Other: Thin prepatellar bursal fluid collection measuring approximately 4.4 x 0.4 x 2.6 cm.   IMPRESSION: 1. Extensive oblique undersurface tear of the medial meniscus, progressed from prior. Tear extends into the posterior root attachment site. Small flap of meniscal tissue at the midbody is displaced into the inferior gutter. 2. Extensive horizontal tear of the lateral meniscus with involvement of the anterior and posterior root attachment sites, progressed from prior. 3. Advanced distal quadriceps tendinosis with partial-thickness insertional tearing along the medial  aspect of the distal tendon. 4. Trace knee joint effusion. 5. Prepatellar bursal fluid collection measuring approximately 4.4 x 0.4 x 2.6 cm. 6. Stable benign 1.8 cm intraosseous ganglion or geode within the proximal fibula.     Electronically Signed   By: Duanne Guess D.O.   On: 10/01/2020  11:01 I, [name], personally (independently) visualized and performed the interpretation of the images attached in this note.     Assessment and Plan: 75 y.o. male with right knee pain primarily due to the partial quadriceps tendon tear seen on MRI.  He does have degenerative changes and meniscus tears as well but this is a much lesser issue.  He is failing typical conservative management strategies including home exercise program and partial immobilization.  Discussed options.  Plan for trial of PRP injection if this should fail we will have consultation with orthopedic surgery.  He would like to avoid surgery for this and I do think that surgery is probably not the ideal treatment because he also has a similar issue in his left knee although less severe.  I think he would have a challenging time with the postsurgical recovery.  Return in 1 week for PRP injection.  He will need to stop the aspirin now.    Discussed warning signs or symptoms. Please see discharge instructions. Patient expresses understanding.   The above documentation has been reviewed and is accurate and complete [name], M.D.  Total encounter time 20 minutes including face-to-face time with the patient and, reviewing past medical record, and charting on the date of service.   Discussed MRI findings treatment plan and options.

## 2020-10-04 ENCOUNTER — Ambulatory Visit: Payer: 59 | Admitting: Family Medicine

## 2020-10-04 ENCOUNTER — Encounter: Payer: Self-pay | Admitting: Family Medicine

## 2020-10-04 ENCOUNTER — Other Ambulatory Visit: Payer: Self-pay

## 2020-10-04 VITALS — BP 120/66 | HR 53 | Ht 72.0 in | Wt 198.8 lb

## 2020-10-04 DIAGNOSIS — M76899 Other specified enthesopathies of unspecified lower limb, excluding foot: Secondary | ICD-10-CM

## 2020-10-04 NOTE — Patient Instructions (Signed)
Thank you for coming in today.   Plan for PRP next week (Friday).   STOP aspirin now.   Arrive a little early for the blood draw.

## 2020-10-07 ENCOUNTER — Ambulatory Visit: Payer: 59 | Admitting: Family Medicine

## 2020-10-11 ENCOUNTER — Ambulatory Visit (INDEPENDENT_AMBULATORY_CARE_PROVIDER_SITE_OTHER): Payer: Self-pay | Admitting: Family Medicine

## 2020-10-11 ENCOUNTER — Ambulatory Visit: Payer: Self-pay

## 2020-10-11 ENCOUNTER — Other Ambulatory Visit: Payer: Self-pay

## 2020-10-11 DIAGNOSIS — M76899 Other specified enthesopathies of unspecified lower limb, excluding foot: Secondary | ICD-10-CM

## 2020-10-11 DIAGNOSIS — M25561 Pain in right knee: Secondary | ICD-10-CM

## 2020-10-11 NOTE — Progress Notes (Signed)
Harvie Heck presents to clinic today for PRP injection right quadriceps tendon tear/tendinitis  Procedure: Real-time Ultrasound Guided Injection of right distal quad tendon medial aspect Device: Philips Affiniti 50G Images permanently stored and available for review in PACS Verbal informed consent obtained.  Discussed risks and benefits of procedure. Warned about infection bleeding damage to structures skin hypopigmentation and fat atrophy among others. Patient expresses understanding and agreement Time-out conducted.   Noted no overlying erythema, induration, or other signs of local infection.   Skin prepped in a sterile fashion.   Local anesthesia: Topical Ethyl chloride.   With sterile technique and under real time ultrasound guidance: 5 mL of lidocaine injected subcutaneously and into the quadriceps tendon area prior to PRP injection. Skin sterilized with isopropyl alcohol again. PRP 5 mL injected into distal quadriceps tendon. Fluid seen entering the quad tendon.   Completed without difficulty    Advised to call if fevers/chills, erythema, induration, drainage, or persistent bleeding.   Images permanently stored and available for review in the ultrasound unit.  Impression: Technically successful ultrasound guided injection.   Plan for graduated return to exercise.  Refer back to PT.

## 2020-10-11 NOTE — Patient Instructions (Addendum)
Good to see you today.  You had a PRP injection in your R knee today.  Please follow the home exercise program as detailed in the handout we provided you.  The numbing medicine will wear off ina few hours. Avoid ibuprofen or aleve for pain.  Tylenol is ok.   If you are having a real problem let me know.  My cell phone number for emergencies is [phone]. Text me first.

## 2020-10-14 ENCOUNTER — Encounter: Payer: Self-pay | Admitting: Family Medicine

## 2020-10-17 ENCOUNTER — Encounter: Payer: Self-pay | Admitting: Internal Medicine

## 2020-10-17 MED ORDER — TRAMADOL HCL 50 MG PO TABS: 50.0000 mg | ORAL_TABLET | Freq: Four times a day (QID) | ORAL | 2 refills | Status: DC | PRN

## 2020-10-23 ENCOUNTER — Ambulatory Visit: Payer: 59 | Admitting: Internal Medicine

## 2020-10-23 ENCOUNTER — Other Ambulatory Visit: Payer: Self-pay

## 2020-10-23 ENCOUNTER — Encounter: Payer: Self-pay | Admitting: Internal Medicine

## 2020-10-23 VITALS — BP 138/80 | HR 52 | Ht 72.0 in | Wt 198.4 lb

## 2020-10-23 DIAGNOSIS — R002 Palpitations: Secondary | ICD-10-CM | POA: Diagnosis not present

## 2020-10-23 DIAGNOSIS — I472 Ventricular tachycardia, unspecified: Secondary | ICD-10-CM

## 2020-10-23 NOTE — Progress Notes (Signed)
8.  8 BHU.  He will     Patient Care Team: Corwin Levins, MD as PCP - Isaiah Blakes, MD as PCP - Electrophysiology (Cardiology)   HPI  Thomas Caldwell is a 75 y.o. 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  Associate with left bundle branch block and discontinued Saw Dr. Merita Norton in consultation regarding catheter ablation; elected drug therapy given the concern of noncompaction and at least one of his morphologies (had a dominant one plus a scattering of others) and was started on dronaderone  No significant floaters.  No impairment in exercise.  Unfortunately, he fell and hurt his quadriceps tendons I think bilaterally.  Limiting exercise.  DATE TEST EF   9/21 Echo   30-35 %   9/21 LHC  <25 % LADm-60  10/21 cMRI 34% LV dilated Prominent trabeculations  ? LVNC LGE neg  12/21 Echo  30-35%    Date PVCs  9/21 7.9  6/22 7.9   Date Cr K Hgb  12/21 1.2 4.7 14.4             Records and Results Reviewed   Past Medical History:  Diagnosis Date   ALLERGIC RHINITIS 01/11/2008   ANEURYSM 02/02/2007   brain   BRADYCARDIA, CHRONIC 10/06/2007   DIVERTICULOSIS, COLON 07/05/2007   EUSTACHIAN TUBE DYSFUNCTION 04/06/2009   GERD 07/05/2007   HYPERLIPIDEMIA 02/02/2007   HYPERTENSION 02/02/2007   Lumbago 01/11/2008   Peptic ulcer    PERSISTENT DISORDER INITIATING/MAINTAINING SLEEP 02/02/2007    Past Surgical History:  Procedure Laterality Date   APPENDECTOMY  1982   LEFT HEART CATH AND CORONARY ANGIOGRAPHY N/A 10/04/2019   Procedure: LEFT HEART CATH AND CORONARY ANGIOGRAPHY;  Surgeon: Lennette Bihari, MD;  Location: [facility];  Service: Cardiovascular;  Laterality: N/A;   LUMBAR DISC SURGERY  12/2005   Dr. Venetia Maxon   [facility] Left brain  1992   due to brain aneurysm s/p repair    Current Meds  Medication Sig   amLODipine (NORVASC) 10 MG tablet TAKE 1 TABLET(10 MG) BY MOUTH DAILY   aspirin 81 MG EC tablet  Take 81 mg by mouth daily.   dronedarone (MULTAQ) 400 MG tablet Take 1 tablet (400 mg total) by mouth 2 (two) times daily with a meal.   guaiFENesin (MUCINEX) 600 MG 12 hr tablet Take 2 tablets (1,200 mg total) by mouth 2 (two) times daily as needed.   metoprolol succinate (TOPROL XL) 25 MG 24 hr tablet Take 1 tablet (25 mg total) by mouth daily.   naproxen (NAPROSYN) 500 MG tablet TAKE 1 TABLET(500 MG) BY MOUTH TWICE DAILY WITH A MEAL   nitroGLYCERIN (NITRODUR - DOSED IN MG/24 HR) 0.2 mg/hr patch Apply 1/4 patch daily to tendon for tendonitis.   omeprazole (PRILOSEC) 20 MG capsule Take 2 capsules (40 mg total) by mouth daily.   rosuvastatin (CRESTOR) 20 MG tablet TAKE 1 TABLET(20 MG) BY MOUTH DAILY   sacubitril-valsartan (ENTRESTO) 49-51 MG Take 1 tablet by mouth 2 (two) times daily.   traMADol (ULTRAM) 50 MG tablet Take 1 tablet (50 mg total) by mouth every 6 (six) hours as needed.   Current Facility-Administered Medications for the 10/23/20 encounter (Office Visit) with Duke Salvia, MD  Medication   sodium chloride flush (NS) 0.9 % injection 3 mL    Allergies  Allergen Reactions   Codeine Other (See Comments)    Confusion  Review of Systems negative except from HPI and PMH  Physical Exam BP 138/80   Pulse (!) 52   Ht 6' (1.829 m)   Wt 198 lb 6.4 oz (90 kg)   SpO2 96%   BMI 26.91 kg/m  Well developed and well nourished in no acute distress HENT normal Neck supple with JVP-flat Clear Irregular rate and rhythm, no  gallop murmur Abd-soft with active BS No Clubbing cyanosis  edema Skin-warm and dry A & Oriented  Grossly normal sensory and motor function  ECG sinus at 52 Intervals 16/09/42 PVCs right bundle superior axis      Assessment and  Plan  Ventricular tachycardia Nonsustained   Cardiomyopathy?  Left ventricular noncompaction   Bradycardia-sinus    Tolerating the current interim.  PVC burden seems to be less although the measured PVC burden in  June was largely unchanged.  Not particularly sanguine that EF will improve but we will plan to repeat his echo prior to next visit.  For now continue Entresto 49/51 and metoprolol 25 daily up titration which is limited by his bradycardia  Continue dronaderone 40 mg twice daily  Blood pressure will controlled - continue the above and amlodipine 10    Current medicines are reviewed at length with the patient today .  The patient does not  have concerns regarding medicines.

## 2020-10-23 NOTE — Patient Instructions (Signed)
Medication Instructions:  Your physician recommends that you continue on your current medications as directed. Please refer to the Current Medication list given to you today.  *If you need a refill on your cardiac medications before your next appointment, please call your pharmacy*   Lab Work: None ordered.  If you have labs (blood work) drawn today and your tests are completely normal, you will receive your results only by: MyChart Message (if you have MyChart) OR A paper copy in the mail If you have any lab test that is abnormal or we need to change your treatment, we will call you to review the results.   Testing/Procedures:  Your physician has requested that you have an echocardiogram. Echocardiography is a painless test that uses sound waves to create images of your heart. It provides your doctor with information about the size and shape of your heart and how well your heart's chambers and valves are working. This procedure takes approximately one hour. There are no restrictions for this procedure.    Follow-Up: At [facility], you and your health needs are our priority.  As part of our continuing mission to provide you with exceptional heart care, we have created designated Provider Care Teams.  These Care Teams include your primary Cardiologist (physician) and Advanced Practice Providers (APPs -  Physician Assistants and Nurse Practitioners) who all work together to provide you with the care you need, when you need it.  We recommend signing up for the patient portal called "MyChart".  Sign up information is provided on this After Visit Summary.  MyChart is used to connect with patients for Virtual Visits (Telemedicine).  Patients are able to view lab/test results, encounter notes, upcoming appointments, etc.  Non-urgent messages can be sent to your provider as well.   To learn more about what you can do with MyChart, go to [email or web address].    Your next appointment:   6  month(s)  The format for your next appointment:   In Person  Provider:   Steven Klein, MD  

## 2020-10-29 ENCOUNTER — Ambulatory Visit: Payer: 59 | Attending: Family Medicine | Admitting: Rehabilitative and Restorative Service Providers"

## 2020-10-29 ENCOUNTER — Other Ambulatory Visit: Payer: Self-pay | Admitting: Internal Medicine

## 2020-10-29 ENCOUNTER — Other Ambulatory Visit: Payer: Self-pay

## 2020-10-29 ENCOUNTER — Encounter: Payer: Self-pay | Admitting: Rehabilitative and Restorative Service Providers"

## 2020-10-29 DIAGNOSIS — R262 Difficulty in walking, not elsewhere classified: Secondary | ICD-10-CM

## 2020-10-29 DIAGNOSIS — M25561 Pain in right knee: Secondary | ICD-10-CM | POA: Diagnosis not present

## 2020-10-29 DIAGNOSIS — R600 Localized edema: Secondary | ICD-10-CM | POA: Diagnosis present

## 2020-10-29 DIAGNOSIS — M545 Low back pain, unspecified: Secondary | ICD-10-CM | POA: Insufficient documentation

## 2020-10-29 DIAGNOSIS — M6281 Muscle weakness (generalized): Secondary | ICD-10-CM

## 2020-10-29 DIAGNOSIS — M25562 Pain in left knee: Secondary | ICD-10-CM | POA: Diagnosis present

## 2020-10-29 DIAGNOSIS — G8929 Other chronic pain: Secondary | ICD-10-CM

## 2020-10-29 NOTE — Therapy (Signed)
[facility] [address] [place], [place], [address] Phone: [phone]   Fax:  [phone]  Physical Therapy Evaluation  Patient Details  Name: Thomas Caldwell MRN: [id number] Date of Birth: July 12, 1945 Referring Provider (PT): Dr [name]   Encounter Date: 10/29/2020   PT End of Session - 10/29/20 1253     Visit Number 1    Date for PT Re-Evaluation 12/20/20    Authorization Type [employer]    PT Start Time 1227    PT Stop Time 1308    PT Time Calculation (min) 41 min    Activity Tolerance Patient tolerated treatment well    Behavior During Therapy [facility] for tasks assessed/performed             Past Medical History:  Diagnosis Date   ALLERGIC RHINITIS 01/11/2008   ANEURYSM 02/02/2007   brain   BRADYCARDIA, CHRONIC 10/06/2007   DIVERTICULOSIS, COLON 07/05/2007   EUSTACHIAN TUBE DYSFUNCTION 04/06/2009   GERD 07/05/2007   HYPERLIPIDEMIA 02/02/2007   HYPERTENSION 02/02/2007   Lumbago 01/11/2008   Peptic ulcer    PERSISTENT DISORDER INITIATING/MAINTAINING SLEEP 02/02/2007    Past Surgical History:  Procedure Laterality Date   APPENDECTOMY  1982   LEFT HEART CATH AND CORONARY ANGIOGRAPHY N/A 10/04/2019   Procedure: LEFT HEART CATH AND CORONARY ANGIOGRAPHY;  Surgeon: Lennette Bihari, MD;  Location: [facility];  Service: Cardiovascular;  Laterality: N/A;   LUMBAR DISC SURGERY  12/2005   Dr. Venetia Maxon   [facility] Left brain  1992   due to brain aneurysm s/p repair    There were no vitals filed for this visit.    Subjective Assessment - 10/29/20 1230     Subjective Pt reports that he was doing better from his L knee and went to stand up and when he pushed to stand, the chair went out from him and he lost his balance and hurt his R knee.  He states that stairs are diffiuclt for him and he requires a rail.    Pertinent History x/p Platelet Rich Plasma injection on 10/11/20 from Dr [name]    Limitations Other (comment)   stairs    Patient Stated Goals To be able to play golf and walk.    Currently in Pain? Yes    Pain Score 4     Pain Location Knee    Pain Orientation Right    Pain Descriptors / Indicators Dull    Pain Type Acute pain                [facility] PT Assessment - 10/29/20 0001       Assessment   Medical Diagnosis Quadriceps tendonitis, Acute pain of right knee    Referring Provider (PT) Dr [name]    Onset Date/Surgical Date 10/05/20    Hand Dominance Right    Next MD Visit to schedule 6 week follow up    Prior Therapy Yes for L knee      Precautions   Precautions Fall      Restrictions   Weight Bearing Restrictions No      Balance Screen   Has the patient fallen in the past 6 months Yes    How many times? 1    Has the patient had a decrease in activity level because of a fear of falling?  No    Is the patient reluctant to leave their home because of a fear of falling?  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     AROM   Overall AROM Comments R knee 8 to 121 degrees      Strength   Overall Strength Comments R knee strength of 4+/5      Transfers   Five time sit to stand comments  15.3 sec with use of UE   unable to complete stand without use of UE                       Objective measurements completed on examination: See above findings.       [facility] Adult PT Treatment/Exercise - 10/29/20 0001        Lumbar Exercises: Aerobic   Nustep L4 x6 min LE only                       PT Short Term Goals - 10/29/20 1306       PT SHORT TERM GOAL #1   Title independent with initial HEP    Time 2    Period Weeks    Status New               PT Long Term Goals - 10/29/20 1306       PT LONG TERM GOAL #1   Title Increase R knee FOTO to 61%.    Baseline 43%    Time 8    Period Weeks    Status New      PT LONG TERM GOAL #2   Title Independent with advanced HEP    Time 8    Period Weeks    Status New      PT LONG TERM GOAL #3   Title Pt will report </= 2/10 knee and low back pain with functional activities and ADLs    Time 8    Period Weeks    Status New      PT LONG TERM GOAL #4   Title Pt will be able to negotiate stairs using safe reciprocal pattern with </= 2/10 pain to facilitate ability to negotiate staairs within the home    Time 8    Period Weeks    Status New                    Plan - 10/29/20 1300     Clinical Impression Statement Pt presents to outpatient PT with a referral from Dr [name] with a new diagnosis of R quadriceps tendonitis and acute R knee pain.  He was recently seen in this practice secondary to his L knee pain and he reports that he was feeling better, but he had a fall last month and injured his R knee.  Prior to his knee injuries, pt was able to play golf and working full time at his furniture business.  Currently, he presents with decreased R knee extension, increased pain, and difficulty walking.  He would benefit from skilled PT to address his functional impairments to allow him to return to his leisure activities, such as golfing.    Stability/Clinical Decision Making Stable/Uncomplicated    Clinical Decision Making Low    Rehab Potential Good    PT Frequency 2x / week    PT Duration 8 weeks    PT Treatment/Interventions ADLs/Self Care Home Management;Cryotherapy;Electrical Stimulation;Iontophoresis 4mg /ml  Dexamethasone;Moist Heat;Gait training;Functional mobility training;Therapeutic activities;Therapeutic exercise;Balance training;Neuromuscular re-education;Manual techniques;Patient/family education;Vasopneumatic Device;Stair training;Passive range of motion;Dry needling;Taping    PT Next Visit Plan Gentle progression of LE strength and flexibility as tolerated. Manual and modalities as needed.    Consulted and Agree with Plan of Care Patient             Patient will benefit from skilled therapeutic intervention in order to improve the following deficits and impairments:  Abnormal gait, Pain, Decreased balance, Impaired flexibility, Decreased strength, Decreased mobility, Decreased range of motion, Difficulty walking  Visit Diagnosis: Acute pain of right knee - Plan: PT plan of care cert/re-cert  Muscle weakness (generalized) - Plan: PT plan of care cert/re-cert  Chronic pain of left knee - Plan: PT plan of care cert/re-cert  Difficulty in walking, not elsewhere classified - Plan: PT plan of care cert/re-cert     Problem List Patient Active Problem List   Diagnosis Date Noted   Right otitis media 06/03/2020   Chronic systolic heart failure (HCC) 04/08/2020   Ventricular tachycardia    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   Palpitations 08/11/2019   Generalized abdominal pain 04/04/2019   Diarrhea 04/04/2019   Trigger finger, right ring finger 01/06/2019   Hyperglycemia 10/17/2018   Patellar subluxation, left, initial encounter 06/13/2018   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/2018   Greater trochanteric bursitis of right hip 04/18/2018   Nonallopathic lesion of sacral region 01/27/2017   Nonallopathic lesion of thoracic region 01/27/2017   Nonallopathic lesion of lumbosacral region 01/27/2017   Degenerative disc disease, lumbar 12/29/2016   External otitis of right ear 06/09/2016   Skin lesion 07/11/2015   Right Achilles tendinitis 07/05/2014   Unspecified  constipation 10/01/2013   Vertigo 08/01/2013   Lateral epicondylitis  of elbow 07/07/2013   Other dysphagia 06/29/2013   Right elbow pain 06/29/2013   Posterior tibial tendinitis of right leg 03/22/2013   Gastroenteritis presumed infectious 02/13/2013   Right knee pain 11/08/2012   Closed fracture of left distal radius 10/25/2012   Renal insufficiency 06/24/2012   Abnormal TSH 06/24/2012   Recurrent epistaxis 01/19/2012   Fatigue 09/07/2011   Back pain 06/02/2011   Preventative health care 06/09/2010   Eustachian tube dysfunction, right 04/06/2009   NECK PAIN 03/27/2009   Allergic rhinitis 01/11/2008   Lumbago 01/11/2008   Chronic sinus bradycardia 10/06/2007   GERD 07/05/2007   Diverticulosis of colon 07/05/2007   Hyperlipidemia 02/02/2007   PERSISTENT DISORDER INITIATING/MAINTAINING SLEEP 02/02/2007   Essential hypertension 02/02/2007   ANEURYSM 02/02/2007    04/02/2007, PT, DPT 10/29/2020, 1:12 PM  [facility] [address] [place], [place], [place] Phone: [phone]   Fax:  [phone]  Name: Thomas Caldwell MRN: Angelena Sole Date of Birth: 1945/12/04

## 2020-11-01 ENCOUNTER — Other Ambulatory Visit: Payer: Self-pay

## 2020-11-01 ENCOUNTER — Ambulatory Visit: Payer: 59 | Admitting: Physical Therapy

## 2020-11-01 DIAGNOSIS — G8929 Other chronic pain: Secondary | ICD-10-CM

## 2020-11-01 DIAGNOSIS — M545 Low back pain, unspecified: Secondary | ICD-10-CM

## 2020-11-01 DIAGNOSIS — R262 Difficulty in walking, not elsewhere classified: Secondary | ICD-10-CM

## 2020-11-01 DIAGNOSIS — M25561 Pain in right knee: Secondary | ICD-10-CM

## 2020-11-01 DIAGNOSIS — M6281 Muscle weakness (generalized): Secondary | ICD-10-CM

## 2020-11-01 DIAGNOSIS — M25562 Pain in left knee: Secondary | ICD-10-CM

## 2020-11-01 DIAGNOSIS — R600 Localized edema: Secondary | ICD-10-CM

## 2020-11-01 NOTE — Patient Instructions (Signed)
Access Code: [email or web address] URL: [email or web address] Date: 11/01/2020 Prepared by: Oley Balm  Exercises Supine Figure 4 Piriformis Stretch - 1 x daily - 7 x weekly - 1 sets - 4 reps - 15 hold

## 2020-11-01 NOTE — Therapy (Signed)
[place] [place], [place], [address] Phone: [phone]   Fax:  [phone]  Physical Therapy Treatment  Patient Details  Name: Thomas Caldwell MRN: [id number] Date of Birth: 1945-07-30 Referring Provider (PT): Dr Lynne Leader   Encounter Date: 11/01/2020   PT End of Session - 11/01/20 0847     Visit Number 2    Date for PT Re-Evaluation 12/20/20    PT Start Time 4492    PT Stop Time 0100    PT Time Calculation (min) 45 min    Activity Tolerance Patient tolerated treatment well    Behavior During Therapy [facility] for tasks assessed/performed             Past Medical History:  Diagnosis Date   ALLERGIC RHINITIS 01/11/2008   ANEURYSM 02/02/2007   brain   BRADYCARDIA, CHRONIC 10/06/2007   DIVERTICULOSIS, COLON 07/05/2007   EUSTACHIAN TUBE DYSFUNCTION 04/06/2009   GERD 07/05/2007   HYPERLIPIDEMIA 02/02/2007   HYPERTENSION 02/02/2007   Lumbago 01/11/2008   Peptic ulcer    PERSISTENT DISORDER INITIATING/MAINTAINING SLEEP 02/02/2007    Past Surgical History:  Procedure Laterality Date   APPENDECTOMY  1982   LEFT HEART CATH AND CORONARY ANGIOGRAPHY N/A 10/04/2019   Procedure: LEFT HEART CATH AND CORONARY ANGIOGRAPHY;  Surgeon: Troy Sine, MD;  Location: [facility];  Service: Cardiovascular;  Laterality: N/A;   LUMBAR DISC SURGERY  12/2005   Dr. Vertell Limber   [facility] Left brain  1992   due to brain aneurysm s/p repair    There were no vitals filed for this visit.   Subjective Assessment - 11/01/20 0801     Subjective Patient reports B knee pain continues in both legs. It bothers him when attempting to climb steps.    Pertinent History x/p Platelet Rich Plasma injection on 10/11/20 from Dr Lynne Leader    Limitations Other (comment)    Currently in Pain? Yes    Pain Score 5     Pain Location Knee    Pain Orientation Left;Right    Pain Descriptors / Indicators Aching    Pain Type Acute pain                                [facility] Adult PT Treatment/Exercise - 11/01/20 0804       High Level Balance   High Level Balance Comments SLS activity, tapping cones placed on the floor, engaging rotaiton in SLS. patient performed with CGA, stable throughout.      Lumbar Exercises: Aerobic   Nustep L5 x6 min LE only      Lumbar Exercises: Seated   Sit to Stand 5 reps    Sit to Stand Limitations Mini squats from elevated surface, emphasis on slow movement.      Lumbar Exercises: Supine   Other Supine Lumbar Exercises ROM, contract/relax hamstring stretch, figure 4, quad stretch in hip extension. 4 x 10 seconds each, BLE.      Knee/Hip Exercises: Machines for Strengthening   Cybex Knee Extension 2 x 20 reps, 10#    Cybex Knee Flexion 2 x 20 reps, 35#    Other Machine TKE on Cybex, 2 x 20, 5#      Knee/Hip Exercises: Standing   Other Standing Knee Exercises 4 way kicks against red T band resistance, 2 x 15 reps each legs  PT Education - 11/01/20 0847     Education Details Updated HEP, quality of movments during exercise.    Person(s) Educated Patient    Methods Explanation;Demonstration;Handout    Comprehension Returned demonstration;Verbalized understanding              PT Short Term Goals - 11/01/20 0851       PT SHORT TERM GOAL #1   Title independent with initial HEP    Baseline Program modified    Status Partially Met    Target Date 08/02/20               PT Long Term Goals - 10/29/20 1306       PT LONG TERM GOAL #1   Title Increase R knee FOTO to 61%.    Baseline 43%    Time 8    Period Weeks    Status New      PT LONG TERM GOAL #2   Title Independent with advanced HEP    Time 8    Period Weeks    Status New      PT LONG TERM GOAL #3   Title Pt will report </= 2/10 knee and low back pain with functional activities and ADLs    Time 8    Period Weeks    Status New      PT LONG TERM GOAL #4   Title Pt  will be able to negotiate stairs using safe reciprocal pattern with </= 2/10 pain to facilitate ability to negotiate staairs within the home    Time 8    Period Weeks    Status New                   Plan - 11/01/20 0848     Clinical Impression Statement Patient reports B knee pain, R> L. He is performing HEP daily. Progressed with strengthening and ROM for BLE.    Stability/Clinical Decision Making Stable/Uncomplicated    Clinical Decision Making Low    Rehab Potential Good    PT Frequency 2x / week    PT Treatment/Interventions ADLs/Self Care Home Management;Cryotherapy;Electrical Stimulation;Iontophoresis 66m/ml Dexamethasone;Moist Heat;Gait training;Functional mobility training;Therapeutic activities;Therapeutic exercise;Balance training;Neuromuscular re-education;Manual techniques;Patient/family education;Vasopneumatic Device;Stair training;Passive range of motion;Dry needling;Taping    PT Next Visit Plan Continue to progress strengthening and stretch. Possible modalities for pain relief-Ionto    Consulted and Agree with Plan of Care Patient             Patient will benefit from skilled therapeutic intervention in order to improve the following deficits and impairments:  Abnormal gait, Pain, Decreased balance, Impaired flexibility, Decreased strength, Decreased mobility, Decreased range of motion, Difficulty walking  Visit Diagnosis: Acute pain of right knee  Low back pain, unspecified back pain laterality, unspecified chronicity, unspecified whether sciatica present  Muscle weakness (generalized)  Localized edema  Chronic pain of left knee  Difficulty in walking, not elsewhere classified  Acute pain of left knee     Problem List Patient Active Problem List   Diagnosis Date Noted   Right otitis media 038/88/2800  Chronic systolic heart failure ([place] 04/08/2020   Ventricular tachycardia    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   Palpitations  08/11/2019   Generalized abdominal pain 04/04/2019   Diarrhea 04/04/2019   Trigger finger, right ring finger 01/06/2019   Hyperglycemia 10/17/2018   Patellar subluxation, left, initial encounter 06/13/2018   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/2018   Greater trochanteric bursitis of right hip  04/18/2018   Nonallopathic lesion of sacral region 01/27/2017   Nonallopathic lesion of thoracic region 01/27/2017   Nonallopathic lesion of lumbosacral region 01/27/2017   Degenerative disc disease, lumbar 12/29/2016   External otitis of right ear 06/09/2016   Skin lesion 07/11/2015   Right Achilles tendinitis 07/05/2014   Unspecified constipation 10/01/2013   Vertigo 08/01/2013   Lateral epicondylitis  of elbow 07/07/2013   Other dysphagia 06/29/2013   Right elbow pain 06/29/2013   Posterior tibial tendinitis of right leg 03/22/2013   Gastroenteritis presumed infectious 02/13/2013   Right knee pain 11/08/2012   Closed fracture of left distal radius 10/25/2012   Renal insufficiency 06/24/2012   Abnormal TSH 06/24/2012   Recurrent epistaxis 01/19/2012   Fatigue 09/07/2011   Back pain 06/02/2011   Preventative health care 06/09/2010   Eustachian tube dysfunction, right 04/06/2009   NECK PAIN 03/27/2009   Allergic rhinitis 01/11/2008   Lumbago 01/11/2008   Chronic sinus bradycardia 10/06/2007   GERD 07/05/2007   Diverticulosis of colon 07/05/2007   Hyperlipidemia 02/02/2007   PERSISTENT DISORDER INITIATING/MAINTAINING SLEEP 02/02/2007   Essential hypertension 02/02/2007   ANEURYSM 02/02/2007    Marcelina Morel, DPT 11/01/2020, 8:55 AM  [place]. [place], [place], [address] Phone: [phone]   Fax:  [phone]  Name: Thomas Caldwell MRN: [id number] Date of Birth: 09-05-1945

## 2020-11-04 ENCOUNTER — Encounter: Payer: Self-pay | Admitting: Internal Medicine

## 2020-11-05 ENCOUNTER — Encounter: Payer: Self-pay | Admitting: Internal Medicine

## 2020-11-05 ENCOUNTER — Ambulatory Visit (INDEPENDENT_AMBULATORY_CARE_PROVIDER_SITE_OTHER): Payer: 59 | Admitting: Internal Medicine

## 2020-11-05 ENCOUNTER — Other Ambulatory Visit: Payer: Self-pay

## 2020-11-05 VITALS — BP 120/64 | HR 64 | Temp 98.2°F | Ht 72.0 in | Wt 195.0 lb

## 2020-11-05 DIAGNOSIS — G479 Sleep disorder, unspecified: Secondary | ICD-10-CM | POA: Diagnosis not present

## 2020-11-05 DIAGNOSIS — K2100 Gastro-esophageal reflux disease with esophagitis, without bleeding: Secondary | ICD-10-CM

## 2020-11-05 DIAGNOSIS — S46819A Strain of other muscles, fascia and tendons at shoulder and upper arm level, unspecified arm, initial encounter: Secondary | ICD-10-CM

## 2020-11-05 MED ORDER — METHOCARBAMOL 500 MG PO TABS: 500.0000 mg | ORAL_TABLET | Freq: Four times a day (QID) | ORAL | 0 refills | Status: DC

## 2020-11-05 MED ORDER — PANTOPRAZOLE SODIUM 40 MG PO TBEC: 40.0000 mg | DELAYED_RELEASE_TABLET | Freq: Two times a day (BID) | ORAL | 3 refills | Status: DC

## 2020-11-05 MED ORDER — TRAZODONE HCL 50 MG PO TABS: 25.0000 mg | ORAL_TABLET | Freq: Every evening | ORAL | 3 refills | Status: DC | PRN

## 2020-11-05 NOTE — Patient Instructions (Signed)
    Medications changes include :     Stop omeprazole.   Start protonix 40mg  twice daily x 1 month then once daily  Start methocarbamol 4 times a day as needed for neck pain  Start trazodone at night for sleep.    Your prescription(s) have been submitted to your pharmacy. Please take as directed and contact our office if you believe you are having problem(s) with the medication(s).

## 2020-11-05 NOTE — Progress Notes (Signed)
Subjective:    Patient ID: Thomas Caldwell, male    DOB: Mar 10, 1945, 75 y.o.   MRN: [id number]  This visit occurred during the SARS-CoV-2 public health emergency.  Safety protocols were in place, including screening questions prior to the visit, additional usage of staff PPE, and extensive cleaning of exam room while observing appropriate contact time as indicated for disinfecting solutions.    HPI The patient is here for an acute visit.   Neck pain: Yesterday morning when he woke up his neck felt a little bit off and then at lunch he had intense muscle spasms mostly on the right side of his neck that radiated down to his shoulder and to a lesser degree on the left side.  He has tried heat and putting the pain patches on the area, but has not helped much.  It is very painful.  GERD: He has a history of GERD, but recently he has been experiencing GERD daily.  He does state hoarseness and a sore throat.  He denies any difficulty swallowing.  He does have some slight upper abdominal discomfort.  He is taking omeprazole 40 mg daily.  Difficulty sleeping: He has been experiencing difficulty sleeping with the knee treatments he is undergoing and everything else.  At 1 point he was on trazodone and wondered if he could try that again.    Medications and allergies reviewed with patient and updated if appropriate.  Patient Active Problem List   Diagnosis Date Noted   Right otitis media 06/03/2020   Chronic systolic heart failure (HCC) 04/08/2020   Ventricular tachycardia    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   Palpitations 08/11/2019   Generalized abdominal pain 04/04/2019   Diarrhea 04/04/2019   Trigger finger, right ring finger 01/06/2019   Hyperglycemia 10/17/2018   Patellar subluxation, left, initial encounter 06/13/2018   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/2018   Greater trochanteric bursitis of right hip 04/18/2018   Nonallopathic lesion of sacral region  01/27/2017   Nonallopathic lesion of thoracic region 01/27/2017   Nonallopathic lesion of lumbosacral region 01/27/2017   Degenerative disc disease, lumbar 12/29/2016   External otitis of right ear 06/09/2016   Skin lesion 07/11/2015   Right Achilles tendinitis 07/05/2014   Unspecified constipation 10/01/2013   Vertigo 08/01/2013   Lateral epicondylitis  of elbow 07/07/2013   Other dysphagia 06/29/2013   Right elbow pain 06/29/2013   Posterior tibial tendinitis of right leg 03/22/2013   Gastroenteritis presumed infectious 02/13/2013   Right knee pain 11/08/2012   Closed fracture of left distal radius 10/25/2012   Renal insufficiency 06/24/2012   Abnormal TSH 06/24/2012   Recurrent epistaxis 01/19/2012   Fatigue 09/07/2011   Back pain 06/02/2011   Preventative health care 06/09/2010   Eustachian tube dysfunction, right 04/06/2009   NECK PAIN 03/27/2009   Allergic rhinitis 01/11/2008   Lumbago 01/11/2008   Chronic sinus bradycardia 10/06/2007   GERD 07/05/2007   Diverticulosis of colon 07/05/2007   Hyperlipidemia 02/02/2007   PERSISTENT DISORDER INITIATING/MAINTAINING SLEEP 02/02/2007   Essential hypertension 02/02/2007   ANEURYSM 02/02/2007    Current Outpatient Medications on File Prior to Visit  Medication Sig Dispense Refill   amLODipine (NORVASC) 10 MG tablet TAKE 1 TABLET(10 MG) BY MOUTH DAILY 90 tablet 2   aspirin 81 MG EC tablet Take 81 mg by mouth daily.     dronedarone (MULTAQ) 400 MG tablet Take 1 tablet (400 mg total) by mouth 2 (two) times daily with a meal. 60  tablet 11   guaiFENesin (MUCINEX) 600 MG 12 hr tablet Take 2 tablets (1,200 mg total) by mouth 2 (two) times daily as needed. 60 tablet 1   metoprolol succinate (TOPROL XL) 25 MG 24 hr tablet Take 1 tablet (25 mg total) by mouth daily. 90 tablet 3   naproxen (NAPROSYN) 500 MG tablet TAKE 1 TABLET(500 MG) BY MOUTH TWICE DAILY WITH A MEAL 60 tablet 2   nitroGLYCERIN (NITRODUR - DOSED IN MG/24 HR) 0.2 mg/hr  patch Apply 1/4 patch daily to tendon for tendonitis. 30 patch 1   omeprazole (PRILOSEC) 20 MG capsule Take 2 capsules (40 mg total) by mouth daily. 180 capsule 3   rosuvastatin (CRESTOR) 20 MG tablet TAKE 1 TABLET(20 MG) BY MOUTH DAILY 90 tablet 2   sacubitril-valsartan (ENTRESTO) 49-51 MG Take 1 tablet by mouth 2 (two) times daily. 60 tablet 11   traMADol (ULTRAM) 50 MG tablet Take 1 tablet (50 mg total) by mouth every 6 (six) hours as needed. 30 tablet 2   Current Facility-Administered Medications on File Prior to Visit  Medication Dose Route Frequency Provider Last Rate Last Admin   sodium chloride flush (NS) 0.9 % injection 3 mL  3 mL Intravenous Q12H Duke Salvia, MD        Past Medical History:  Diagnosis Date   ALLERGIC RHINITIS 01/11/2008   ANEURYSM 02/02/2007   brain   BRADYCARDIA, CHRONIC 10/06/2007   DIVERTICULOSIS, COLON 07/05/2007   EUSTACHIAN TUBE DYSFUNCTION 04/06/2009   GERD 07/05/2007   HYPERLIPIDEMIA 02/02/2007   HYPERTENSION 02/02/2007   Lumbago 01/11/2008   Peptic ulcer    PERSISTENT DISORDER INITIATING/MAINTAINING SLEEP 02/02/2007    Past Surgical History:  Procedure Laterality Date   APPENDECTOMY  1982   LEFT HEART CATH AND CORONARY ANGIOGRAPHY N/A 10/04/2019   Procedure: LEFT HEART CATH AND CORONARY ANGIOGRAPHY;  Surgeon: Lennette Bihari, MD;  Location: [facility];  Service: Cardiovascular;  Laterality: N/A;   LUMBAR DISC SURGERY  12/2005   Dr. Venetia Maxon   [facility] Left brain  1992   due to brain aneurysm s/p repair    Social History   Socioeconomic History   Marital status: Married    Spouse name: Not on file   Number of children: 2   Years of education: Not on file   Highest education level: Not on file  Occupational History   Occupation: furniture    Employer: [employer]  Tobacco Use   Smoking status: Never   Smokeless tobacco: Never  Substance and Sexual Activity   Alcohol use: Yes    Comment: 0-1 per day   Drug use: No   Sexual activity:  Yes    Birth control/protection: None  Other Topics Concern   Not on file  Social History Narrative   Not on file   Social Determinants of Health   Financial Resource Strain: Not on file  Food Insecurity: Not on file  Transportation Needs: Not on file  Physical Activity: Not on file  Stress: Not on file  Social Connections: Not on file    Family History  Problem Relation Age of Onset   Heart disease Sister    Hypertension Father    Heart disease Father    Hypertension Brother        3 bother with HTN   Esophageal cancer Brother    Colon cancer Neg Hx    Rectal cancer Neg Hx    Stomach cancer Neg Hx     Review  of Systems  Constitutional:  Negative for fever.  HENT:  Positive for sore throat and voice change. Negative for trouble swallowing.   Gastrointestinal:  Positive for abdominal pain and nausea.  Neurological:  Negative for dizziness, weakness, light-headedness, numbness and headaches.      Objective:   Vitals:   11/05/20 1541  BP: 120/64  Pulse: 64  Temp: 98.2 F (36.8 C)  SpO2: 95%   BP Readings from Last 3 Encounters:  11/05/20 120/64  10/23/20 138/80  10/04/20 120/66   Wt Readings from Last 3 Encounters:  11/05/20 195 lb (88.5 kg)  10/23/20 198 lb 6.4 oz (90 kg)  10/04/20 198 lb 12.8 oz (90.2 kg)   Body mass index is 26.45 kg/m.   Physical Exam Constitutional:      General: He is not in acute distress.    Appearance: Normal appearance. He is not ill-appearing.  HENT:     Head: Normocephalic and atraumatic.  Abdominal:     General: There is no distension.     Palpations: Abdomen is soft.     Tenderness: There is no abdominal tenderness.  Musculoskeletal:        General: Tenderness (Right trapezius from base of skull down to shoulder > left trapezius.  Increase pain with movement of head and shoulders.) present.     Cervical back: Neck supple. No tenderness.  Skin:    General: Skin is warm and dry.  Neurological:     Mental Status: He  is alert.     Sensory: No sensory deficit.     Motor: No weakness.           Assessment & Plan:    Strain trapezius muscle R >L: Acute Started yesterday Tenderness bilateral trapezius muscles, increased pain with movement Can continue heat, topical muscle/pain medication or pads Start methocarbamol 500 mg 4 times daily as needed-if is not effective he will let me know so we can try a different muscle relaxer  Sleep difficulties: New In the past he was on trazodone and did well with this and was wondering if he could retry that Start trazodone 50 mg nightly-hopefully this will be temporary when some of his medical issues resolve  GERD: Acute on chronic Has GERD, but has not been controlled recently Discontinue omeprazole 40 mg daily Start pantoprazole 40 mg twice daily for 1 month and then take once daily Follow-up with his PCP next month and can review the above     Call sooner with any concerns

## 2020-11-06 NOTE — Telephone Encounter (Signed)
I see where pt was refilled robaxin yesterday, and is on protonix  Not sure if he needs anything further

## 2020-11-12 ENCOUNTER — Ambulatory Visit: Payer: 59 | Admitting: Internal Medicine

## 2020-11-21 ENCOUNTER — Ambulatory Visit: Payer: 59 | Admitting: Physical Therapy

## 2020-11-22 ENCOUNTER — Other Ambulatory Visit: Payer: Self-pay

## 2020-11-22 ENCOUNTER — Ambulatory Visit: Payer: 59 | Admitting: Physical Therapy

## 2020-11-22 ENCOUNTER — Encounter: Payer: Self-pay | Admitting: Physical Therapy

## 2020-11-22 DIAGNOSIS — M545 Low back pain, unspecified: Secondary | ICD-10-CM

## 2020-11-22 DIAGNOSIS — M25561 Pain in right knee: Secondary | ICD-10-CM

## 2020-11-22 DIAGNOSIS — R600 Localized edema: Secondary | ICD-10-CM

## 2020-11-22 DIAGNOSIS — M6281 Muscle weakness (generalized): Secondary | ICD-10-CM

## 2020-11-22 NOTE — Therapy (Signed)
[address]. [place], [place], [address] Phone: [phone]   Fax:  [phone]  Physical Therapy Treatment  Patient Details  Name: Thomas Caldwell MRN: [id number] Date of Birth: 1945-11-29 Referring Provider (PT): Dr Lynne Leader   Encounter Date: 11/22/2020   PT End of Session - 11/22/20 0837     Visit Number 3    Date for PT Re-Evaluation 12/20/20    PT Start Time 0758    PT Stop Time 0840    PT Time Calculation (min) 42 min    Activity Tolerance Patient tolerated treatment well    Behavior During Therapy [facility] for tasks assessed/performed             Past Medical History:  Diagnosis Date   ALLERGIC RHINITIS 01/11/2008   ANEURYSM 02/02/2007   brain   BRADYCARDIA, CHRONIC 10/06/2007   DIVERTICULOSIS, COLON 07/05/2007   EUSTACHIAN TUBE DYSFUNCTION 04/06/2009   GERD 07/05/2007   HYPERLIPIDEMIA 02/02/2007   HYPERTENSION 02/02/2007   Lumbago 01/11/2008   Peptic ulcer    PERSISTENT DISORDER INITIATING/MAINTAINING SLEEP 02/02/2007    Past Surgical History:  Procedure Laterality Date   APPENDECTOMY  1982   LEFT HEART CATH AND CORONARY ANGIOGRAPHY N/A 10/04/2019   Procedure: LEFT HEART CATH AND CORONARY ANGIOGRAPHY;  Surgeon: Troy Sine, MD;  Location: [facility];  Service: Cardiovascular;  Laterality: N/A;   LUMBAR DISC SURGERY  12/2005   Dr. Vertell Limber   [facility] Left brain  1992   due to brain aneurysm s/p repair    There were no vitals filed for this visit.   Subjective Assessment - 11/22/20 0758     Subjective been working at [employer]. Chairs and stairs are most difficult.    Currently in Pain? Yes    Pain Score 3     Pain Location Knee    Pain Orientation Left;Right                               [facility] Adult PT Treatment/Exercise - 11/22/20 0001       Exercises   Exercises Knee/Hip      Lumbar Exercises: Aerobic   Nustep L4 x6 min LE only      Lumbar  Exercises: Seated   Sit to Stand 10 reps   x2 elevated mat table     Knee/Hip Exercises: Machines for Strengthening   Cybex Knee Extension 2 x 15 reps, 10#    Cybex Knee Flexion 2 x 15 reps, 35#    Cybex Leg Press 40lb 2x10      Knee/Hip Exercises: Standing   Heel Raises Both;2 sets;10 reps;2 seconds    Lateral Step Up Both;1 set;10 reps;Hand Hold: 0;Step Height: 4"    Forward Step Up Both;3 sets;5 sets;10 reps;Step Height: 4";Step Height: 6"   two sets of 5 with 6''   Walking with Sports Cord 30lb side steps x5, backwards 50lb x10      Knee/Hip Exercises: Supine   Straight Leg Raises Strengthening;Right;2 sets;10 reps    Straight Leg Raises Limitations 3    Straight Leg Raise with External Rotation Right;2 sets;10 reps                       PT Short Term Goals - 11/01/20 8921       PT SHORT TERM GOAL #1   Title independent with initial HEP  Baseline Program modified    Status Partially Met    Target Date 08/02/20               PT Long Term Goals - 10/29/20 1306       PT LONG TERM GOAL #1   Title Increase R knee FOTO to 61%.    Baseline 43%    Time 8    Period Weeks    Status New      PT LONG TERM GOAL #2   Title Independent with advanced HEP    Time 8    Period Weeks    Status New      PT LONG TERM GOAL #3   Title Pt will report </= 2/10 knee and low back pain with functional activities and ADLs    Time 8    Period Weeks    Status New      PT LONG TERM GOAL #4   Title Pt will be able to negotiate stairs using safe reciprocal pattern with </= 2/10 pain to facilitate ability to negotiate staairs within the home    Time [place] - 11/22/20 2229     Clinical Impression Statement Pt did well with a progression of TE. Knee pain reported with step ups, sit to stands, and knee extensions. Good stability with resisted side step. Bilateral LE weakness noted with backwards walking. Cues  for full ROM needed while on the leg press.    Stability/Clinical Decision Making Stable/Uncomplicated    Rehab Potential Good    PT Frequency 2x / week    PT Treatment/Interventions ADLs/Self Care Home Management;Cryotherapy;Electrical Stimulation;Iontophoresis 4mg /ml Dexamethasone;Moist Heat;Gait training;Functional mobility training;Therapeutic activities;Therapeutic exercise;Balance training;Neuromuscular re-education;Manual techniques;Patient/family education;Vasopneumatic Device;Stair training;Passive range of motion;Dry needling;Taping    PT Next Visit Plan Continue to progress strengthening and stretch. Possible modalities for pain relief-Ionto             Patient will benefit from skilled therapeutic intervention in order to improve the following deficits and impairments:  Abnormal gait, Pain, Decreased balance, Impaired flexibility, Decreased strength, Decreased mobility, Decreased range of motion, Difficulty walking  Visit Diagnosis: Low back pain, unspecified back pain laterality, unspecified chronicity, unspecified whether sciatica present  Muscle weakness (generalized)  Localized edema  Acute pain of right knee     Problem List Patient Active Problem List   Diagnosis Date Noted   Right otitis media 79/89/2119   Chronic systolic heart failure ([place]) 04/08/2020   Ventricular tachycardia    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   Palpitations 08/11/2019   Generalized abdominal pain 04/04/2019   Diarrhea 04/04/2019   Trigger finger, right ring finger 01/06/2019   Hyperglycemia 10/17/2018   Patellar subluxation, left, initial encounter 06/13/2018   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/2018   Greater trochanteric bursitis of right hip 04/18/2018   Nonallopathic lesion of sacral region 01/27/2017   Nonallopathic lesion of thoracic region 01/27/2017   Nonallopathic lesion of lumbosacral region 01/27/2017   Degenerative disc disease, lumbar 12/29/2016    External otitis of right ear 06/09/2016   Skin lesion 07/11/2015   Right Achilles tendinitis 07/05/2014   Unspecified constipation 10/01/2013   Vertigo 08/01/2013   Lateral epicondylitis  of elbow 07/07/2013   Other dysphagia 06/29/2013   Right elbow pain 06/29/2013   Posterior tibial tendinitis of right leg 03/22/2013  Gastroenteritis presumed infectious 02/13/2013   Right knee pain 11/08/2012   Closed fracture of left distal radius 10/25/2012   Renal insufficiency 06/24/2012   Abnormal TSH 06/24/2012   Recurrent epistaxis 01/19/2012   Fatigue 09/07/2011   Back pain 06/02/2011   Preventative health care 06/09/2010   Eustachian tube dysfunction, right 04/06/2009   NECK PAIN 03/27/2009   Allergic rhinitis 01/11/2008   Lumbago 01/11/2008   Chronic sinus bradycardia 10/06/2007   GERD 07/05/2007   Diverticulosis of colon 07/05/2007   Hyperlipidemia 02/02/2007   PERSISTENT DISORDER INITIATING/MAINTAINING SLEEP 02/02/2007   Essential hypertension 02/02/2007   ANEURYSM 02/02/2007    Scot Jun, PTA 11/22/2020, 8:40 AM  [address]. [place], [place], [address] Phone: [phone]   Fax:  [phone]  Name: Thomas Caldwell MRN: [id number] Date of Birth: December 06, 1945

## 2020-11-26 ENCOUNTER — Encounter: Payer: Self-pay | Admitting: Physical Therapy

## 2020-11-26 ENCOUNTER — Other Ambulatory Visit (INDEPENDENT_AMBULATORY_CARE_PROVIDER_SITE_OTHER): Payer: 59

## 2020-11-26 ENCOUNTER — Ambulatory Visit: Payer: 59 | Attending: Family Medicine | Admitting: Physical Therapy

## 2020-11-26 ENCOUNTER — Other Ambulatory Visit: Payer: Self-pay

## 2020-11-26 DIAGNOSIS — R600 Localized edema: Secondary | ICD-10-CM | POA: Diagnosis present

## 2020-11-26 DIAGNOSIS — M25561 Pain in right knee: Secondary | ICD-10-CM | POA: Diagnosis present

## 2020-11-26 DIAGNOSIS — R739 Hyperglycemia, unspecified: Secondary | ICD-10-CM | POA: Diagnosis not present

## 2020-11-26 DIAGNOSIS — Z0000 Encounter for general adult medical examination without abnormal findings: Secondary | ICD-10-CM

## 2020-11-26 DIAGNOSIS — E559 Vitamin D deficiency, unspecified: Secondary | ICD-10-CM

## 2020-11-26 DIAGNOSIS — G8929 Other chronic pain: Secondary | ICD-10-CM | POA: Diagnosis present

## 2020-11-26 DIAGNOSIS — R262 Difficulty in walking, not elsewhere classified: Secondary | ICD-10-CM | POA: Insufficient documentation

## 2020-11-26 DIAGNOSIS — E538 Deficiency of other specified B group vitamins: Secondary | ICD-10-CM

## 2020-11-26 DIAGNOSIS — M6281 Muscle weakness (generalized): Secondary | ICD-10-CM | POA: Insufficient documentation

## 2020-11-26 DIAGNOSIS — L989 Disorder of the skin and subcutaneous tissue, unspecified: Secondary | ICD-10-CM

## 2020-11-26 DIAGNOSIS — M25562 Pain in left knee: Secondary | ICD-10-CM | POA: Diagnosis present

## 2020-11-26 LAB — CBC WITH DIFFERENTIAL/PLATELET
Basophils Absolute: 0.1 10*3/uL (ref 0.0–0.1)
Basophils Relative: 1 % (ref 0.0–3.0)
Eosinophils Absolute: 0.1 10*3/uL (ref 0.0–0.7)
Eosinophils Relative: 2.1 % (ref 0.0–5.0)
HCT: 44 % (ref 39.0–52.0)
Hemoglobin: 14.4 g/dL (ref 13.0–17.0)
Lymphocytes Relative: 25.5 % (ref 12.0–46.0)
Lymphs Abs: 1.6 10*3/uL (ref 0.7–4.0)
MCHC: 32.8 g/dL (ref 30.0–36.0)
MCV: 87.4 fl (ref 78.0–100.0)
Monocytes Absolute: 0.8 10*3/uL (ref 0.1–1.0)
Monocytes Relative: 12.4 % — ABNORMAL HIGH (ref 3.0–12.0)
Neutro Abs: 3.7 10*3/uL (ref 1.4–7.7)
Neutrophils Relative %: 59 % (ref 43.0–77.0)
Platelets: 278 10*3/uL (ref 150.0–400.0)
RBC: 5.03 Mil/uL (ref 4.22–5.81)
RDW: 13.9 % (ref 11.5–15.5)
WBC: 6.3 10*3/uL (ref 4.0–10.5)

## 2020-11-26 LAB — URINALYSIS, ROUTINE W REFLEX MICROSCOPIC
Bilirubin Urine: NEGATIVE
Hgb urine dipstick: NEGATIVE
Ketones, ur: NEGATIVE
Leukocytes,Ua: NEGATIVE
Nitrite: NEGATIVE
RBC / HPF: NONE SEEN (ref 0–?)
Specific Gravity, Urine: <=1.005 — AB (ref 1.000–1.030)
Total Protein, Urine: NEGATIVE
Urine Glucose: NEGATIVE
Urobilinogen, UA: 0.2 (ref 0.0–1.0)
WBC, UA: NONE SEEN (ref 0–?)
pH: 6 (ref 5.0–8.0)

## 2020-11-26 LAB — HEPATIC FUNCTION PANEL
ALT: 33 U/L (ref 0–53)
AST: 32 U/L (ref 0–37)
Albumin: 4.3 g/dL (ref 3.5–5.2)
Alkaline Phosphatase: 71 U/L (ref 39–117)
Bilirubin, Direct: 0.1 mg/dL (ref 0.0–0.3)
Total Bilirubin: 0.5 mg/dL (ref 0.2–1.2)
Total Protein: 6.9 g/dL (ref 6.0–8.3)

## 2020-11-26 LAB — PSA: PSA: 2.78 ng/mL (ref 0.10–4.00)

## 2020-11-26 LAB — BASIC METABOLIC PANEL
BUN: 16 mg/dL (ref 6–23)
CO2: 26 mEq/L (ref 19–32)
Calcium: 9.7 mg/dL (ref 8.4–10.5)
Chloride: 104 mEq/L (ref 96–112)
Creatinine, Ser: 1.28 mg/dL (ref 0.40–1.50)
GFR: 55.06 mL/min — ABNORMAL LOW (ref >60.00)
Glucose, Bld: 105 mg/dL — ABNORMAL HIGH (ref 70–99)
Potassium: 5 mEq/L (ref 3.5–5.1)
Sodium: 139 mEq/L (ref 135–145)

## 2020-11-26 LAB — LIPID PANEL
Cholesterol: 150 mg/dL (ref 0–200)
HDL: 46.6 mg/dL (ref >39.00)
LDL Cholesterol: 74 mg/dL (ref 0–99)
NonHDL: 103.26
Total CHOL/HDL Ratio: 3
Triglycerides: 146 mg/dL (ref 0.0–149.0)
VLDL: 29.2 mg/dL (ref 0.0–40.0)

## 2020-11-26 LAB — HEMOGLOBIN A1C: Hgb A1c MFr Bld: 5.6 % (ref 4.6–6.5)

## 2020-11-26 LAB — VITAMIN D 25 HYDROXY (VIT D DEFICIENCY, FRACTURES): VITD: 24.42 ng/mL — ABNORMAL LOW (ref 30.00–100.00)

## 2020-11-26 LAB — TSH: TSH: 4.15 u[IU]/mL (ref 0.35–5.50)

## 2020-11-26 LAB — SEDIMENTATION RATE: Sed Rate: 7 mm/hr (ref 0–20)

## 2020-11-26 LAB — C-REACTIVE PROTEIN: CRP: <1 mg/dL (ref 0.5–20.0)

## 2020-11-26 LAB — VITAMIN B12: Vitamin B-12: 379 pg/mL (ref 211–911)

## 2020-11-26 NOTE — Therapy (Signed)
[address]. [place], [place], [address] Phone: [phone]   Fax:  [phone]  Physical Therapy Treatment  Patient Details  Name: Thomas Caldwell MRN: [id number] Date of Birth: 04-May-1945 Referring Provider (PT): Dr Lynne Leader   Encounter Date: 11/26/2020   PT End of Session - 11/26/20 0847     Visit Number 4    Number of Visits 20    Date for PT Re-Evaluation 12/20/20    Authorization Type [employer]    PT Start Time 3893    PT Stop Time 0838    PT Time Calculation (min) 41 min    Activity Tolerance Patient tolerated treatment well    Behavior During Therapy [facility] for tasks assessed/performed             Past Medical History:  Diagnosis Date   ALLERGIC RHINITIS 01/11/2008   ANEURYSM 02/02/2007   brain   BRADYCARDIA, CHRONIC 10/06/2007   DIVERTICULOSIS, COLON 07/05/2007   EUSTACHIAN TUBE DYSFUNCTION 04/06/2009   GERD 07/05/2007   HYPERLIPIDEMIA 02/02/2007   HYPERTENSION 02/02/2007   Lumbago 01/11/2008   Peptic ulcer    PERSISTENT DISORDER INITIATING/MAINTAINING SLEEP 02/02/2007    Past Surgical History:  Procedure Laterality Date   APPENDECTOMY  1982   LEFT HEART CATH AND CORONARY ANGIOGRAPHY N/A 10/04/2019   Procedure: LEFT HEART CATH AND CORONARY ANGIOGRAPHY;  Surgeon: Troy Sine, MD;  Location: [facility];  Service: Cardiovascular;  Laterality: N/A;   LUMBAR DISC SURGERY  12/2005   Dr. Vertell Limber   [facility] Left brain  1992   due to brain aneurysm s/p repair    There were no vitals filed for this visit.   Subjective Assessment - 11/26/20 0800     Subjective Doing pretty good, getting up from sitting and stairs still really hurt    Currently in Pain? Yes    Pain Score 1     Pain Location Knee    Pain Orientation Right                               [facility] Adult PT Treatment/Exercise - 11/26/20 0001       Knee/Hip Exercises: Stretches   Passive Hamstring Stretch Both;4 reps;20  seconds      Knee/Hip Exercises: Aerobic   Recumbent Bike level 3.5 x 6 minutes      Knee/Hip Exercises: Machines for Strengthening   Cybex Knee Extension 5# 3x10 really focus on the TKE, with 10 # had pain    Cybex Knee Flexion 2 x 15 reps, 35#    Cybex Leg Press 40lb 2x10, working different angles      Knee/Hip Exercises: Standing   Terminal Knee Extension Both;20 reps    Terminal Knee Extension Limitations ball behind knee    Lateral Step Up Step Height: 2";20 reps;Both    Walking with Sports Cord resisted all directions      Knee/Hip Exercises: Supine   Other Supine Knee/Hip Exercises feet on ball K2C, bridge and isometric abs      Knee/Hip Exercises: Sidelying   Clams 5# 20 each      Iontophoresis   Type of Iontophoresis Dexamethasone    Location r knee    Dose 75m dose    Time 4 hour patch                       PT Short Term  Goals - 11/01/20 0851       PT SHORT TERM GOAL #1   Title independent with initial HEP    Baseline Program modified    Status Partially Met    Target Date 08/02/20               PT Long Term Goals - 11/26/20 0851       PT LONG TERM GOAL #1   Title Increase R knee FOTO to 61%.    Status On-going      PT LONG TERM GOAL #2   Title Independent with advanced HEP    Status Partially Met      PT LONG TERM GOAL #3   Title Pt will report </= 2/10 knee and low back pain with functional activities and ADLs    Status Partially Met      PT LONG TERM GOAL #4   Title Pt will be able to negotiate stairs using safe reciprocal pattern with </= 2/10 pain to facilitate ability to negotiate staairs within the home    Status On-going                   Plan - 11/26/20 0848     Clinical Impression Statement Doing well, the biggest issue is stairs and getting up from sitting, he has very pinpoint type pain superior lateral patella.  I used the ionto again, 2" lateral step ups not allowing toe touch or compensation does  still cause pain.  Added hip strength and HS stretches    PT Next Visit Plan Continue to progress strengthening and stretch. Possible modalities for pain relief-Ionto    Consulted and Agree with Plan of Care Patient             Patient will benefit from skilled therapeutic intervention in order to improve the following deficits and impairments:  Abnormal gait, Pain, Decreased balance, Impaired flexibility, Decreased strength, Decreased mobility, Decreased range of motion, Difficulty walking  Visit Diagnosis: Muscle weakness (generalized)  Localized edema  Acute pain of right knee  Chronic pain of left knee  Difficulty in walking, not elsewhere classified  Acute pain of left knee     Problem List Patient Active Problem List   Diagnosis Date Noted   Right otitis media 06/03/2020   Chronic systolic heart failure (HCC) 04/08/2020   Ventricular tachycardia    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   Palpitations 08/11/2019   Generalized abdominal pain 04/04/2019   Diarrhea 04/04/2019   Trigger finger, right ring finger 01/06/2019   Hyperglycemia 10/17/2018   Patellar subluxation, left, initial encounter 06/13/2018   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/2018   Greater trochanteric bursitis of right hip 04/18/2018   Nonallopathic lesion of sacral region 01/27/2017   Nonallopathic lesion of thoracic region 01/27/2017   Nonallopathic lesion of lumbosacral region 01/27/2017   Degenerative disc disease, lumbar 12/29/2016   External otitis of right ear 06/09/2016   Skin lesion 07/11/2015   Right Achilles tendinitis 07/05/2014   Unspecified constipation 10/01/2013   Vertigo 08/01/2013   Lateral epicondylitis  of elbow 07/07/2013   Other dysphagia 06/29/2013   Right elbow pain 06/29/2013   Posterior tibial tendinitis of right leg 03/22/2013   Gastroenteritis presumed infectious 02/13/2013   Right knee pain 11/08/2012   Closed fracture of left distal radius  10/25/2012   Renal insufficiency 06/24/2012   Abnormal TSH 06/24/2012   Recurrent epistaxis 01/19/2012   Fatigue 09/07/2011   Back pain 06/02/2011   Preventative   health care 06/09/2010   Eustachian tube dysfunction, right 04/06/2009   NECK PAIN 03/27/2009   Allergic rhinitis 01/11/2008   Lumbago 01/11/2008   Chronic sinus bradycardia 10/06/2007   GERD 07/05/2007   Diverticulosis of colon 07/05/2007   Hyperlipidemia 02/02/2007   PERSISTENT DISORDER INITIATING/MAINTAINING SLEEP 02/02/2007   Essential hypertension 02/02/2007   ANEURYSM 02/02/2007    ALBRIGHT,MICHAEL W, PT 11/26/2020, 8:52 AM  [facility] [address] [place], [place], [address] Phone: [phone]   Fax:  [phone]  Name: Kenith Mosqueda MRN: [id number] Date of Birth: 01/02/1946    

## 2020-11-28 ENCOUNTER — Ambulatory Visit: Payer: 59 | Admitting: Physical Therapy

## 2020-12-03 ENCOUNTER — Other Ambulatory Visit: Payer: Self-pay

## 2020-12-03 ENCOUNTER — Encounter: Payer: Self-pay | Admitting: Internal Medicine

## 2020-12-03 ENCOUNTER — Ambulatory Visit (INDEPENDENT_AMBULATORY_CARE_PROVIDER_SITE_OTHER): Payer: 59 | Admitting: Internal Medicine

## 2020-12-03 VITALS — BP 116/64 | HR 60 | Temp 98.5°F | Ht 72.0 in | Wt 194.0 lb

## 2020-12-03 DIAGNOSIS — E559 Vitamin D deficiency, unspecified: Secondary | ICD-10-CM

## 2020-12-03 DIAGNOSIS — Z23 Encounter for immunization: Secondary | ICD-10-CM

## 2020-12-03 DIAGNOSIS — E538 Deficiency of other specified B group vitamins: Secondary | ICD-10-CM | POA: Diagnosis not present

## 2020-12-03 DIAGNOSIS — R739 Hyperglycemia, unspecified: Secondary | ICD-10-CM

## 2020-12-03 DIAGNOSIS — Z0001 Encounter for general adult medical examination with abnormal findings: Secondary | ICD-10-CM | POA: Diagnosis not present

## 2020-12-03 DIAGNOSIS — R972 Elevated prostate specific antigen [PSA]: Secondary | ICD-10-CM | POA: Diagnosis not present

## 2020-12-03 DIAGNOSIS — N401 Enlarged prostate with lower urinary tract symptoms: Secondary | ICD-10-CM | POA: Diagnosis not present

## 2020-12-03 DIAGNOSIS — R351 Nocturia: Secondary | ICD-10-CM | POA: Diagnosis not present

## 2020-12-03 DIAGNOSIS — E7800 Pure hypercholesterolemia, unspecified: Secondary | ICD-10-CM

## 2020-12-03 DIAGNOSIS — I10 Essential (primary) hypertension: Secondary | ICD-10-CM

## 2020-12-03 NOTE — Progress Notes (Signed)
Patient ID: Thomas Caldwell, male   DOB: 02-01-1945, 75 y.o.   MRN: [id number]         Chief Complaint:: wellness exam and elevated psa, low vit d, hyperglycemia       HPI:  Thomas Caldwell is a 75 y.o. male here for wellness exam; declines covid booster, o/w up to date                        Also has bilateral knees improved with PRP tx recently but still some chronic soreness, getting PT, and still having to push off with standing which is new for him, and not playing golf lately but plans to try soon if knees continue to improve.  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  Not taking vit d.  Pt denies chest pain, increased sob or doe, wheezing, orthopnea, PND, increased LE swelling, palpitations, dizziness or syncope.   Pt denies polydipsia, polyuria, or new focal neuro s/s.  Denies urinary symptoms such as dysuria, frequency, urgency, flank pain, hematuria or n/v, fever, chills.  Also has increased nocturia up to 3 times per night or more  . Pt denies fever, wt loss, night sweats, loss of appetite, or other constitutional symptoms  No other new complaints Wt Readings from Last 3 Encounters:  12/03/20 194 lb (88 kg)  11/05/20 195 lb (88.5 kg)  10/23/20 198 lb 6.4 oz (90 kg)   BP Readings from Last 3 Encounters:  12/03/20 116/64  11/05/20 120/64  10/23/20 138/80   Immunization History  Administered Date(s) Administered   Fluad Quad(high Dose 65+) 10/17/2018, 11/29/2019   Influenza Whole 11/28/2008   Influenza, High Dose Seasonal PF 04/18/2015   Influenza,inj,Quad PF,6+ Mos 01/02/2013, 09/28/2013   Influenza-Unspecified 10/24/2020   Moderna SARS-COV2 Booster Vaccination 11/27/2019, 02/02/2020   Moderna Sars-Covid-2 Vaccination 02/17/2019, 03/15/2019   Pneumococcal Conjugate-13 06/29/2013   Pneumococcal Polysaccharide-23 05/01/2011   Td 07/09/2008   Tdap 10/17/2018   Zoster  Recombinat (Shingrix) 12/03/2020   Zoster, Live 07/05/2007   There are no preventive care reminders to display for this patient.     Past Medical History:  Diagnosis Date   ALLERGIC RHINITIS 01/11/2008   ANEURYSM 02/02/2007   brain   BRADYCARDIA, CHRONIC 10/06/2007   DIVERTICULOSIS, COLON 07/05/2007   EUSTACHIAN TUBE DYSFUNCTION 04/06/2009   GERD 07/05/2007   HYPERLIPIDEMIA 02/02/2007   HYPERTENSION 02/02/2007   Lumbago 01/11/2008   Peptic ulcer    PERSISTENT DISORDER INITIATING/MAINTAINING SLEEP 02/02/2007   Past Surgical History:  Procedure Laterality Date   APPENDECTOMY  1982   LEFT HEART CATH AND CORONARY ANGIOGRAPHY N/A 10/04/2019   Procedure: LEFT HEART CATH AND CORONARY ANGIOGRAPHY;  Surgeon: Troy Sine, MD;  Location: [facility];  Service: Cardiovascular;  Laterality: N/A;   LUMBAR DISC SURGERY  12/2005   Dr. Vertell Limber   [facility] Left brain  1992   due to brain aneurysm s/p repair    reports that he has never smoked. He has never used smokeless tobacco. He reports current alcohol use. He reports that he does not use drugs. family history includes Esophageal cancer in his brother; Heart disease in his father and sister; Hypertension in his brother and father. Allergies  Allergen Reactions   Codeine Other (See Comments)    Confusion   Current Outpatient Medications on File Prior to Visit  Medication Sig Dispense Refill  amLODipine (NORVASC) 10 MG tablet TAKE 1 TABLET(10 MG) BY MOUTH DAILY 90 tablet 2   dronedarone (MULTAQ) 400 MG tablet Take 1 tablet (400 mg total) by mouth 2 (two) times daily with a meal. 60 tablet 11   guaiFENesin (MUCINEX) 600 MG 12 hr tablet Take 2 tablets (1,200 mg total) by mouth 2 (two) times daily as needed. 60 tablet 1   metoprolol succinate (TOPROL XL) 25 MG 24 hr tablet Take 1 tablet (25 mg total) by mouth daily. 90 tablet 3   naproxen (NAPROSYN) 500 MG tablet TAKE 1 TABLET(500 MG) BY MOUTH TWICE DAILY WITH A MEAL 60 tablet 2   nitroGLYCERIN  (NITRODUR - DOSED IN MG/24 HR) 0.2 mg/hr patch Apply 1/4 patch daily to tendon for tendonitis. 30 patch 1   pantoprazole (PROTONIX) 40 MG tablet Take 1 tablet (40 mg total) by mouth 2 (two) times daily before a meal. After one month take 1 tablet daily before a meal 60 tablet 3   rosuvastatin (CRESTOR) 20 MG tablet TAKE 1 TABLET(20 MG) BY MOUTH DAILY 90 tablet 2   sacubitril-valsartan (ENTRESTO) 49-51 MG Take 1 tablet by mouth 2 (two) times daily. 60 tablet 11   traZODone (DESYREL) 50 MG tablet Take 0.5-1 tablets (25-50 mg total) by mouth at bedtime as needed for sleep. 30 tablet 3   aspirin 81 MG EC tablet Take 81 mg by mouth daily.     Current Facility-Administered Medications on File Prior to Visit  Medication Dose Route Frequency Provider Last Rate Last Admin   sodium chloride flush (NS) 0.9 % injection 3 mL  3 mL Intravenous Q12H Deboraha Sprang, MD            ROS:  All others reviewed and negative.  Objective        PE:  BP 116/64 (BP Location: Right Arm, Patient Position: Sitting, Cuff Size: Large)   Pulse 60   Temp 98.5 F (36.9 C) (Oral)   Ht 6' (1.829 m)   Wt 194 lb (88 kg)   SpO2 97%   BMI 26.31 kg/m                 Constitutional: Pt appears in NAD               HENT: Head: NCAT.                Right Ear: External ear normal.                 Left Ear: External ear normal.                Eyes: . Pupils are equal, round, and reactive to light. Conjunctivae and EOM are normal               Nose: without d/c or deformity               Neck: Neck supple. Gross normal ROM               Cardiovascular: Normal rate and regular rhythm.                 Pulmonary/Chest: Effort normal and breath sounds without rales or wheezing.                Abd:  Soft, NT, ND, + BS, no organomegaly               Neurological: Pt is alert. At baseline orientation, motor grossly intact  Skin: Skin is warm. No rashes, no other new lesions, LE edema - none               Psychiatric:  Pt behavior is normal without agitation   Micro: none  Cardiac tracings I have personally interpreted today:  none  Pertinent Radiological findings (summarize): none   Lab Results  Component Value Date   WBC 6.3 11/26/2020   HGB 14.4 11/26/2020   HCT 44.0 11/26/2020   PLT 278.0 11/26/2020   GLUCOSE 105 (H) 11/26/2020   CHOL 150 11/26/2020   TRIG 146.0 11/26/2020   HDL 46.60 11/26/2020   LDLCALC 74 11/26/2020   ALT 33 11/26/2020   AST 32 11/26/2020   NA 139 11/26/2020   K 5.0 11/26/2020   CL 104 11/26/2020   CREATININE 1.28 11/26/2020   BUN 16 11/26/2020   CO2 26 11/26/2020   TSH 4.15 11/26/2020   PSA 2.78 11/26/2020   HGBA1C 5.6 11/26/2020   Assessment/Plan:  Thomas Caldwell is a 75 y.o. White or Caucasian [1] male with  has a past medical history of ALLERGIC RHINITIS (01/11/2008), ANEURYSM (02/02/2007), BRADYCARDIA, CHRONIC (10/06/2007), DIVERTICULOSIS, COLON (07/05/2007), EUSTACHIAN TUBE DYSFUNCTION (04/06/2009), GERD (07/05/2007), HYPERLIPIDEMIA (02/02/2007), HYPERTENSION (02/02/2007), Lumbago (01/11/2008), Peptic ulcer, and PERSISTENT DISORDER INITIATING/MAINTAINING SLEEP (02/02/2007).  Encounter for well adult exam with abnormal findings Age and sex appropriate education and counseling updated with regular exercise and diet Referrals for preventative services - none needed Immunizations addressed - declines covid booster Smoking counseling  - none needed Evidence for depression or other mood disorder - none significant Most recent labs reviewed. I have personally reviewed and have noted: 1) the patient's medical and social history 2) The patient's current medications and supplements 3) The patient's height, weight, and BMI have been recorded in the chart   Hyperlipidemia Lab Results  Component Value Date   LDLCALC 74 11/26/2020   Mild uncontrolled, goal ldl < 70, pt to continue current statin crestor 20 as declines change   Essential hypertension BP Readings from Last  3 Encounters:  12/03/20 116/64  11/05/20 120/64  10/23/20 138/80   Stable, pt to continue medical treatment toprol, norvasc, entresto   Hyperglycemia Lab Results  Component Value Date   HGBA1C 5.6 11/26/2020   Stable, pt to continue current medical treatment  - diet   Vitamin D deficiency Last vitamin D Lab Results  Component Value Date   VD25OH 24.42 (L) 11/26/2020   Low, to start oral replacement   BPH associated with nocturia Mild recent worsening, for trial flomax qhs  Increased prostate specific antigen (PSA) velocity Asympt, for f/u psa at 6 month  . Lab Results  Component Value Date   PSA 2.78 11/26/2020   PSA 1.12 11/01/2019   PSA 1.73 10/17/2018     Followup: Return in about 1 year (around 12/03/2021).  Oliver Barre, MD 12/08/2020 8:47 PM [facility]

## 2020-12-03 NOTE — Patient Instructions (Addendum)
You had the Shingrix shingles today  Please return for NURSE Visit in 2-6 months shot #2  Please take OTC Vitamin D3 at 2000 units per day, indefinitely  .Please take all new medication as prescribed - the flomax - but if no help after one week, please call for a trial of vesicare for the bladder  Please continue all other medications as before, and refills have been done if requested.  Please have the pharmacy call with any other refills you may need.  Please continue your efforts at being more active, low cholesterol diet, and weight control.  You are otherwise up to date with prevention measures today.  Please keep your appointments with your specialists as you may have planned  Please plan to go to the [facility] in 6 months to recheck the PSA, kidney function and sugar  Please make an Appointment to return for your 1 year visit, or sooner if needed, with Lab testing by Appointment as well, to be done about 3-5 days before at the [facility] Lab (so this is for TWO appointments - please see the scheduling desk as you leave)   Due to the ongoing Covid 19 pandemic, our lab now requires an appointment for any labs done at our office.  If you need labs done and do not have an appointment, please call our office ahead of time to schedule before presenting to the lab for your testing.

## 2020-12-04 ENCOUNTER — Encounter: Payer: Self-pay | Admitting: Physical Therapy

## 2020-12-04 ENCOUNTER — Ambulatory Visit: Payer: 59 | Admitting: Physical Therapy

## 2020-12-04 DIAGNOSIS — M25561 Pain in right knee: Secondary | ICD-10-CM

## 2020-12-04 DIAGNOSIS — R262 Difficulty in walking, not elsewhere classified: Secondary | ICD-10-CM

## 2020-12-04 DIAGNOSIS — R600 Localized edema: Secondary | ICD-10-CM

## 2020-12-04 DIAGNOSIS — M6281 Muscle weakness (generalized): Secondary | ICD-10-CM | POA: Diagnosis not present

## 2020-12-04 DIAGNOSIS — G8929 Other chronic pain: Secondary | ICD-10-CM

## 2020-12-04 NOTE — Therapy (Signed)
[address]. [place], [place], [address] Phone: [phone]   Fax:  [phone]  Physical Therapy Treatment  Patient Details  Name: Thomas Caldwell MRN: [id number] Date of Birth: Nov 19, 1945 Referring Provider (PT): Dr Lynne Leader   Encounter Date: 12/04/2020   PT End of Session - 12/04/20 0843     Visit Number 5    Number of Visits 20    Date for PT Re-Evaluation 12/20/20    PT Start Time 0759    PT Stop Time 0840    PT Time Calculation (min) 41 min    Activity Tolerance Patient tolerated treatment well    Behavior During Therapy [facility] for tasks assessed/performed             Past Medical History:  Diagnosis Date   ALLERGIC RHINITIS 01/11/2008   ANEURYSM 02/02/2007   brain   BRADYCARDIA, CHRONIC 10/06/2007   DIVERTICULOSIS, COLON 07/05/2007   EUSTACHIAN TUBE DYSFUNCTION 04/06/2009   GERD 07/05/2007   HYPERLIPIDEMIA 02/02/2007   HYPERTENSION 02/02/2007   Lumbago 01/11/2008   Peptic ulcer    PERSISTENT DISORDER INITIATING/MAINTAINING SLEEP 02/02/2007    Past Surgical History:  Procedure Laterality Date   APPENDECTOMY  1982   LEFT HEART CATH AND CORONARY ANGIOGRAPHY N/A 10/04/2019   Procedure: LEFT HEART CATH AND CORONARY ANGIOGRAPHY;  Surgeon: Troy Sine, MD;  Location: [facility];  Service: Cardiovascular;  Laterality: N/A;   LUMBAR DISC SURGERY  12/2005   Dr. Vertell Limber   [facility] Left brain  1992   due to brain aneurysm s/p repair    There were no vitals filed for this visit.   Subjective Assessment - 12/04/20 0803     Subjective "Good" only have pain when going up steps and squatting    Currently in Pain? No/denies                               [facility] Adult PT Treatment/Exercise - 12/04/20 0001       Lumbar Exercises: Aerobic   Nustep L4 x6 min LE only      Knee/Hip Exercises: Machines for Strengthening   Cybex Knee Extension 5# 3x10 really focus on the TKE, with 10 # had  pain    Cybex Knee Flexion 2 x 15 reps, 35#    Cybex Leg Press 40lb 2x10      Knee/Hip Exercises: Standing   Heel Raises Both;2 sets;15 reps;2 seconds    Lateral Step Up Both;1 set;10 reps;Hand Hold: 0;Step Height: 6"    Forward Step Up Both;1 set;10 reps;Hand Hold: 0;Step Height: 6"    Walking with Sports Cord 30lb side step over half foam roxx x10 each      Knee/Hip Exercises: Seated   Sit to Sand 5 reps;without UE support;3 sets;10 reps   elevated mat, LE on Airex, Last set no airex     Knee/Hip Exercises: Supine   Straight Leg Raises Both;1 set;10 reps    Straight Leg Raises Limitations 3    Other Supine Knee/Hip Exercises Abd 3lb x10 each    Other Supine Knee/Hip Exercises feet on ball K2C, bridge and isometric abs                       PT Short Term Goals - 11/01/20 8502       PT SHORT TERM GOAL #1   Title independent with initial HEP  Baseline Program modified    Status Partially Met    Target Date 08/02/20               PT Long Term Goals - 11/26/20 0851       PT LONG TERM GOAL #1   Title Increase R knee FOTO to 61%.    Status On-going      PT LONG TERM GOAL #2   Title Independent with advanced HEP    Status Partially Met      PT LONG TERM GOAL #3   Title Pt will report </= 2/10 knee and low back pain with functional activities and ADLs    Status Partially Met      PT LONG TERM GOAL #4   Title Pt will be able to negotiate stairs using safe reciprocal pattern with </= 2/10 pain to facilitate ability to negotiate staairs within the home    Status On-going                   Plan - 12/04/20 0843     Clinical Impression Statement Pt continues to report pain with stairs and squatting. Sit to stand with airex was difficult for patient. Wide legged stance with sit to stands from hard surface, pt stated that this causes less pain.Some instability with resisted side step over foam roll.    Stability/Clinical Decision Making  Stable/Uncomplicated    Rehab Potential Good    PT Frequency 2x / week    PT Duration 8 weeks    PT Treatment/Interventions ADLs/Self Care Home Management;Cryotherapy;Electrical Stimulation;Iontophoresis 4mg /ml Dexamethasone;Moist Heat;Gait training;Functional mobility training;Therapeutic activities;Therapeutic exercise;Balance training;Neuromuscular re-education;Manual techniques;Patient/family education;Vasopneumatic Device;Stair training;Passive range of motion;Dry needling;Taping    PT Next Visit Plan Continue to progress strengthening and stretch. Possible modalities for pain relief-Ionto             Patient will benefit from skilled therapeutic intervention in order to improve the following deficits and impairments:  Abnormal gait, Pain, Decreased balance, Impaired flexibility, Decreased strength, Decreased mobility, Decreased range of motion, Difficulty walking  Visit Diagnosis: Muscle weakness (generalized)  Chronic pain of left knee  Localized edema  Acute pain of right knee  Difficulty in walking, not elsewhere classified     Problem List Patient Active Problem List   Diagnosis Date Noted   Right otitis media 16/10/9602   Chronic systolic heart failure ([place]) 04/08/2020   Ventricular tachycardia    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   Palpitations 08/11/2019   Generalized abdominal pain 04/04/2019   Diarrhea 04/04/2019   Trigger finger, right ring finger 01/06/2019   Hyperglycemia 10/17/2018   Patellar subluxation, left, initial encounter 06/13/2018   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/2018   Greater trochanteric bursitis of right hip 04/18/2018   Nonallopathic lesion of sacral region 01/27/2017   Nonallopathic lesion of thoracic region 01/27/2017   Nonallopathic lesion of lumbosacral region 01/27/2017   Degenerative disc disease, lumbar 12/29/2016   External otitis of right ear 06/09/2016   Skin lesion 07/11/2015   Right Achilles tendinitis  07/05/2014   Unspecified constipation 10/01/2013   Vertigo 08/01/2013   Lateral epicondylitis  of elbow 07/07/2013   Other dysphagia 06/29/2013   Right elbow pain 06/29/2013   Posterior tibial tendinitis of right leg 03/22/2013   Gastroenteritis presumed infectious 02/13/2013   Right knee pain 11/08/2012   Closed fracture of left distal radius 10/25/2012   Renal insufficiency 06/24/2012   Abnormal TSH 06/24/2012   Recurrent epistaxis 01/19/2012   Fatigue  09/07/2011   Back pain 06/02/2011   Preventative health care 06/09/2010   Eustachian tube dysfunction, right 04/06/2009   NECK PAIN 03/27/2009   Allergic rhinitis 01/11/2008   Lumbago 01/11/2008   Chronic sinus bradycardia 10/06/2007   GERD 07/05/2007   Diverticulosis of colon 07/05/2007   Hyperlipidemia 02/02/2007   PERSISTENT DISORDER INITIATING/MAINTAINING SLEEP 02/02/2007   Essential hypertension 02/02/2007   ANEURYSM 02/02/2007    Scot Jun, PTA 12/04/2020, 8:48 AM  [address]. [place], [place], [address] Phone: [phone]   Fax:  [phone]  Name: Malosi Hemstreet MRN: [id number] Date of Birth: [birth year]

## 2020-12-06 ENCOUNTER — Ambulatory Visit: Payer: 59 | Admitting: Physical Therapy

## 2020-12-08 ENCOUNTER — Encounter: Payer: Self-pay | Admitting: Internal Medicine

## 2020-12-08 DIAGNOSIS — N401 Enlarged prostate with lower urinary tract symptoms: Secondary | ICD-10-CM | POA: Insufficient documentation

## 2020-12-08 DIAGNOSIS — R972 Elevated prostate specific antigen [PSA]: Secondary | ICD-10-CM | POA: Insufficient documentation

## 2020-12-08 DIAGNOSIS — R351 Nocturia: Secondary | ICD-10-CM | POA: Insufficient documentation

## 2020-12-08 DIAGNOSIS — E559 Vitamin D deficiency, unspecified: Secondary | ICD-10-CM | POA: Insufficient documentation

## 2020-12-08 NOTE — Assessment & Plan Note (Signed)
Lab Results  Component Value Date   HGBA1C 5.6 11/26/2020   Stable, pt to continue current medical treatment  - diet

## 2020-12-08 NOTE — Assessment & Plan Note (Signed)
Mild recent worsening, for trial flomax qhs

## 2020-12-08 NOTE — Assessment & Plan Note (Signed)
Lab Results  Component Value Date   LDLCALC 74 11/26/2020   Mild uncontrolled, goal ldl < 70, pt to continue current statin crestor 20 as declines change

## 2020-12-08 NOTE — Assessment & Plan Note (Signed)

## 2020-12-08 NOTE — Assessment & Plan Note (Signed)
BP Readings from Last 3 Encounters:  12/03/20 116/64  11/05/20 120/64  10/23/20 138/80   Stable, pt to continue medical treatment toprol, norvasc, entresto

## 2020-12-08 NOTE — Assessment & Plan Note (Addendum)
Asympt, for f/u psa at 6 month  . Lab Results  Component Value Date   PSA 2.78 11/26/2020   PSA 1.12 11/01/2019   PSA 1.73 10/17/2018

## 2020-12-08 NOTE — Assessment & Plan Note (Signed)
Last vitamin D Lab Results  Component Value Date   VD25OH 24.42 (L) 11/26/2020   Low, to start oral replacement

## 2020-12-09 ENCOUNTER — Encounter: Payer: Self-pay | Admitting: Family Medicine

## 2020-12-11 ENCOUNTER — Ambulatory Visit: Payer: 59 | Admitting: Physical Therapy

## 2020-12-17 ENCOUNTER — Ambulatory Visit: Payer: 59 | Admitting: Physical Therapy

## 2020-12-17 NOTE — Progress Notes (Signed)
   I, [name], LAT, ATC, am serving as scribe for Dr. [name].  Thomas Caldwell is a 75 y.o. male who presents to [facility] at [facility] today for f/u of R knee pain due to quad partial tear.  He is approximately 8 weeks s/p R quad tendon PRP.  He was last seen by Dr. Denyse Amass on 10/11/20 and had PRP injection to his R distal quad tendon.  He was provided a HEP and also referred to PT of which he has completed 5 sessions.  Today, pt reports that his pain has improved. Painful still with deep knee bending and stair climbing.   Diagnostic testing: R knee MRI- 09/28/20; R knee XR- 09/11/20 and 08/28/20; L knee MRI- 09/08/20; L knee XR- 07/10/20   Pertinent review of systems: No fevers or chills  Relevant historical information: Heart failure   Exam:  BP 120/68   Pulse (!) 58   Ht 6' (1.829 m)   SpO2 97%   BMI 26.31 kg/m  General: Well Developed, well nourished, and in no acute distress.   MSK: Right knee normal appearing. Mildly tender palpation distal quad tendon. Normal motion. Strength 4/5 extension with some pain.  Left knee normal-appearing Mildly tender palpation distal quad tendon. Normal motion. Strength 4+/5 extension with mild pain.  Normal gait   Lab and Radiology Results  Diagnostic Limited MSK Ultrasound of: Bilateral quad tendons Right quad tendon.  Intact laterally however some disruption at the medial aspect of the distal quad tendon with some scar formation now. Calcific change deep portion of distal quad tendon.  Left quad tendon:.  Intact throughout the entirety of the distal insertion site however there is some calcific change deep to the distal quadricep tendon. Impression:  Continued partial tear of the right distal quad tendon however some scar formation is now present. Calcific tendinitis bilateral quad tendons.      Assessment and Plan: 75 y.o. male with bilateral quadriceps tendinitis with partial tear right quad  tendon. Patient is improving following PRP injection right quad tendon and physical therapy but has not fully improved.  Plan to continue home exercise program following PT. Recheck as needed.  May be beneficial to have a repeat PRP injection. Patient would like to avoid surgery which I think is a good decision.   PDMP not reviewed this encounter. Orders Placed This Encounter  Procedures   [place] LIMITED JOINT SPACE STRUCTURES LOW RIGHT(NO LINKED CHARGES)    Order Specific Question:   Reason for Exam (SYMPTOM  OR DIAGNOSIS REQUIRED)    Answer:   quad pain    Order Specific Question:   Preferred imaging location?    Answer:   [facility]   No orders of the defined types were placed in this encounter.    Discussed warning signs or symptoms. Please see discharge instructions. Patient expresses understanding.   The above documentation has been reviewed and is accurate and complete [name], M.D.

## 2020-12-18 ENCOUNTER — Other Ambulatory Visit: Payer: Self-pay

## 2020-12-18 ENCOUNTER — Encounter: Payer: Self-pay | Admitting: Family Medicine

## 2020-12-18 ENCOUNTER — Ambulatory Visit: Payer: Self-pay

## 2020-12-18 ENCOUNTER — Ambulatory Visit: Payer: 59 | Admitting: Family Medicine

## 2020-12-18 VITALS — BP 120/68 | HR 58 | Ht 72.0 in

## 2020-12-18 DIAGNOSIS — M76899 Other specified enthesopathies of unspecified lower limb, excluding foot: Secondary | ICD-10-CM

## 2020-12-18 DIAGNOSIS — M65262 Calcific tendinitis, left lower leg: Secondary | ICD-10-CM

## 2020-12-18 DIAGNOSIS — S76111A Strain of right quadriceps muscle, fascia and tendon, initial encounter: Secondary | ICD-10-CM | POA: Diagnosis not present

## 2020-12-18 DIAGNOSIS — M79604 Pain in right leg: Secondary | ICD-10-CM | POA: Diagnosis not present

## 2020-12-18 DIAGNOSIS — S76109A Unspecified injury of unspecified quadriceps muscle, fascia and tendon, initial encounter: Secondary | ICD-10-CM

## 2020-12-18 DIAGNOSIS — M25562 Pain in left knee: Secondary | ICD-10-CM

## 2020-12-18 DIAGNOSIS — M25561 Pain in right knee: Secondary | ICD-10-CM

## 2020-12-18 DIAGNOSIS — G8929 Other chronic pain: Secondary | ICD-10-CM

## 2020-12-18 NOTE — Patient Instructions (Signed)
Thank you for coming in today.   Recheck as needed. Keep up the exercises.   I can do PRP any time. Repeat may help.

## 2020-12-31 ENCOUNTER — Encounter: Payer: Self-pay | Admitting: Family Medicine

## 2020-12-31 DIAGNOSIS — M76899 Other specified enthesopathies of unspecified lower limb, excluding foot: Secondary | ICD-10-CM

## 2020-12-31 DIAGNOSIS — S76109A Unspecified injury of unspecified quadriceps muscle, fascia and tendon, initial encounter: Secondary | ICD-10-CM

## 2020-12-31 DIAGNOSIS — M25561 Pain in right knee: Secondary | ICD-10-CM

## 2020-12-31 DIAGNOSIS — G8929 Other chronic pain: Secondary | ICD-10-CM

## 2020-12-31 IMAGING — MR MR CARD MORPHOLOGY WO/W CM
45 of 48 series · 45 of 48 positions shown · IV contrast (gadavist)
Comparison: none

CLINICAL DATA: 74-year-old male with h/o cardiomyopathy and
episodes of nonsustained ventricular tachycardias. Cardiac
catheterization showed only mild non-obstructive CAD.

EXAM:
CARDIAC MRI
TECHNIQUE: The patient was scanned on a 1.5 Tesla GE magnet. A dedicated
cardiac coil was used. Functional imaging was done using Fiesta
sequences. [DATE], and 4 chamber views were done to assess for RWMA's.
Modified Djabri rule using a short axis stack was used to
calculate an ejection fraction on a dedicated work station using
Circle software. The patient received 9 cc of Gadavist. After 10
minutes inversion recovery sequences were used to assess for
infiltration and scar tissue.
CONTRAST:  9 cc  of Gadavist

[Series 4: [id number] · axial · 8.0mm · 1.56mm/px · 1 of 20 slices shown]
[im 1/20]
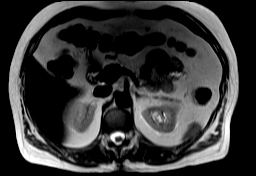

[Series 8: bSSFP · oblique · 8.0mm · 1.61mm/px · 1 of 25 slices shown (1 of 26)]
[im 1/25]
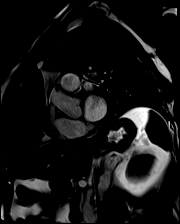

[Series 9: bSSFP · oblique · 8.0mm · 1.61mm/px · 1 of 25 slices shown (2 of 26)]
[im 1/25]
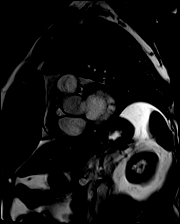

[Series 10: bSSFP · oblique · 8.0mm · 1.61mm/px · 1 of 25 slices shown (3 of 26)]
[im 1/25]
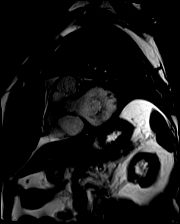

[Series 11: bSSFP · oblique · 8.0mm · 1.61mm/px · 1 of 25 slices shown (4 of 26)]
[im 1/25]
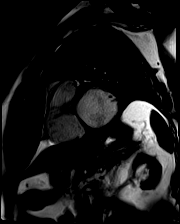

[Series 12: bSSFP · oblique · 8.0mm · 1.61mm/px · 1 of 25 slices shown (5 of 26)]
[im 1/25]
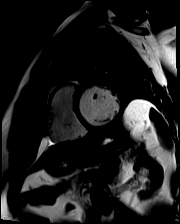

[Series 13: bSSFP · oblique · 8.0mm · 1.61mm/px · 1 of 25 slices shown (6 of 26)]
[im 1/25]
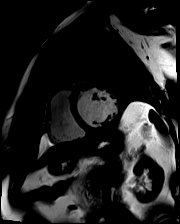

[Series 14: bSSFP · oblique · 8.0mm · 1.61mm/px · 1 of 25 slices shown (7 of 26)]
[im 1/25]
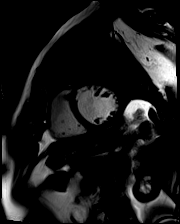

[Series 15: bSSFP · oblique · 8.0mm · 1.61mm/px · 1 of 25 slices shown (8 of 26)]
[im 1/25]
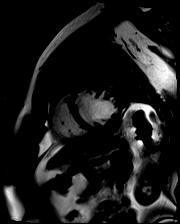

[Series 16: bSSFP · oblique · 8.0mm · 1.61mm/px · 1 of 25 slices shown (9 of 26)]
[im 1/25]
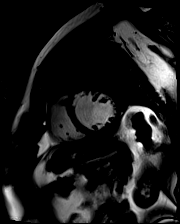

[Series 17: bSSFP · oblique · 8.0mm · 1.61mm/px · 1 of 25 slices shown (10 of 26)]
[im 1/25]
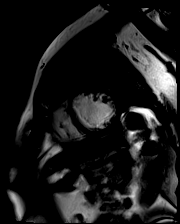

[Series 18: bSSFP · oblique · 8.0mm · 1.61mm/px · 1 of 25 slices shown (11 of 26)]
[im 1/25]
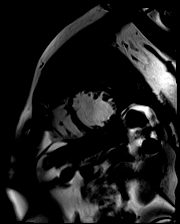

[Series 19: bSSFP · oblique · 8.0mm · 1.61mm/px · 1 of 25 slices shown (12 of 26)]
[im 1/25]
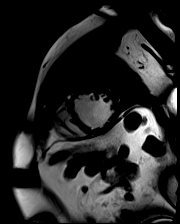

[Series 20: bSSFP · oblique · 8.0mm · 1.61mm/px · 1 of 25 slices shown (13 of 26)]
[im 1/25]
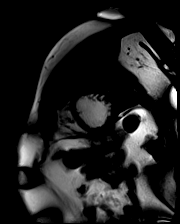

[Series 21: bSSFP · oblique · 8.0mm · 1.61mm/px · 1 of 25 slices shown (14 of 26)]
[im 1/25]
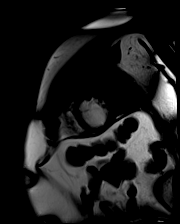

[Series 22: bSSFP · oblique · 8.0mm · 1.61mm/px · 1 of 25 slices shown (15 of 26)]
[im 1/25]
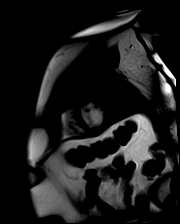

[Series 23: bSSFP · oblique · 8.0mm · 1.61mm/px · 1 of 25 slices shown (16 of 26)]
[im 1/25]
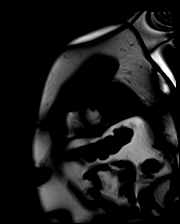

[Series 24: bSSFP · oblique · 8.0mm · 1.61mm/px · 1 of 25 slices shown (17 of 26)]
[im 1/25]
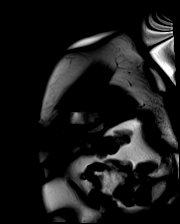

[Series 25: bSSFP · oblique · 8.0mm · 1.61mm/px · 1 of 25 slices shown (18 of 26)]
[im 1/25]
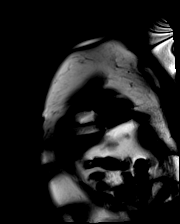

[Series 26: bSSFP · oblique · 8.0mm · 1.61mm/px · 1 of 25 slices shown (19 of 26)]
[im 1/25]
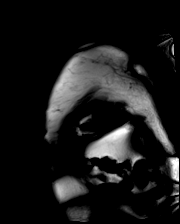

[Series 27: bSSFP · oblique · 8.0mm · 1.61mm/px · 1 of 25 slices shown (20 of 26)]
[im 1/25]
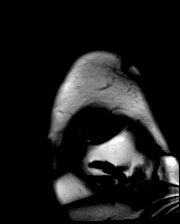

[Series 28: bSSFP · oblique · 6.0mm · 1.41mm/px · 1 of 25 slices shown (21 of 26)]
[im 1/25]
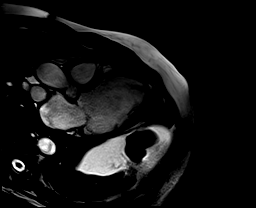

[Series 29: bSSFP · oblique · 6.0mm · 1.41mm/px · 1 of 25 slices shown (22 of 26)]
[im 1/25]
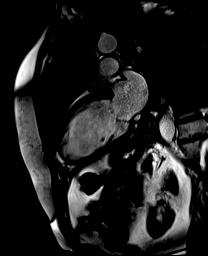

[Series 30: bSSFP · axial · 6.0mm · 1.41mm/px · 1 of 25 slices shown (23 of 26)]
[im 1/25]
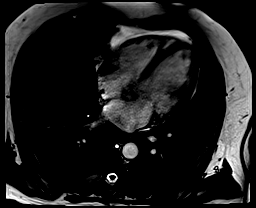

[Series 31: bSSFP · axial · 6.0mm · 1.41mm/px · 1 of 25 slices shown (24 of 26)]
[im 1/25]
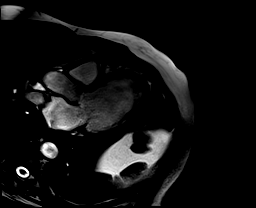

[Series 32: bSSFP · axial · 6.0mm · 1.41mm/px · 1 of 25 slices shown (25 of 26)]
[im 1/25]
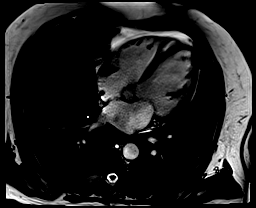

[Series 33: STIR · oblique · 8.0mm · 1.92mm/px · 1 of 17 slices shown]
[im 1/17]
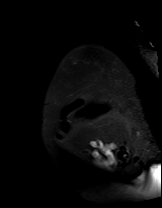

[Series 34: (id)_long_t1 · oblique · 8.0mm · 1.56mm/px · 1 of 24 slices shown]
[im 1/24]
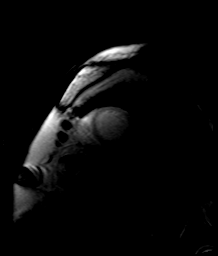

[Series 35: (id)_long_t1_moco · oblique · 8.0mm · 1.56mm/px · 1 of 24 slices shown]
[im 1/24]
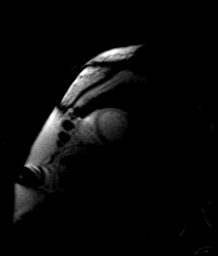

[Series 38: (id)_trufi · oblique · 8.0mm · 2.08mm/px · 1 of 9 slices shown]
[im 1/9]
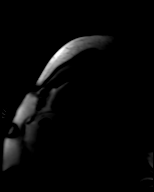

[Series 39: (id)_trufi_moco · oblique · 8.0mm · 2.08mm/px · 1 of 9 slices shown]
[im 1/9]
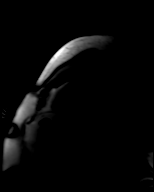

[Series 42: pre short axis · oblique · non-contrast · 8.0mm · 2.25mm/px · 1 of 10 slices shown (1 of 6)]
[im 1/10]
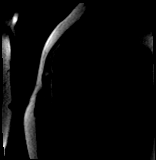

[Series 43: pre short axis · oblique · non-contrast · 8.0mm · 2.25mm/px · 1 of 10 slices shown (2 of 6)]
[im 1/10]
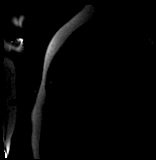

[Series 44: pre short axis · oblique · non-contrast · 8.0mm · 2.25mm/px · 1 of 10 slices shown (3 of 6)]
[im 1/10]
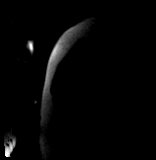

[Series 45: pre short axis · oblique · non-contrast · 8.0mm · 2.25mm/px · 1 of 10 slices shown (4 of 6)]
[im 1/10]
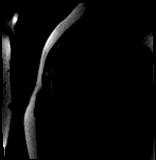

[Series 46: pre short axis · oblique · non-contrast · 8.0mm · 2.25mm/px · 1 of 10 slices shown (5 of 6)]
[im 1/10]
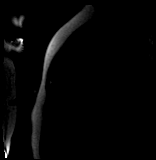

[Series 47: pre short axis · oblique · non-contrast · 8.0mm · 2.25mm/px · 1 of 10 slices shown (6 of 6)]
[im 1/10]
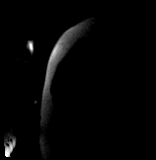

[Series 48: rest short axis · oblique · 8.0mm · 2.25mm/px · 1 of 80 slices shown (1 of 6)]
[im 1/80]
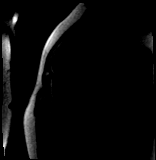

[Series 49: rest short axis · oblique · 8.0mm · 2.25mm/px · 1 of 80 slices shown (2 of 6)]
[im 1/80]
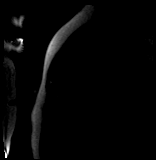

[Series 50: rest short axis · oblique · 8.0mm · 2.25mm/px · 1 of 80 slices shown (3 of 6)]
[im 1/80]
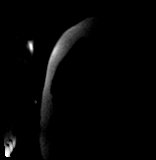

[Series 51: rest short axis · oblique · 8.0mm · 2.25mm/px · 1 of 80 slices shown (4 of 6)]
[im 1/80]
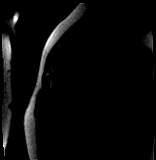

[Series 52: rest short axis · oblique · 8.0mm · 2.25mm/px · 1 of 80 slices shown (5 of 6)]
[im 1/80]
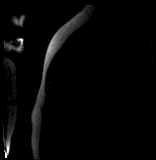

[Series 53: rest short axis · oblique · 8.0mm · 2.25mm/px · 1 of 80 slices shown (6 of 6)]
[im 1/80]
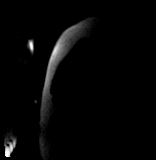

[Series 54: bSSFP · coronal · 6.0mm · 1.41mm/px · 1 of 25 slices shown (26 of 26)]
[im 1/25]
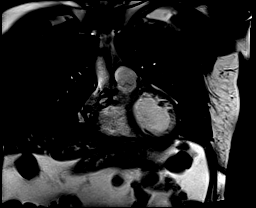

[Series 55: cine rvit · oblique · 6.0mm · 1.41mm/px · 1 of 25 slices shown]
[im 1/25]
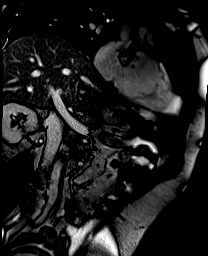

[45 of 48 positions shown; findings below may reference images not displayed]

FINDINGS: 1. Severely dilated left ventricle with normal wall thickness and
severely decreased systolic function (LVEF = 34%). There is diffuse
hypokinesis with septal dyssynchrony and no late gadolinium
enhancement in the left ventricular myocardium. There are prominent
trabeculations in the left ventricular myocardium with ratio of
non-compacted to compacted myocardium 3.3: 1.

LVEDD: 68 mm

LVESD: 56 mm

LVEDV: 262 ml

LVESV: 174 ml

SV: 88 ml

CO: 5.1 L/min

Myocardial mass: 141 g

2. Normal right ventricular size, thickness and systolic function
(LVEF = 55%). There are no regional wall motion abnormalities.

3.  Severely dilated left atrium and mildly dilated right atrium.

4. Normal size of the aortic root, ascending aorta. Mildly dilated
pulmonary artery measuring 33 mm.

5.  Mild to moderate mitral and mild tricuspid regurgitation.

6.  Normal pericardium.  No pericardial effusion.
IMPRESSION: 1. Severely dilated left ventricle with normal wall thickness and
severely decreased systolic function (LVEF = 34%). There is diffuse
hypokinesis with septal dyssynchrony and no late gadolinium
enhancement in the left ventricular myocardium. There are prominent
trabeculations in the left ventricular myocardium with ratio of
non-compacted to compacted myocardium 3.3: 1.

2. Normal right ventricular size, thickness and systolic function
(LVEF = 55%). There are no regional wall motion abnormalities.

3.  Severely dilated left atrium and mildly dilated right atrium.

4. Normal size of the aortic root, ascending aorta. Mildly dilated
pulmonary artery measuring 33 mm.

5.  Mild to moderate mitral and mild tricuspid regurgitation.

6.  Normal pericardium.  No pericardial effusion.

These findings are suspicious for a non-compaction type non-ischemic
cardiomyopathy. LVEF is severely decreased < 35%.

## 2021-01-08 ENCOUNTER — Encounter: Payer: Self-pay | Admitting: Orthopaedic Surgery

## 2021-01-08 ENCOUNTER — Other Ambulatory Visit: Payer: Self-pay

## 2021-01-08 ENCOUNTER — Ambulatory Visit: Payer: 59 | Admitting: Orthopaedic Surgery

## 2021-01-08 DIAGNOSIS — M76899 Other specified enthesopathies of unspecified lower limb, excluding foot: Secondary | ICD-10-CM | POA: Diagnosis not present

## 2021-01-08 NOTE — Progress Notes (Signed)
Office Visit Note   Patient: Thomas Caldwell           Date of Birth: 1945/07/13           MRN: [id number] Visit Date: 01/08/2021              Requested by: Rodolph Bong, MD [id number] [place],  [place] [address] PCP: Corwin Levins, MD   Assessment & Plan: Visit Diagnoses:  1. Tendinosis of quadriceps tendon     Plan: I reviewed the MRI scans in detail and they do show that he is got partial-thickness tearing on the articular side.  The majority of the quadriceps insertion is intact.  I explained that in order to get to the location of the tear I would have to either make an arthrotomy or detach the quadriceps altogether and then reattach everything.  I do not recommend surgical repair at this time.  I will check with DJ O to see if there is brace that will help assist his quadriceps.  He did state that the left one has felt much better since injury and I would expect the right 1 to feel better as well if we give this a little bit more time.  I am fine with him playing golf as long as he is careful and it does not cause any pain.  He may need to avoid certain hazards on the golf course.  Follow-Up Instructions: No follow-ups on file.   Orders:  No orders of the defined types were placed in this encounter.  No orders of the defined types were placed in this encounter.     Procedures: No procedures performed   Clinical Data: No additional findings.   Subjective: Chief Complaint  Patient presents with   Right Knee - Pain    Thomas Caldwell is a very pleasant and active and energetic 75 year old gentleman who is here for evaluation for bilateral partial quadriceps tears.  He initially injured the left one in June when he fell and then he injured the right 1 while compensating for the left 1 when he stepped in a hole.  He underwent PRP injection to the right knee without significant relief.  He reports that the left knee is feeling better and overall he really only has pain when  he goes from sit to stand or going up and down steps or when his heel strikes forcefully on the ground.  Denies any swelling or numbness and tingling.   Review of Systems  Constitutional: Negative.   All other systems reviewed and are negative.   Objective: Vital Signs: There were no vitals taken for this visit.  Physical Exam Vitals and nursing note reviewed.  Constitutional:      Appearance: He is well-developed.  Pulmonary:     Effort: Pulmonary effort is normal.  Abdominal:     Palpations: Abdomen is soft.  Skin:    General: Skin is warm.  Neurological:     Mental Status: He is alert and oriented to person, place, and time.  Psychiatric:        Behavior: Behavior normal.        Thought Content: Thought content normal.        Judgment: Judgment normal.    Ortho Exam  Bilateral knees show full range of motion.  I do not feel a palpable defect of the quadriceps.  He has decent strength against resistance.  No joint effusion.  Collaterals and cruciates are stable.  Specialty Comments:  No specialty comments available.  Imaging: No results found.   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   Palpitations 08/11/2019   Generalized abdominal pain 04/04/2019   Diarrhea 04/04/2019   Trigger finger, right ring finger 01/06/2019   Hyperglycemia 10/17/2018   Patellar subluxation, left, initial encounter 06/13/2018   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/2018   Greater trochanteric bursitis of right hip 04/18/2018   Nonallopathic lesion of sacral region 01/27/2017   Nonallopathic lesion of thoracic region 01/27/2017   Nonallopathic lesion of  lumbosacral region 01/27/2017   Degenerative disc disease, lumbar 12/29/2016   External otitis of right ear 06/09/2016   Skin lesion 07/11/2015   Right Achilles tendinitis 07/05/2014   Unspecified constipation 10/01/2013   Vertigo 08/01/2013   Lateral epicondylitis  of elbow 07/07/2013   Other dysphagia 06/29/2013   Right elbow pain 06/29/2013   Posterior tibial tendinitis of right leg 03/22/2013   Gastroenteritis presumed infectious 02/13/2013   Right knee pain 11/08/2012   Closed fracture of left distal radius 10/25/2012   Renal insufficiency 06/24/2012   Abnormal TSH 06/24/2012   Recurrent epistaxis 01/19/2012   Fatigue 09/07/2011   Back pain 06/02/2011   Encounter for well adult exam with abnormal findings 06/09/2010   Eustachian tube dysfunction, right 04/06/2009   NECK PAIN 03/27/2009   Allergic rhinitis 01/11/2008   Lumbago 01/11/2008   Chronic sinus bradycardia 10/06/2007   GERD 07/05/2007   Diverticulosis of colon 07/05/2007   Hyperlipidemia 02/02/2007   PERSISTENT DISORDER INITIATING/MAINTAINING SLEEP 02/02/2007   Essential hypertension 02/02/2007   ANEURYSM 02/02/2007   Past Medical History:  Diagnosis Date   ALLERGIC RHINITIS 01/11/2008   ANEURYSM 02/02/2007   brain   BRADYCARDIA, CHRONIC 10/06/2007   DIVERTICULOSIS, COLON 07/05/2007   EUSTACHIAN TUBE DYSFUNCTION 04/06/2009   GERD 07/05/2007   HYPERLIPIDEMIA 02/02/2007   HYPERTENSION 02/02/2007   Lumbago 01/11/2008   Peptic ulcer    PERSISTENT DISORDER INITIATING/MAINTAINING SLEEP 02/02/2007    Family History  Problem Relation Age of Onset   Heart disease Sister    Hypertension Father    Heart disease Father    Hypertension Brother        3 bother with HTN   Esophageal cancer Brother    Colon cancer Neg Hx    Rectal cancer Neg Hx    Stomach cancer Neg Hx     Past Surgical History:  Procedure Laterality Date   APPENDECTOMY  1982   LEFT HEART CATH AND CORONARY ANGIOGRAPHY N/A 10/04/2019   Procedure: LEFT HEART  CATH AND CORONARY ANGIOGRAPHY;  Surgeon: Lennette Bihari, MD;  Location: [facility];  Service: Cardiovascular;  Laterality: N/A;   LUMBAR DISC SURGERY  12/2005   Dr. Venetia Maxon   [facility] Left brain  1992   due to brain aneurysm s/p repair   Social History   Occupational History   Occupation: Copywriter, advertising: [employer]  Tobacco Use   Smoking status: Never   Smokeless tobacco: Never  Substance and Sexual Activity   Alcohol use: Yes    Comment: 0-1 per day   Drug use: No   Sexual activity: Yes    Birth control/protection: None

## 2021-01-09 ENCOUNTER — Other Ambulatory Visit: Payer: Self-pay | Admitting: *Deleted

## 2021-01-09 MED ORDER — DRONEDARONE HCL 400 MG PO TABS: 400.0000 mg | ORAL_TABLET | Freq: Two times a day (BID) | ORAL | 11 refills | Status: DC

## 2021-01-10 ENCOUNTER — Other Ambulatory Visit: Payer: Self-pay

## 2021-01-10 ENCOUNTER — Encounter: Payer: Self-pay | Admitting: Physical Therapy

## 2021-01-10 MED ORDER — ENTRESTO 49-51 MG PO TABS
1.0000 | ORAL_TABLET | Freq: Two times a day (BID) | ORAL | 9 refills | Status: DC
Start: 2021-01-10 — End: 2021-08-20

## 2021-01-27 ENCOUNTER — Other Ambulatory Visit: Payer: Self-pay | Admitting: Internal Medicine

## 2021-01-30 ENCOUNTER — Ambulatory Visit
Admission: EM | Admit: 2021-01-30 | Discharge: 2021-01-30 | Disposition: A | Discharge status: Home / Self Care | Payer: 59 | Attending: Physician Assistant | Admitting: Physician Assistant

## 2021-01-30 ENCOUNTER — Other Ambulatory Visit: Payer: Self-pay

## 2021-01-30 DIAGNOSIS — J069 Acute upper respiratory infection, unspecified: Secondary | ICD-10-CM | POA: Diagnosis not present

## 2021-01-30 DIAGNOSIS — R0981 Nasal congestion: Secondary | ICD-10-CM

## 2021-01-30 DIAGNOSIS — R051 Acute cough: Secondary | ICD-10-CM

## 2021-01-30 LAB — POCT INFLUENZA A/B
Influenza A, POC: NEGATIVE
Influenza B, POC: NEGATIVE

## 2021-01-30 MED ORDER — BENZONATATE 100 MG PO CAPS: 100.0000 mg | ORAL_CAPSULE | Freq: Three times a day (TID) | ORAL | 0 refills | Status: DC

## 2021-01-30 NOTE — ED Provider Notes (Signed)
[facility]    CSN: [id number] Arrival date & time: 01/30/21  0920      History   Chief Complaint Chief Complaint  Patient presents with   Nasal Congestion    HPI Thomas Caldwell is a 76 y.o. male.   Patient presents today with a 2 to 3-day history of URI symptoms.  Reports cough, nasal congestion, sore throat, drainage, hoarseness, subjective fever.  Denies any chest pain, nausea, vomiting, shortness of breath.  He has not tried any over-the-counter medication for symptom management.  Reports that he was around his grandson who was sick with a viral illness.  He is up-to-date on COVID-19 vaccination and flu shot.  He has not had COVID in the past.  He does have a significant cardiovascular history but denies any history of asthma or COPD.  He does have a history of allergies but states current symptoms are more extreme than previous episodes of this condition.  He does not smoke.  He denies any recent antibiotics.   Past Medical History:  Diagnosis Date   ALLERGIC RHINITIS 01/11/2008   ANEURYSM 02/02/2007   brain   BRADYCARDIA, CHRONIC 10/06/2007   DIVERTICULOSIS, COLON 07/05/2007   EUSTACHIAN TUBE DYSFUNCTION 04/06/2009   GERD 07/05/2007   HYPERLIPIDEMIA 02/02/2007   HYPERTENSION 02/02/2007   Lumbago 01/11/2008   Peptic ulcer    PERSISTENT DISORDER INITIATING/MAINTAINING SLEEP 02/02/2007    Patient Active Problem List   Diagnosis Date Noted   Vitamin D deficiency 12/08/2020   Increased prostate specific antigen (PSA) velocity 12/08/2020   BPH associated with nocturia 12/08/2020   Right otitis media 123456   Chronic systolic heart failure ([place]) 04/08/2020   Ventricular tachycardia    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   Palpitations 08/11/2019   Generalized abdominal pain 04/04/2019   Diarrhea 04/04/2019   Trigger finger, right ring finger 01/06/2019   Hyperglycemia 10/17/2018   Patellar subluxation, left, initial encounter 06/13/2018   Arthritis of  right sacroiliac joint 04/18/2018   Greater trochanteric bursitis of right hip 04/18/2018   Nonallopathic lesion of sacral region 01/27/2017   Nonallopathic lesion of thoracic region 01/27/2017   Nonallopathic lesion of lumbosacral region 01/27/2017   Degenerative disc disease, lumbar 12/29/2016   External otitis of right ear 06/09/2016   Skin lesion 07/11/2015   Right Achilles tendinitis 07/05/2014   Unspecified constipation 10/01/2013   Vertigo 08/01/2013   Lateral epicondylitis  of elbow 07/07/2013   Other dysphagia 06/29/2013   Right elbow pain 06/29/2013   Posterior tibial tendinitis of right leg 03/22/2013   Gastroenteritis presumed infectious 02/13/2013   Right knee pain 11/08/2012   Closed fracture of left distal radius 10/25/2012   Renal insufficiency 06/24/2012   Abnormal TSH 06/24/2012   Recurrent epistaxis 01/19/2012   Fatigue 09/07/2011   Back pain 06/02/2011   Encounter for well adult exam with abnormal findings 06/09/2010   Eustachian tube dysfunction, right 04/06/2009   NECK PAIN 03/27/2009   Allergic rhinitis 01/11/2008   Lumbago 01/11/2008   Chronic sinus bradycardia 10/06/2007   GERD 07/05/2007   Diverticulosis of colon 07/05/2007   Hyperlipidemia 02/02/2007   PERSISTENT DISORDER INITIATING/MAINTAINING SLEEP 02/02/2007   Essential hypertension 02/02/2007   ANEURYSM 02/02/2007    Past Surgical History:  Procedure Laterality Date   APPENDECTOMY  1982   LEFT HEART CATH AND CORONARY ANGIOGRAPHY N/A 10/04/2019   Procedure: LEFT HEART CATH AND CORONARY ANGIOGRAPHY;  Surgeon: Troy Sine, MD;  Location: [facility];  Service: Cardiovascular;  Laterality: N/A;   LUMBAR DISC SURGERY  12/2005   Dr. Vertell Limber   [facility] Left brain  1992   due to brain aneurysm s/p repair       Home Medications    Prior to Admission medications   Medication Sig Start Date End Date Taking? Authorizing Provider  benzonatate (TESSALON) 100 MG capsule Take 1 capsule (100 mg  total) by mouth every 8 (eight) hours. 01/30/21  Yes Jerrin Recore K, PA-C  amLODipine (NORVASC) 10 MG tablet TAKE 1 TABLET(10 MG) BY MOUTH DAILY 07/03/20   Biagio Borg, MD  aspirin 81 MG EC tablet Take 81 mg by mouth daily.    [provider]  dronedarone (MULTAQ) 400 MG tablet Take 1 tablet (400 mg total) by mouth 2 (two) times daily with a meal. 01/09/21   Deboraha Sprang, MD  guaiFENesin (MUCINEX) 600 MG 12 hr tablet Take 2 tablets (1,200 mg total) by mouth 2 (two) times daily as needed. 06/03/20   Biagio Borg, MD  metoprolol succinate (TOPROL XL) 25 MG 24 hr tablet Take 1 tablet (25 mg total) by mouth daily. 04/15/20   Deboraha Sprang, MD  naproxen (NAPROSYN) 500 MG tablet TAKE 1 TABLET(500 MG) BY MOUTH TWICE DAILY WITH A MEAL 01/27/21   Biagio Borg, MD  nitroGLYCERIN (NITRODUR - DOSED IN MG/24 HR) 0.2 mg/hr patch Apply 1/4 patch daily to tendon for tendonitis. 07/10/20   Gregor Hams, MD  pantoprazole (PROTONIX) 40 MG tablet Take 1 tablet (40 mg total) by mouth 2 (two) times daily before a meal. After one month take 1 tablet daily before a meal 11/05/20   Binnie Rail, MD  rosuvastatin (CRESTOR) 20 MG tablet TAKE 1 TABLET(20 MG) BY MOUTH DAILY 07/03/20   Biagio Borg, MD  sacubitril-valsartan (ENTRESTO) 49-51 MG Take 1 tablet by mouth 2 (two) times daily. 01/10/21   Vickie Epley, MD  traZODone (DESYREL) 50 MG tablet Take 0.5-1 tablets (25-50 mg total) by mouth at bedtime as needed for sleep. 11/05/20   Binnie Rail, MD    Family History Family History  Problem Relation Age of Onset   Heart disease Sister    Hypertension Father    Heart disease Father    Hypertension Brother        3 bother with HTN   Esophageal cancer Brother    Colon cancer Neg Hx    Rectal cancer Neg Hx    Stomach cancer Neg Hx     Social History Social History   Tobacco Use   Smoking status: Never   Smokeless tobacco: Never  Substance Use Topics   Alcohol use: Yes    Comment: 0-1 per day    Drug use: No     Allergies   Codeine   Review of Systems Review of Systems  Constitutional:  Positive for activity change, fatigue and fever. Negative for appetite change.  HENT:  Positive for congestion, postnasal drip and sinus pressure. Negative for sneezing and sore throat.   Respiratory:  Positive for cough. Negative for shortness of breath.   Cardiovascular:  Negative for chest pain.  Gastrointestinal:  Negative for abdominal pain, diarrhea, nausea and vomiting.  Musculoskeletal:  Negative for arthralgias and myalgias.  Neurological:  Negative for dizziness, light-headedness and headaches.    Physical Exam Triage Vital Signs ED Triage Vitals [01/30/21 1004]  Enc Vitals Group     BP 119/72     Pulse Rate 62  Resp 18     Temp 98.8 F (37.1 C)     Temp Source Oral     SpO2 95 %     Weight      Height      Head Circumference      Peak Flow      Pain Score 2     Pain Loc      Pain Edu?      Excl. in [place]?    No data found.  Updated Vital Signs BP 119/72 (BP Location: Right Arm)    Pulse 62    Temp 98.8 F (37.1 C) (Oral)    Resp 18    SpO2 95%   Visual Acuity Right Eye Distance:   Left Eye Distance:   Bilateral Distance:    Right Eye Near:   Left Eye Near:    Bilateral Near:     Physical Exam Vitals reviewed.  Constitutional:      General: He is awake.     Appearance: Normal appearance. He is well-developed. He is not ill-appearing.     Comments: Very pleasant male appears stated age in no acute distress sitting comfortably in exam room  HENT:     Head: Normocephalic and atraumatic.     Right Ear: Tympanic membrane, ear canal and external ear normal. Tympanic membrane is not erythematous or bulging.     Left Ear: Tympanic membrane, ear canal and external ear normal. Tympanic membrane is not erythematous or bulging.     Nose: Nose normal.     Mouth/Throat:     Pharynx: Uvula midline. Posterior oropharyngeal erythema present. No oropharyngeal exudate  or uvula swelling.  Cardiovascular:     Rate and Rhythm: Normal rate and regular rhythm.     Heart sounds: Normal heart sounds, S1 normal and S2 normal. No murmur heard. Pulmonary:     Effort: Pulmonary effort is normal. No accessory muscle usage or respiratory distress.     Breath sounds: Normal breath sounds. No stridor. No wheezing, rhonchi or rales.     Comments: Clear to auscultation bilaterally Abdominal:     General: Bowel sounds are normal.     Palpations: Abdomen is soft.     Tenderness: There is no abdominal tenderness.  Neurological:     Mental Status: He is alert.  Psychiatric:        Behavior: Behavior is cooperative.     UC Treatments / Results  Labs (all labs ordered are listed, but only abnormal results are displayed) Labs Reviewed  POCT INFLUENZA A/B    EKG   Radiology No results found.  Procedures Procedures (including critical care time)  Medications Ordered in UC Medications - No data to display  Initial Impression / Assessment and Plan / UC Course  I have reviewed the triage vital signs and the nursing notes.  Pertinent labs & imaging results that were available during my care of the patient were reviewed by me and considered in my medical decision making (see chart for details).     Discussed likely viral etiology given short duration of symptoms.  No evidence of acute infection on physical exam that would warrant initiation of antibiotics.  Patient is well-appearing, nontoxic, afebrile, nontachycardic.  Flu testing was negative in clinic today.  Discussed potential utility of COVID-19 testing particularly given chronic medical conditions but patient reports that he had had an at-home COVID test that was negative earlier today and declined additional testing.  Recommended that he repeat COVID testing  in a few days as he would likely benefit from antivirals if positive and encouraged him to reach out to PCP if necessary.  Recommended he use  over-the-counter medication including Mucinex, Flonase, Tylenol for symptom relief.  He was given Tessalon for cough.  Recommended rest and drinking plenty of fluid.  Discussed alarm symptoms that warrant emergent evaluation including high fever, shortness of breath, chest pain, nausea/vomiting, weakness.  Strict return precautions given to which he expressed understanding.  Final Clinical Impressions(s) / UC Diagnoses   Final diagnoses:  Upper respiratory tract infection, unspecified type  Acute cough  Nasal congestion     Discharge Instructions      You were negative for flu.  I would recommend that you repeat your at-home COVID test in a couple of days and if this is positive he should contact your primary care provider to consider antiviral therapy.  At this point, it is likely a virus and you do not need an antibiotic based on today's exam.  Please use Tessalon for cough.  Use over-the-counter medications including plain Mucinex, Tylenol, Flonase for symptom relief.  Make sure you drink plenty of fluid.  If you have any worsening symptoms including chest pain, shortness of breath, fever, nausea/vomiting interfering with oral intake, weakness you to go to the emergency room.  If symptoms do not improve within a week please return here see her PCP.     ED Prescriptions     Medication Sig Dispense Auth. Provider   benzonatate (TESSALON) 100 MG capsule Take 1 capsule (100 mg total) by mouth every 8 (eight) hours. 21 capsule Blayze Haen K, PA-C      PDMP not reviewed this encounter.   Terrilee Croak, PA-C 01/30/21 1115

## 2021-01-30 NOTE — Discharge Instructions (Signed)
You were negative for flu.  I would recommend that you repeat your at-home COVID test in a couple of days and if this is positive he should contact your primary care provider to consider antiviral therapy.  At this point, it is likely a virus and you do not need an antibiotic based on today's exam.  Please use Tessalon for cough.  Use over-the-counter medications including plain Mucinex, Tylenol, Flonase for symptom relief.  Make sure you drink plenty of fluid.  If you have any worsening symptoms including chest pain, shortness of breath, fever, nausea/vomiting interfering with oral intake, weakness you to go to the emergency room.  If symptoms do not improve within a week please return here see her PCP.

## 2021-01-30 NOTE — ED Triage Notes (Signed)
Pt c/o head congestion, and sore throat that has been going on for 3 days.

## 2021-01-31 ENCOUNTER — Ambulatory Visit: Payer: 59

## 2021-01-31 ENCOUNTER — Telehealth: Payer: 59 | Admitting: Family Medicine

## 2021-02-06 ENCOUNTER — Ambulatory Visit (INDEPENDENT_AMBULATORY_CARE_PROVIDER_SITE_OTHER): Payer: 59 | Admitting: Internal Medicine

## 2021-02-06 ENCOUNTER — Encounter: Payer: Self-pay | Admitting: Internal Medicine

## 2021-02-06 ENCOUNTER — Other Ambulatory Visit: Payer: Self-pay

## 2021-02-06 VITALS — BP 120/60 | HR 60 | Temp 98.1°F | Ht 72.0 in | Wt 197.0 lb

## 2021-02-06 DIAGNOSIS — I10 Essential (primary) hypertension: Secondary | ICD-10-CM | POA: Diagnosis not present

## 2021-02-06 DIAGNOSIS — E559 Vitamin D deficiency, unspecified: Secondary | ICD-10-CM

## 2021-02-06 DIAGNOSIS — J019 Acute sinusitis, unspecified: Secondary | ICD-10-CM

## 2021-02-06 DIAGNOSIS — E7800 Pure hypercholesterolemia, unspecified: Secondary | ICD-10-CM

## 2021-02-06 DIAGNOSIS — R739 Hyperglycemia, unspecified: Secondary | ICD-10-CM

## 2021-02-06 MED ORDER — HYDROCODONE BIT-HOMATROP MBR 5-1.5 MG/5ML PO SOLN: 5.0000 mL | Freq: Four times a day (QID) | ORAL | 0 refills | Status: DC | PRN

## 2021-02-06 MED ORDER — CEFDINIR 300 MG PO CAPS: 300.0000 mg | ORAL_CAPSULE | Freq: Two times a day (BID) | ORAL | 0 refills | Status: DC

## 2021-02-06 NOTE — Patient Instructions (Signed)
Please take all new medication as prescribed - the antibiotic, and cough medicine ° °Please continue all other medications as before, and refills have been done if requested. ° °Please have the pharmacy call with any other refills you may need. ° °Please continue your efforts at being more active, low cholesterol diet, and weight control. ° °Please keep your appointments with your specialists as you may have planned ° ° ° °

## 2021-02-06 NOTE — Progress Notes (Signed)
Patient ID: Thomas Caldwell, male   DOB: 11-16-1945, 76 y.o.   MRN: [id number]        Chief Complaint: follow up sinus symptoms, htn, hld, hyperglycemia, low vit d       HPI:  Thomas Caldwell is a 76 y.o. male  Here with 2-3 days acute onset fever, facial pain, pressure, headache, general weakness and malaise, and greenish d/c, with mild ST and cough, but pt denies chest pain, wheezing, increased sob or doe, orthopnea, PND, increased LE swelling, palpitations, dizziness or syncope.  Not taking Vit D.   Pt denies polydipsia, polyuria, or new focal neuro s/s.    Pt denies recent wt loss, night sweats, loss of appetite, or other constitutional symptoms       Wt Readings from Last 3 Encounters:  02/06/21 197 lb (89.4 kg)  12/03/20 194 lb (88 kg)  11/05/20 195 lb (88.5 kg)   BP Readings from Last 3 Encounters:  02/06/21 120/60  01/30/21 119/72  12/18/20 120/68         Past Medical History:  Diagnosis Date   ALLERGIC RHINITIS 01/11/2008   ANEURYSM 02/02/2007   brain   BRADYCARDIA, CHRONIC 10/06/2007   DIVERTICULOSIS, COLON 07/05/2007   EUSTACHIAN TUBE DYSFUNCTION 04/06/2009   GERD 07/05/2007   HYPERLIPIDEMIA 02/02/2007   HYPERTENSION 02/02/2007   Lumbago 01/11/2008   Peptic ulcer    PERSISTENT DISORDER INITIATING/MAINTAINING SLEEP 02/02/2007   Past Surgical History:  Procedure Laterality Date   APPENDECTOMY  1982   LEFT HEART CATH AND CORONARY ANGIOGRAPHY N/A 10/04/2019   Procedure: LEFT HEART CATH AND CORONARY ANGIOGRAPHY;  Surgeon: Lennette Bihari, MD;  Location: [facility];  Service: Cardiovascular;  Laterality: N/A;   LUMBAR DISC SURGERY  12/2005   Dr. Venetia Maxon   [facility] Left brain  1992   due to brain aneurysm s/p repair    reports that he has never smoked. He has never used smokeless tobacco. He reports current alcohol use. He reports that he does not use drugs. family history includes Esophageal cancer in his brother; Heart disease in his father and sister; Hypertension in his  brother and father. Allergies  Allergen Reactions   Codeine Other (See Comments)    Confusion   Current Outpatient Medications on File Prior to Visit  Medication Sig Dispense Refill   amLODipine (NORVASC) 10 MG tablet TAKE 1 TABLET(10 MG) BY MOUTH DAILY 90 tablet 2   aspirin 81 MG EC tablet Take 81 mg by mouth daily.     benzonatate (TESSALON) 100 MG capsule Take 1 capsule (100 mg total) by mouth every 8 (eight) hours. 21 capsule 0   dronedarone (MULTAQ) 400 MG tablet Take 1 tablet (400 mg total) by mouth 2 (two) times daily with a meal. 60 tablet 11   guaiFENesin (MUCINEX) 600 MG 12 hr tablet Take 2 tablets (1,200 mg total) by mouth 2 (two) times daily as needed. 60 tablet 1   metoprolol succinate (TOPROL XL) 25 MG 24 hr tablet Take 1 tablet (25 mg total) by mouth daily. 90 tablet 3   naproxen (NAPROSYN) 500 MG tablet TAKE 1 TABLET(500 MG) BY MOUTH TWICE DAILY WITH A MEAL 60 tablet 2   rosuvastatin (CRESTOR) 20 MG tablet TAKE 1 TABLET(20 MG) BY MOUTH DAILY 90 tablet 2   sacubitril-valsartan (ENTRESTO) 49-51 MG Take 1 tablet by mouth 2 (two) times daily. 60 tablet 9   traZODone (DESYREL) 50 MG tablet Take 0.5-1 tablets (25-50 mg total) by mouth at bedtime as  needed for sleep. 30 tablet 3   nitroGLYCERIN (NITRODUR - DOSED IN MG/24 HR) 0.2 mg/hr patch Apply 1/4 patch daily to tendon for tendonitis. (Patient not taking: Reported on 02/06/2021) 30 patch 1   pantoprazole (PROTONIX) 40 MG tablet Take 1 tablet (40 mg total) by mouth 2 (two) times daily before a meal. After one month take 1 tablet daily before a meal (Patient not taking: Reported on 02/06/2021) 60 tablet 3   Current Facility-Administered Medications on File Prior to Visit  Medication Dose Route Frequency Provider Last Rate Last Admin   sodium chloride flush (NS) 0.9 % injection 3 mL  3 mL Intravenous Q12H Duke Salvia, MD            ROS:  All others reviewed and negative.  Objective        PE:  BP 120/60 (BP Location: Right  Arm, Patient Position: Sitting, Cuff Size: Large)    Pulse 60    Temp 98.1 F (36.7 C) (Oral)    Ht 6' (1.829 m)    Wt 197 lb (89.4 kg)    SpO2 95%    BMI 26.72 kg/m                 Constitutional: Pt appears in NAD               HENT: Head: NCAT.                Right Ear: External ear normal.                 Left Ear: External ear normal. Bilat tm's with mild erythema.  Max sinus areas mild tender.  Pharynx with mild erythema, no exudate               Eyes: . Pupils are equal, round, and reactive to light. Conjunctivae and EOM are normal               Nose: without d/c or deformity               Neck: Neck supple. Gross normal ROM               Cardiovascular: Normal rate and regular rhythm.                 Pulmonary/Chest: Effort normal and breath sounds without rales or wheezing.                Abd:  Soft, NT, ND, + BS, no organomegaly               Neurological: Pt is alert. At baseline orientation, motor grossly intact               Skin: Skin is warm. No rashes, no other new lesions, LE edema - none               Psychiatric: Pt behavior is normal without agitation   Micro: none  Cardiac tracings I have personally interpreted today:  none  Pertinent Radiological findings (summarize): none   Lab Results  Component Value Date   WBC 6.3 11/26/2020   HGB 14.4 11/26/2020   HCT 44.0 11/26/2020   PLT 278.0 11/26/2020   GLUCOSE 105 (H) 11/26/2020   CHOL 150 11/26/2020   TRIG 146.0 11/26/2020   HDL 46.60 11/26/2020   LDLCALC 74 11/26/2020   ALT 33 11/26/2020   AST 32 11/26/2020   NA 139 11/26/2020   K 5.0 11/26/2020  CL 104 11/26/2020   CREATININE 1.28 11/26/2020   BUN 16 11/26/2020   CO2 26 11/26/2020   TSH 4.15 11/26/2020   PSA 2.78 11/26/2020   HGBA1C 5.6 11/26/2020   Assessment/Plan:  Thomas Caldwell is a 76 y.o. White or Caucasian [1] male with  has a past medical history of ALLERGIC RHINITIS (01/11/2008), ANEURYSM (02/02/2007), BRADYCARDIA, CHRONIC (10/06/2007),  DIVERTICULOSIS, COLON (07/05/2007), EUSTACHIAN TUBE DYSFUNCTION (04/06/2009), GERD (07/05/2007), HYPERLIPIDEMIA (02/02/2007), HYPERTENSION (02/02/2007), Lumbago (01/11/2008), Peptic ulcer, and PERSISTENT DISORDER INITIATING/MAINTAINING SLEEP (02/02/2007).  Vitamin D deficiency Last vitamin D Lab Results  Component Value Date   VD25OH 24.42 (L) 11/26/2020   Low, to start oral replacement   Acute sinus infection Mild to mod, for antibx course,  to f/u any worsening symptoms or concerns  Essential hypertension BP Readings from Last 3 Encounters:  02/06/21 120/60  01/30/21 119/72  12/18/20 120/68   Stable, pt to continue medical treatment entresto, toprol, norvasc   Hyperglycemia Lab Results  Component Value Date   HGBA1C 5.6 11/26/2020   Stable, pt to continue current medical treatment  - diet   Hyperlipidemia Lab Results  Component Value Date   LDLCALC 74 11/26/2020   Mild uncontrolled, goal ldl < 70, pt to continue current statin crestor 20 as declines change, for lower chol diet  Followup: Return if symptoms worsen or fail to improve.  Oliver Barre, MD 02/13/2021 4:26 AM [facility]

## 2021-02-10 ENCOUNTER — Encounter: Payer: Self-pay | Admitting: Internal Medicine

## 2021-02-10 MED ORDER — HYDROCODONE BIT-HOMATROP MBR 5-1.5 MG/5ML PO SOLN: 5.0000 mL | Freq: Four times a day (QID) | ORAL | 0 refills | Status: AC | PRN

## 2021-02-10 NOTE — Telephone Encounter (Signed)
Pt is calling in for a update on for Rx to be sent to the [facility] on [address].   Pt states that the Pharmacy sent over a fax.

## 2021-02-13 ENCOUNTER — Encounter: Payer: Self-pay | Admitting: Internal Medicine

## 2021-02-13 NOTE — Assessment & Plan Note (Signed)
BP Readings from Last 3 Encounters:  02/06/21 120/60  01/30/21 119/72  12/18/20 120/68   Stable, pt to continue medical treatment entresto, toprol, norvasc

## 2021-02-13 NOTE — Assessment & Plan Note (Signed)
Lab Results  Component Value Date   HGBA1C 5.6 11/26/2020   Stable, pt to continue current medical treatment  - diet  

## 2021-02-13 NOTE — Assessment & Plan Note (Signed)
Lab Results  Component Value Date   LDLCALC 74 11/26/2020   Mild uncontrolled, goal ldl < 70, pt to continue current statin crestor 20 as declines change, for lower chol diet

## 2021-02-13 NOTE — Assessment & Plan Note (Signed)
Mild to mod, for antibx course,  to f/u any worsening symptoms or concerns 

## 2021-02-13 NOTE — Assessment & Plan Note (Signed)
Last vitamin D Lab Results  Component Value Date   VD25OH 24.42 (L) 11/26/2020   Low, to start oral replacement  

## 2021-02-17 ENCOUNTER — Encounter: Payer: Self-pay | Admitting: Internal Medicine

## 2021-02-17 DIAGNOSIS — R059 Cough, unspecified: Secondary | ICD-10-CM

## 2021-02-17 DIAGNOSIS — R61 Generalized hyperhidrosis: Secondary | ICD-10-CM

## 2021-02-19 ENCOUNTER — Other Ambulatory Visit: Payer: Self-pay

## 2021-02-19 ENCOUNTER — Ambulatory Visit (INDEPENDENT_AMBULATORY_CARE_PROVIDER_SITE_OTHER)
Admission: RE | Admit: 2021-02-19 | Discharge: 2021-02-19 | Disposition: A | Discharge status: Home / Self Care | Payer: 59 | Source: Ambulatory Visit | Attending: Internal Medicine | Admitting: Internal Medicine

## 2021-02-19 DIAGNOSIS — R059 Cough, unspecified: Secondary | ICD-10-CM

## 2021-02-19 DIAGNOSIS — R61 Generalized hyperhidrosis: Secondary | ICD-10-CM | POA: Diagnosis not present

## 2021-02-21 ENCOUNTER — Other Ambulatory Visit: Payer: Self-pay | Admitting: Internal Medicine

## 2021-02-21 ENCOUNTER — Telehealth: Payer: Self-pay

## 2021-02-21 MED ORDER — AMOXICILLIN-POT CLAVULANATE 875-125 MG PO TABS: 1.0000 | ORAL_TABLET | Freq: Two times a day (BID) | ORAL | 0 refills | Status: DC

## 2021-02-21 NOTE — Telephone Encounter (Signed)
Patient notified that prescription has been resent

## 2021-02-21 NOTE — Telephone Encounter (Signed)
Pt calling in wanting the Rx for: amoxicillin-clavulanate (AUGMENTIN) 875-125 MG tablet  To be called into another Pharmacy as he is current in [place].  Pharmacy: [id number] - [place] - [address] AT [facility] OF [address]  Pt [phone]

## 2021-03-01 ENCOUNTER — Encounter: Payer: Self-pay | Admitting: Internal Medicine

## 2021-03-28 ENCOUNTER — Other Ambulatory Visit: Payer: Self-pay | Admitting: Internal Medicine

## 2021-03-28 NOTE — Telephone Encounter (Signed)
Please refill as per office routine med refill policy (all routine meds to be refilled for 3 mo or monthly (per pt preference) up to one year from last visit, then month to month grace period for 3 mo, then further med refills will have to be denied) ? ?

## 2021-04-04 ENCOUNTER — Other Ambulatory Visit: Payer: Self-pay

## 2021-04-04 MED ORDER — METOPROLOL SUCCINATE ER 25 MG PO TB24: 25.0000 mg | ORAL_TABLET | Freq: Every day | ORAL | 1 refills | Status: DC

## 2021-04-07 ENCOUNTER — Ambulatory Visit (HOSPITAL_COMMUNITY): Payer: 59 | Attending: Internal Medicine

## 2021-04-07 ENCOUNTER — Other Ambulatory Visit: Payer: Self-pay

## 2021-04-07 DIAGNOSIS — I340 Nonrheumatic mitral (valve) insufficiency: Secondary | ICD-10-CM

## 2021-04-07 DIAGNOSIS — R002 Palpitations: Secondary | ICD-10-CM | POA: Insufficient documentation

## 2021-04-07 DIAGNOSIS — I472 Ventricular tachycardia, unspecified: Secondary | ICD-10-CM | POA: Insufficient documentation

## 2021-04-07 LAB — ECHOCARDIOGRAM COMPLETE
Area-P 1/2: 1.69 cm2
Calc EF: 39.9 %
S' Lateral: 4.7 cm
Single Plane A2C EF: 43.9 %
Single Plane A4C EF: 36.4 %

## 2021-04-07 MED ORDER — PERFLUTREN LIPID MICROSPHERE
1.0000 mL | INTRAVENOUS | Status: AC | PRN
  Administered 2021-04-07: 2 mL via INTRAVENOUS

## 2021-04-25 ENCOUNTER — Other Ambulatory Visit (INDEPENDENT_AMBULATORY_CARE_PROVIDER_SITE_OTHER): Payer: 59

## 2021-04-25 ENCOUNTER — Encounter: Payer: Self-pay | Admitting: Internal Medicine

## 2021-04-25 ENCOUNTER — Ambulatory Visit: Payer: 59 | Admitting: Internal Medicine

## 2021-04-25 VITALS — BP 136/72 | HR 47 | Ht 72.0 in | Wt 194.0 lb

## 2021-04-25 DIAGNOSIS — R972 Elevated prostate specific antigen [PSA]: Secondary | ICD-10-CM | POA: Diagnosis not present

## 2021-04-25 DIAGNOSIS — R739 Hyperglycemia, unspecified: Secondary | ICD-10-CM | POA: Diagnosis not present

## 2021-04-25 DIAGNOSIS — I4729 Other ventricular tachycardia: Secondary | ICD-10-CM

## 2021-04-25 DIAGNOSIS — I5022 Chronic systolic (congestive) heart failure: Secondary | ICD-10-CM

## 2021-04-25 DIAGNOSIS — R001 Bradycardia, unspecified: Secondary | ICD-10-CM

## 2021-04-25 DIAGNOSIS — E559 Vitamin D deficiency, unspecified: Secondary | ICD-10-CM

## 2021-04-25 LAB — BASIC METABOLIC PANEL
BUN: 20 mg/dL (ref 6–23)
CO2: 25 mEq/L (ref 19–32)
Calcium: 10 mg/dL (ref 8.4–10.5)
Chloride: 107 mEq/L (ref 96–112)
Creatinine, Ser: 1.35 mg/dL (ref 0.40–1.50)
GFR: 51.5 mL/min — ABNORMAL LOW (ref >60.00)
Glucose, Bld: 110 mg/dL — ABNORMAL HIGH (ref 70–99)
Potassium: 4.4 mEq/L (ref 3.5–5.1)
Sodium: 141 mEq/L (ref 135–145)

## 2021-04-25 LAB — HEMOGLOBIN A1C: Hgb A1c MFr Bld: 5.5 % (ref 4.6–6.5)

## 2021-04-25 LAB — PSA: PSA: 1.52 ng/mL (ref 0.10–4.00)

## 2021-04-25 LAB — VITAMIN D 25 HYDROXY (VIT D DEFICIENCY, FRACTURES): VITD: 37.48 ng/mL (ref 30.00–100.00)

## 2021-04-25 NOTE — Progress Notes (Signed)
8.  Thomas Caldwell.  He will ? ? ? ? ?Patient Care Team: ?Biagio Borg, MD as PCP - General ?Deboraha Sprang, MD as PCP - Electrophysiology (Cardiology) ? ? ?HPI ? ?Thomas Caldwell is a 76 y.o. 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  cMRI was questioning the possibility of LV [place] with prominent trabeculations ? ?Flecainide initially used for PVC suppression.  Associate with left bundle branch block and discontinued ?Saw Dr. Daune Perch in consultation regarding catheter ablation; elected drug therapy given the concern of noncompaction and at least one of his morphologies (had a dominant one plus a scattering of others) and was started on dronaderone.  ? ?The patient denies chest pain, shortness of breath, nocturnal dyspnea, orthopnea or peripheral edema.  There have been no palpitations, lightheadedness or syncope.  .  ? ?DATE TEST EF   ?9/21 Echo   30-35 %   ?9/21 LHC  <25 % LADm-60  ?10/21 cMRI 34% LV dilated ?Prominent trabeculations  ? LVNC ?LGE neg  ?12/21 Echo  30-35%   ?3/23 Echo  40-45%   ? ?Date PVCs  ?9/21 7.9  ?6/22 7.9  ? ?Date Cr K Hgb  ?12/21 1.2 4.7 14.4  ? 3/23 1.35 4.4 14.4 (11/22)  ? ? ? ? ? ?Records and Results Reviewed  ? ?Past Medical History:  ?Diagnosis Date  ? ALLERGIC RHINITIS 01/11/2008  ? ANEURYSM 02/02/2007  ? brain  ? BRADYCARDIA, CHRONIC 10/06/2007  ? DIVERTICULOSIS, COLON 07/05/2007  ? EUSTACHIAN TUBE DYSFUNCTION 04/06/2009  ? GERD 07/05/2007  ? HYPERLIPIDEMIA 02/02/2007  ? HYPERTENSION 02/02/2007  ? Lumbago 01/11/2008  ? Peptic ulcer   ? PERSISTENT DISORDER INITIATING/MAINTAINING SLEEP 02/02/2007  ? ? ?Past Surgical History:  ?Procedure Laterality Date  ? APPENDECTOMY  1982  ? LEFT HEART CATH AND CORONARY ANGIOGRAPHY N/A 10/04/2019  ? Procedure: LEFT HEART CATH AND CORONARY ANGIOGRAPHY;  Surgeon: Troy Sine, MD;  Location: [facility];  Service: Cardiovascular;  Laterality: N/A;  ? LUMBAR [place] SURGERY  12/2005  ? Dr. Vertell Limber  ? [place] Left brain   1992  ? due to brain aneurysm s/p repair  ? ? ?Current Meds  ?Medication Sig  ? amLODipine (NORVASC) 10 MG tablet TAKE 1 TABLET(10 MG) BY MOUTH DAILY  ? aspirin 81 MG EC tablet Take 81 mg by mouth daily.  ? dronedarone (MULTAQ) 400 MG tablet Take 1 tablet (400 mg total) by mouth 2 (two) times daily with a meal.  ? guaiFENesin (MUCINEX) 600 MG 12 hr tablet Take 2 tablets (1,200 mg total) by mouth 2 (two) times daily as needed.  ? metoprolol succinate (TOPROL XL) 25 MG 24 hr tablet Take 1 tablet (25 mg total) by mouth daily.  ? naproxen (NAPROSYN) 500 MG tablet TAKE 1 TABLET(500 MG) BY MOUTH TWICE DAILY WITH A MEAL  ? rosuvastatin (CRESTOR) 20 MG tablet TAKE 1 TABLET(20 MG) BY MOUTH DAILY  ? sacubitril-valsartan (ENTRESTO) 49-51 MG Take 1 tablet by mouth 2 (two) times daily.  ? ?Current Facility-Administered Medications for the 04/25/21 encounter (Office Visit) with Deboraha Sprang, MD  ?Medication  ? sodium chloride flush (NS) 0.9 % injection 3 mL  ? ? ?Allergies  ?Allergen Reactions  ? Codeine Other (See Comments)  ?  Confusion  ? ? ? ? ?Review of Systems negative except from HPI and PMH ? ?Physical Exam ?BP 136/72   Pulse (!) 47   Ht 6' (1.829 m)  Wt 194 lb (88 kg)   SpO2 98%   BMI 26.31 kg/m?  ?Well developed and nourished in no acute distress ?HENT normal ?Neck supple with JVP-  flat which is ?Clear ?Regular rate and rhythm, no murmurs or gallops ?Abd-soft with active BS ?No Clubbing cyanosis edema ?Skin-warm and dry ?A & Oriented  Grossly normal sensory and motor function ? ?ECG SINUS @ 47 ?19/010/47 prwp ? ANT MI   No PVCs ?Unchanged back to 9/21 ?  ? ? ?Assessment and  Plan ? ?Ventricular tachycardia Nonsustained/PVCs ? ?Dronaderone therapy  ?  ?Cardiomyopathy?  Left ventricular noncompaction  ? ?Bradycardia-sinus  ? ? ? ?PVCs/palpitations quiescient.  Tolerating dronaderone.  We will continue 40 mg twice daily. ?  ?Interval improvement in cardiomyopathy.  Continue the Entresto 49/51 metoprolol 25; there  may be a role for the interval addition of spironolactone and an SGLT2. ? ? ?Blood pressure well controlled but there is added benefit to be gained by switching his amlodipine to spironolactone so we will do that first and decrease his amlodipine from 10--5 and will need to check a metabolic profile in 2 weeks.I have called him to review ? ?

## 2021-04-25 NOTE — Patient Instructions (Signed)
Medication Instructions:  ?Your physician recommends that you continue on your current medications as directed. Please refer to the Current Medication list given to you today. ? ?*If you need a refill on your cardiac medications before your next appointment, please call your pharmacy* ? ? ?Lab Work: ?None ordered ? ? ?Testing/Procedures: ?None ordered ? ? ?Follow-Up: ?At [facility], you and your health needs are our priority.  As part of our continuing mission to provide you with exceptional heart care, we have created designated Provider Care Teams.  These Care Teams include your primary Cardiologist (physician) and Advanced Practice Providers (APPs -  Physician Assistants and Nurse Practitioners) who all work together to provide you with the care you need, when you need it. ? ?Your next appointment:   ?1 year(s) ? ?The format for your next appointment:   ?In Person ? ?Provider:   ?Steven Klein, MD ? ? ? ?Thank you for choosing [facility]!! ? ? ?[phone] ? ?

## 2021-04-26 ENCOUNTER — Other Ambulatory Visit: Payer: Self-pay | Admitting: Internal Medicine

## 2021-04-26 DIAGNOSIS — R739 Hyperglycemia, unspecified: Secondary | ICD-10-CM

## 2021-04-26 DIAGNOSIS — E7800 Pure hypercholesterolemia, unspecified: Secondary | ICD-10-CM

## 2021-04-26 DIAGNOSIS — Z0000 Encounter for general adult medical examination without abnormal findings: Secondary | ICD-10-CM

## 2021-04-26 DIAGNOSIS — E559 Vitamin D deficiency, unspecified: Secondary | ICD-10-CM

## 2021-04-26 DIAGNOSIS — E538 Deficiency of other specified B group vitamins: Secondary | ICD-10-CM

## 2021-04-27 ENCOUNTER — Other Ambulatory Visit: Payer: Self-pay | Admitting: Internal Medicine

## 2021-04-29 ENCOUNTER — Encounter: Payer: Self-pay | Admitting: Internal Medicine

## 2021-04-29 DIAGNOSIS — Z79899 Other long term (current) drug therapy: Secondary | ICD-10-CM

## 2021-04-29 DIAGNOSIS — I10 Essential (primary) hypertension: Secondary | ICD-10-CM

## 2021-04-29 MED ORDER — SPIRONOLACTONE 25 MG PO TABS: 25.0000 mg | ORAL_TABLET | Freq: Every day | ORAL | 3 refills | Status: DC

## 2021-04-29 NOTE — Addendum Note (Signed)
Addended by: Thora Lance on: 04/29/2021 05:51 PM ? ? Modules accepted: Orders ? ?

## 2021-05-02 NOTE — Addendum Note (Signed)
Addended by: Burnetta Sabin on: 05/02/2021 01:34 PM ? ? Modules accepted: Orders ? ?

## 2021-05-13 ENCOUNTER — Other Ambulatory Visit: Payer: 59

## 2021-05-13 DIAGNOSIS — I10 Essential (primary) hypertension: Secondary | ICD-10-CM

## 2021-05-13 DIAGNOSIS — Z79899 Other long term (current) drug therapy: Secondary | ICD-10-CM

## 2021-05-13 LAB — BASIC METABOLIC PANEL
BUN/Creatinine Ratio: 20 (ref 10–24)
BUN: 25 mg/dL (ref 8–27)
CO2: 22 mmol/L (ref 20–29)
Calcium: 9.8 mg/dL (ref 8.6–10.2)
Chloride: 103 mmol/L (ref 96–106)
Creatinine, Ser: 1.22 mg/dL (ref 0.76–1.27)
Glucose: 87 mg/dL (ref 70–99)
Potassium: 5.3 mmol/L — ABNORMAL HIGH (ref 3.5–5.2)
Sodium: 137 mmol/L (ref 134–144)
eGFR: 62 mL/min/{1.73_m2} (ref >59)

## 2021-05-14 ENCOUNTER — Telehealth: Payer: Self-pay

## 2021-05-14 DIAGNOSIS — E875 Hyperkalemia: Secondary | ICD-10-CM

## 2021-05-14 NOTE — Telephone Encounter (Signed)
-----   Message from Duke Salvia, MD sent at 05/14/2021  6:21 PM EDT ----- ?Please Inform Patient that K  is abnormal; please clarify that he is taking spironolactone 25 mg, if he is lets reduce it to 12.5 and repeat the metabolic profile in 2 weeks.  If he is taking 12.5, which is not what my note says, that we need to discontinue it altogether and have him resume his amlodipine ? ?Thanks ? ?

## 2021-05-14 NOTE — Addendum Note (Signed)
Addended by: Alois Cliche on: 05/14/2021 06:29 PM ? ? Modules accepted: Orders ? ?

## 2021-05-14 NOTE — Telephone Encounter (Signed)
Spoke with pt and advised of elevated K+.  Pt confirms he is taking Spironolactone 25mg  - 1 tablet by mouth daily.  Pt advised per Dr Caryl Comes to decrease to 1/2 tablet (12.5mg ) daily and will recheck lab in 2 weeks.  Appointment scheduled for 05/28/2021. Pt verbalizes understanding and agrees with current plan. ?

## 2021-05-28 ENCOUNTER — Other Ambulatory Visit: Payer: 59 | Admitting: *Deleted

## 2021-05-28 DIAGNOSIS — E875 Hyperkalemia: Secondary | ICD-10-CM

## 2021-05-28 LAB — BASIC METABOLIC PANEL
BUN/Creatinine Ratio: 19 (ref 10–24)
BUN: 24 mg/dL (ref 8–27)
CO2: 22 mmol/L (ref 20–29)
Calcium: 9.2 mg/dL (ref 8.6–10.2)
Chloride: 106 mmol/L (ref 96–106)
Creatinine, Ser: 1.29 mg/dL — ABNORMAL HIGH (ref 0.76–1.27)
Glucose: 110 mg/dL — ABNORMAL HIGH (ref 70–99)
Potassium: 4.6 mmol/L (ref 3.5–5.2)
Sodium: 139 mmol/L (ref 134–144)
eGFR: 58 mL/min/{1.73_m2} — ABNORMAL LOW (ref >59)

## 2021-06-03 ENCOUNTER — Encounter: Payer: Self-pay | Admitting: Physical Therapy

## 2021-06-03 NOTE — Therapy (Signed)
[facility] ?[address] ?[address] ?[place], [place], [address] ?Phone: [phone]   Fax:  [phone] ? ?Patient Details  ?Name: Thomas Caldwell ?MRN: [id number] ?Date of Birth: Aug 30, 1945 ?Referring Provider:  No ref. provider found ? ?Encounter Date: 06/03/2021 ? ?PHYSICAL THERAPY DISCHARGE SUMMARY ? ?Visits from Start of Care: 5 ? ?Current functional level related to goals / functional outcomes: ?Did not return since last scheduled session, DC per clinic policy  ?  ?Remaining deficits: ?Unable to assess  ?  ?Education / Equipment: ?N/a   ? ?Patient agrees to discharge. Patient goals were partially met. Patient is being discharged due to not returning since the last visit. ? ? ?Yaneliz Radebaugh U PT, DPT, PN2  ? ?Supplemental Physical Therapist ?[facility]  ? ? ?.  ? ?[facility] ?[address] ?[address] ?[place], [place], [address] ?Phone: [phone]   Fax:  [phone] ?

## 2021-06-25 ENCOUNTER — Encounter: Payer: Self-pay | Admitting: Internal Medicine

## 2021-06-25 ENCOUNTER — Ambulatory Visit (INDEPENDENT_AMBULATORY_CARE_PROVIDER_SITE_OTHER): Payer: 59 | Admitting: Internal Medicine

## 2021-06-25 VITALS — BP 110/60 | HR 65 | Ht 72.0 in | Wt 196.0 lb

## 2021-06-25 DIAGNOSIS — E559 Vitamin D deficiency, unspecified: Secondary | ICD-10-CM | POA: Diagnosis not present

## 2021-06-25 DIAGNOSIS — I10 Essential (primary) hypertension: Secondary | ICD-10-CM

## 2021-06-25 DIAGNOSIS — N62 Hypertrophy of breast: Secondary | ICD-10-CM | POA: Diagnosis not present

## 2021-06-25 NOTE — Assessment & Plan Note (Signed)
C/w new onset symptomatic, declines mammogram for now, most likely related to aldactone use, also refer endo for confirmation and management if any

## 2021-06-25 NOTE — Progress Notes (Signed)
Patient ID: Devlin Alkire, male   DOB: 1945-11-23, 76 y.o.   MRN: [id number]        Chief Complaint: follow up bilateral breast tender swelling       HPI:  Tres Parlette is a 76 y.o. male here with 3 wks onset bilateral breast tender swelling and sensitive nipple areas without d/c or bleeding or specific mass; overall mild to mod but constant, worse with hugs, better to lie on his back, nothing else makes better or worse.  No overlying skin change, redness or fever,  Pt denies chest pain, increased sob or doe, wheezing, orthopnea, PND, increased LE swelling, palpitations, dizziness or syncope.   Pt denies polydipsia, polyuria, or new focal neuro s/s.   Pt reports wife very concerned and has been reading about male breast ca on the internet  Has cards f/u appt in july       Wt Readings from Last 3 Encounters:  06/25/21 196 lb (88.9 kg)  04/25/21 194 lb (88 kg)  02/06/21 197 lb (89.4 kg)   BP Readings from Last 3 Encounters:  06/25/21 110/60  04/25/21 136/72  02/06/21 120/60         Past Medical History:  Diagnosis Date   ALLERGIC RHINITIS 01/11/2008   ANEURYSM 02/02/2007   brain   BRADYCARDIA, CHRONIC 10/06/2007   DIVERTICULOSIS, COLON 07/05/2007   EUSTACHIAN TUBE DYSFUNCTION 04/06/2009   GERD 07/05/2007   HYPERLIPIDEMIA 02/02/2007   HYPERTENSION 02/02/2007   Lumbago 01/11/2008   Peptic ulcer    PERSISTENT DISORDER INITIATING/MAINTAINING SLEEP 02/02/2007   Past Surgical History:  Procedure Laterality Date   APPENDECTOMY  1982   LEFT HEART CATH AND CORONARY ANGIOGRAPHY N/A 10/04/2019   Procedure: LEFT HEART CATH AND CORONARY ANGIOGRAPHY;  Surgeon: Lennette Bihari, MD;  Location: [facility];  Service: Cardiovascular;  Laterality: N/A;   LUMBAR DISC SURGERY  12/2005   Dr. Venetia Maxon   [facility]  [facility]   due to brain aneurysm s/p repair    reports that he has never smoked. He has never used smokeless tobacco. He reports current alcohol use. He reports that he does not use  drugs. family history includes Esophageal cancer in his brother; Heart disease in his father and sister; Hypertension in his brother and father. Allergies  Allergen Reactions   Codeine Other (See Comments)    Confusion   Current Outpatient Medications on File Prior to Visit  Medication Sig Dispense Refill   amLODipine (NORVASC) 10 MG tablet TAKE 1 TABLET(10 MG) BY MOUTH DAILY 90 tablet 2   aspirin 81 MG EC tablet Take 81 mg by mouth daily.     dronedarone (MULTAQ) 400 MG tablet Take 1 tablet (400 mg total) by mouth 2 (two) times daily with a meal. 60 tablet 11   guaiFENesin (MUCINEX) 600 MG 12 hr tablet Take 2 tablets (1,200 mg total) by mouth 2 (two) times daily as needed. 60 tablet 1   metoprolol succinate (TOPROL XL) 25 MG 24 hr tablet Take 1 tablet (25 mg total) by mouth daily. 90 tablet 1   naproxen (NAPROSYN) 500 MG tablet TAKE 1 TABLET(500 MG) BY MOUTH TWICE DAILY WITH A MEAL 60 tablet 2   rosuvastatin (CRESTOR) 20 MG tablet TAKE 1 TABLET(20 MG) BY MOUTH DAILY 90 tablet 2   sacubitril-valsartan (ENTRESTO) 49-51 MG Take 1 tablet by mouth 2 (two) times daily. 60 tablet 9   spironolactone (ALDACTONE) 25 MG tablet Take 1 tablet (25 mg total) by mouth daily.  90 tablet 3   Current Facility-Administered Medications on File Prior to Visit  Medication Dose Route Frequency Provider Last Rate Last Admin   sodium chloride flush (NS) 0.9 % injection 3 mL  3 mL Intravenous Q12H Deboraha Sprang, MD            ROS:  All others reviewed and negative.  Objective        PE:  BP 110/60 (BP Location: Left Arm, Patient Position: Sitting, Cuff Size: Large)   Pulse 65   Ht 6' (1.829 m)   Wt 196 lb (88.9 kg)   SpO2 96%   BMI 26.58 kg/m                 Constitutional: Pt appears in NAD               HENT: Head: NCAT.                Right Ear: External ear normal.                 Left Ear: External ear normal.                Eyes: . Pupils are equal, round, and reactive to light. Conjunctivae and  EOM are normal               Nose: without d/c or deformity               Neck: Neck supple. Gross normal ROM                Bilateral tender 3-4 cm male type breast enlargement without specific nipple swelling or d/c, no mass or skin change noted               Cardiovascular: Normal rate and regular rhythm.                 Pulmonary/Chest: Effort normal and breath sounds without rales or wheezing.                Abd:  Soft, NT, ND, + BS, no organomegaly               Neurological: Pt is alert. At baseline orientation, motor grossly intact               Skin: Skin is warm. No rashes, no other new lesions, LE edema - none               Psychiatric: Pt behavior is normal without agitation   Micro: none  Cardiac tracings I have personally interpreted today:  none  Pertinent Radiological findings (summarize): none   Lab Results  Component Value Date   WBC 6.3 11/26/2020   HGB 14.4 11/26/2020   HCT 44.0 11/26/2020   PLT 278.0 11/26/2020   GLUCOSE 110 (H) 05/28/2021   CHOL 150 11/26/2020   TRIG 146.0 11/26/2020   HDL 46.60 11/26/2020   LDLCALC 74 11/26/2020   ALT 33 11/26/2020   AST 32 11/26/2020   NA 139 05/28/2021   K 4.6 05/28/2021   CL 106 05/28/2021   CREATININE 1.29 (H) 05/28/2021   BUN 24 05/28/2021   CO2 22 05/28/2021   TSH 4.15 11/26/2020   PSA 1.52 04/25/2021   HGBA1C 5.5 04/25/2021   Assessment/Plan:  Asten Pelican is a 76 y.o. White or Caucasian [1] male with  has a past medical history of ALLERGIC RHINITIS (01/11/2008), ANEURYSM (02/02/2007), BRADYCARDIA, CHRONIC (10/06/2007), DIVERTICULOSIS, COLON (  07/05/2007), EUSTACHIAN TUBE DYSFUNCTION (04/06/2009), GERD (07/05/2007), HYPERLIPIDEMIA (02/02/2007), HYPERTENSION (02/02/2007), Lumbago (01/11/2008), Peptic ulcer, and PERSISTENT DISORDER INITIATING/MAINTAINING SLEEP (02/02/2007).  Gynecomastia C/w new onset symptomatic, declines mammogram for now, most likely related to aldactone use, also refer endo for confirmation and  management if any  Essential hypertension BP Readings from Last 3 Encounters:  06/25/21 110/60  04/25/21 136/72  02/06/21 120/60   Stable, pt to continue medical treatment aldactone 25, entresto 49-51, and norvasc 10   Vitamin D deficiency . Last vitamin D Lab Results  Component Value Date   VD25OH 37.48 04/25/2021   Low, reminded to take oral replacement 2000 u qd  Followup: Return if symptoms worsen or fail to improve.  [name], MD 06/25/2021 8:07 PM [facility]

## 2021-06-25 NOTE — Assessment & Plan Note (Signed)
.   Last vitamin D Lab Results  Component Value Date   VD25OH 37.48 04/25/2021   Low, reminded to take oral replacement 2000 u qd

## 2021-06-25 NOTE — Assessment & Plan Note (Addendum)
BP Readings from Last 3 Encounters:  06/25/21 110/60  04/25/21 136/72  02/06/21 120/60   Stable, pt to continue medical treatment aldactone 25, entresto 49-51, and norvasc 10

## 2021-06-25 NOTE — Patient Instructions (Signed)
The bilateral breast pain and swelling seems most likely to be due to the spironolactone  Please call if you change your mind about the mammogram, but I agree, cancer is very low on the potential cause list  You will be contacted regarding the referral for: Endocrinology  Please continue all other medications as before, and refills have been done if requested.  Please have the pharmacy call with any other refills you may need.  Please keep your appointments with your specialists as you may have planned - Cardiology in July

## 2021-07-09 ENCOUNTER — Encounter: Payer: Self-pay | Admitting: Internal Medicine

## 2021-07-09 ENCOUNTER — Ambulatory Visit (INDEPENDENT_AMBULATORY_CARE_PROVIDER_SITE_OTHER): Payer: 59 | Admitting: Internal Medicine

## 2021-07-09 VITALS — BP 118/62 | HR 63 | Temp 98.2°F | Ht 72.0 in | Wt 193.8 lb

## 2021-07-09 DIAGNOSIS — J019 Acute sinusitis, unspecified: Secondary | ICD-10-CM | POA: Diagnosis not present

## 2021-07-09 DIAGNOSIS — E559 Vitamin D deficiency, unspecified: Secondary | ICD-10-CM

## 2021-07-09 DIAGNOSIS — I10 Essential (primary) hypertension: Secondary | ICD-10-CM | POA: Diagnosis not present

## 2021-07-09 DIAGNOSIS — J309 Allergic rhinitis, unspecified: Secondary | ICD-10-CM | POA: Diagnosis not present

## 2021-07-09 MED ORDER — DOXYCYCLINE HYCLATE 100 MG PO TABS: 100.0000 mg | ORAL_TABLET | Freq: Two times a day (BID) | ORAL | 0 refills | Status: DC

## 2021-07-09 NOTE — Progress Notes (Signed)
Patient ID: Thomas Caldwell, male   DOB: 01-Apr-1945, 76 y.o.   MRN: [id number]        Chief Complaint: follow up sinus symptoms,, htn, low vit d       HPI:  Thomas Caldwell is a 76 y.o. male  Here with 2-3 days acute onset fever, facial pain, pressure, headache, general weakness and malaise, and greenish d/c, with mild ST and cough, but pt denies chest pain, wheezing, increased sob or doe, orthopnea, PND, increased LE swelling, palpitations, dizziness or syncope.  Does have several wks ongoing nasal allergy symptoms with clearish congestion, itch and sneezing, without fever, pain, ST, cough, swelling or wheezing.  Taking Vit d       Wt Readings from Last 3 Encounters:  07/09/21 193 lb 12.8 oz (87.9 kg)  06/25/21 196 lb (88.9 kg)  04/25/21 194 lb (88 kg)   BP Readings from Last 3 Encounters:  07/09/21 118/62  06/25/21 110/60  04/25/21 136/72         Past Medical History:  Diagnosis Date   ALLERGIC RHINITIS 01/11/2008   ANEURYSM 02/02/2007   brain   BRADYCARDIA, CHRONIC 10/06/2007   DIVERTICULOSIS, COLON 07/05/2007   EUSTACHIAN TUBE DYSFUNCTION 04/06/2009   GERD 07/05/2007   HYPERLIPIDEMIA 02/02/2007   HYPERTENSION 02/02/2007   Lumbago 01/11/2008   Peptic ulcer    PERSISTENT DISORDER INITIATING/MAINTAINING SLEEP 02/02/2007   Past Surgical History:  Procedure Laterality Date   APPENDECTOMY  1982   LEFT HEART CATH AND CORONARY ANGIOGRAPHY N/A 10/04/2019   Procedure: LEFT HEART CATH AND CORONARY ANGIOGRAPHY;  Surgeon: Lennette Bihari, MD;  Location: [facility];  Service: Cardiovascular;  Laterality: N/A;   LUMBAR DISC SURGERY  12/2005   Dr. Venetia Maxon   [facility] Left brain  1992   due to brain aneurysm s/p repair    reports that he has never smoked. He has never used smokeless tobacco. He reports current alcohol use. He reports that he does not use drugs. family history includes Esophageal cancer in his brother; Heart disease in his father and sister; Hypertension in his brother and  father. Allergies  Allergen Reactions   Codeine Other (See Comments)    Confusion   Current Outpatient Medications on File Prior to Visit  Medication Sig Dispense Refill   amLODipine (NORVASC) 10 MG tablet TAKE 1 TABLET(10 MG) BY MOUTH DAILY 90 tablet 2   aspirin 81 MG EC tablet Take 81 mg by mouth daily.     dronedarone (MULTAQ) 400 MG tablet Take 1 tablet (400 mg total) by mouth 2 (two) times daily with a meal. 60 tablet 11   guaiFENesin (MUCINEX) 600 MG 12 hr tablet Take 2 tablets (1,200 mg total) by mouth 2 (two) times daily as needed. 60 tablet 1   metoprolol succinate (TOPROL XL) 25 MG 24 hr tablet Take 1 tablet (25 mg total) by mouth daily. 90 tablet 1   naproxen (NAPROSYN) 500 MG tablet TAKE 1 TABLET(500 MG) BY MOUTH TWICE DAILY WITH A MEAL 60 tablet 2   rosuvastatin (CRESTOR) 20 MG tablet TAKE 1 TABLET(20 MG) BY MOUTH DAILY 90 tablet 2   sacubitril-valsartan (ENTRESTO) 49-51 MG Take 1 tablet by mouth 2 (two) times daily. 60 tablet 9   spironolactone (ALDACTONE) 25 MG tablet Take 1 tablet (25 mg total) by mouth daily. 90 tablet 3   Current Facility-Administered Medications on File Prior to Visit  Medication Dose Route Frequency Provider Last Rate Last Admin   sodium chloride flush (NS) 0.9 %  injection 3 mL  3 mL Intravenous Q12H Duke Salvia, MD            ROS:  All others reviewed and negative.  Objective        PE:  BP 118/62 (BP Location: Left Arm, Patient Position: Sitting, Cuff Size: Normal)   Pulse 63   Temp 98.2 F (36.8 C) (Oral)   Ht 6' (1.829 m)   Wt 193 lb 12.8 oz (87.9 kg)   SpO2 95%   BMI 26.28 kg/m                 Constitutional: Pt appears in NAD               HENT: Head: NCAT.                Right Ear: External ear normal.                 Left Ear: External ear normal.  Bilat tm's with mild erythema.  Max sinus areas mild tender.  Pharynx with mild erythema, no exudate               Eyes: . Pupils are equal, round, and reactive to light. Conjunctivae  and EOM are normal               Nose: without d/c or deformity               Neck: Neck supple. Gross normal ROM               Cardiovascular: Normal rate and regular rhythm.                 Pulmonary/Chest: Effort normal and breath sounds without rales or wheezing.                Abd:  Soft, NT, ND, + BS, no organomegaly               Neurological: Pt is alert. At baseline orientation, motor grossly intact               Skin: Skin is warm. No rashes, no other new lesions, LE edema - none               Psychiatric: Pt behavior is normal without agitation   Micro: none  Cardiac tracings I have personally interpreted today:  none  Pertinent Radiological findings (summarize): none   Lab Results  Component Value Date   WBC 6.3 11/26/2020   HGB 14.4 11/26/2020   HCT 44.0 11/26/2020   PLT 278.0 11/26/2020   GLUCOSE 110 (H) 05/28/2021   CHOL 150 11/26/2020   TRIG 146.0 11/26/2020   HDL 46.60 11/26/2020   LDLCALC 74 11/26/2020   ALT 33 11/26/2020   AST 32 11/26/2020   NA 139 05/28/2021   K 4.6 05/28/2021   CL 106 05/28/2021   CREATININE 1.29 (H) 05/28/2021   BUN 24 05/28/2021   CO2 22 05/28/2021   TSH 4.15 11/26/2020   PSA 1.52 04/25/2021   HGBA1C 5.5 04/25/2021   Assessment/Plan:  Thomas Caldwell is a 76 y.o. White or Caucasian [1] male with  has a past medical history of ALLERGIC RHINITIS (01/11/2008), ANEURYSM (02/02/2007), BRADYCARDIA, CHRONIC (10/06/2007), DIVERTICULOSIS, COLON (07/05/2007), EUSTACHIAN TUBE DYSFUNCTION (04/06/2009), GERD (07/05/2007), HYPERLIPIDEMIA (02/02/2007), HYPERTENSION (02/02/2007), Lumbago (01/11/2008), Peptic ulcer, and PERSISTENT DISORDER INITIATING/MAINTAINING SLEEP (02/02/2007).  Acute sinus infection Mild to mod, for antibx course doxycycline,  to f/u any worsening symptoms  or concerns  Allergic rhinitis Mild to mod, for otc allegra 180 and nasacort asd,  to f/u any worsening symptoms or concerns  Essential hypertension BP Readings from Last 3  Encounters:  07/09/21 118/62  06/25/21 110/60  04/25/21 136/72   Stable, pt to continue medical treatment norvasc 10 qd, toprol xl 25 qd, entresto 49-51 qd   Vitamin D deficiency Last vitamin D Lab Results  Component Value Date   VD25OH 37.48 04/25/2021   Low, to increase oral replacement to 2000 units qd  Followup: No follow-ups on file.  Thomas Barre, MD 07/12/2021 4:10 PM [facility]  Primary Care - [facility]

## 2021-07-09 NOTE — Patient Instructions (Signed)
Please take all new medication as prescribed - the antibiotic  Please take all new medication as recommended - the mucinex twice per day, allegra and nasacort OTC  Please continue all other medications as before, and refills have been done if requested.  Please have the pharmacy call with any other refills you may need.  Please keep your appointments with your specialists as you may have planned

## 2021-07-12 ENCOUNTER — Encounter: Payer: Self-pay | Admitting: Internal Medicine

## 2021-07-12 NOTE — Assessment & Plan Note (Signed)
Mild to mod, for antibx course - doxycycline,  to f/u any worsening symptoms or concerns 

## 2021-07-12 NOTE — Assessment & Plan Note (Signed)
Mild to mod, for otc allegra 180 and nasacort asd,  to f/u any worsening symptoms or concerns

## 2021-07-12 NOTE — Assessment & Plan Note (Addendum)
BP Readings from Last 3 Encounters:  07/09/21 118/62  06/25/21 110/60  04/25/21 136/72   Stable, pt to continue medical treatment norvasc 10 qd, toprol xl 25 qd, entresto 49-51 qd

## 2021-07-12 NOTE — Assessment & Plan Note (Signed)
Last vitamin D Lab Results  Component Value Date   VD25OH 37.48 04/25/2021   Low, to increase oral replacement to 2000 units qd

## 2021-07-17 MED ORDER — EPLERENONE 25 MG PO TABS: 12.5000 mg | ORAL_TABLET | Freq: Every day | ORAL | 3 refills | Status: DC

## 2021-07-21 ENCOUNTER — Ambulatory Visit: Payer: 59 | Admitting: Family Medicine

## 2021-07-21 ENCOUNTER — Encounter: Payer: Self-pay | Admitting: Family Medicine

## 2021-07-21 ENCOUNTER — Ambulatory Visit (INDEPENDENT_AMBULATORY_CARE_PROVIDER_SITE_OTHER): Payer: 59

## 2021-07-21 VITALS — BP 120/80 | HR 57 | Ht 72.0 in | Wt 192.4 lb

## 2021-07-21 DIAGNOSIS — M79652 Pain in left thigh: Secondary | ICD-10-CM | POA: Diagnosis not present

## 2021-07-21 DIAGNOSIS — M25561 Pain in right knee: Secondary | ICD-10-CM | POA: Diagnosis not present

## 2021-07-21 DIAGNOSIS — G8929 Other chronic pain: Secondary | ICD-10-CM

## 2021-07-21 DIAGNOSIS — M79651 Pain in right thigh: Secondary | ICD-10-CM

## 2021-07-21 DIAGNOSIS — M25562 Pain in left knee: Secondary | ICD-10-CM | POA: Diagnosis not present

## 2021-08-11 ENCOUNTER — Ambulatory Visit: Payer: 59 | Attending: Family Medicine

## 2021-08-18 ENCOUNTER — Encounter: Payer: Self-pay | Admitting: Internal Medicine

## 2021-08-19 NOTE — Progress Notes (Unsigned)
Cardiology Office Note Date:  08/19/2021  Patient ID:  Thomas Caldwell, Thomas Caldwell 03/10/1945, MRN [id number] PCP:  Corwin Levins, MD  Electrophysiologist: Dr. Graciela Husbands  ***refresh   Chief Complaint: *** 3 mo  History of Present Illness: Thomas Caldwell is a 76 y.o. male with history of NICM, ? Noncompaction, PVCs, HTN, HLD  He comes in today to be seen for Dr. Graciela Husbands, last seen by him 04/25/21, discussed prior consult by Dr. Lalla Brothers for consideration of PVC ablation though with more then one morphology and perhaps noncompaction elected AAD Amlodipine reduced and spironolactone started, discussed plan for further reduction/stop of the amlodipine and further advancement of GDMT w/add on SGLT2  Folllow up labs noted hyperkalemia and spironolactone reduced > developed breast tenderness saw his PMD who suspected the spironolactone, offered mammogram, pt declined  I don't see that we were made aware.  *** symptoms *** stop CCB > SGLt2 *** PVCs?, symptoms *** breast tenderness??? *** volume   AAD hx Flecainide failed with development of LBBB multaq  Past Medical History:  Diagnosis Date   ALLERGIC RHINITIS 01/11/2008   ANEURYSM 02/02/2007   brain   BRADYCARDIA, CHRONIC 10/06/2007   DIVERTICULOSIS, COLON 07/05/2007   EUSTACHIAN TUBE DYSFUNCTION 04/06/2009   GERD 07/05/2007   HYPERLIPIDEMIA 02/02/2007   HYPERTENSION 02/02/2007   Lumbago 01/11/2008   Peptic ulcer    PERSISTENT DISORDER INITIATING/MAINTAINING SLEEP 02/02/2007    Past Surgical History:  Procedure Laterality Date   APPENDECTOMY  1982   LEFT HEART CATH AND CORONARY ANGIOGRAPHY N/A 10/04/2019   Procedure: LEFT HEART CATH AND CORONARY ANGIOGRAPHY;  Surgeon: Lennette Bihari, MD;  Location: [facility];  Service: Cardiovascular;  Laterality: N/A;   LUMBAR DISC SURGERY  12/2005   Dr. Venetia Maxon   [facility] Left brain  1992   due to brain aneurysm s/p repair    Current Outpatient Medications  Medication Sig Dispense Refill   amLODipine  (NORVASC) 10 MG tablet TAKE 1 TABLET(10 MG) BY MOUTH DAILY 90 tablet 2   aspirin 81 MG EC tablet Take 81 mg by mouth daily.     doxycycline (VIBRA-TABS) 100 MG tablet Take 1 tablet (100 mg total) by mouth 2 (two) times daily. 20 tablet 0   dronedarone (MULTAQ) 400 MG tablet Take 1 tablet (400 mg total) by mouth 2 (two) times daily with a meal. 60 tablet 11   eplerenone (INSPRA) 25 MG tablet Take 0.5 tablets (12.5 mg total) by mouth daily. 45 tablet 3   guaiFENesin (MUCINEX) 600 MG 12 hr tablet Take 2 tablets (1,200 mg total) by mouth 2 (two) times daily as needed. 60 tablet 1   metoprolol succinate (TOPROL XL) 25 MG 24 hr tablet Take 1 tablet (25 mg total) by mouth daily. 90 tablet 1   naproxen (NAPROSYN) 500 MG tablet TAKE 1 TABLET(500 MG) BY MOUTH TWICE DAILY WITH A MEAL 60 tablet 2   rosuvastatin (CRESTOR) 20 MG tablet TAKE 1 TABLET(20 MG) BY MOUTH DAILY 90 tablet 2   sacubitril-valsartan (ENTRESTO) 49-51 MG Take 1 tablet by mouth 2 (two) times daily. 60 tablet 9   Current Facility-Administered Medications  Medication Dose Route Frequency Provider Last Rate Last Admin   sodium chloride flush (NS) 0.9 % injection 3 mL  3 mL Intravenous Q12H Duke Salvia, MD        Allergies:   Codeine   Social History:  The patient  reports that he has never smoked. He has never used smokeless tobacco. He reports  current alcohol use. He reports that he does not use drugs.   Family History:  The patient's family history includes Esophageal cancer in his brother; Heart disease in his father and sister; Hypertension in his brother and father.***  ROS:  Please see the history of present illness.    All other systems are reviewed and otherwise negative.   PHYSICAL EXAM:  VS:  There were no vitals taken for this visit. BMI: There is no height or weight on file to calculate BMI. Well nourished, well developed, in no acute distress HEENT: normocephalic, atraumatic Neck: no JVD, carotid bruits or  masses Cardiac:  *** RRR; no significant murmurs, no rubs, or gallops Lungs:  *** CTA b/l, no wheezing, rhonchi or rales Abd: soft, nontender MS: no deformity or *** atrophy Ext: *** no edema Skin: warm and dry, no rash Neuro:  No gross deficits appreciated Psych: euthymic mood, full affect  *** PPM/ICD site is stable, no tethering or discomfort   EKG:  Done today and reviewed by myself shows  ***  04/07/21: TTE 1. No mural thrombus. Left ventricular ejection fraction, by estimation,  is 40 to 45%. Left ventricular ejection fraction by 2D MOD biplane is 39.9  %. The left ventricle has mildly decreased function. The left ventricle  demonstrates global hypokinesis.  Left ventricular diastolic parameters are consistent with Grade I  diastolic dysfunction (impaired relaxation).   2. Right ventricular systolic function is normal. The right ventricular  size is normal. Tricuspid regurgitation signal is inadequate for assessing  PA pressure.   3. Left atrial size was moderately dilated.   4. The mitral valve is abnormal. Mild mitral valve regurgitation.   5. The aortic valve is tricuspid. Aortic valve regurgitation is not  visualized. Aortic valve sclerosis/calcification is present, without any  evidence of aortic stenosis.   6. The pulmonic valve was abnormal.   Comparison(s): Changes from prior study are noted. 01/01/2020: LVEF 30-35%.   05/01/20: monitor Patient had a min HR of 41 bpm, max HR of 176 bpm, and avg HR of 56 bpm. Predominant underlying rhythm was Sinus Rhythm.    34 Ventricular Tachycardia runs occurred, the run with the fastest interval lasting 4 beats with a max rate of 176 bpm, the longest lasting 7 beats with an avg rate of 112 bpm.    Isolated SVEs were rare (<1.0%), and no SVE Couplets or SVE Triplets were present. Isolated VEs were occasional (4.3%, 7840), VE Couplets were occasional (3.4%, 3091), and VE Triplets were rare (<1.0%, 191).  Ventricular Bigeminy and  Trigeminy were present.   11/13/19: c.MRI IMPRESSION: 1. Severely dilated left ventricle with normal wall thickness and severely decreased systolic function (LVEF = 34%). There is diffuse hypokinesis with septal dyssynchrony and no late gadolinium enhancement in the left ventricular myocardium. There are prominent trabeculations in the left ventricular myocardium with ratio of non-compacted to compacted myocardium 3.3: 1.   2. Normal right ventricular size, thickness and systolic function (LVEF = 55%). There are no regional wall motion abnormalities.   3.  Severely dilated left atrium and mildly dilated right atrium.   4. Normal size of the aortic root, ascending aorta. Mildly dilated pulmonary artery measuring 33 mm.   5.  Mild to moderate mitral and mild tricuspid regurgitation.   6.  Normal pericardium.  No pericardial effusion.   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 LVEF is severely decreased < 35%.   10/04/19: LHC Prox RCA lesion is 5%  stenosed. Mid LAD lesion is 60% stenosed. There is severe left ventricular systolic dysfunction. LV end diastolic pressure is normal. The left ventricular ejection fraction is less than 25% by visual estimate.   There is evidence for mild coronary calcification involving the proximal LAD and left circumflex coronary arteries.   The LAD has a stenosis on a bend in the vessel between the first small diagonal and next major diagonal vessel.  In most views this appears to be 40 to 50%.  However in the RAO and AP cranial views it appears at least 60 to possibly 70%.  There is brisk TIMI-3 flow.   Mild calcification in the left circumflex coronary artery without obstructive disease.   Mild irregularity in the proximal RCA which gives rise to a large PDA vessel distally.   Severe global LV dysfunction with EF estimate at 20 to 25% with global hypocontractility.  LVEDP 13 mm   RECOMMENDATION: I  discussed the catheterization findings with Dr. Hurman Horn.  The patient will undergo a 2D echo Doppler study today post catheterization in the short stay unit prior to going home.  He will follow up with Dr. Graciela Husbands for adjustments to his medical management and further recommendations   Recent Labs: 11/26/2020: ALT 33; Hemoglobin 14.4; Platelets 278.0; TSH 4.15 05/28/2021: BUN 24; Creatinine, Ser 1.29; Potassium 4.6; Sodium 139  11/26/2020: Cholesterol 150; HDL 46.60; LDL Cholesterol 74; Total CHOL/HDL Ratio 3; Triglycerides 146.0; VLDL 29.2   CrCl cannot be calculated (Patient's most recent lab result is older than the maximum 21 days allowed.).   Wt Readings from Last 3 Encounters:  07/21/21 192 lb 6.4 oz (87.3 kg)  07/09/21 193 lb 12.8 oz (87.9 kg)  06/25/21 196 lb (88.9 kg)     Other studies reviewed: Additional studies/records reviewed today include: summarized above  ASSESSMENT AND PLAN:  PVCs On multaq ***  NICM Last EF 40-45% this year ***  HTN ***  Disposition: F/u with ***  Current medicines are reviewed at length with the patient today.  The patient did not have any concerns regarding medicines.  Thomas Fredrickson, PA-C 08/19/2021 6:47 AM     [facility] [address] [place] [address] [phone] (office)  [phone] (fax)

## 2021-08-19 NOTE — Progress Notes (Unsigned)
   I, Thomas Caldwell, LAT, ATC, am serving as scribe for Dr. [name].  Thomas Caldwell is a 76 y.o. male who presents to [facility] today for f/u of B knee and thigh pain.  He was last seen by Dr. Denyse Caldwell on 07/21/21 w/ increase in B knee/thigh pain after a 30 hour flight and noted more severe pain in his groin w/ radiating pain into his B ant thighs to his knees.  He was referred to PT at [facility] and never completed any visits.  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 Today, pt reports  Diagnostic testing: R knee MRI- 09/28/20; R knee XR- 09/11/20 and 08/28/20; L knee MRI- 09/08/20; L knee XR- 07/10/20  Pertinent review of systems: ***  Relevant historical information: ***   Exam:  There were no vitals taken for this visit. General: Well Developed, well nourished, and in no acute distress.   MSK: ***    Lab and Radiology Results No results found for this or any previous visit (from the past 72 hour(s)). No results found.     Assessment and Plan: 76 y.o. male with ***   PDMP not reviewed this encounter. No orders of the defined types were placed in this encounter.  No orders of the defined types were placed in this encounter.    Discussed warning signs or symptoms. Please see discharge instructions. Patient expresses understanding.   ***

## 2021-08-20 ENCOUNTER — Ambulatory Visit: Payer: 59 | Admitting: Physician Assistant

## 2021-08-20 ENCOUNTER — Ambulatory Visit: Payer: Self-pay

## 2021-08-20 ENCOUNTER — Encounter: Payer: Self-pay | Admitting: Physician Assistant

## 2021-08-20 ENCOUNTER — Ambulatory Visit: Payer: 59 | Admitting: Family Medicine

## 2021-08-20 VITALS — BP 120/50 | HR 59 | Ht 72.0 in | Wt 194.4 lb

## 2021-08-20 VITALS — BP 112/72 | HR 58 | Ht 72.0 in | Wt 194.0 lb

## 2021-08-20 DIAGNOSIS — I493 Ventricular premature depolarization: Secondary | ICD-10-CM

## 2021-08-20 DIAGNOSIS — M25562 Pain in left knee: Secondary | ICD-10-CM

## 2021-08-20 DIAGNOSIS — M25551 Pain in right hip: Secondary | ICD-10-CM | POA: Diagnosis not present

## 2021-08-20 DIAGNOSIS — M25561 Pain in right knee: Secondary | ICD-10-CM

## 2021-08-20 DIAGNOSIS — I10 Essential (primary) hypertension: Secondary | ICD-10-CM

## 2021-08-20 DIAGNOSIS — I428 Other cardiomyopathies: Secondary | ICD-10-CM

## 2021-08-20 DIAGNOSIS — M79652 Pain in left thigh: Secondary | ICD-10-CM

## 2021-08-20 DIAGNOSIS — M25552 Pain in left hip: Secondary | ICD-10-CM

## 2021-08-20 DIAGNOSIS — M79651 Pain in right thigh: Secondary | ICD-10-CM | POA: Diagnosis not present

## 2021-08-20 DIAGNOSIS — G8929 Other chronic pain: Secondary | ICD-10-CM

## 2021-08-20 LAB — BASIC METABOLIC PANEL
BUN/Creatinine Ratio: 16 (ref 10–24)
BUN: 19 mg/dL (ref 8–27)
CO2: 21 mmol/L (ref 20–29)
Calcium: 9.7 mg/dL (ref 8.6–10.2)
Chloride: 103 mmol/L (ref 96–106)
Creatinine, Ser: 1.21 mg/dL (ref 0.76–1.27)
Glucose: 93 mg/dL (ref 70–99)
Potassium: 4.6 mmol/L (ref 3.5–5.2)
Sodium: 139 mmol/L (ref 134–144)
eGFR: 62 mL/min/{1.73_m2} (ref >59)

## 2021-08-20 MED ORDER — SACUBITRIL-VALSARTAN 97-103 MG PO TABS: 1.0000 | ORAL_TABLET | Freq: Two times a day (BID) | ORAL | Status: DC

## 2021-08-20 NOTE — Patient Instructions (Addendum)
Thank you for coming in today.   You received steroid injections in both of your hips today. Seek immediate medical attention if the joint becomes red, extremely painful, or is oozing fluid.   Let me know how it goes. Keep me updated.

## 2021-08-20 NOTE — Patient Instructions (Addendum)
Medication Instructions:  Your physician has recommended you make the following change in your medication:    STOP TAKING: Amlodipine 10 Mg-  AND  2.   STOP TAKING: Eplerenone 12.5 Mg  3. START TAKING: Entresto 97/103 MG-  Take one tablet by mouth TWICE daily.     Labwork: You will have lab work drawn today: BMET  Testing/Procedures: None ordered.  Follow-Up: Your physician wants you to follow-up in: ONE MONTH with Dr. Izora Ribas;  NEW Patient appointment;  REPEAT BMET with this appointment.     Any Other Special Instructions Will Be Listed Below (If Applicable).  If you need a refill on your cardiac medications before your next appointment, please call your pharmacy.   Important Information About Sugar

## 2021-08-21 ENCOUNTER — Other Ambulatory Visit: Payer: Self-pay

## 2021-08-21 ENCOUNTER — Encounter: Payer: Self-pay | Admitting: Family Medicine

## 2021-08-21 DIAGNOSIS — Z79899 Other long term (current) drug therapy: Secondary | ICD-10-CM

## 2021-08-21 MED ORDER — SACUBITRIL-VALSARTAN 97-103 MG PO TABS: 1.0000 | ORAL_TABLET | Freq: Two times a day (BID) | ORAL | 3 refills | Status: DC

## 2021-09-01 ENCOUNTER — Other Ambulatory Visit: Payer: 59

## 2021-09-01 DIAGNOSIS — Z79899 Other long term (current) drug therapy: Secondary | ICD-10-CM

## 2021-09-01 LAB — BASIC METABOLIC PANEL
BUN/Creatinine Ratio: 17 (ref 10–24)
BUN: 21 mg/dL (ref 8–27)
CO2: 22 mmol/L (ref 20–29)
Calcium: 9.1 mg/dL (ref 8.6–10.2)
Chloride: 104 mmol/L (ref 96–106)
Creatinine, Ser: 1.24 mg/dL (ref 0.76–1.27)
Glucose: 99 mg/dL (ref 70–99)
Potassium: 4.6 mmol/L (ref 3.5–5.2)
Sodium: 136 mmol/L (ref 134–144)
eGFR: 61 mL/min/{1.73_m2} (ref >59)

## 2021-09-19 ENCOUNTER — Other Ambulatory Visit: Payer: Self-pay | Admitting: *Deleted

## 2021-09-19 MED ORDER — METOPROLOL SUCCINATE ER 25 MG PO TB24: 25.0000 mg | ORAL_TABLET | Freq: Every day | ORAL | 3 refills | Status: DC

## 2021-09-23 NOTE — Progress Notes (Unsigned)
Cardiology Office Note:    Date:  09/24/2021   ID:  Thomas Caldwell, DOB 1945-02-02, MRN [id number]  PCP:  Thomas Levins, MD   [facility] Providers Cardiologist:  None Electrophysiologist:  Thomas Manges, MD     Referring MD: Thomas Levins, MD   CC: Heart failure Consulted for the evaluation of Heart Failure at the behest of Thomas Gell PA  History of Present Illness:    Thomas Caldwell is a 76 y.o. male with a hx of heart failure with polmorphic PVCs on Multaq, HF with spironolactone hyperkalemia, eplerenone breast tenderness.    Patient notes that he is doing great.   Since last EP visit notes that he got back from [place] (still works) There are no interval hospital/ED visit.   Still play golf and has no symptoms.  No chest pain or pressure .  No SOB/DOE and no PND/Orthopnea.  No weight gain or leg swelling.  No palpitations or syncope.    Past Medical History:  Diagnosis Date   ALLERGIC RHINITIS 01/11/2008   ANEURYSM 02/02/2007   brain   BRADYCARDIA, CHRONIC 10/06/2007   DIVERTICULOSIS, COLON 07/05/2007   EUSTACHIAN TUBE DYSFUNCTION 04/06/2009   GERD 07/05/2007   HYPERLIPIDEMIA 02/02/2007   HYPERTENSION 02/02/2007   Lumbago 01/11/2008   Peptic ulcer    PERSISTENT DISORDER INITIATING/MAINTAINING SLEEP 02/02/2007    Past Surgical History:  Procedure Laterality Date   APPENDECTOMY  1982   LEFT HEART CATH AND CORONARY ANGIOGRAPHY N/A 10/04/2019   Procedure: LEFT HEART CATH AND CORONARY ANGIOGRAPHY;  Surgeon: Thomas Bihari, MD;  Location: [facility] INVASIVE CV LAB;  Service: Cardiovascular;  Laterality: N/A;   LUMBAR DISC SURGERY  12/2005   Dr. Venetia Caldwell   [facility] Left brain  1992   due to brain aneurysm s/p repair    Current Medications: Current Meds  Medication Sig   aspirin 81 MG EC tablet Take 81 mg by mouth daily.   dronedarone (MULTAQ) 400 MG tablet Take 1 tablet (400 mg total) by mouth 2 (two) times daily with a meal.   guaiFENesin (MUCINEX) 600 MG 12 hr tablet  Take 2 tablets (1,200 mg total) by mouth 2 (two) times daily as needed.   metoprolol succinate (TOPROL XL) 25 MG 24 hr tablet Take 1 tablet (25 mg total) by mouth daily.   rosuvastatin (CRESTOR) 20 MG tablet TAKE 1 TABLET(20 MG) BY MOUTH DAILY   sacubitril-valsartan (ENTRESTO) 97-103 MG Take 1 tablet by mouth 2 (two) times daily.   Current Facility-Administered Medications for the 09/24/21 encounter (Office Visit) with Thomas Constant, MD  Medication   sodium chloride flush (NS) 0.9 % injection 3 mL     Allergies:   Codeine, Eplerenone, and Spironolactone   Social History   Socioeconomic History   Marital status: Married    Spouse name: Not on file   Number of children: 2   Years of education: Not on file   Highest education level: Not on file  Occupational History   Occupation: furniture    Employer: [employer]  Tobacco Use   Smoking status: Never   Smokeless tobacco: Never  Substance and Sexual Activity   Alcohol use: Yes    Comment: 0-1 per day   Drug use: No   Sexual activity: Yes    Birth control/protection: None  Other Topics Concern   Not on file  Social History Narrative   Not on file   Social Determinants of Health   Financial Resource Strain: Not  on file  Food Insecurity: Not on file  Transportation Needs: Not on file  Physical Activity: Not on file  Stress: Not on file  Social Connections: Not on file     Family History: The patient's family history includes Esophageal cancer in his brother; Heart disease in his father and sister; Hypertension in his brother and father. There is no history of Colon cancer, Rectal cancer, or Stomach cancer. No SCD or LVNC history  ROS:   Please see the history of present illness.     All other systems reviewed and are negative.  EKGs/Labs/Other Studies Reviewed:    The following studies were reviewed today:   LEFT HEART CATH AND CORONARY ANGIOGRAPHY 10/04/2019  Narrative  Prox RCA lesion is 5%  stenosed.  Mid LAD lesion is 60% stenosed.  There is severe left ventricular systolic dysfunction.  LV end diastolic pressure is normal.  The left ventricular ejection fraction is less than 25% by visual estimate.  There is evidence for mild coronary calcification involving the proximal LAD and left circumflex coronary arteries.  The LAD has a stenosis on a bend in the vessel between the first small diagonal and next major diagonal vessel.  In most views this appears to be 40 to 50%.  However in the RAO and AP cranial views it appears at least 60 to possibly 70%.  There is brisk TIMI-3 flow.  Mild calcification in the left circumflex coronary artery without obstructive disease.  Mild irregularity in the proximal RCA which gives rise to a large PDA vessel distally.  Severe global LV dysfunction with EF estimate at 20 to 25% with global hypocontractility.  LVEDP 13 mm  RECOMMENDATION: I discussed the catheterization findings with Dr. Hurman Caldwell.  The patient will undergo a 2D echo Doppler study today post catheterization in the short stay unit prior to going home.  He will follow up with Dr. Graciela Caldwell for adjustments to his medical management and further recommendations.     ECHO COMPLETE WITH IMAGING ENHANCING AGENT 04/07/2021  Narrative ECHOCARDIOGRAM REPORT    Patient Name:   Thomas Caldwell Date of Exam: 04/07/2021 Medical Rec #:  [id number]         Height:       72.0 in Accession #:    [id number]        Weight:       197.0 lb Date of Birth:  Mar 04, 1945         BSA:          2.117 m Patient Age:    76 years          BP:           120/60 mmHg Patient Gender: M                 HR:           55 bpm. Exam Location:  [address]  Procedure: 2D Echo, Cardiac Doppler, Color Doppler and Intracardiac Opacification Agent  Indications:    I47.2 Ventricular tachycardia; R00.2 Palpitations  History:        Patient has prior history of Echocardiogram examinations, most recent  01/01/2020. CHF; Risk Factors:Hypertension and Dyslipidemia. Sinus bradycardia. Hyperglycemia. Fatigue.  Sonographer:    Thomas Caldwell RCS Referring Phys: [id number] Thomas Caldwell  IMPRESSIONS   1. No mural thrombus. Left ventricular ejection fraction, by estimation, is 40 to 45%. Left ventricular ejection fraction by 2D MOD biplane is 39.9 %. The left ventricle has mildly decreased function.  The left ventricle demonstrates global hypokinesis. Left ventricular diastolic parameters are consistent with Grade I diastolic dysfunction (impaired relaxation). 2. Right ventricular systolic function is normal. The right ventricular size is normal. Tricuspid regurgitation signal is inadequate for assessing PA pressure. 3. Left atrial size was moderately dilated. 4. The mitral valve is abnormal. Mild mitral valve regurgitation. 5. The aortic valve is tricuspid. Aortic valve regurgitation is not visualized. Aortic valve sclerosis/calcification is present, without any evidence of aortic stenosis. 6. The pulmonic valve was abnormal.  Comparison(s): Changes from prior study are noted. 01/01/2020: LVEF 30-35%.  FINDINGS Left Ventricle: No mural thrombus. Left ventricular ejection fraction, by estimation, is 40 to 45%. Left ventricular ejection fraction by 2D MOD biplane is 39.9 %. The left ventricle has mildly decreased function. The left ventricle demonstrates global hypokinesis. Definity contrast agent was given IV to delineate the left ventricular endocardial borders. The left ventricular internal cavity size was normal in size. There is no left ventricular hypertrophy. Left ventricular diastolic parameters are consistent with Grade I diastolic dysfunction (impaired relaxation). Indeterminate filling pressures.  Right Ventricle: The right ventricular size is normal. No increase in right ventricular wall thickness. Right ventricular systolic function is normal. Tricuspid regurgitation signal is inadequate for  assessing PA pressure.  Left Atrium: Left atrial size was moderately dilated.  Right Atrium: Right atrial size was normal in size.  Pericardium: There is no evidence of pericardial effusion.  Mitral Valve: The mitral valve is abnormal. There is mild thickening of the anterior and posterior mitral valve leaflet(s). Mild mitral valve regurgitation.  Tricuspid Valve: The tricuspid valve is grossly normal. Tricuspid valve regurgitation is trivial.  Aortic Valve: The aortic valve is tricuspid. Aortic valve regurgitation is not visualized. Aortic valve sclerosis/calcification is present, without any evidence of aortic stenosis.  Pulmonic Valve: The pulmonic valve was abnormal. Pulmonic valve regurgitation is mild.  Aorta: The aortic root and ascending aorta are structurally normal, with no evidence of dilitation.  Venous: The inferior vena cava was not well visualized.  IAS/Shunts: No atrial level shunt detected by color flow Doppler.   LEFT VENTRICLE PLAX 2D                        Biplane EF (MOD) LVIDd:         6.00 cm         LV Biplane EF:   Left LVIDs:         4.70 cm                          ventricular LV PW:         0.90 cm                          ejection LV IVS:        0.80 cm                          fraction by LVOT diam:     2.30 cm                          2D MOD LV SV:         83  biplane is LV SV Index:   39                               39.9 %. LVOT Area:     4.15 cm Diastology LV e' medial:    5.19 cm/s LV Volumes (MOD)               LV E/e' medial:  12.1 LV vol d, MOD    184.6 ml      LV e' lateral:   9.81 cm/s A2C:                           LV E/e' lateral: 6.4 LV vol d, MOD    219.9 ml A4C: LV vol s, MOD    103.5 ml A2C: LV vol s, MOD    139.9 ml A4C: LV SV MOD A2C:   81.1 ml LV SV MOD A4C:   219.9 ml LV SV MOD BP:    81.4 ml  RIGHT VENTRICLE RV Basal diam:  3.40 cm RV S prime:     18.90 cm/s TAPSE (M-mode): 2.2  cm  LEFT ATRIUM             Index        RIGHT ATRIUM           Index LA diam:        4.80 cm 2.27 cm/m   RA Area:     16.20 cm LA Vol (A2C):   73.4 ml 34.67 ml/m  RA Volume:   40.80 ml  19.27 ml/m LA Vol (A4C):   93.8 ml 44.31 ml/m LA Biplane Vol: 84.6 ml 39.96 ml/m AORTIC VALVE LVOT Vmax:   68.30 cm/s LVOT Vmean:  45.100 cm/s LVOT VTI:    0.200 m  AORTA Ao Root diam: 3.20 cm Ao Asc diam:  2.80 cm  MITRAL VALVE MV Area (PHT): 1.69 cm    SHUNTS MV Decel Time: 448 msec    Systemic VTI:  0.20 m MV E velocity: 62.90 cm/s  Systemic Diam: 2.30 cm MV A velocity: 76.50 cm/s MV E/A ratio:  0.82  Zoila Shutter MD Electronically signed by Zoila Shutter MD Signature Date/Time: 04/07/2021/1:12:05 PM     CARDIAC TELEMETRY MONITORING-INTERPRETATION ONLY 10/26/2019  Narrative Indication:*ventricular tachycardia  Duration: 21 days  Findings multiple episodes of VT Nonsustained Recommendations CAth >> no  Obstructive disease cMRI diffuse gadolinium and concern for LVNC   LONG TERM MONITOR (3-7 DAYS) INTERPRETATION 05/01/2020  Narrative Patch Wear Time:  2 days and 22 hours (2022-03-28T07:03:41-0400 to 2022-03-31T05:57:48-0400)  Patient had a min HR of 41 bpm, max HR of 176 bpm, and avg HR of 56 bpm. Predominant underlying rhythm was Sinus Rhythm.  34 Ventricular Tachycardia runs occurred, the run with the fastest interval lasting 4 beats with a max rate of 176 bpm, the longest lasting 7 beats with an avg rate of 112 bpm.  Isolated SVEs were rare (<1.0%), and no SVE Couplets or SVE Triplets were present. Isolated VEs were occasional (4.3%, 7840), VE Couplets were occasional (3.4%, 3091), and VE Triplets were rare (<1.0%, 191). Ventricular Bigeminy and Trigeminy were present.      MR Card Morphology Wo/W Cm 11/13/2019  Narrative CLINICAL DATA:  76 year old male with h/o cardiomyopathy and episodes of nonsustained ventricular tachycardias. Cardiac catheterization  showed only mild non-obstructive CAD.  EXAM: CARDIAC MRI  TECHNIQUE: The patient was  scanned on a 1.5 Tesla GE magnet. A dedicated cardiac coil was used. Functional imaging was done using Fiesta sequences. 2,3, and 4 chamber views were done to assess for RWMA's. Modified Simpson's rule using a short axis stack was used to calculate an ejection fraction on a dedicated work [employer]. The patient received 9 cc of Gadavist. After 10 minutes inversion recovery sequences were used to assess for infiltration and scar tissue.  CONTRAST:  9 cc  of Gadavist  FINDINGS: 1. Severely dilated left ventricle with normal wall thickness and severely decreased systolic function (LVEF = 34%). There is diffuse hypokinesis with septal dyssynchrony and no late gadolinium enhancement in the left ventricular myocardium. There are prominent trabeculations in the left ventricular myocardium with ratio of non-compacted to compacted myocardium 3.3: 1.  LVEDD: 68 mm  LVESD: 56 mm  LVEDV: 262 ml  LVESV: 174 ml  SV: 88 ml  CO: 5.1 L/min  Myocardial mass: 141 g  2. Normal right ventricular size, thickness and systolic function (LVEF = 55%). There are no regional wall motion abnormalities.  3.  Severely dilated left atrium and mildly dilated right atrium.  4. Normal size of the aortic root, ascending aorta. Mildly dilated pulmonary artery measuring 33 mm.  5.  Mild to moderate mitral and mild tricuspid regurgitation.  6.  Normal pericardium.  No pericardial effusion.  IMPRESSION: 1. Severely dilated left ventricle with normal wall thickness and severely decreased systolic function (LVEF = 34%). There is diffuse hypokinesis with septal dyssynchrony and no late gadolinium enhancement in the left ventricular myocardium. There are prominent trabeculations in the left ventricular myocardium with ratio of non-compacted to compacted myocardium 3.3: 1.  2. Normal right  ventricular size, thickness and systolic function (LVEF = 55%). There are no regional wall motion abnormalities.  3.  Severely dilated left atrium and mildly dilated right atrium.  4. Normal size of the aortic root, ascending aorta. Mildly dilated pulmonary artery measuring 33 mm.  5.  Mild to moderate mitral and mild tricuspid regurgitation.  6.  Normal pericardium.  No pericardial effusion.  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 LVEF is severely decreased < 35%.   Electronically Signed By: Tobias Alexander On: 11/14/2019 12:06      Recent Labs: 11/26/2020: ALT 33; Hemoglobin 14.4; Platelets 278.0; TSH 4.15 09/01/2021: BUN 21; Creatinine, Ser 1.24; Potassium 4.6; Sodium 136  Recent Lipid Panel    Component Value Date/Time   CHOL 150 11/26/2020 1257   TRIG 146.0 11/26/2020 1257   HDL 46.60 11/26/2020 1257   CHOLHDL 3 11/26/2020 1257   VLDL 29.2 11/26/2020 1257   LDLCALC 74 11/26/2020 1257        Physical Exam:    VS:  BP 138/60   Pulse (!) 53   Ht 6' (1.829 m)   Wt 88 kg   SpO2 98%   BMI 26.31 kg/m     Wt Readings from Last 3 Encounters:  09/24/21 88 kg  08/20/21 88 kg  08/20/21 88.2 kg    Gen: No distress   Neck: No JVD Ears: no Homero Fellers Sign Cardiac: No Rubs or Gallops, no Murmur, regular brady, +2 radial pulses Respiratory: Clear to auscultation bilaterally, normal effort, normal  respiratory rate GI: Soft, nontender, non-distended  MS: No  edema;  moves all extremities Integument: Skin feels warm Neuro:  At time of evaluation, alert and oriented to person/place/time/situation  Psych: Normal affect, patient feels well  ASSESSMENT:  1. Left ventricular non-compaction cardiomyopathy (HCC)   2. PVC's (premature ventricular contractions)   3. Pure hypercholesterolemia    PLAN:     Query of left ventricular non-compaction  Heart Failure Reduced Ejection Fraction  - NYHA class NYHA, Stage A,  euvolemic - Diuretic regimen: Non - he had allergies to eplerenone and spironolactone (gynecomastia- painful) and LVEF 40% - will start Jardiance 10 mg, CBC and BMP in two weeks - max tolerate BB succinate 25 mg PO daily - high dose ARN - if he truly had left ventricular non compaction, will offer genetic testing for kids, would be reasonable for ICD , and we would have discussions about pros and cons of anticoagulation - I am unclear if he truly have this, will repeat CMR for Grothoff Criteria Assessment and for LV function   PVCs On Multaq per EP- no symptoms of HF despite decreased LVEF and with no NYHA IV symptoms  HDL - on Crestor 20 mg  LDL < 100  December with me       Medication Adjustments/Labs and Tests Ordered: Current medicines are reviewed at length with the patient today.  Concerns regarding medicines are outlined above.  No orders of the defined types were placed in this encounter.  No orders of the defined types were placed in this encounter.   Patient Instructions  Medication Instructions:  START GEN JARDIANCE 10 MG EVERY DAY  *If you need a refill on your cardiac medications before your next appointment, please call your pharmacy*   Lab Work: 2 WEEKS CBC AND BMET  If you have labs (blood work) drawn today and your tests are completely normal, you will receive your results only by: MyChart Message (if you have MyChart) OR A paper copy in the mail If you have any lab test that is abnormal or we need to change your treatment, we will call you to review the results.   Testing/Procedures: Your physician has requested that you have a cardiac MRI. Cardiac MRI uses a computer to create images of your heart as its beating, producing both still and moving pictures of your heart and major blood vessels. For further information please visit [email or web address]. Please follow the instruction sheet given to you today for more information.    Follow-Up: At [facility], you and your health needs are our priority.  As part of our continuing mission to provide you with exceptional heart care, we have created designated Provider Care Teams.  These Care Teams include your primary Cardiologist (physician) and Advanced Practice Providers (APPs -  Physician Assistants and Nurse Practitioners) who all work together to provide you with the care you need, when you need it.  We recommend signing up for the patient portal called "MyChart".  Sign up information is provided on this After Visit Summary.  MyChart is used to connect with patients for Virtual Visits (Telemedicine).  Patients are able to view lab/test results, encounter notes, upcoming appointments, etc.  Non-urgent messages can be sent to your provider as well.   To learn more about what you can do with MyChart, go to [email or web address].    Your next appointment:   4 month(s)  The format for your next appointment:   In Person  Provider:   DR Izora Ribas    Other Instructions NONE  Important Information About Sugar         Signed, Thomas Constant, MD  09/24/2021 8:53 AM    [facility]

## 2021-09-24 ENCOUNTER — Ambulatory Visit: Payer: 59

## 2021-09-24 ENCOUNTER — Encounter: Payer: Self-pay | Admitting: Internal Medicine

## 2021-09-24 ENCOUNTER — Ambulatory Visit: Payer: 59 | Attending: Internal Medicine | Admitting: Internal Medicine

## 2021-09-24 VITALS — BP 138/60 | HR 53 | Ht 72.0 in | Wt 194.0 lb

## 2021-09-24 DIAGNOSIS — I428 Other cardiomyopathies: Secondary | ICD-10-CM

## 2021-09-24 DIAGNOSIS — E7800 Pure hypercholesterolemia, unspecified: Secondary | ICD-10-CM

## 2021-09-24 DIAGNOSIS — Z01812 Encounter for preprocedural laboratory examination: Secondary | ICD-10-CM | POA: Diagnosis not present

## 2021-09-24 DIAGNOSIS — I493 Ventricular premature depolarization: Secondary | ICD-10-CM

## 2021-09-24 DIAGNOSIS — I10 Essential (primary) hypertension: Secondary | ICD-10-CM

## 2021-09-24 LAB — BASIC METABOLIC PANEL
BUN/Creatinine Ratio: 16 (ref 10–24)
BUN: 21 mg/dL (ref 8–27)
CO2: 23 mmol/L (ref 20–29)
Calcium: 9.8 mg/dL (ref 8.6–10.2)
Chloride: 106 mmol/L (ref 96–106)
Creatinine, Ser: 1.3 mg/dL — ABNORMAL HIGH (ref 0.76–1.27)
Glucose: 93 mg/dL (ref 70–99)
Potassium: 5.2 mmol/L (ref 3.5–5.2)
Sodium: 141 mmol/L (ref 134–144)
eGFR: 57 mL/min/{1.73_m2} — ABNORMAL LOW (ref >59)

## 2021-09-24 LAB — HEMOGLOBIN AND HEMATOCRIT, BLOOD
Hematocrit: 42.7 % (ref 37.5–51.0)
Hemoglobin: 14.5 g/dL (ref 13.0–17.7)

## 2021-09-24 MED ORDER — EMPAGLIFLOZIN 10 MG PO TABS: 10.0000 mg | ORAL_TABLET | Freq: Every day | ORAL | 3 refills | Status: DC

## 2021-09-24 NOTE — Patient Instructions (Addendum)
Medication Instructions:  START EMPAGLIFLOZIN 10 MG EVERY DAY  *If you need a refill on your cardiac medications before your next appointment, please call your pharmacy*   Lab Work: 2 WEEKS CBC AND BMET  If you have labs (blood work) drawn today and your tests are completely normal, you will receive your results only by: MyChart Message (if you have MyChart) OR A paper copy in the mail If you have any lab test that is abnormal or we need to change your treatment, we will call you to review the results.   Testing/Procedures: Your physician has requested that you have a cardiac MRI. Cardiac MRI uses a computer to create images of your heart as its beating, producing both still and moving pictures of your heart and major blood vessels. For further information please visit [email or web address]. Please follow the instruction sheet given to you today for more information.    Follow-Up: At [facility], you and your health needs are our priority.  As part of our continuing mission to provide you with exceptional heart care, we have created designated Provider Care Teams.  These Care Teams include your primary Cardiologist (physician) and Advanced Practice Providers (APPs -  Physician Assistants and Nurse Practitioners) who all work together to provide you with the care you need, when you need it.  We recommend signing up for the patient portal called "MyChart".  Sign up information is provided on this After Visit Summary.  MyChart is used to connect with patients for Virtual Visits (Telemedicine).  Patients are able to view lab/test results, encounter notes, upcoming appointments, etc.  Non-urgent messages can be sent to your provider as well.   To learn more about what you can do with MyChart, go to [email or web address].    Your next appointment:   4 month(s)  The format for your next appointment:   In Person  Provider:   DR Izora Ribas    Other Instructions NONE  Important  Information About Sugar

## 2021-10-08 ENCOUNTER — Ambulatory Visit (INDEPENDENT_AMBULATORY_CARE_PROVIDER_SITE_OTHER): Payer: 59 | Admitting: Internal Medicine

## 2021-10-08 ENCOUNTER — Ambulatory Visit: Payer: 59 | Attending: Internal Medicine

## 2021-10-08 ENCOUNTER — Encounter: Payer: Self-pay | Admitting: Internal Medicine

## 2021-10-08 VITALS — BP 116/64 | HR 58 | Temp 98.8°F | Ht 72.0 in | Wt 190.0 lb

## 2021-10-08 DIAGNOSIS — J019 Acute sinusitis, unspecified: Secondary | ICD-10-CM | POA: Diagnosis not present

## 2021-10-08 DIAGNOSIS — R739 Hyperglycemia, unspecified: Secondary | ICD-10-CM | POA: Diagnosis not present

## 2021-10-08 DIAGNOSIS — H6981 Other specified disorders of Eustachian tube, right ear: Secondary | ICD-10-CM | POA: Diagnosis not present

## 2021-10-08 DIAGNOSIS — I10 Essential (primary) hypertension: Secondary | ICD-10-CM | POA: Diagnosis not present

## 2021-10-08 DIAGNOSIS — I428 Other cardiomyopathies: Secondary | ICD-10-CM

## 2021-10-08 MED ORDER — CEFDINIR 300 MG PO CAPS: 300.0000 mg | ORAL_CAPSULE | Freq: Two times a day (BID) | ORAL | 0 refills | Status: DC

## 2021-10-08 MED ORDER — HYDROCODONE BIT-HOMATROP MBR 5-1.5 MG/5ML PO SOLN: 5.0000 mL | Freq: Four times a day (QID) | ORAL | 0 refills | Status: AC | PRN

## 2021-10-08 NOTE — Patient Instructions (Signed)
Please take all new medication as prescribed - the antibiotic, and cough medicine as needed  Please continue all other medications as before, and refills have been done if requested.  Please have the pharmacy call with any other refills you may need.  Please keep your appointments with your specialists as you may have planned   

## 2021-10-08 NOTE — Progress Notes (Signed)
Patient ID: Thomas Caldwell, male   DOB: [birth year], 76 y.o.   MRN: [id number]        Chief Complaint: follow up cough with right ear pain       HPI:  Thomas Caldwell is a 76 y.o. male here with above x 3 days,  Here with 2-3 days acute onset fever, facial pain, pressure, headache, general weakness and malaise, and greenish d/c, with mild ST and cough, but pt denies chest pain, wheezing, increased sob or doe, orthopnea, PND, increased LE swelling, palpitations, dizziness or syncope. .  Also with right ear pressure and fullness, somewhat muffled hearing.  Pt denies polydipsia, polyuria, or new focal neuro s/s.         Wt Readings from Last 3 Encounters:  10/08/21 190 lb (86.2 kg)  09/24/21 194 lb (88 kg)  08/20/21 194 lb (88 kg)   BP Readings from Last 3 Encounters:  10/08/21 116/64  09/24/21 138/60  08/20/21 112/72         Past Medical History:  Diagnosis Date   ALLERGIC RHINITIS 01/11/2008   ANEURYSM 02/02/2007   brain   BRADYCARDIA, CHRONIC 10/06/2007   DIVERTICULOSIS, COLON 07/05/2007   EUSTACHIAN TUBE DYSFUNCTION 04/06/2009   GERD 07/05/2007   HYPERLIPIDEMIA 02/02/2007   HYPERTENSION 02/02/2007   Lumbago 01/11/2008   Peptic ulcer    PERSISTENT DISORDER INITIATING/MAINTAINING SLEEP 02/02/2007   Past Surgical History:  Procedure Laterality Date   APPENDECTOMY  1982   LEFT HEART CATH AND CORONARY ANGIOGRAPHY N/A 10/04/2019   Procedure: LEFT HEART CATH AND CORONARY ANGIOGRAPHY;  Surgeon: Lennette Bihari, MD;  Location: [facility];  Service: Cardiovascular;  Laterality: N/A;   LUMBAR DISC SURGERY  12/2005   Dr. Venetia Maxon   [facility] Left brain  1992   due to brain aneurysm s/p repair    reports that he has never smoked. He has never used smokeless tobacco. He reports current alcohol use. He reports that he does not use drugs. family history includes Esophageal cancer in his brother; Heart disease in his father and sister; Hypertension in his brother and father. Allergies  Allergen  Reactions   Codeine Other (See Comments)    Confusion   Eplerenone Other (See Comments)    Breast tenderness   Spironolactone Other (See Comments)    Breast tenderness   Current Outpatient Medications on File Prior to Visit  Medication Sig Dispense Refill   aspirin 81 MG EC tablet Take 81 mg by mouth daily.     dronedarone (MULTAQ) 400 MG tablet Take 1 tablet (400 mg total) by mouth 2 (two) times daily with a meal. 60 tablet 11   empagliflozin (JARDIANCE) 10 MG TABS tablet Take 1 tablet (10 mg total) by mouth daily before breakfast. 90 tablet 3   guaiFENesin (MUCINEX) 600 MG 12 hr tablet Take 2 tablets (1,200 mg total) by mouth 2 (two) times daily as needed. 60 tablet 1   metoprolol succinate (TOPROL XL) 25 MG 24 hr tablet Take 1 tablet (25 mg total) by mouth daily. 90 tablet 3   rosuvastatin (CRESTOR) 20 MG tablet TAKE 1 TABLET(20 MG) BY MOUTH DAILY 90 tablet 2   sacubitril-valsartan (ENTRESTO) 97-103 MG Take 1 tablet by mouth 2 (two) times daily. 180 tablet 3   Current Facility-Administered Medications on File Prior to Visit  Medication Dose Route Frequency Provider Last Rate Last Admin   sodium chloride flush (NS) 0.9 % injection 3 mL  3 mL Intravenous Q12H Duke Salvia, MD  ROS:  All others reviewed and negative.  Objective        PE:  BP 116/64   Pulse (!) 58   Temp 98.8 F (37.1 C)   Ht 6' (1.829 m)   Wt 190 lb (86.2 kg)   SpO2 95%   BMI 25.77 kg/m                 Constitutional: Pt appears in NAD, mild ill               HENT: Head: NCAT.                Right Ear: External ear normal.  Right TM with mild erythema and post fluid               Left Ear: External ear normal.  Bilat tm's with mild erythema.  Max sinus areas mild tender.  Pharynx with mild erythema, no exudate               Eyes: . Pupils are equal, round, and reactive to light. Conjunctivae and EOM are normal               Nose: without d/c or deformity               Neck: Neck supple. Gross  normal ROM               Cardiovascular: Normal rate and regular rhythm.                 Pulmonary/Chest: Effort normal and breath sounds without rales or wheezing.                               Neurological: Pt is alert. At baseline orientation, motor grossly intact               Skin: Skin is warm. No rashes, no other new lesions, LE edema - none               Psychiatric: Pt behavior is normal without agitation   Micro: none  Cardiac tracings I have personally interpreted today:  none  Pertinent Radiological findings (summarize): none   Lab Results  Component Value Date   WBC 7.7 10/08/2021   HGB 14.4 10/08/2021   HCT 44.2 10/08/2021   PLT 256 10/08/2021   GLUCOSE 90 10/08/2021   CHOL 150 11/26/2020   TRIG 146.0 11/26/2020   HDL 46.60 11/26/2020   LDLCALC 74 11/26/2020   ALT 33 11/26/2020   AST 32 11/26/2020   NA 142 10/08/2021   K 4.6 10/08/2021   CL 106 10/08/2021   CREATININE 1.46 (H) 10/08/2021   BUN 23 10/08/2021   CO2 22 10/08/2021   TSH 4.15 11/26/2020   PSA 1.52 04/25/2021   HGBA1C 5.5 04/25/2021   Assessment/Plan:  Thomas Caldwell is a 76 y.o. White or Caucasian [1] male with  has a past medical history of ALLERGIC RHINITIS (01/11/2008), ANEURYSM (02/02/2007), BRADYCARDIA, CHRONIC (10/06/2007), DIVERTICULOSIS, COLON (07/05/2007), EUSTACHIAN TUBE DYSFUNCTION (04/06/2009), GERD (07/05/2007), HYPERLIPIDEMIA (02/02/2007), HYPERTENSION (02/02/2007), Lumbago (01/11/2008), Peptic ulcer, and PERSISTENT DISORDER INITIATING/MAINTAINING SLEEP (02/02/2007).  Acute sinus infection Mild to mod, for antibx course,  to f/u any worsening symptoms or concerns  Eustachian tube dysfunction, right Also for mucinex bid prn  Essential hypertension BP Readings from Last 3 Encounters:  10/08/21 116/64  09/24/21 138/60  08/20/21 112/72   Stable, pt to continue  medical treatment toprol xl 25 qd, entresto 97-103   Hyperglycemia Lab Results  Component Value Date   HGBA1C 5.5 04/25/2021    Stable, pt to continue current medical treatment  - diet, wt control, excercise  Followup: Return if symptoms worsen or fail to improve.  Oliver Barre, MD 10/11/2021 7:23 PM [facility]

## 2021-10-09 ENCOUNTER — Encounter: Payer: Self-pay | Admitting: Internal Medicine

## 2021-10-09 LAB — BASIC METABOLIC PANEL
BUN/Creatinine Ratio: 16 (ref 10–24)
BUN: 23 mg/dL (ref 8–27)
CO2: 22 mmol/L (ref 20–29)
Calcium: 9.4 mg/dL (ref 8.6–10.2)
Chloride: 106 mmol/L (ref 96–106)
Creatinine, Ser: 1.46 mg/dL — ABNORMAL HIGH (ref 0.76–1.27)
Glucose: 90 mg/dL (ref 70–99)
Potassium: 4.6 mmol/L (ref 3.5–5.2)
Sodium: 142 mmol/L (ref 134–144)
eGFR: 50 mL/min/{1.73_m2} — ABNORMAL LOW (ref >59)

## 2021-10-09 LAB — CBC
Hematocrit: 44.2 % (ref 37.5–51.0)
Hemoglobin: 14.4 g/dL (ref 13.0–17.7)
MCH: 28.9 pg (ref 26.6–33.0)
MCHC: 32.6 g/dL (ref 31.5–35.7)
MCV: 89 fL (ref 79–97)
Platelets: 256 10*3/uL (ref 150–450)
RBC: 4.98 x10E6/uL (ref 4.14–5.80)
RDW: 13.5 % (ref 11.6–15.4)
WBC: 7.7 10*3/uL (ref 3.4–10.8)

## 2021-10-11 ENCOUNTER — Encounter: Payer: Self-pay | Admitting: Internal Medicine

## 2021-10-11 NOTE — Assessment & Plan Note (Signed)
Lab Results  Component Value Date   HGBA1C 5.5 04/25/2021   Stable, pt to continue current medical treatment  - diet, wt control, excercise

## 2021-10-11 NOTE — Assessment & Plan Note (Signed)
BP Readings from Last 3 Encounters:  10/08/21 116/64  09/24/21 138/60  08/20/21 112/72   Stable, pt to continue medical treatment toprol xl 25 qd, entresto 97-103

## 2021-10-11 NOTE — Assessment & Plan Note (Signed)
Also for mucinex bid prn 

## 2021-10-11 NOTE — Assessment & Plan Note (Signed)
Mild to mod, for antibx course,  to f/u any worsening symptoms or concerns 

## 2021-10-15 ENCOUNTER — Telehealth: Payer: Self-pay

## 2021-10-15 DIAGNOSIS — I10 Essential (primary) hypertension: Secondary | ICD-10-CM

## 2021-10-15 DIAGNOSIS — Z79899 Other long term (current) drug therapy: Secondary | ICD-10-CM

## 2021-10-15 NOTE — Telephone Encounter (Signed)
The patient has been notified of the result and verbalized understanding.  All questions (if any) were answered. Precious Gilding, RN 10/15/2021 3:28 PM   Pt request to have repeat labs prior to Nov 1. 2023.  Scheduled for 10/29/21 also advised to ensure consuming adequate amount of fluids at least 64 oz.

## 2021-10-15 NOTE — Telephone Encounter (Signed)
-----   Message from Werner Lean, MD sent at 10/12/2021  3:03 PM EDT ----- Results: Seen by PCP with acute illness Creatinine increased a bit Plan: Recheck BMP at 11/26/21 visit or with PCP  Werner Lean, MD

## 2021-10-29 ENCOUNTER — Ambulatory Visit: Payer: 59 | Attending: Internal Medicine

## 2021-10-29 DIAGNOSIS — Z79899 Other long term (current) drug therapy: Secondary | ICD-10-CM

## 2021-10-29 DIAGNOSIS — I10 Essential (primary) hypertension: Secondary | ICD-10-CM

## 2021-10-29 LAB — BASIC METABOLIC PANEL
BUN/Creatinine Ratio: 15 (ref 10–24)
BUN: 21 mg/dL (ref 8–27)
CO2: 23 mmol/L (ref 20–29)
Calcium: 10.1 mg/dL (ref 8.6–10.2)
Chloride: 103 mmol/L (ref 96–106)
Creatinine, Ser: 1.41 mg/dL — ABNORMAL HIGH (ref 0.76–1.27)
Glucose: 94 mg/dL (ref 70–99)
Potassium: 5 mmol/L (ref 3.5–5.2)
Sodium: 139 mmol/L (ref 134–144)
eGFR: 52 mL/min/{1.73_m2} — ABNORMAL LOW (ref >59)

## 2021-11-12 ENCOUNTER — Encounter: Payer: Self-pay | Admitting: Internal Medicine

## 2021-11-13 ENCOUNTER — Other Ambulatory Visit: Payer: Self-pay | Admitting: Internal Medicine

## 2021-11-26 ENCOUNTER — Ambulatory Visit: Payer: 59 | Admitting: Internal Medicine

## 2021-12-08 ENCOUNTER — Telehealth: Payer: Self-pay | Admitting: Internal Medicine

## 2021-12-08 NOTE — Telephone Encounter (Signed)
Pt requesting to go do labwork tomorrow for the CPE 12/10/21.

## 2021-12-09 ENCOUNTER — Other Ambulatory Visit (INDEPENDENT_AMBULATORY_CARE_PROVIDER_SITE_OTHER): Payer: 59

## 2021-12-09 DIAGNOSIS — Z0000 Encounter for general adult medical examination without abnormal findings: Secondary | ICD-10-CM

## 2021-12-09 DIAGNOSIS — E7800 Pure hypercholesterolemia, unspecified: Secondary | ICD-10-CM | POA: Diagnosis not present

## 2021-12-09 DIAGNOSIS — E538 Deficiency of other specified B group vitamins: Secondary | ICD-10-CM | POA: Diagnosis not present

## 2021-12-09 DIAGNOSIS — E559 Vitamin D deficiency, unspecified: Secondary | ICD-10-CM | POA: Diagnosis not present

## 2021-12-09 DIAGNOSIS — R739 Hyperglycemia, unspecified: Secondary | ICD-10-CM | POA: Diagnosis not present

## 2021-12-09 LAB — URINALYSIS, ROUTINE W REFLEX MICROSCOPIC
Bilirubin Urine: NEGATIVE
Hgb urine dipstick: NEGATIVE
Ketones, ur: NEGATIVE
Leukocytes,Ua: NEGATIVE
Nitrite: NEGATIVE
RBC / HPF: NONE SEEN (ref 0–?)
Specific Gravity, Urine: 1.015 (ref 1.000–1.030)
Total Protein, Urine: NEGATIVE
Urine Glucose: >=1000 — AB
Urobilinogen, UA: 0.2 (ref 0.0–1.0)
WBC, UA: NONE SEEN (ref 0–?)
pH: 5.5 (ref 5.0–8.0)

## 2021-12-09 LAB — BASIC METABOLIC PANEL
BUN: 22 mg/dL (ref 6–23)
CO2: 28 mEq/L (ref 19–32)
Calcium: 9.7 mg/dL (ref 8.4–10.5)
Chloride: 105 mEq/L (ref 96–112)
Creatinine, Ser: 1.34 mg/dL (ref 0.40–1.50)
GFR: 51.73 mL/min — ABNORMAL LOW (ref >60.00)
Glucose, Bld: 111 mg/dL — ABNORMAL HIGH (ref 70–99)
Potassium: 5.1 mEq/L (ref 3.5–5.1)
Sodium: 138 mEq/L (ref 135–145)

## 2021-12-09 LAB — HEPATIC FUNCTION PANEL
ALT: 20 U/L (ref 0–53)
AST: 21 U/L (ref 0–37)
Albumin: 4.3 g/dL (ref 3.5–5.2)
Alkaline Phosphatase: 66 U/L (ref 39–117)
Bilirubin, Direct: 0.2 mg/dL (ref 0.0–0.3)
Total Bilirubin: 0.7 mg/dL (ref 0.2–1.2)
Total Protein: 7 g/dL (ref 6.0–8.3)

## 2021-12-09 LAB — CBC WITH DIFFERENTIAL/PLATELET
Basophils Absolute: 0.1 10*3/uL (ref 0.0–0.1)
Basophils Relative: 1 % (ref 0.0–3.0)
Eosinophils Absolute: 0.2 10*3/uL (ref 0.0–0.7)
Eosinophils Relative: 2.4 % (ref 0.0–5.0)
HCT: 48.3 % (ref 39.0–52.0)
Hemoglobin: 16 g/dL (ref 13.0–17.0)
Lymphocytes Relative: 24.9 % (ref 12.0–46.0)
Lymphs Abs: 1.9 10*3/uL (ref 0.7–4.0)
MCHC: 33 g/dL (ref 30.0–36.0)
MCV: 87.7 fl (ref 78.0–100.0)
Monocytes Absolute: 0.9 10*3/uL (ref 0.1–1.0)
Monocytes Relative: 12.1 % — ABNORMAL HIGH (ref 3.0–12.0)
Neutro Abs: 4.6 10*3/uL (ref 1.4–7.7)
Neutrophils Relative %: 59.6 % (ref 43.0–77.0)
Platelets: 220 10*3/uL (ref 150.0–400.0)
RBC: 5.51 Mil/uL (ref 4.22–5.81)
RDW: 14.4 % (ref 11.5–15.5)
WBC: 7.7 10*3/uL (ref 4.0–10.5)

## 2021-12-09 LAB — TSH: TSH: 3.25 u[IU]/mL (ref 0.35–5.50)

## 2021-12-09 LAB — LIPID PANEL
Cholesterol: 142 mg/dL (ref 0–200)
HDL: 48.6 mg/dL (ref >39.00)
LDL Cholesterol: 72 mg/dL (ref 0–99)
NonHDL: 92.91
Total CHOL/HDL Ratio: 3
Triglycerides: 107 mg/dL (ref 0.0–149.0)
VLDL: 21.4 mg/dL (ref 0.0–40.0)

## 2021-12-09 LAB — HEMOGLOBIN A1C: Hgb A1c MFr Bld: 5.8 % (ref 4.6–6.5)

## 2021-12-09 LAB — VITAMIN D 25 HYDROXY (VIT D DEFICIENCY, FRACTURES): VITD: 26.12 ng/mL — ABNORMAL LOW (ref 30.00–100.00)

## 2021-12-09 LAB — PSA: PSA: 2.23 ng/mL (ref 0.10–4.00)

## 2021-12-09 LAB — VITAMIN B12: Vitamin B-12: 299 pg/mL (ref 211–911)

## 2021-12-09 NOTE — Telephone Encounter (Signed)
Already done earlier today

## 2021-12-09 NOTE — Telephone Encounter (Signed)
Patient would like labs prior to visit on 11/15

## 2021-12-10 ENCOUNTER — Ambulatory Visit (INDEPENDENT_AMBULATORY_CARE_PROVIDER_SITE_OTHER): Payer: 59 | Admitting: Internal Medicine

## 2021-12-10 ENCOUNTER — Ambulatory Visit: Payer: 59 | Admitting: Family Medicine

## 2021-12-10 ENCOUNTER — Encounter: Payer: Self-pay | Admitting: Internal Medicine

## 2021-12-10 VITALS — BP 132/78 | HR 53 | Ht 72.0 in | Wt 190.0 lb

## 2021-12-10 VITALS — BP 116/62 | HR 54 | Temp 98.0°F | Ht 72.0 in | Wt 189.0 lb

## 2021-12-10 DIAGNOSIS — Z125 Encounter for screening for malignant neoplasm of prostate: Secondary | ICD-10-CM | POA: Diagnosis not present

## 2021-12-10 DIAGNOSIS — J309 Allergic rhinitis, unspecified: Secondary | ICD-10-CM | POA: Diagnosis not present

## 2021-12-10 DIAGNOSIS — M25561 Pain in right knee: Secondary | ICD-10-CM | POA: Diagnosis not present

## 2021-12-10 DIAGNOSIS — R739 Hyperglycemia, unspecified: Secondary | ICD-10-CM | POA: Diagnosis not present

## 2021-12-10 DIAGNOSIS — G8929 Other chronic pain: Secondary | ICD-10-CM | POA: Diagnosis not present

## 2021-12-10 DIAGNOSIS — E538 Deficiency of other specified B group vitamins: Secondary | ICD-10-CM

## 2021-12-10 DIAGNOSIS — Z23 Encounter for immunization: Secondary | ICD-10-CM | POA: Diagnosis not present

## 2021-12-10 DIAGNOSIS — Z0001 Encounter for general adult medical examination with abnormal findings: Secondary | ICD-10-CM | POA: Diagnosis not present

## 2021-12-10 DIAGNOSIS — M25562 Pain in left knee: Secondary | ICD-10-CM | POA: Diagnosis not present

## 2021-12-10 DIAGNOSIS — E7800 Pure hypercholesterolemia, unspecified: Secondary | ICD-10-CM | POA: Diagnosis not present

## 2021-12-10 DIAGNOSIS — E559 Vitamin D deficiency, unspecified: Secondary | ICD-10-CM

## 2021-12-10 DIAGNOSIS — Z00121 Encounter for routine child health examination with abnormal findings: Secondary | ICD-10-CM | POA: Insufficient documentation

## 2021-12-10 NOTE — Assessment & Plan Note (Signed)
Lab Results  Component Value Date   LDLCALC 72 12/09/2021   Improved but still uncontrolled, pt to continue current statin crestor 20 mg as declines change for now, for lower chol diet

## 2021-12-10 NOTE — Patient Instructions (Addendum)
Thank you for coming in today.   You received an injection today. Seek immediate medical attention if the joint becomes red, extremely painful, or is oozing fluid.   If this does not work well let me know and I will get you set up for shockwave.

## 2021-12-10 NOTE — Addendum Note (Signed)
Addended by: Corwin Levins on: 12/10/2021 08:03 PM   Modules accepted: Orders

## 2021-12-10 NOTE — Assessment & Plan Note (Signed)
Last vitamin D Lab Results  Component Value Date   VD25OH 26.12 (L) 12/09/2021   Low, to start oral replacement  

## 2021-12-10 NOTE — Assessment & Plan Note (Signed)

## 2021-12-10 NOTE — Progress Notes (Signed)
Patient ID: Thomas Caldwell, male   DOB: 1945-04-03, 75 y.o.   MRN: [id number]         Chief Complaint:: wellness exam and low vitd, hld, hyperglycemia, allergies       HPI:  Thomas Caldwell is a 76 y.o. male here for wellness exam; plans to have second shingrix soon, o/w up to date                        Also Not taking Vit D.  Pt denies chest pain, increased sob or doe, wheezing, orthopnea, PND, increased LE swelling, palpitations, dizziness or syncope.   Pt denies polydipsia, polyuria, or new focal neuro s/s.    Pt denies fever, wt loss, night sweats, loss of appetite, or other constitutional symptoms   Lost a few lbs with better diet.  Does have several wks ongoing nasal allergy symptoms with clearish congestion, itch and sneezing, without fever, pain, ST, cough, swelling or wheezing. Wt Readings from Last 3 Encounters:  12/10/21 190 lb (86.2 kg)  12/10/21 189 lb (85.7 kg)  10/08/21 190 lb (86.2 kg)   BP Readings from Last 3 Encounters:  12/10/21 132/78  12/10/21 116/62  10/08/21 116/64   Immunization History  Administered Date(s) Administered   Fluad Quad(high Dose 65+) 10/17/2018, 11/29/2019, 12/10/2021   Influenza Whole 11/28/2008   Influenza, High Dose Seasonal PF 04/18/2015   Influenza,inj,Quad PF,6+ Mos 01/02/2013, 09/28/2013   Influenza-Unspecified 10/24/2020   Moderna SARS-COV2 Booster Vaccination 11/27/2019, 02/02/2020   Moderna Sars-Covid-2 Vaccination 02/17/2019, 03/15/2019   Pneumococcal Conjugate-13 06/29/2013   Pneumococcal Polysaccharide-23 05/01/2011   Td 07/09/2008   Tdap 10/17/2018   Zoster Recombinat (Shingrix) 12/03/2020   Zoster, Live 07/05/2007  There are no preventive care reminders to display for this patient.    Past Medical History:  Diagnosis Date   ALLERGIC RHINITIS 01/11/2008   ANEURYSM 02/02/2007   brain   BRADYCARDIA, CHRONIC 10/06/2007   DIVERTICULOSIS, COLON 07/05/2007   EUSTACHIAN TUBE DYSFUNCTION 04/06/2009   GERD 07/05/2007    HYPERLIPIDEMIA 02/02/2007   HYPERTENSION 02/02/2007   Lumbago 01/11/2008   Peptic ulcer    PERSISTENT DISORDER INITIATING/MAINTAINING SLEEP 02/02/2007   Past Surgical History:  Procedure Laterality Date   APPENDECTOMY  1982   LEFT HEART CATH AND CORONARY ANGIOGRAPHY N/A 10/04/2019   Procedure: LEFT HEART CATH AND CORONARY ANGIOGRAPHY;  Surgeon: Lennette Bihari, MD;  Location: [facility];  Service: Cardiovascular;  Laterality: N/A;   LUMBAR DISC SURGERY  12/2005   Dr. Venetia Maxon   [facility] Left brain  1992   due to brain aneurysm s/p repair    reports that he has never smoked. He has never used smokeless tobacco. He reports current alcohol use. He reports that he does not use drugs. family history includes Esophageal cancer in his brother; Heart disease in his father and sister; Hypertension in his brother and father. Allergies  Allergen Reactions   Codeine Other (See Comments)    Confusion   Eplerenone Other (See Comments)    Breast tenderness   Spironolactone Other (See Comments)    Breast tenderness   Current Outpatient Medications on File Prior to Visit  Medication Sig Dispense Refill   aspirin 81 MG EC tablet Take 81 mg by mouth daily.     dronedarone (MULTAQ) 400 MG tablet Take 1 tablet (400 mg total) by mouth 2 (two) times daily with a meal. 60 tablet 11   empagliflozin (JARDIANCE) 10 MG TABS tablet Take 1  tablet (10 mg total) by mouth daily before breakfast. 90 tablet 3   guaiFENesin (MUCINEX) 600 MG 12 hr tablet Take 2 tablets (1,200 mg total) by mouth 2 (two) times daily as needed. 60 tablet 1   metoprolol succinate (TOPROL XL) 25 MG 24 hr tablet Take 1 tablet (25 mg total) by mouth daily. 90 tablet 3   rosuvastatin (CRESTOR) 20 MG tablet TAKE 1 TABLET(20 MG) BY MOUTH DAILY 90 tablet 2   sacubitril-valsartan (ENTRESTO) 97-103 MG Take 1 tablet by mouth 2 (two) times daily. 180 tablet 3   No current facility-administered medications on file prior to visit.        ROS:  All others  reviewed and negative.  Objective        PE:  BP 116/62 (BP Location: Left Arm, Patient Position: Sitting, Cuff Size: Large)   Pulse (!) 54   Temp 98 F (36.7 C) (Oral)   Ht 6' (1.829 m)   Wt 189 lb (85.7 kg)   SpO2 95%   BMI 25.63 kg/m                 Constitutional: Pt appears in NAD               HENT: Head: NCAT.                Right Ear: External ear normal.                 Left Ear: External ear normal. Bilat tm's with mild erythema.  Max sinus areas non tender.  Pharynx with mild erythema, no exudate               Eyes: . Pupils are equal, round, and reactive to light. Conjunctivae and EOM are normal               Nose: without d/c or deformity               Neck: Neck supple. Gross normal ROM               Cardiovascular: Normal rate and regular rhythm.                 Pulmonary/Chest: Effort normal and breath sounds without rales or wheezing.                Abd:  Soft, NT, ND, + BS, no organomegaly               Neurological: Pt is alert. At baseline orientation, motor grossly intact               Skin: Skin is warm. No rashes, no other new lesions, LE edema - none               Psychiatric: Pt behavior is normal without agitation   Micro: none  Cardiac tracings I have personally interpreted today:  none  Pertinent Radiological findings (summarize): none   Lab Results  Component Value Date   WBC 7.7 12/09/2021   HGB 16.0 12/09/2021   HCT 48.3 12/09/2021   PLT 220.0 12/09/2021   GLUCOSE 111 (H) 12/09/2021   CHOL 142 12/09/2021   TRIG 107.0 12/09/2021   HDL 48.60 12/09/2021   LDLCALC 72 12/09/2021   ALT 20 12/09/2021   AST 21 12/09/2021   NA 138 12/09/2021   K 5.1 12/09/2021   CL 105 12/09/2021   CREATININE 1.34 12/09/2021   BUN 22 12/09/2021   CO2  28 12/09/2021   TSH 3.25 12/09/2021   PSA 2.23 12/09/2021   HGBA1C 5.8 12/09/2021   Assessment/Plan:  Thomas Caldwell is a 76 y.o. White or Caucasian [1] male with  has a past medical history of ALLERGIC  RHINITIS (01/11/2008), ANEURYSM (02/02/2007), BRADYCARDIA, CHRONIC (10/06/2007), DIVERTICULOSIS, COLON (07/05/2007), EUSTACHIAN TUBE DYSFUNCTION (04/06/2009), GERD (07/05/2007), HYPERLIPIDEMIA (02/02/2007), HYPERTENSION (02/02/2007), Lumbago (01/11/2008), Peptic ulcer, and PERSISTENT DISORDER INITIATING/MAINTAINING SLEEP (02/02/2007).  Vitamin D deficiency Last vitamin D Lab Results  Component Value Date   VD25OH 26.12 (L) 12/09/2021   Low, to start oral replacement   Encounter for well adult exam with abnormal findings Age and sex appropriate education and counseling updated with regular exercise and diet Referrals for preventative services - none needed Immunizations addressed - for shingrix at pharmacy Smoking counseling  - none needed Evidence for depression or other mood disorder - none significant Most recent labs reviewed. I have personally reviewed and have noted: 1) the patient's medical and social history 2) The patient's current medications and supplements 3) The patient's height, weight, and BMI have been recorded in the chart   Pure hypercholesterolemia Lab Results  Component Value Date   LDLCALC 72 12/09/2021   Improved but still uncontrolled, pt to continue current statin crestor 20 mg as declines change for now, for lower chol diet   Hyperglycemia Lab Results  Component Value Date   HGBA1C 5.8 12/09/2021   Stable, pt to continue current medical treatment Jardiance 10 mg qd   Allergic rhinitis Mild to mod, for Nasacort asd,  to f/u any worsening symptoms or concerns  Followup: Return in about 1 year (around 12/11/2022).  Oliver Barre, MD 12/10/2021 8:01 PM [facility] [place] [facility] - [facility]

## 2021-12-10 NOTE — Assessment & Plan Note (Signed)
Lab Results  Component Value Date   HGBA1C 5.8 12/09/2021   Stable, pt to continue current medical treatment Jardiance 10 mg qd

## 2021-12-10 NOTE — Progress Notes (Unsigned)
   I, Philbert Riser, LAT, ATC acting as a scribe for [name], MD.  Thomas Caldwell is a 76 y.o. male who presents to [facility] at [facility] today for continued bilateral knee pain.  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 Patient was last seen by Dr. Denyse Amass on 08/20/2021 for bilateral hip pain.  Today, patient reports bilat knee pain has cont'd. Pt reports he is here for a "knee check up."  He notes continued knee pain primarily at the anterior knee.  Knee swelling: no Mechanical symptoms: no Aggravates: walking, transitioning to stand Treatments tried: Tylenol, KT tape, compression sleeve  Dx imaging: 09/28/2020 R knee MRI 09/11/2020 R knee XR 09/08/2020 L knee MRI 08/28/2020 R knee XR 07/10/2020 L knee XR 03/23/2019 R knee XR  Pertinent review of systems: No fever or chills  Relevant historical information: History of partial bilateral quad tendon rupture.   Exam:  BP 132/78   Pulse (!) 53   Ht 6' (1.829 m)   Wt 190 lb (86.2 kg)   SpO2 97%   BMI 25.77 kg/m  General: Well Developed, well nourished, and in no acute distress.   MSK: Bilateral knees decreased quad bulk.  Normal knee motion.  Intact strength.  Tender palpation anterior knees of the distal quad tendon.    Lab and Radiology Results  Anterior knee injection: right Consent obtained and timeout performed. Risks reviewed including potential risk for infection, hyperglycemia etc. Area medial to the patella tendon identified and marked. Skin sterilized with isopropyl alcohol. Cold spray applied 22-gauge needle used to access the knee joint and 40 mg of Kenalog and 2 mL of Marcaine injected into the knee joint Patient tolerated the procedure well.  Anterior knee injection: left Consent obtained and timeout performed. Risks reviewed including potential risk for infection, hyperglycemia etc. Area medial to the  patella tendon identified and marked. Skin sterilized with isopropyl alcohol. Cold spray applied 22-gauge needle used to access the knee joint and 40 mg of Kenalog and 2 mL of Marcaine injected into the knee joint Patient tolerated the procedure well.    Assessment and Plan: 76 y.o. 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  Plan for anterior knee injection.  Normally I would do superior lateral ultrasound-guided knee injection but I would like to keep the steroid away from the quad tendons.  Continue quad strengthening exercises.  If not improving would consider shockwave therapy.  He will let me know how he feels and I can him set up for shockwave therapy as needed.   PDMP not reviewed this encounter. No orders of the defined types were placed in this encounter.  No orders of the defined types were placed in this encounter.    Discussed warning signs or symptoms. Please see discharge instructions. Patient expresses understanding.   The above documentation has been reviewed and is accurate and complete [name], M.D.

## 2021-12-10 NOTE — Assessment & Plan Note (Signed)
Mild to mod, for Nasacort asd,  to f/u any worsening symptoms or concerns

## 2021-12-10 NOTE — Patient Instructions (Addendum)
Please have your Shingrix (shingles) shots done at your local pharmacy.  You had the flu shot today  Please continue all other medications as before, and refills have been done if requested.  Please have the pharmacy call with any other refills you may need.  Please continue your efforts at being more active, low cholesterol diet, and weight control.  You are otherwise up to date with prevention measures today.  Please keep your appointments with your specialists as you may have planned - cardiology  Please go to the LAB at the blood drawing area for the tests to be done  You will be contacted by phone if any changes need to be made immediately.  Otherwise, you will receive a letter about your results with an explanation, but please check with MyChart first.  Please remember to sign up for MyChart if you have not done so, as this will be important to you in the future with finding out test results, communicating by private email, and scheduling acute appointments online when needed.  Please make an Appointment to return for your 1 year visit, or sooner if needed, with Lab testing by Appointment as well, to be done about 3-5 days before at the FIRST FLOOR Lab (so this is for TWO appointments - please see the scheduling desk as you leave)

## 2021-12-11 ENCOUNTER — Telehealth (HOSPITAL_COMMUNITY): Payer: Self-pay | Admitting: Emergency Medicine

## 2021-12-11 NOTE — Telephone Encounter (Signed)
Reaching out to patient to offer assistance regarding upcoming cardiac imaging study; pt verbalizes understanding of appt date/time, parking situation and where to check in, pre-test NPO status and medications ordered, and verified current allergies; name and call back number provided for further questions should they arise Rockwell Alexandria RN Navigator Cardiac Imaging [facility] [phone] office [phone] cell  Denies metal implants Denies claustro Denies iv issues

## 2021-12-11 NOTE — Telephone Encounter (Signed)
Attempted to call patient regarding upcoming cardiac MR appointment. Left message on voicemail with name and callback number Viriginia Amendola RN Navigator Cardiac Imaging [facility] [phone] Office [phone] Cell  

## 2021-12-15 ENCOUNTER — Other Ambulatory Visit: Payer: Self-pay | Admitting: Internal Medicine

## 2021-12-15 ENCOUNTER — Ambulatory Visit (HOSPITAL_COMMUNITY)
Admission: RE | Admit: 2021-12-15 | Discharge: 2021-12-15 | Disposition: A | Discharge status: Home / Self Care | Payer: 59 | Source: Ambulatory Visit | Attending: Internal Medicine | Admitting: Internal Medicine

## 2021-12-15 DIAGNOSIS — I428 Other cardiomyopathies: Secondary | ICD-10-CM

## 2021-12-15 MED ORDER — GADOBUTROL 1 MMOL/ML IV SOLN
9.0000 mL | Freq: Once | INTRAVENOUS | Status: AC | PRN
  Administered 2021-12-15: 9 mL via INTRAVENOUS

## 2021-12-17 ENCOUNTER — Other Ambulatory Visit: Payer: Self-pay | Admitting: Internal Medicine

## 2021-12-17 NOTE — Telephone Encounter (Signed)
Please refill as per office routine med refill policy (all routine meds to be refilled for 3 mo or monthly (per pt preference) up to one year from last visit, then month to month grace period for 3 mo, then further med refills will have to be denied) ? ?

## 2021-12-25 ENCOUNTER — Telehealth: Payer: Self-pay | Admitting: Internal Medicine

## 2021-12-25 ENCOUNTER — Encounter: Payer: Self-pay | Admitting: Internal Medicine

## 2021-12-25 ENCOUNTER — Ambulatory Visit: Payer: 59 | Attending: Internal Medicine | Admitting: Internal Medicine

## 2021-12-25 VITALS — BP 126/80 | HR 49 | Ht 73.0 in | Wt 191.0 lb

## 2021-12-25 DIAGNOSIS — I428 Other cardiomyopathies: Secondary | ICD-10-CM | POA: Diagnosis not present

## 2021-12-25 DIAGNOSIS — I493 Ventricular premature depolarization: Secondary | ICD-10-CM

## 2021-12-25 NOTE — Patient Instructions (Signed)
Medication Instructions:  Your physician recommends that you continue on your current medications as directed. Please refer to the Current Medication list given to you today.  *If you need a refill on your cardiac medications before your next appointment, please call your pharmacy*   Lab Work: None ordered.  If you have labs (blood work) drawn today and your tests are completely normal, you will receive your results only by: MyChart Message (if you have MyChart) OR A paper copy in the mail If you have any lab test that is abnormal or we need to change your treatment, we will call you to review the results.   Testing/Procedures: None ordered.    Follow-Up: At [facility], you and your health needs are our priority.  As part of our continuing mission to provide you with exceptional heart care, we have created designated Provider Care Teams.  These Care Teams include your primary Cardiologist (physician) and Advanced Practice Providers (APPs -  Physician Assistants and Nurse Practitioners) who all work together to provide you with the care you need, when you need it.  We recommend signing up for the patient portal called "MyChart".  Sign up information is provided on this After Visit Summary.  MyChart is used to connect with patients for Virtual Visits (Telemedicine).  Patients are able to view lab/test results, encounter notes, upcoming appointments, etc.  Non-urgent messages can be sent to your provider as well.   To learn more about what you can do with MyChart, go to [email or web address].    Your next appointment:   12 months with Dr Klein  Important Information About Sugar       

## 2021-12-25 NOTE — Progress Notes (Signed)
8.  8 BHU.  He will     Patient Care Team: Corwin Levins, MD as PCP - Isaiah Blakes, MD as PCP - Electrophysiology (Cardiology)   HPI  Thomas Caldwell is a 76 y.o. 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  cMRI was questioning the possibility of LV [place] with prominent trabeculations  Flecainide initially used for PVC suppression complicated by l his [place] is a good night so long look at that " Intake was cold outside left bundle branch block and discontinued Saw Dr. 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    The patient denies chest pain, shortness of breath, nocturnal dyspnea, orthopnea or peripheral edema.  There have been no palpitations, lightheadedness or syncope.    Resting heart rate can be in the 40s, exercise heart rates playing golf or in the 70s.  But no limitations  DATE TEST EF   9/21 Echo   30-35 %   9/21 LHC  <25 % LADm-60  10/21 cMRI 34% LV dilated Prominent trabeculations  ? LVNC LGE neg  12/21 Echo  30-35%   3/23 Echo  40-45%   11/23 cMRI 41% Less evidence of LVNC   Date PVCs  9/21 7.9  6/22 7.9   Date Cr K Hgb  12/21 1.2 4.7 14.4   3/23 1.35 4.4 14.4 (11/22)       Records and Results Reviewed   Past Medical History:  Diagnosis Date   ALLERGIC RHINITIS 01/11/2008   ANEURYSM 02/02/2007   brain   BRADYCARDIA, CHRONIC 10/06/2007   DIVERTICULOSIS, COLON 07/05/2007   EUSTACHIAN TUBE DYSFUNCTION 04/06/2009   GERD 07/05/2007   HYPERLIPIDEMIA 02/02/2007   HYPERTENSION 02/02/2007   Lumbago 01/11/2008   Peptic ulcer    PERSISTENT DISORDER INITIATING/MAINTAINING SLEEP 02/02/2007    Past Surgical History:  Procedure Laterality Date   APPENDECTOMY  1982   LEFT HEART CATH AND CORONARY ANGIOGRAPHY N/A 10/04/2019    Procedure: LEFT HEART CATH AND CORONARY ANGIOGRAPHY;  Surgeon: Lennette Bihari, MD;  Location: [facility];  Service: Cardiovascular;  Laterality: N/A;   LUMBAR DISC SURGERY  12/2005   Dr. Venetia Maxon   [facility] Left brain  1992   due to brain aneurysm s/p repair    Current Meds  Medication Sig   aspirin 81 MG EC tablet Take 81 mg by mouth daily.   dronedarone (MULTAQ) 400 MG tablet Take 1 tablet (400 mg total) by mouth 2 (two) times daily with a meal.   empagliflozin (JARDIANCE) 10 MG TABS tablet Take 1 tablet (10 mg total) by mouth daily before breakfast.   guaiFENesin (MUCINEX) 600 MG 12 hr tablet Take 2 tablets (1,200 mg total) by mouth 2 (two) times daily as needed.   metoprolol succinate (TOPROL XL) 25 MG 24 hr tablet Take 1 tablet (25 mg total) by mouth daily.   rosuvastatin (CRESTOR) 20 MG tablet TAKE 1 TABLET(20 MG) BY MOUTH DAILY   sacubitril-valsartan (ENTRESTO) 97-103 MG Take 1 tablet by mouth 2 (two) times daily.    Allergies  Allergen Reactions   Codeine Other (See Comments)    Confusion   Eplerenone Other (See Comments)    Breast tenderness   Spironolactone Other (See Comments)    Breast tenderness      Review  of Systems negative except from HPI and PMH  Physical Exam BP 126/80   Pulse (!) 49   Ht 6\' 1"  (1.854 m)   Wt 191 lb (86.6 kg)   SpO2 98%   BMI 25.20 kg/m  Well developed and nourished in no acute distress HENT normal Neck supple with JVP-  flat   Clear Regular rate and rhythm, no murmurs or gallops Abd-soft with active BS No Clubbing cyanosis edema Skin-warm and dry A & Oriented  Grossly normal sensory and motor function  ECG sinus at 49 Was 18[birth year]   Assessment and  Plan  Ventricular tachycardia Nonsustained/PVCs  Dronaderone therapy    Cardiomyopathy previously but 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  Continue him on his dronaderone of 400 mg twice  daily.  Cardiomyopathy interval improvement.  Continue him on his Entresto 97/103 metoprolol is at 25 and this may need to come down based on his bradycardia.  History of hypokalemia with spironolactone and eplerenone

## 2021-12-25 NOTE — Telephone Encounter (Signed)
Pt has dropped off a 'Annual Wellness' form from his insurance to be filled out by Dr. Jonny Ruiz  and it has been placed in Dr. Raphael Gibney Box.  Upon completion please call Harvie Heck to pick the form up.  [phone]

## 2022-01-05 ENCOUNTER — Other Ambulatory Visit: Payer: Self-pay

## 2022-01-05 MED ORDER — DRONEDARONE HCL 400 MG PO TABS: 400.0000 mg | ORAL_TABLET | Freq: Two times a day (BID) | ORAL | 11 refills | Status: DC

## 2022-01-28 NOTE — Progress Notes (Unsigned)
Cardiology Office Note:    Date:  01/29/2022   ID:  Thomas Caldwell, DOB 16-Oct-1945, MRN [id number]  PCP:  Thomas Borg, MD   [place] Providers Cardiologist:  Thomas Lean, MD Electrophysiologist:  Thomas Axe, MD     Referring MD: Thomas Borg, MD   CC: Heart failure  History of Present Illness:    Thomas Caldwell is a 77 y.o. male with a hx of heart failure with polmorphic PVCs on Multaq, HF with spironolactone hyperkalemia, eplerenone breast tenderness.   2023: Established with me; no evidence of LVNC by [place] critereia  Patient notes that he is doing well.   Since last visit notes that he is still traveling and playing golf . There are no interval hospital/ED visit.    No chest pain or pressure .  No SOB/DOE and no PND/Orthopnea.  No weight gain or leg swelling.  No palpitations or syncope.  His BP at home is usually good home.  He woke up at 3 AM to find that there is political unrest in [place] and he had to cancel his trip tomorrow.  BP is otherwise ok.   Past Medical History:  Diagnosis Date   ALLERGIC RHINITIS 01/11/2008   ANEURYSM 02/02/2007   brain   BRADYCARDIA, CHRONIC 10/06/2007   DIVERTICULOSIS, COLON 07/05/2007   EUSTACHIAN TUBE DYSFUNCTION 04/06/2009   GERD 07/05/2007   HYPERLIPIDEMIA 02/02/2007   HYPERTENSION 02/02/2007   Lumbago 01/11/2008   Peptic ulcer    PERSISTENT DISORDER INITIATING/MAINTAINING SLEEP 02/02/2007    Past Surgical History:  Procedure Laterality Date   APPENDECTOMY  1982   LEFT HEART CATH AND CORONARY ANGIOGRAPHY N/A 10/04/2019   Procedure: LEFT HEART CATH AND CORONARY ANGIOGRAPHY;  Surgeon: Thomas Sine, MD;  Location: [place] CV [facility];  Service: Cardiovascular;  Laterality: N/A;   LUMBAR DISC SURGERY  12/2005   Thomas Caldwell   [facility] Left brain  1992   due to brain aneurysm s/p repair    Current Medications: Current Meds  Medication Sig   aspirin 81 MG EC tablet Take 81 mg by mouth daily.    dronedarone (MULTAQ) 400 MG tablet Take 1 tablet (400 mg total) by mouth 2 (two) times daily with a meal.   empagliflozin (JARDIANCE) 10 MG TABS tablet Take 1 tablet (10 mg total) by mouth daily before breakfast.   guaiFENesin (MUCINEX) 600 MG 12 hr tablet Take 2 tablets (1,200 mg total) by mouth 2 (two) times daily as needed.   metoprolol succinate (TOPROL XL) 25 MG 24 hr tablet Take 1 tablet (25 mg total) by mouth daily.   rosuvastatin (CRESTOR) 20 MG tablet TAKE 1 TABLET(20 MG) BY MOUTH DAILY   sacubitril-valsartan (ENTRESTO) 97-103 MG Take 1 tablet by mouth 2 (two) times daily.     Allergies:   Codeine, Eplerenone, and Spironolactone   Social History   Socioeconomic History   Marital status: Married    Spouse name: Not on file   Number of children: 2   Years of education: Not on file   Highest education level: Not on file  Occupational History   Occupation: furniture    Employer: [employer]  Tobacco Use   Smoking status: Never   Smokeless tobacco: Never  Substance and Sexual Activity   Alcohol use: Yes    Comment: 0-1 per day   Drug use: No   Sexual activity: Yes    Birth control/protection: None  Other Topics Concern   Not on file  Social History Narrative   Not on file   Social Determinants of Health   Financial Resource Strain: Not on file  Food Insecurity: Not on file  Transportation Needs: Not on file  Physical Activity: Not on file  Stress: Not on file  Social Connections: Not on file    Social: Still working; in [place] in 2023 didn't go in 2024; avid golfer  Family History: Thomas patient's family history includes Esophageal cancer in his brother; Heart disease in his father and sister; Hypertension in his brother and father. There is no history of Colon cancer, Rectal cancer, or Stomach cancer. No SCD or LVNC history  ROS:   Please see Thomas history of present illness.     All other systems reviewed and are negative.  EKGs/Labs/Other Studies  Reviewed:    Thomas following studies were reviewed today:   Cardiac Studies & Procedures   CARDIAC CATHETERIZATION  CARDIAC CATHETERIZATION 10/04/2019  Narrative  Prox RCA lesion is 5% stenosed.  Mid LAD lesion is 60% stenosed.  There is severe left ventricular systolic dysfunction.  LV end diastolic pressure is normal.  Thomas left ventricular ejection fraction is less than 25% by visual estimate.  There is evidence for mild coronary calcification involving Thomas proximal LAD and left circumflex coronary arteries.  Thomas LAD has a stenosis on a bend in Thomas vessel between Thomas first small diagonal and next major diagonal vessel.  In most views this appears to be 40 to 50%.  However in Thomas RAO and AP cranial views it appears at least 60 to possibly 70%.  There is brisk TIMI-3 flow.  Mild calcification in Thomas left circumflex coronary artery without obstructive disease.  Mild irregularity in Thomas proximal RCA which gives rise to a large PDA vessel distally.  Severe global LV dysfunction with EF estimate at 20 to 25% with global hypocontractility.  LVEDP 13 mm  RECOMMENDATION: I discussed Thomas catheterization findings with Dr. Adam Caldwell.  Thomas patient will undergo a 2D echo Doppler study today post catheterization in Thomas short stay unit prior to going home.  He will follow up with Dr. Caryl Caldwell for adjustments to his medical management and further recommendations.  Findings Coronary Findings Diagnostic  Dominance: Right  Left Anterior Descending Mid LAD lesion is 60% stenosed.  Right Coronary Artery Prox RCA lesion is 5% stenosed.  Intervention  No interventions have been documented.     ECHOCARDIOGRAM  ECHOCARDIOGRAM COMPLETE 04/07/2021  Narrative ECHOCARDIOGRAM REPORT    Patient Name:   Thomas Caldwell Date of Exam: 04/07/2021 Medical Rec #:  [id number]         Height:       72.0 in Accession #:    [id number]        Weight:       197.0 lb Date of Birth:  23-Jan-1946          BSA:          2.117 m Patient Age:    41 years          BP:           120/60 mmHg Patient Gender: M                 HR:           55 bpm. Exam Location:  [facility]  Procedure: 2D Echo, Cardiac Doppler, Color Doppler and Intracardiac Opacification Agent  Indications:    I47.2 Ventricular tachycardia; R00.2 Palpitations  History:  Patient has prior history of Echocardiogram examinations, most recent 01/01/2020. CHF; Risk Factors:Hypertension and Dyslipidemia. Sinus bradycardia. Hyperglycemia. Fatigue.  Sonographer:    Cathie Beams RCS Referring Phys: [id number] Duke Salvia  IMPRESSIONS   1. No mural thrombus. Left ventricular ejection fraction, by estimation, is 40 to 45%. Left ventricular ejection fraction by 2D MOD biplane is 39.9 %. Thomas left ventricle has mildly decreased function. Thomas left ventricle demonstrates global hypokinesis. Left ventricular diastolic parameters are consistent with Grade I diastolic dysfunction (impaired relaxation). 2. Right ventricular systolic function is normal. Thomas right ventricular size is normal. Tricuspid regurgitation signal is inadequate for assessing PA pressure. 3. Left atrial size was moderately dilated. 4. Thomas mitral valve is abnormal. Mild mitral valve regurgitation. 5. Thomas aortic valve is tricuspid. Aortic valve regurgitation is not visualized. Aortic valve sclerosis/calcification is present, without any evidence of aortic stenosis. 6. Thomas pulmonic valve was abnormal.  Comparison(s): Changes from prior study are noted. 01/01/2020: LVEF 30-35%.  FINDINGS Left Ventricle: No mural thrombus. Left ventricular ejection fraction, by estimation, is 40 to 45%. Left ventricular ejection fraction by 2D MOD biplane is 39.9 %. Thomas left ventricle has mildly decreased function. Thomas left ventricle demonstrates global hypokinesis. Definity contrast agent was given IV to delineate Thomas left ventricular endocardial borders. Thomas left ventricular internal  cavity size was normal in size. There is no left ventricular hypertrophy. Left ventricular diastolic parameters are consistent with Grade I diastolic dysfunction (impaired relaxation). Indeterminate filling pressures.  Right Ventricle: Thomas right ventricular size is normal. No increase in right ventricular wall thickness. Right ventricular systolic function is normal. Tricuspid regurgitation signal is inadequate for assessing PA pressure.  Left Atrium: Left atrial size was moderately dilated.  Right Atrium: Right atrial size was normal in size.  Pericardium: There is no evidence of pericardial effusion.  Mitral Valve: Thomas mitral valve is abnormal. There is mild thickening of Thomas anterior and posterior mitral valve leaflet(s). Mild mitral valve regurgitation.  Tricuspid Valve: Thomas tricuspid valve is grossly normal. Tricuspid valve regurgitation is trivial.  Aortic Valve: Thomas aortic valve is tricuspid. Aortic valve regurgitation is not visualized. Aortic valve sclerosis/calcification is present, without any evidence of aortic stenosis.  Pulmonic Valve: Thomas pulmonic valve was abnormal. Pulmonic valve regurgitation is mild.  Aorta: Thomas aortic root and ascending aorta are structurally normal, with no evidence of dilitation.  Venous: Thomas inferior vena cava was not well visualized.  IAS/Shunts: No atrial level shunt detected by color flow Doppler.   LEFT VENTRICLE PLAX 2D                        Biplane EF (MOD) LVIDd:         6.00 cm         LV Biplane EF:   Left LVIDs:         4.70 cm                          ventricular LV PW:         0.90 cm                          ejection LV IVS:        0.80 cm                          fraction by LVOT diam:  2.30 cm                          2D MOD LV SV:         83                               biplane is LV SV Index:   39                               39.9 %. LVOT Area:     4.15 cm Diastology LV e' medial:    5.19 cm/s LV Volumes (MOD)                LV E/e' medial:  12.1 LV vol d, MOD    184.6 ml      LV e' lateral:   9.81 cm/s A2C:                           LV E/e' lateral: 6.4 LV vol d, MOD    219.9 ml A4C: LV vol s, MOD    103.5 ml A2C: LV vol s, MOD    139.9 ml A4C: LV SV MOD A2C:   81.1 ml LV SV MOD A4C:   219.9 ml LV SV MOD BP:    81.4 ml  RIGHT VENTRICLE RV Basal diam:  3.40 cm RV S prime:     18.90 cm/s TAPSE (M-mode): 2.2 cm  LEFT ATRIUM             Index        RIGHT ATRIUM           Index LA diam:        4.80 cm 2.27 cm/m   RA Area:     16.20 cm LA Vol (A2C):   73.4 ml 34.67 ml/m  RA Volume:   40.80 ml  19.27 ml/m LA Vol (A4C):   93.8 ml 44.31 ml/m LA Biplane Vol: 84.6 ml 39.96 ml/m AORTIC VALVE LVOT Vmax:   68.30 cm/s LVOT Vmean:  45.100 cm/s LVOT VTI:    0.200 m  AORTA Ao Root diam: 3.20 cm Ao Asc diam:  2.80 cm  MITRAL VALVE MV Area (PHT): 1.69 cm    SHUNTS MV Decel Time: 448 msec    Systemic VTI:  0.20 m MV E velocity: 62.90 cm/s  Systemic Diam: 2.30 cm MV A velocity: 76.50 cm/s MV E/A ratio:  0.82  Lyman Bishop MD Electronically signed by Lyman Bishop MD Signature Date/Time: 04/07/2021/1:12:05 PM    Final    MONITORS  LONG TERM MONITOR (3-14 DAYS) 06/23/2020  Narrative Patch Wear Time:  2 days and 22 hours (2022-03-28T07:03:41-0400 to 2022-03-31T05:57:48-0400)  Patient had a min HR of 41 bpm, max HR of 176 bpm, and avg HR of 56 bpm. Predominant underlying rhythm was Sinus Rhythm.  34 Ventricular Tachycardia runs occurred, Thomas run with Thomas fastest interval lasting 4 beats with a max rate of 176 bpm, Thomas longest lasting 7 beats with an avg rate of 112 bpm.  Isolated SVEs were rare (<1.0%), and no SVE Couplets or SVE Triplets were present. Isolated VEs were occasional (4.3%, 7840), VE Couplets were occasional (3.4%, 3091), and VE Triplets were rare (<1.0%, 191). Ventricular Bigeminy and Trigeminy were present.    CARDIAC MRI  MR CARDIAC MORPHOLOGY W WO CONTRAST  12/15/2021  Narrative CLINICAL DATA:  Clinical question of LVNC  EXAM: CARDIAC MRI  TECHNIQUE: Thomas patient was scanned on a 1.5 Tesla GE magnet. A dedicated cardiac coil was used. Functional imaging was done using F[place]a sequences. 2,3, and 4 chamber views were done to assess for RWMA's. Modified Simpson's rule using a short axis stack was used to calculate an ejection fraction on a dedicated work Conservation officer, nature. Thomas patient received 9 cc of Gadavist. After 10 minutes inversion recovery sequences were used to assess for infiltration and scar tissue. Flow quantification was performed 2 times during this examination with flow quantification performed at Thomas levels of Thomas ascending aorta above Thomas valve, pulmonary artery above Thomas valve.  CONTRAST:  9 cc  of Gadavist  FINDINGS: 1. Mild increase in left ventricular size, with LVEDD 58 mm, but LVEDVi 97 mL/m2.  Normal left ventricular thickness, with intraventricular septal thickness of 10 mm, posterior wall thickness of 8 mm, and myocardial mass index of 53 g/m2.  Moderately reduced left ventricular systolic function (LVEF [id number]). There is septal dyssynchrony and abnormal septal motion. Maximal trabeculation ratio 1.75.  Left ventricular parametric mapping notable for normal ECV.  There is no late gadolinium enhancement in Thomas left ventricular myocardium.  2. Normal right ventricular size with RVEDVI 90 mL/m2.  Normal right ventricular thickness.  Normal right ventricular systolic function (RVEF [id number]). There are no regional wall motion abnormalities or aneurysms.  3.  Normal left and right atrial size.  4. Normal size of Thomas aortic root, ascending aorta and pulmonary artery.  5. Valve assessment:  Aortic Valve: Tri-leaflet aortic valve. There is mild regurgitation. Regurgitant fraction 10%.  Pulmonic Valve: There is mild regurgitation. Regurgitant fraction 9%.  Tricuspid Valve: Qualitatively,  there is mild regurgitation.  Mitral Valve: Qualitatively, there is mild regurgitation.  6.  Normal pericardium.  No pericardial effusion.  7. Grossly, no extracardiac findings. Recommended dedicated study if concerned for non-cardiac pathology.  IMPRESSION: Compared to prior, this study is less suggestive of LVNC. LVEF has improved.  Rudean Haskell MD   Electronically Signed By: Rudean Haskell M.D. On: 12/15/2021 21:45              Recent Labs: 12/09/2021: ALT 20; BUN 22; Creatinine, Ser 1.34; Hemoglobin 16.0; Platelets 220.0; Potassium 5.1; Sodium 138; TSH 3.25  Recent Lipid Panel    Component Value Date/Time   CHOL 142 12/09/2021 0837   TRIG 107.0 12/09/2021 0837   HDL 48.60 12/09/2021 0837   CHOLHDL 3 12/09/2021 0837   VLDL 21.4 12/09/2021 0837   LDLCALC 72 12/09/2021 0837   HYPERTENSION CONTROL Vitals:   01/29/22 0755 01/29/22 0816  BP: (!) 158/70 (!) 160/70    Thomas patient's blood pressure is elevated above target today.  In order to address Thomas patient's elevated BP: Blood pressure will be monitored at home to determine if medication changes need to be made. (AMB BP WNL; unusual circumstances today)       Physical Exam:    VS:  BP (!) 160/70   Pulse (!) 58   Ht 6' (1.829 m)   Wt 194 lb 12.8 oz (88.4 kg)   SpO2 97%   BMI 26.42 kg/m     Wt Readings from Last 3 Encounters:  01/29/22 194 lb 12.8 oz (88.4 kg)  12/25/21 191 lb (86.6 kg)  12/10/21 190 lb (86.2 kg)    Gen: No distress   Neck: No JVD  Ears: no Pilar Plate Sign Cardiac: No Rubs or Gallops, no Murmur, regular brady, +2 radial pulses Respiratory: Clear to auscultation bilaterally, normal effort, normal  respiratory rate GI: Soft, nontender, non-distended  MS: No  edema;  moves all extremities Integument: Skin feels warm Neuro:  At time of evaluation, alert and oriented to person/place/time/situation  Psych: Normal affect, patient feels well  ASSESSMENT:    1. Ventricular  tachycardia ([place])   2. Chronic systolic heart failure ([place])   3. Non-ischemic cardiomyopathy ([place])   4. PVC's (premature ventricular contractions)   5. Pure hypercholesterolemia     PLAN:     Heart Failure Reduced Ejection Fraction HTN - NYHA class NYHA, Stage A, euvolemic - Diuretic regimen: None - he had allergies to eplerenone and spironolactone (gynecomastia- painful)  - continue SGLTI, ARNI - max tolerate BB succinate 25 mg PO daily if sx with bradycardia would stop - will start amb BP monitoring; if worsening will start IMDUR - study is less consistent with true LVNC; will not initiation AC or family screening  PVCs - On Multaq per EP  HDL - on Crestor 20 mg  LDL < 100  Doing well- one year f/u       Medication Adjustments/Labs and Tests Ordered: Current medicines are reviewed at length with Thomas patient today.  Concerns regarding medicines are outlined above.  No orders of Thomas defined types were placed in this encounter.  No orders of Thomas defined types were placed in this encounter.   Patient Instructions  Medication Instructions:  Your physician recommends that you continue on your current medications as directed. Please refer to Thomas Current Medication list given to you today.  *If you need a refill on your cardiac medications before your next appointment, please call your pharmacy*   Lab Work: NONE If you have labs (blood work) drawn today and your tests are completely normal, you will receive your results only by: [place] (if you have MyChart) OR A paper copy in Thomas mail If you have any lab test that is abnormal or we need to change your treatment, we will call you to review Thomas results.   Testing/Procedures: NONE   Follow-Up: At [facility], you and your health needs are our priority.  As part of our continuing mission to provide you with exceptional heart care, we have created designated Provider Care Teams.  These Care Teams  include your primary Cardiologist (physician) and Advanced Practice Providers (APPs -  Physician Assistants and Nurse Practitioners) who all work together to provide you with Thomas care you need, when you need it.    Your next appointment:   1 year(s)  Thomas format for your next appointment:   In Person  Provider:   Werner Lean, MD      Important Information About Sugar         Signed, Thomas Lean, MD  01/29/2022 8:19 AM    [place]

## 2022-01-29 ENCOUNTER — Ambulatory Visit: Payer: 59 | Attending: Internal Medicine | Admitting: Internal Medicine

## 2022-01-29 ENCOUNTER — Ambulatory Visit: Payer: 59 | Admitting: Internal Medicine

## 2022-01-29 ENCOUNTER — Encounter: Payer: Self-pay | Admitting: Internal Medicine

## 2022-01-29 VITALS — BP 160/70 | HR 58 | Ht 72.0 in | Wt 194.8 lb

## 2022-01-29 DIAGNOSIS — I472 Ventricular tachycardia, unspecified: Secondary | ICD-10-CM

## 2022-01-29 DIAGNOSIS — I428 Other cardiomyopathies: Secondary | ICD-10-CM | POA: Diagnosis not present

## 2022-01-29 DIAGNOSIS — E7800 Pure hypercholesterolemia, unspecified: Secondary | ICD-10-CM

## 2022-01-29 DIAGNOSIS — I493 Ventricular premature depolarization: Secondary | ICD-10-CM

## 2022-01-29 DIAGNOSIS — I5022 Chronic systolic (congestive) heart failure: Secondary | ICD-10-CM

## 2022-01-29 NOTE — Patient Instructions (Signed)
Medication Instructions:  Your physician recommends that you continue on your current medications as directed. Please refer to the Current Medication list given to you today.  *If you need a refill on your cardiac medications before your next appointment, please call your pharmacy*   Lab Work: NONE  If you have labs (blood work) drawn today and your tests are completely normal, you will receive your results only by: MyChart Message (if you have MyChart) OR A paper copy in the mail If you have any lab test that is abnormal or we need to change your treatment, we will call you to review the results.   Testing/Procedures: NONE   Follow-Up: At [facility], you and your health needs are our priority.  As part of our continuing mission to provide you with exceptional heart care, we have created designated Provider Care Teams.  These Care Teams include your primary Cardiologist (physician) and Advanced Practice Providers (APPs -  Physician Assistants and Nurse Practitioners) who all work together to provide you with the care you need, when you need it.   Your next appointment:   1 year(s)  The format for your next appointment:   In Person  Provider:   Mahesh A Chandrasekhar, MD      Important Information About Sugar       

## 2022-02-06 ENCOUNTER — Ambulatory Visit: Payer: 59 | Admitting: Internal Medicine

## 2022-02-12 ENCOUNTER — Encounter: Payer: Self-pay | Admitting: Internal Medicine

## 2022-03-04 ENCOUNTER — Ambulatory Visit (INDEPENDENT_AMBULATORY_CARE_PROVIDER_SITE_OTHER): Payer: 59 | Admitting: Internal Medicine

## 2022-03-04 VITALS — BP 114/66 | HR 54 | Temp 97.7°F | Ht 72.0 in | Wt 189.0 lb

## 2022-03-04 DIAGNOSIS — E559 Vitamin D deficiency, unspecified: Secondary | ICD-10-CM

## 2022-03-04 DIAGNOSIS — K140 Glossitis: Secondary | ICD-10-CM | POA: Diagnosis not present

## 2022-03-04 DIAGNOSIS — E7800 Pure hypercholesterolemia, unspecified: Secondary | ICD-10-CM | POA: Diagnosis not present

## 2022-03-04 DIAGNOSIS — R739 Hyperglycemia, unspecified: Secondary | ICD-10-CM | POA: Diagnosis not present

## 2022-03-04 MED ORDER — NYSTATIN 100000 UNIT/ML MT SUSP: 500000.0000 [IU] | Freq: Four times a day (QID) | OROMUCOSAL | 1 refills | Status: AC

## 2022-03-04 NOTE — Patient Instructions (Signed)
Please take all new medication as prescribed  - the nystatin solution  Please continue all other medications as before, and refills have been done if requested.  Please have the pharmacy call with any other refills you may need.  Please keep your appointments with your specialists as you may have planned

## 2022-03-04 NOTE — Progress Notes (Signed)
Patient ID: Thomas Caldwell, male   DOB: [birth year], 77 y.o.   MRN: [id number]        Chief Complaint: follow up tongue pain cracks and redness x 2 wks,, dm, low vit d        HPI:  Thomas Caldwell is a 77 y.o. male here with c/o above x 2 wks, very irritated, not better with baking soda his wife suggested, very sore to touch, no drainage or fever, but some swelling and redness.  Pt denies chest pain, increased sob or doe, wheezing, orthopnea, PND, increased LE swelling, palpitations, dizziness or syncope.   Pt denies polydipsia, polyuria, or new focal neuro s/s.         Wt Readings from Last 3 Encounters:  03/04/22 189 lb (85.7 kg)  01/29/22 194 lb 12.8 oz (88.4 kg)  12/25/21 191 lb (86.6 kg)   BP Readings from Last 3 Encounters:  03/04/22 114/66  01/29/22 (!) 160/70  12/25/21 126/80         Past Medical History:  Diagnosis Date   ALLERGIC RHINITIS 01/11/2008   ANEURYSM 02/02/2007   brain   BRADYCARDIA, CHRONIC 10/06/2007   DIVERTICULOSIS, COLON 07/05/2007   EUSTACHIAN TUBE DYSFUNCTION 04/06/2009   GERD 07/05/2007   HYPERLIPIDEMIA 02/02/2007   HYPERTENSION 02/02/2007   Lumbago 01/11/2008   Peptic ulcer    PERSISTENT DISORDER INITIATING/MAINTAINING SLEEP 02/02/2007   Past Surgical History:  Procedure Laterality Date   APPENDECTOMY  1982   LEFT HEART CATH AND CORONARY ANGIOGRAPHY N/A 10/04/2019   Procedure: LEFT HEART CATH AND CORONARY ANGIOGRAPHY;  Surgeon: Troy Sine, MD;  Location: [facility];  Service: Cardiovascular;  Laterality: N/A;   LUMBAR DISC SURGERY  12/2005   Dr. Vertell Limber   [facility] Left brain  1992   due to brain aneurysm s/p repair    reports that he has never smoked. He has never used smokeless tobacco. He reports current alcohol use. He reports that he does not use drugs. family history includes Esophageal cancer in his brother; Heart disease in his father and sister; Hypertension in his brother and father. Allergies  Allergen Reactions   Codeine Other (See  Comments)    Confusion   Eplerenone Other (See Comments)    Breast tenderness   Spironolactone Other (See Comments)    Breast tenderness   Current Outpatient Medications on File Prior to Visit  Medication Sig Dispense Refill   aspirin 81 MG EC tablet Take 81 mg by mouth daily.     dronedarone (MULTAQ) 400 MG tablet Take 1 tablet (400 mg total) by mouth 2 (two) times daily with a meal. 60 tablet 11   empagliflozin (JARDIANCE) 10 MG TABS tablet Take 1 tablet (10 mg total) by mouth daily before breakfast. 90 tablet 3   guaiFENesin (MUCINEX) 600 MG 12 hr tablet Take 2 tablets (1,200 mg total) by mouth 2 (two) times daily as needed. 60 tablet 1   metoprolol succinate (TOPROL XL) 25 MG 24 hr tablet Take 1 tablet (25 mg total) by mouth daily. 90 tablet 3   rosuvastatin (CRESTOR) 20 MG tablet TAKE 1 TABLET(20 MG) BY MOUTH DAILY 90 tablet 3   sacubitril-valsartan (ENTRESTO) 97-103 MG Take 1 tablet by mouth 2 (two) times daily. 180 tablet 3   No current facility-administered medications on file prior to visit.        ROS:  All others reviewed and negative.  Objective        PE:  BP 114/66 (BP Location:  Right Arm, Patient Position: Sitting, Cuff Size: Large)   Pulse (!) 54   Temp 97.7 F (36.5 C) (Oral)   Ht 6' (1.829 m)   Wt 189 lb (85.7 kg)   SpO2 96%   BMI 25.63 kg/m                 Constitutional: Pt appears in NAD               HENT: Head: NCAT.                Right Ear: External ear normal.                 Left Ear: External ear normal.                Eyes: . Pupils are equal, round, and reactive to light. Conjunctivae and EOM are normal;   tongue with 1+ swelling diffuse, deep midline ridges and several smaller to sides with diffuse erythema               Nose: without d/c or deformity               Neck: Neck supple. Gross normal ROM               Cardiovascular: Normal rate and regular rhythm.                 Pulmonary/Chest: Effort normal and breath sounds without rales or  wheezing.                Abd:  Soft, NT, ND, + BS, no organomegaly               Neurological: Pt is alert. At baseline orientation, motor grossly intact               Skin: Skin is warm. No rashes, no other new lesions, LE edema - none               Psychiatric: Pt behavior is normal without agitation   Micro: none  Cardiac tracings I have personally interpreted today:  none  Pertinent Radiological findings (summarize): none   Lab Results  Component Value Date   WBC 7.7 12/09/2021   HGB 16.0 12/09/2021   HCT 48.3 12/09/2021   PLT 220.0 12/09/2021   GLUCOSE 111 (H) 12/09/2021   CHOL 142 12/09/2021   TRIG 107.0 12/09/2021   HDL 48.60 12/09/2021   LDLCALC 72 12/09/2021   ALT 20 12/09/2021   AST 21 12/09/2021   NA 138 12/09/2021   K 5.1 12/09/2021   CL 105 12/09/2021   CREATININE 1.34 12/09/2021   BUN 22 12/09/2021   CO2 28 12/09/2021   TSH 3.25 12/09/2021   PSA 2.23 12/09/2021   HGBA1C 5.8 12/09/2021   Assessment/Plan:  Thomas Caldwell is a 77 y.o. White or Caucasian [1] male with  has a past medical history of ALLERGIC RHINITIS (01/11/2008), ANEURYSM (02/02/2007), BRADYCARDIA, CHRONIC (10/06/2007), DIVERTICULOSIS, COLON (07/05/2007), EUSTACHIAN TUBE DYSFUNCTION (04/06/2009), GERD (07/05/2007), HYPERLIPIDEMIA (02/02/2007), HYPERTENSION (02/02/2007), Lumbago (01/11/2008), Peptic ulcer, and PERSISTENT DISORDER INITIATING/MAINTAINING SLEEP (02/02/2007).  Vitamin D deficiency Last vitamin D Lab Results  Component Value Date   VD25OH 26.12 (L) 12/09/2021   Low, to start oral replacement   Hyperglycemia Lab Results  Component Value Date   HGBA1C 5.8 12/09/2021   Stable, pt to continue current medical treatment jardiance 10 mg qd   Glossitis Mod to severe, for nystatin soln prn qid  Pure hypercholesterolemia Lab Results  Component Value Date   LDLCALC 72 12/09/2021   Uncontrolled, pt to continue current statin crestor 20 mg and lower chol diet, o/w declines  change  Followup: Return if symptoms worsen or fail to improve.  [name], MD 03/07/2022 3:33 PM [facility]

## 2022-03-07 ENCOUNTER — Encounter: Payer: Self-pay | Admitting: Internal Medicine

## 2022-03-07 NOTE — Assessment & Plan Note (Signed)
Lab Results  Component Value Date   HGBA1C 5.8 12/09/2021   Stable, pt to continue current medical treatment jardiance 10 mg qd

## 2022-03-07 NOTE — Assessment & Plan Note (Signed)
Lab Results  Component Value Date   LDLCALC 72 12/09/2021   Uncontrolled, pt to continue current statin crestor 20 mg and lower chol diet, o/w declines change

## 2022-03-07 NOTE — Assessment & Plan Note (Signed)
Last vitamin D Lab Results  Component Value Date   VD25OH 26.12 (L) 12/09/2021   Low, to start oral replacement

## 2022-03-07 NOTE — Assessment & Plan Note (Signed)
Mod to severe, for nystatin soln prn qid

## 2022-03-31 ENCOUNTER — Encounter: Payer: Self-pay | Admitting: Internal Medicine

## 2022-03-31 DIAGNOSIS — K146 Glossodynia: Secondary | ICD-10-CM

## 2022-03-31 DIAGNOSIS — K140 Glossitis: Secondary | ICD-10-CM

## 2022-03-31 NOTE — Telephone Encounter (Signed)
Patient would like to proceed with referral to oral surgery

## 2022-05-18 ENCOUNTER — Other Ambulatory Visit: Payer: Self-pay

## 2022-05-18 ENCOUNTER — Encounter: Payer: Self-pay | Admitting: Family Medicine

## 2022-05-18 ENCOUNTER — Ambulatory Visit: Payer: 59 | Admitting: Family Medicine

## 2022-05-18 VITALS — BP 146/78 | HR 53 | Ht 72.0 in | Wt 189.0 lb

## 2022-05-18 DIAGNOSIS — G8929 Other chronic pain: Secondary | ICD-10-CM

## 2022-05-18 DIAGNOSIS — M25511 Pain in right shoulder: Secondary | ICD-10-CM | POA: Diagnosis not present

## 2022-05-18 NOTE — Patient Instructions (Addendum)
Thank you for coming in today.   I've referred you to Physical Therapy.  Let us know if you don't hear from them in one week.   Recheck in 6 weeks.    

## 2022-05-18 NOTE — Progress Notes (Unsigned)
   Rubin Payor, PhD, LAT, ATC acting as a scribe for [name], MD.  Thomas Caldwell is a 77 y.o. male who presents to [facility] today for shoulder pain.  Patient was previously seen by Dr. Gracelyn Nurse on 12/10/2021 for bilateral knee pain.  Today, pt c/o shoulder pain ongoing x 1 month. Sx started after grabbing Thomas Caldwell when trying to get out of the car at the vet.  Patient locates pain to the top and lateral aspects of the right shoulder. Occasional sharp shooting pains.   Radiates: yes, not beyond the elbow UE Numbness/tingling: no UE Weakness: no Aggravates: driving, reaching overhead, lateral raises Treatments tried: Tylenol Arthritis  Pertinent review of systems: No fevers or chills  Relevant historical information: History of partial quad tendon tears bilaterally   Exam:  BP (!) 146/78   Pulse (!) 53   Ht 6' (1.829 m)   Wt 189 lb (85.7 kg)   SpO2 99%   BMI 25.63 kg/m  General: Well Developed, well nourished, and in no acute distress.   MSK: Right shoulder normal-appearing Normal motion pain with abduction. Strength reduced abduction 4/5. Positive Hawkins and Neer's test. Negative Yergason's and speeds test. Positive empty can test.    Lab and Radiology Results  Diagnostic Limited MSK Ultrasound of: Right shoulder Biceps tendon intact normal-appearing Subscapularis tendon intact without visible retracted tear Supraspinatus tendon appears to be intact without full-thickness retracted tear.  There is some hypoechoic change in the superficial portion of the tendon which could represent partial tear. Infraspinatus tendon appears to be intact. AC joint degenerative. Impression: Possible supraspinatus partial-thickness tear.        Assessment and Plan: 77 y.o. male with right shoulder pain.  This is an acute exacerbation of some chronic pain.  He is a great candidate for physical therapy.  Plan to refer to PT.   Recheck in 6 weeks. Avoid high-dose oral NSAIDs acetaminophen and topical diclofenac are okay.  PDMP not reviewed this encounter. Orders Placed This Encounter  Procedures   [place] LIMITED JOINT SPACE STRUCTURES UP RIGHT(NO LINKED CHARGES)    Order Specific Question:   Reason for Exam (SYMPTOM  OR DIAGNOSIS REQUIRED)    Answer:   right shoulder pain    Order Specific Question:   Preferred imaging location?    Answer:   [facility] referral to Physical Therapy    Referral Priority:   Routine    Referral Type:   Physical Medicine    Referral Reason:   Specialty Services Required    Requested Specialty:   Physical Therapy    Number of Visits Requested:   1   No orders of the defined types were placed in this encounter.    Discussed warning signs or symptoms. Please see discharge instructions. Patient expresses understanding.   The above documentation has been reviewed and is accurate and complete [name], M.D.

## 2022-05-25 ENCOUNTER — Encounter: Payer: Self-pay | Admitting: Physical Therapy

## 2022-05-25 ENCOUNTER — Ambulatory Visit: Payer: 59 | Attending: Family Medicine | Admitting: Physical Therapy

## 2022-05-25 DIAGNOSIS — M25511 Pain in right shoulder: Secondary | ICD-10-CM | POA: Diagnosis present

## 2022-05-25 DIAGNOSIS — G8929 Other chronic pain: Secondary | ICD-10-CM | POA: Insufficient documentation

## 2022-05-25 DIAGNOSIS — M6281 Muscle weakness (generalized): Secondary | ICD-10-CM | POA: Insufficient documentation

## 2022-05-25 NOTE — Therapy (Signed)
OUTPATIENT PHYSICAL THERAPY SHOULDER EVALUATION   Patient Name: Okechukwu Regnier MRN: [id number] DOB:01-May-1945, 77 y.o., male Today's Date: 05/25/2022  END OF SESSION:  PT End of Session - 05/25/22 0757     Visit Number 1    Number of Visits 5    Date for PT Re-Evaluation 08/24/22    Authorization Type [employer]    PT Start Time 0755    PT Stop Time 0841    PT Time Calculation (min) 46 min    Activity Tolerance Patient tolerated treatment well    Behavior During Therapy [facility] for tasks assessed/performed             Past Medical History:  Diagnosis Date   ALLERGIC RHINITIS 01/11/2008   ANEURYSM 02/02/2007   brain   BRADYCARDIA, CHRONIC 10/06/2007   DIVERTICULOSIS, COLON 07/05/2007   EUSTACHIAN TUBE DYSFUNCTION 04/06/2009   GERD 07/05/2007   HYPERLIPIDEMIA 02/02/2007   HYPERTENSION 02/02/2007   Lumbago 01/11/2008   Peptic ulcer    PERSISTENT DISORDER INITIATING/MAINTAINING SLEEP 02/02/2007   Past Surgical History:  Procedure Laterality Date   APPENDECTOMY  1982   LEFT HEART CATH AND CORONARY ANGIOGRAPHY N/A 10/04/2019   Procedure: LEFT HEART CATH AND CORONARY ANGIOGRAPHY;  Surgeon: Lennette Bihari, MD;  Location: [facility];  Service: Cardiovascular;  Laterality: N/A;   LUMBAR DISC SURGERY  12/2005   Dr. 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   PVC's (premature ventricular contractions) 09/24/2021   Pure hypercholesterolemia 09/24/2021   Gynecomastia 06/25/2021   Vitamin D deficiency 12/08/2020   Increased prostate specific antigen (PSA) velocity 12/08/2020   BPH associated with nocturia 12/08/2020   Right otitis media 06/03/2020   Chronic systolic heart  failure (HCC) 91/47/8295   Ventricular tachycardia (HCC)    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   Palpitations 08/11/2019   Trigger finger, right ring finger 01/06/2019   Hyperglycemia 10/17/2018   Patellar subluxation, left, initial encounter 06/13/2018   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/2018   Greater trochanteric bursitis of right hip 04/18/2018   Degenerative disc disease, lumbar 12/29/2016   Right knee pain 11/08/2012   Back pain 06/02/2011    PCP: Oliver Barre, MD  REFERRING PROVIDER: Denyse Amass, MD  REFERRING DIAG: right shoulder pain  THERAPY DIAG:  Acute pain of right shoulder  Muscle weakness (generalized)  Rationale for Evaluation and Treatment: Rehabilitation  ONSET DATE: March 2024  SUBJECTIVE:  SUBJECTIVE STATEMENT: Over a month ago he was trying to get his large dog out of the car and he felt some pain lifting her and then it has gotten worse.  Worst thing is using the computer Hand dominance: Right  PERTINENT HISTORY: See above  PAIN:  Are you having pain? Yes: NPRS scale: 2/10 Pain location: right shoulder Pain description: ache, sharp Aggravating factors: pain up to 6/10 reaching and using it and on the computer a long period of time Relieving factors: uses some creme feels better, at best 1/10  PRECAUTIONS: None  WEIGHT BEARING RESTRICTIONS: No  FALLS:  Has patient fallen in last 6 months? No  LIVING ENVIRONMENT: Lives with: lives with their family Lives in: House/apartment Stairs: No Has following equipment at home: None  OCCUPATION: Still works doing a Financial planner work  PLOF: Airline pilot 3x/week  PATIENT GOALS:less pain  NEXT MD VISIT:   OBJECTIVE:   DIAGNOSTIC FINDINGS:  Diagnostic Limited MSK Ultrasound of: Right  shoulder Biceps tendon intact normal-appearing Subscapularis tendon intact without visible retracted tear Supraspinatus tendon appears to be intact without full-thickness retracted tear.  There is some hypoechoic change in the superficial portion of the tendon which could represent partial tear. Infraspinatus tendon appears to be intact. AC joint degenerative. Impression: Possible supraspinatus partial-thickness tear.  PATIENT SURVEYS:  FOTO 54  COGNITION: Overall cognitive status: Within functional limits for tasks assessed     SENSATION: WFL  POSTURE: Fwd head, rounded shoulders  UPPER EXTREMITY ROM:   Active ROM Right eval Left eval  Shoulder flexion 140 150  Shoulder extension 145 150  Shoulder abduction    Shoulder adduction    Shoulder internal rotation 50 70  Shoulder external rotation 80 90  Elbow flexion    Elbow extension    Wrist flexion    Wrist extension    Wrist ulnar deviation    Wrist radial deviation    Wrist pronation    Wrist supination    (Blank rows = not tested)  UPPER EXTREMITY MMT:  MMT Right eval Left eval  Shoulder flexion 4+ 5  Shoulder extension    Shoulder abduction 4 5  Shoulder adduction    Shoulder internal rotation 4+ 5  Shoulder external rotation 4+ 5  Middle trapezius    Lower trapezius    Elbow flexion    Elbow extension    Wrist flexion    Wrist extension    Wrist ulnar deviation    Wrist radial deviation    Wrist pronation    Wrist supination    Grip strength (lbs)    (Blank rows = not tested)  SHOULDER SPECIAL TESTS: Impingement tests: Neer impingement test: positive  Rotator cuff assessment: Empty can test: positive  Biceps assessment: Yergason's test: negative  PALPATION:  A lot of crepitus with movements of the right shoulder, mild tenderness superior shoulder   TODAY'S TREATMENT:  DATE:    PATIENT EDUCATION: Education details: HEP/POC Person educated: Patient Education method: Programmer, multimedia, Demonstration, Actor cues, Verbal cues, and Handouts Education comprehension: verbalized understanding  HOME EXERCISE PROGRAM: Access Code: [email or web address] URL: [email or web address] Date: 05/25/2022 Prepared by: Stacie Glaze  Exercises - Doorway Pec Stretch at 90 Degrees Abduction  - 2 x daily - 7 x weekly - 1 sets - 5 reps - 10 hold - Standing Shoulder Row with Anchored Resistance  - 2 x daily - 7 x weekly - 1 sets - 10 reps - 3 hold - Shoulder External Rotation and Scapular Retraction with Resistance  - 2 x daily - 7 x weekly - 1 sets - 10 reps - 3 hold - Shoulder extension with resistance - Neutral  - 2 x daily - 7 x weekly - 1 sets - 10 reps - 3 hold  ASSESSMENT:  CLINICAL IMPRESSION: Patient is a 77 y.o. male who was seen today for physical therapy evaluation and treatment for right shoulder pain after lifting his dog out of the car.  He reports that he is using Voltaren gel and it is helping, he has good ROM only slightly limited compared to the left, he is right handed.  Has some pain and weakness compared to the left.  There is a lot of crepitus with motions of the right shoulder.   MD MSK US showed no big issues but could not rule out a tear.  OBJECTIVE IMPAIRMENTS: decreased ROM, decreased strength, increased edema, increased fascial restrictions, impaired perceived functional ability, increased muscle spasms, impaired flexibility, impaired UE functional use, improper body mechanics, postural dysfunction, and pain.    REHAB POTENTIAL: Good  CLINICAL DECISION MAKING: Stable/uncomplicated  EVALUATION COMPLEXITY: Low   GOALS: Goals reviewed with patient? Yes  SHORT TERM GOALS: Target date: 06/08/22  Independent with initial HEP Goal status: INITIAL  LONG TERM GOALS: Target date: 08/24/22  Understand posture and body mechanics Goal  status: INITIAL  2.  Independent with advanced HEP and or gym Goal status: INITIAL  3.  Decrease pain overall 50% Goal status: INITIAL  4.  Increased right shoulder strength to 5/5 Goal status: INITIAL  5.  Increase right shoulder flexion and IR to = the left Goal status: INITIAL  PLAN:  PT FREQUENCY: 1-2x/week  PT DURATION: 12 weeks  PLANNED INTERVENTIONS: Therapeutic exercises, Therapeutic activity, Neuromuscular re-education, Balance training, Gait training, Patient/Family education, Self Care, Joint mobilization, Dry Needling, Electrical stimulation, Cryotherapy, Moist heat, Taping, Vasopneumatic device, Ultrasound, Ionotophoresis 4mg /ml Dexamethasone, and Manual therapy  PLAN FOR NEXT SESSION: his insurance limits visits to 5 up front and if we want more will have to request more.  I gave HEP today and will plan on advancing stability of the shoulder and scapula   Janelle Spellman W, PT 05/25/2022, 7:58 AM

## 2022-06-02 ENCOUNTER — Ambulatory Visit: Payer: 59 | Attending: Family Medicine | Admitting: Physical Therapy

## 2022-06-02 DIAGNOSIS — M6281 Muscle weakness (generalized): Secondary | ICD-10-CM

## 2022-06-02 DIAGNOSIS — M25511 Pain in right shoulder: Secondary | ICD-10-CM

## 2022-06-02 NOTE — Therapy (Signed)
OUTPATIENT PHYSICAL THERAPY SHOULDER    Patient Name: Thomas Caldwell MRN: [id number] DOB:[birth year], 77 y.o., male Today's Date: 06/02/2022  END OF SESSION:  PT End of Session - 06/02/22 0752     Visit Number 2    Number of Visits 5    Date for PT Re-Evaluation 08/24/22    Authorization Type [employer]    PT Start Time 0755    PT Stop Time 0825    PT Time Calculation (min) 30 min             Past Medical History:  Diagnosis Date   ALLERGIC RHINITIS 01/11/2008   ANEURYSM 02/02/2007   brain   BRADYCARDIA, CHRONIC 10/06/2007   DIVERTICULOSIS, COLON 07/05/2007   EUSTACHIAN TUBE DYSFUNCTION 04/06/2009   GERD 07/05/2007   HYPERLIPIDEMIA 02/02/2007   HYPERTENSION 02/02/2007   Lumbago 01/11/2008   Peptic ulcer    PERSISTENT DISORDER INITIATING/MAINTAINING SLEEP 02/02/2007   Past Surgical History:  Procedure Laterality Date   APPENDECTOMY  1982   LEFT HEART CATH AND CORONARY ANGIOGRAPHY N/A 10/04/2019   Procedure: LEFT HEART CATH AND CORONARY ANGIOGRAPHY;  Surgeon: Lennette Bihari, MD;  [facility]: [facility];  Service: Cardiovascular;  Laterality: N/A;   LUMBAR DISC SURGERY  12/2005   Dr. Venetia Maxon   [facility] Left brain  1992   due to brain aneurysm s/p repair   Patient Active Problem List   Diagnosis Date Noted   Glossitis 03/04/2022   Non-ischemic cardiomyopathy (HCC) 12/25/2021   Encounter for well adult exam with abnormal findings 12/10/2021   Allergic rhinitis 12/10/2021   Left ventricular non-compaction cardiomyopathy (HCC) 09/24/2021   PVC's (premature ventricular contractions) 09/24/2021   Pure hypercholesterolemia 09/24/2021   Gynecomastia 06/25/2021   Vitamin D deficiency 12/08/2020   Increased prostate specific antigen (PSA) velocity 12/08/2020   BPH associated with nocturia 12/08/2020   Right otitis media 06/03/2020   Chronic systolic heart failure (HCC) 04/08/2020   Ventricular tachycardia (HCC)    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   Palpitations 08/11/2019    Trigger finger, right ring finger 01/06/2019   Hyperglycemia 10/17/2018   Patellar subluxation, left, initial encounter 06/13/2018   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/2018   Greater trochanteric bursitis of right hip 04/18/2018   Degenerative disc disease, lumbar 12/29/2016   Right knee pain 11/08/2012   Back pain 06/02/2011    PCP: Oliver Barre, MD  REFERRING PROVIDER: Denyse Amass, MD  REFERRING DIAG: right shoulder pain  THERAPY DIAG:  Acute pain of right shoulder  Muscle weakness (generalized)  Rationale for Evaluation and Treatment: Rehabilitation  ONSET DATE: March 2024  SUBJECTIVE:  SUBJECTIVE STATEMENT: Status quo, ex are fine. Only thing that really bothers me is sitting working on computer PERTINENT HISTORY: See above  PAIN:  Are you having pain? Yes: NPRS scale: 2/10 Pain location: right shoulder Pain description: ache, sharp Aggravating factors: pain up to 6/10 reaching and using it and on the computer a long period of time Relieving factors: uses some creme feels better, at best 1/10  PRECAUTIONS: None  WEIGHT BEARING RESTRICTIONS: No  FALLS:  Has patient fallen in last 6 months? No  LIVING ENVIRONMENT: Lives with: lives with their family Lives in: House/apartment Stairs: No Has following equipment at home: None  OCCUPATION: Still works doing a Financial planner work  PLOF: Airline pilot 3x/week  PATIENT GOALS:less pain  NEXT MD VISIT:   OBJECTIVE:   DIAGNOSTIC FINDINGS:  Diagnostic Limited MSK Ultrasound of: Right shoulder Biceps tendon intact normal-appearing Subscapularis tendon intact without visible retracted tear Supraspinatus tendon appears to be intact without full-thickness retracted tear.  There is some hypoechoic change in the superficial  portion of the tendon which could represent partial tear. Infraspinatus tendon appears to be intact. AC joint degenerative. Impression: Possible supraspinatus partial-thickness tear.  PATIENT SURVEYS:  FOTO 54  COGNITION: Overall cognitive status: Within functional limits for tasks assessed     SENSATION: WFL  POSTURE: Fwd head, rounded shoulders  UPPER EXTREMITY ROM:   Active ROM Right eval Left eval RT 06/02/22  Shoulder flexion 140 150 160  Shoulder extension 145 150 165  Shoulder abduction     Shoulder adduction     Shoulder internal rotation 50 70   Shoulder external rotation 80 90   Elbow flexion     Elbow extension     Wrist flexion     Wrist extension     Wrist ulnar deviation     Wrist radial deviation     Wrist pronation     Wrist supination     (Blank rows = not tested)  UPPER EXTREMITY MMT:  MMT Right eval Left eval  Shoulder flexion 4+ 5  Shoulder extension    Shoulder abduction 4 5  Shoulder adduction    Shoulder internal rotation 4+ 5  Shoulder external rotation 4+ 5  Middle trapezius    Lower trapezius    Elbow flexion    Elbow extension    Wrist flexion    Wrist extension    Wrist ulnar deviation    Wrist radial deviation    Wrist pronation    Wrist supination    Grip strength (lbs)    (Blank rows = not tested)  SHOULDER SPECIAL TESTS: Impingement tests: Neer impingement test: positive  Rotator cuff assessment: Empty can test: positive  Biceps assessment: Yergason's test: negative  PALPATION:  A lot of crepitus with movements of the right shoulder, mild tenderness superior shoulder   TODAY'S TREATMENT:  DATE:   06/02/22 UBE L 3 3 min each way- while on UBE educ on desk ergonomics and supporting arm to decrease stress/ strain and shld pain 3# shld flex into abd then lower 10 x then # shld abd into  flex then lower BIL RT shld empty can and ER 2 sets 10 3# 3# 4 pt rhy stab on wall 10 x each 5# seated row, horz abd and shld ext 2 sets 10 Progressed HEP   PATIENT EDUCATION: Education details: HEP/POC Person educated: Patient Education method: Programmer, multimedia, Demonstration, Actor cues, Verbal cues, and Handouts Education comprehension: verbalized understanding  HOME EXERCISE PROGRAM:  06/02/22 Access Code: [email or web address] URL: [email or web address] Date: 06/02/2022 Prepared by: Keriana Sarsfield  Exercises - Standing Shoulder Flexion to 90 Degrees  - 1 x daily - 7 x weekly - 1 sets - 15 reps - 3-5# hold - Shoulder Abduction - Thumbs Up  - 1 x daily - 7 x weekly - 1 sets - 15 reps - 3-5# hold - Shoulder PNF D2 Flexion  - 1 x daily - 7 x weekly - 1 sets - 15 reps - 3-5# hold - Seated Shoulder Empty Can with Dumbbells  - 1 x daily - 7 x weekly - 1 sets - 15 reps - 3-5# hold - Shoulder External Rotation with Anchored Resistance  - 1 x daily - 7 x weekly - 1 sets - 15 reps - Shoulder Internal Rotation with Resistance  - 1 x daily - 7 x weekly - 1 sets - 15 reps  Access Code: [email or web address] URL: [email or web address] Date: 05/25/2022 Prepared by: Stacie Glaze  Exercises - Doorway Pec Stretch at 90 Degrees Abduction  - 2 x daily - 7 x weekly - 1 sets - 5 reps - 10 hold - Standing Shoulder Row with Anchored Resistance  - 2 x daily - 7 x weekly - 1 sets - 10 reps - 3 hold - Shoulder External Rotation and Scapular Retraction with Resistance  - 2 x daily - 7 x weekly - 1 sets - 10 reps - 3 hold - Shoulder extension with resistance - Neutral  - 2 x daily - 7 x weekly - 1 sets - 10 reps - 3 hold  ASSESSMENT:  CLINICAL IMPRESSION: Pt arrives status quo ,states doing HEP. Progressed HEP , educ on posture and desk ergonomics. STG met OBJECTIVE IMPAIRMENTS: decreased ROM, decreased strength, increased edema, increased fascial restrictions, impaired perceived functional  ability, increased muscle spasms, impaired flexibility, impaired UE functional use, improper body mechanics, postural dysfunction, and pain.    REHAB POTENTIAL: Good  CLINICAL DECISION MAKING: Stable/uncomplicated  EVALUATION COMPLEXITY: Low   GOALS: Goals reviewed with patient? Yes  SHORT TERM GOALS: Target date: 06/08/22  Independent with initial HEP Goal status: MET 06/02/22  LONG TERM GOALS: Target date: 08/24/22  Understand posture and body mechanics Goal status: INITIAL  2.  Independent with advanced HEP and or gym Goal status: INITIAL  3.  Decrease pain overall 50% Goal status: INITIAL  4.  Increased right shoulder strength to 5/5 Goal status: INITIAL  5.  Increase right shoulder flexion and IR to = the left Goal status: INITIAL  PLAN:  PT FREQUENCY: 1-2x/week  PT DURATION: 12 weeks  PLANNED INTERVENTIONS: Therapeutic exercises, Therapeutic activity, Neuromuscular re-education, Balance training, Gait training, Patient/Family education, Self Care, Joint mobilization, Dry Needling, Electrical stimulation, Cryotherapy, Moist heat, Taping, Vasopneumatic device, Ultrasound, Ionotophoresis 4mg /ml Dexamethasone, and Manual therapy  PLAN FOR NEXT SESSION: his insurance limits visits to 5 up  front and if we want more will have to request more.  I gave HEP today and will plan on advancing stability of the shoulder and scapula   Ronniesha Seibold,ANGIE, PTA 06/02/2022, 8:19 AM [facility] at [facility] [address] [facility] [address] [facility] [address] [place], [place], [address] Phone: [phone]   Fax:  [phone]  Patient Details  Name: Algenis Frere MRN: [id number] Date of Birth: 03/18/1945 Referring Provider:  Corwin Levins, MD  Encounter Date: 06/02/2022   Suanne Marker, PTA 06/02/2022, 8:19 AM  [place] [facility] at [facility] [address] [facility] [address] [place], [place], [address] Phone: [phone]   Fax:  [phone]

## 2022-06-04 ENCOUNTER — Ambulatory Visit (INDEPENDENT_AMBULATORY_CARE_PROVIDER_SITE_OTHER): Payer: 59 | Admitting: Internal Medicine

## 2022-06-04 ENCOUNTER — Encounter: Payer: Self-pay | Admitting: Internal Medicine

## 2022-06-04 VITALS — BP 122/78 | HR 55 | Temp 98.3°F | Ht 72.0 in | Wt 186.0 lb

## 2022-06-04 DIAGNOSIS — J309 Allergic rhinitis, unspecified: Secondary | ICD-10-CM | POA: Diagnosis not present

## 2022-06-04 DIAGNOSIS — Z0001 Encounter for general adult medical examination with abnormal findings: Secondary | ICD-10-CM

## 2022-06-04 DIAGNOSIS — E7800 Pure hypercholesterolemia, unspecified: Secondary | ICD-10-CM

## 2022-06-04 DIAGNOSIS — R739 Hyperglycemia, unspecified: Secondary | ICD-10-CM

## 2022-06-04 DIAGNOSIS — K5900 Constipation, unspecified: Secondary | ICD-10-CM | POA: Diagnosis not present

## 2022-06-04 DIAGNOSIS — Z125 Encounter for screening for malignant neoplasm of prostate: Secondary | ICD-10-CM | POA: Diagnosis not present

## 2022-06-04 DIAGNOSIS — E559 Vitamin D deficiency, unspecified: Secondary | ICD-10-CM

## 2022-06-04 DIAGNOSIS — E538 Deficiency of other specified B group vitamins: Secondary | ICD-10-CM | POA: Diagnosis not present

## 2022-06-04 LAB — URINALYSIS, ROUTINE W REFLEX MICROSCOPIC
Bilirubin Urine: NEGATIVE
Hgb urine dipstick: NEGATIVE
Ketones, ur: NEGATIVE
Leukocytes,Ua: NEGATIVE
Nitrite: NEGATIVE
RBC / HPF: NONE SEEN (ref 0–?)
Specific Gravity, Urine: <=1.005 — AB (ref 1.000–1.030)
Total Protein, Urine: NEGATIVE
Urine Glucose: >=1000 — AB
Urobilinogen, UA: 0.2 (ref 0.0–1.0)
pH: 6.5 (ref 5.0–8.0)

## 2022-06-04 LAB — CBC WITH DIFFERENTIAL/PLATELET
Basophils Absolute: 0.1 10*3/uL (ref 0.0–0.1)
Basophils Relative: 0.6 % (ref 0.0–3.0)
Eosinophils Absolute: 0.1 10*3/uL (ref 0.0–0.7)
Eosinophils Relative: 1.2 % (ref 0.0–5.0)
HCT: 50.6 % (ref 39.0–52.0)
Hemoglobin: 17 g/dL (ref 13.0–17.0)
Lymphocytes Relative: 19.5 % (ref 12.0–46.0)
Lymphs Abs: 1.7 10*3/uL (ref 0.7–4.0)
MCHC: 33.6 g/dL (ref 30.0–36.0)
MCV: 89.3 fl (ref 78.0–100.0)
Monocytes Absolute: 1 10*3/uL (ref 0.1–1.0)
Monocytes Relative: 11.2 % (ref 3.0–12.0)
Neutro Abs: 5.8 10*3/uL (ref 1.4–7.7)
Neutrophils Relative %: 67.5 % (ref 43.0–77.0)
Platelets: 237 10*3/uL (ref 150.0–400.0)
RBC: 5.67 Mil/uL (ref 4.22–5.81)
RDW: 14 % (ref 11.5–15.5)
WBC: 8.5 10*3/uL (ref 4.0–10.5)

## 2022-06-04 LAB — LIPID PANEL
Cholesterol: 152 mg/dL (ref 0–200)
HDL: 50.3 mg/dL (ref >39.00)
LDL Cholesterol: 76 mg/dL (ref 0–99)
NonHDL: 101.47
Total CHOL/HDL Ratio: 3
Triglycerides: 125 mg/dL (ref 0.0–149.0)
VLDL: 25 mg/dL (ref 0.0–40.0)

## 2022-06-04 LAB — HEPATIC FUNCTION PANEL
ALT: 23 U/L (ref 0–53)
AST: 23 U/L (ref 0–37)
Albumin: 4.1 g/dL (ref 3.5–5.2)
Alkaline Phosphatase: 62 U/L (ref 39–117)
Bilirubin, Direct: 0.1 mg/dL (ref 0.0–0.3)
Total Bilirubin: 0.7 mg/dL (ref 0.2–1.2)
Total Protein: 6.8 g/dL (ref 6.0–8.3)

## 2022-06-04 LAB — BASIC METABOLIC PANEL
BUN: 19 mg/dL (ref 6–23)
CO2: 30 mEq/L (ref 19–32)
Calcium: 9.7 mg/dL (ref 8.4–10.5)
Chloride: 103 mEq/L (ref 96–112)
Creatinine, Ser: 1.39 mg/dL (ref 0.40–1.50)
GFR: 49.34 mL/min — ABNORMAL LOW (ref >60.00)
Glucose, Bld: 83 mg/dL (ref 70–99)
Potassium: 4.9 mEq/L (ref 3.5–5.1)
Sodium: 140 mEq/L (ref 135–145)

## 2022-06-04 LAB — TSH: TSH: 2.76 u[IU]/mL (ref 0.35–5.50)

## 2022-06-04 LAB — VITAMIN B12: Vitamin B-12: 255 pg/mL (ref 211–911)

## 2022-06-04 LAB — PSA: PSA: 2.6 ng/mL (ref 0.10–4.00)

## 2022-06-04 LAB — VITAMIN D 25 HYDROXY (VIT D DEFICIENCY, FRACTURES): VITD: 24.46 ng/mL — ABNORMAL LOW (ref 30.00–100.00)

## 2022-06-04 LAB — HEMOGLOBIN A1C: Hgb A1c MFr Bld: 5.7 % (ref 4.6–6.5)

## 2022-06-04 MED ORDER — TRULANCE 3 MG PO TABS: ORAL_TABLET | ORAL | 3 refills | Status: DC

## 2022-06-04 NOTE — Patient Instructions (Addendum)
Please remember to have your second shingles shot at the pharmacy  You have the Trulance sample to take at 3 mg per day  Please take all new medication as prescribed - the trulance if not too expensive  Please also restart the allergy medication, and take mucinex 600 - 1200 mg twice per day to unclog the ears  Please continue all other medications as before, and refills have been done if requested.  Please have the pharmacy call with any other refills you may need.  Please continue your efforts at being more active, low cholesterol diet, and weight control.  You are otherwise up to date with prevention measures today.  Please keep your appointments with your specialists as you may have planned  Please go to the LAB at the blood drawing area for the tests to be done  You will be contacted by phone if any changes need to be made immediately.  Otherwise, you will receive a letter about your results with an explanation, but please check with MyChart first.  Please remember to sign up for MyChart if you have not done so, as this will be important to you in the future with finding out test results, communicating by private email, and scheduling acute appointments online when needed.  Please make an Appointment to return in 6 months, or sooner if needed

## 2022-06-04 NOTE — Progress Notes (Signed)
Patient ID: Thomas Caldwell, male   DOB: 1946-01-20, 77 y.o.   MRN: [id number]         Chief Complaint:: wellness exam and Abdominal Pain (Abdomen pain started a couple months ago stabbing pain in lower abdomen , and right ear pain started a couple days ago thinks it may be a ear infection)  , allergies, hyperglycemia, low vit d, hld       HPI:  Thomas Caldwell is a 77 y.o. male here for wellness exam; for shingrix at pharmacy, o/w up to date                        Also Does have several wks ongoing nasal allergy symptoms with clearish congestion, itch and sneezing, without fever, pain, ST, cough, swelling or wheezing, but has also had worsening right ear pain wondering about infection.  Also left ear popping and crackling.  Also with worsening 1-2 wks constipation with left pelvic pain obviously better with BM, just very uncomfortable and not helped with stool softner.  No recent diet or exercise changes.     Wt Readings from Last 3 Encounters:  06/04/22 186 lb (84.4 kg)  05/18/22 189 lb (85.7 kg)  03/04/22 189 lb (85.7 kg)   BP Readings from Last 3 Encounters:  06/04/22 122/78  05/18/22 (!) 146/78  03/04/22 114/66   Immunization History  Administered Date(s) Administered   Fluad Quad(high Dose 65+) 10/17/2018, 11/29/2019, 12/10/2021   Influenza Whole 11/28/2008   Influenza, High Dose Seasonal PF 04/18/2015   Influenza,inj,Quad PF,6+ Mos 01/02/2013, 09/28/2013   Influenza-Unspecified 10/24/2020   Moderna SARS-COV2 Booster Vaccination 11/27/2019, 02/02/2020   Moderna Sars-Covid-2 Vaccination 02/17/2019, 03/15/2019   Pneumococcal Conjugate-13 06/29/2013   Pneumococcal Polysaccharide-23 05/01/2011   Td 07/09/2008   Tdap 10/17/2018   Zoster Recombinat (Shingrix) 12/03/2020   Zoster, Live 07/05/2007   Health Maintenance Due  Topic Date Due   Zoster Vaccines- Shingrix (2 of 2) 01/28/2021      Past Medical History:  Diagnosis Date   ALLERGIC RHINITIS 01/11/2008   ANEURYSM  02/02/2007   brain   BRADYCARDIA, CHRONIC 10/06/2007   DIVERTICULOSIS, COLON 07/05/2007   EUSTACHIAN TUBE DYSFUNCTION 04/06/2009   GERD 07/05/2007   HYPERLIPIDEMIA 02/02/2007   HYPERTENSION 02/02/2007   Lumbago 01/11/2008   Peptic ulcer    PERSISTENT DISORDER INITIATING/MAINTAINING SLEEP 02/02/2007   Past Surgical History:  Procedure Laterality Date   APPENDECTOMY  1982   LEFT HEART CATH AND CORONARY ANGIOGRAPHY N/A 10/04/2019   Procedure: LEFT HEART CATH AND CORONARY ANGIOGRAPHY;  Surgeon: Lennette Bihari, MD;  Location: [facility];  Service: Cardiovascular;  Laterality: N/A;   LUMBAR DISC SURGERY  12/2005   Dr. Venetia Maxon   [facility] Left brain  1992   due to brain aneurysm s/p repair    reports that he has never smoked. He has never used smokeless tobacco. He reports current alcohol use. He reports that he does not use drugs. family history includes Esophageal cancer in his brother; Heart disease in his father and sister; Hypertension in his brother and father. Allergies  Allergen Reactions   Codeine Other (See Comments)    Confusion   Eplerenone Other (See Comments)    Breast tenderness   Spironolactone Other (See Comments)    Breast tenderness   Current Outpatient Medications on File Prior to Visit  Medication Sig Dispense Refill   aspirin 81 MG EC tablet Take 81 mg by mouth daily.  dexamethasone (DECADRON) 0.5 MG/5ML solution Take by mouth.     dronedarone (MULTAQ) 400 MG tablet Take 1 tablet (400 mg total) by mouth 2 (two) times daily with a meal. 60 tablet 11   empagliflozin (JARDIANCE) 10 MG TABS tablet Take 1 tablet (10 mg total) by mouth daily before breakfast. 90 tablet 3   guaiFENesin (MUCINEX) 600 MG 12 hr tablet Take 2 tablets (1,200 mg total) by mouth 2 (two) times daily as needed. 60 tablet 1   metoprolol succinate (TOPROL XL) 25 MG 24 hr tablet Take 1 tablet (25 mg total) by mouth daily. 90 tablet 3   rosuvastatin (CRESTOR) 20 MG tablet TAKE 1 TABLET(20 MG) BY MOUTH DAILY  90 tablet 3   sacubitril-valsartan (ENTRESTO) 97-103 MG Take 1 tablet by mouth 2 (two) times daily. 180 tablet 3   No current facility-administered medications on file prior to visit.        ROS:  All others reviewed and negative.  Objective        PE:  BP 122/78 (BP Location: Left Arm, Patient Position: Sitting, Cuff Size: Normal)   Pulse (!) 55   Temp 98.3 F (36.8 C) (Oral)   Ht 6' (1.829 m)   Wt 186 lb (84.4 kg)   SpO2 97%   BMI 25.23 kg/m                 Constitutional: Pt appears in NAD               HENT: Head: NCAT.                Right Ear: External ear normal.                 Left Ear: External ear normal. Bilat tm's with mild erythema.  Max sinus areas non tender.  Pharynx with mild erythema, no exudate               Eyes: . Pupils are equal, round, and reactive to light. Conjunctivae and EOM are normal               Nose: without d/c or deformity               Neck: Neck supple. Gross normal ROM               Cardiovascular: Normal rate and regular rhythm.                 Pulmonary/Chest: Effort normal and breath sounds without rales or wheezing.                Abd:  Soft, NT, ND, + BS, no organomegaly               Neurological: Pt is alert. At baseline orientation, motor grossly intact               Skin: Skin is warm. No rashes, no other new lesions, LE edema - none               Psychiatric: Pt behavior is normal without agitation   Micro: none  Cardiac tracings I have personally interpreted today:  none  Pertinent Radiological findings (summarize): none   Lab Results  Component Value Date   WBC 8.5 06/04/2022   HGB 17.0 06/04/2022   HCT 50.6 06/04/2022   PLT 237.0 06/04/2022   GLUCOSE 83 06/04/2022   CHOL 152 06/04/2022   TRIG 125.0 06/04/2022   HDL 50.30  06/04/2022   LDLCALC 76 06/04/2022   ALT 23 06/04/2022   AST 23 06/04/2022   NA 140 06/04/2022   K 4.9 06/04/2022   CL 103 06/04/2022   CREATININE 1.39 06/04/2022   BUN 19 06/04/2022   CO2 30  06/04/2022   TSH 2.76 06/04/2022   PSA 2.60 06/04/2022   HGBA1C 5.7 06/04/2022   Assessment/Plan:  Thomas Caldwell is a 77 y.o. White or Caucasian [1] male with  has a past medical history of ALLERGIC RHINITIS (01/11/2008), ANEURYSM (02/02/2007), BRADYCARDIA, CHRONIC (10/06/2007), DIVERTICULOSIS, COLON (07/05/2007), EUSTACHIAN TUBE DYSFUNCTION (04/06/2009), GERD (07/05/2007), HYPERLIPIDEMIA (02/02/2007), HYPERTENSION (02/02/2007), Lumbago (01/11/2008), Peptic ulcer, and PERSISTENT DISORDER INITIATING/MAINTAINING SLEEP (02/02/2007).  Encounter for well adult exam with abnormal findings Age and sex appropriate education and counseling updated with regular exercise and diet Referrals for preventative services - none needed Immunizations addressed - for shingrix at pharmacy Smoking counseling  - none needed Evidence for depression or other mood disorder - none significant Most recent labs reviewed. I have personally reviewed and have noted: 1) the patient's medical and social history 2) The patient's current medications and supplements 3) The patient's height, weight, and BMI have been recorded in the chart   Hyperglycemia Lab Results  Component Value Date   HGBA1C 5.7 06/04/2022   Stable, pt to continue current medical treatment jardiance 25 mg qd   Vitamin D deficiency Last vitamin D Lab Results  Component Value Date   VD25OH 24.46 (L) 06/04/2022   Low, to start oral replacement   Pure hypercholesterolemia Lab Results  Component Value Date   LDLCALC 76 06/04/2022   uncontrolled, pt to continue current statin crestor 20 qd   Constipation Moderate recurrent with left pelvic pain intermittent - for trulance samples and call for rx if helps  Allergic rhinitis Also with right ear pain with benign exam - likely eustachian dysfxn - for mucinec bid prn  Followup: No follow-ups on file.  Oliver Barre, MD 06/08/2022 12:57 PM [facility]

## 2022-06-04 NOTE — Progress Notes (Signed)
The test results show that your current treatment is OK, as the tests are stable.  Please continue the same plan.  There is no other need for change of treatment or further evaluation based on these results, at this time.  thanks 

## 2022-06-08 NOTE — Assessment & Plan Note (Signed)

## 2022-06-08 NOTE — Assessment & Plan Note (Signed)
Moderate recurrent with left pelvic pain intermittent - for trulance samples and call for rx if helps

## 2022-06-08 NOTE — Assessment & Plan Note (Signed)
Lab Results  Component Value Date   LDLCALC 76 06/04/2022   uncontrolled, pt to continue current statin crestor 20 qd

## 2022-06-08 NOTE — Assessment & Plan Note (Signed)
Last vitamin D Lab Results  Component Value Date   VD25OH 24.46 (L) 06/04/2022   Low, to start oral replacement

## 2022-06-08 NOTE — Assessment & Plan Note (Signed)
Lab Results  Component Value Date   HGBA1C 5.7 06/04/2022   Stable, pt to continue current medical treatment jardiance 25 mg qd

## 2022-06-08 NOTE — Assessment & Plan Note (Signed)
Also with right ear pain with benign exam - likely eustachian dysfxn - for mucinec bid prn

## 2022-06-09 ENCOUNTER — Ambulatory Visit: Payer: 59 | Admitting: Physical Therapy

## 2022-06-11 ENCOUNTER — Ambulatory Visit: Payer: 59 | Admitting: Physical Therapy

## 2022-06-11 DIAGNOSIS — M6281 Muscle weakness (generalized): Secondary | ICD-10-CM

## 2022-06-11 DIAGNOSIS — M25511 Pain in right shoulder: Secondary | ICD-10-CM | POA: Diagnosis not present

## 2022-06-11 NOTE — Therapy (Signed)
OUTPATIENT PHYSICAL THERAPY SHOULDER    Patient Name: Thomas Caldwell MRN: [id number] DOB:03/24/1945, 77 y.o., male Today's Date: 06/11/2022  END OF SESSION:  PT End of Session - 06/11/22 1525     Visit Number 3    Number of Visits 5    Date for PT Re-Evaluation 08/24/22    PT Start Time 1530    PT Stop Time 1600    PT Time Calculation (min) 30 min             Past Medical History:  Diagnosis Date   ALLERGIC RHINITIS 01/11/2008   ANEURYSM 02/02/2007   brain   BRADYCARDIA, CHRONIC 10/06/2007   DIVERTICULOSIS, COLON 07/05/2007   EUSTACHIAN TUBE DYSFUNCTION 04/06/2009   GERD 07/05/2007   HYPERLIPIDEMIA 02/02/2007   HYPERTENSION 02/02/2007   Lumbago 01/11/2008   Peptic ulcer    PERSISTENT DISORDER INITIATING/MAINTAINING SLEEP 02/02/2007   Past Surgical History:  Procedure Laterality Date   APPENDECTOMY  1982   LEFT HEART CATH AND CORONARY ANGIOGRAPHY N/A 10/04/2019   Procedure: LEFT HEART CATH AND CORONARY ANGIOGRAPHY;  Surgeon: Lennette Bihari, MD;  [facility];  Service: Cardiovascular;  Laterality: N/A;   LUMBAR DISC SURGERY  12/2005   Dr. Venetia Maxon   [facility] Left brain  1992   due to brain aneurysm s/p repair   Patient Active Problem List   Diagnosis Date Noted   Constipation 06/04/2022   Glossitis 03/04/2022   Non-ischemic cardiomyopathy (HCC) 12/25/2021   Encounter for well adult exam with abnormal findings 12/10/2021   Allergic rhinitis 12/10/2021   Left ventricular non-compaction cardiomyopathy (HCC) 09/24/2021   PVC's (premature ventricular contractions) 09/24/2021   Pure hypercholesterolemia 09/24/2021   Gynecomastia 06/25/2021   Vitamin D deficiency 12/08/2020   Increased prostate specific antigen (PSA) velocity 12/08/2020   BPH associated with nocturia 12/08/2020   Right otitis media 06/03/2020   Chronic systolic heart failure (HCC) 04/08/2020   Ventricular tachycardia (HCC)    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   Palpitations 08/11/2019    Trigger finger, right ring finger 01/06/2019   Hyperglycemia 10/17/2018   Patellar subluxation, left, initial encounter 06/13/2018   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/2018   Greater trochanteric bursitis of right hip 04/18/2018   Degenerative disc disease, lumbar 12/29/2016   Right knee pain 11/08/2012   Back pain 06/02/2011    PCP: Oliver Barre, MD  REFERRING PROVIDER: Denyse Amass, MD  REFERRING DIAG: right shoulder pain  THERAPY DIAG:  Muscle weakness (generalized)  Acute pain of right shoulder  Rationale for Evaluation and Treatment: Rehabilitation  ONSET DATE: March 2024  SUBJECTIVE:  SUBJECTIVE STATEMENT: Status quo, ex are fine. Only thing that really bothers me is sitting working on computer PERTINENT HISTORY: See above  PAIN:  Are you having pain? Yes: NPRS scale: 2/10 Pain location: right shoulder Pain description: ache, sharp Aggravating factors: pain up to 6/10 reaching and using it and on the computer a long period of time Relieving factors: uses some creme feels better, at best 1/10  PRECAUTIONS: None  WEIGHT BEARING RESTRICTIONS: No  FALLS:  Has patient fallen in last 6 months? No  LIVING ENVIRONMENT: Lives with: lives with their family Lives in: House/apartment Stairs: No Has following equipment at home: None  OCCUPATION: Still works doing a Financial planner work  PLOF: Airline pilot 3x/week  PATIENT GOALS:less pain  NEXT MD VISIT:   OBJECTIVE:   DIAGNOSTIC FINDINGS:  Diagnostic Limited MSK Ultrasound of: Right shoulder Biceps tendon intact normal-appearing Subscapularis tendon intact without visible retracted tear Supraspinatus tendon appears to be intact without full-thickness retracted tear.  There is some hypoechoic change in the superficial portion  of the tendon which could represent partial tear. Infraspinatus tendon appears to be intact. AC joint degenerative. Impression: Possible supraspinatus partial-thickness tear.  PATIENT SURVEYS:  FOTO 54  COGNITION: Overall cognitive status: Within functional limits for tasks assessed     SENSATION: WFL  POSTURE: Fwd head, rounded shoulders  UPPER EXTREMITY ROM:   Active ROM Right eval Left eval RT 06/02/22 RT 06/11/22  Shoulder flexion 140 150 160 175  Shoulder extension 145 150 165 175  Shoulder abduction      Shoulder adduction      Shoulder internal rotation 50 70  90  Shoulder external rotation 80 90  90  Elbow flexion      Elbow extension      Wrist flexion      Wrist extension      Wrist ulnar deviation      Wrist radial deviation      Wrist pronation      Wrist supination      (Blank rows = not tested)  UPPER EXTREMITY MMT:  MMT Right eval Left eval RT 06/11/22  Shoulder flexion 4+ 5 5  Shoulder extension     Shoulder abduction 4 5 5   Shoulder adduction     Shoulder internal rotation 4+ 5 5  Shoulder external rotation 4+ 5 5  Middle trapezius     Lower trapezius     Elbow flexion     Elbow extension     Wrist flexion     Wrist extension     Wrist ulnar deviation     Wrist radial deviation     Wrist pronation     Wrist supination     Grip strength (lbs)     (Blank rows = not tested)  SHOULDER SPECIAL TESTS: Impingement tests: Neer impingement test: positive  Rotator cuff assessment: Empty can test: positive  Biceps assessment: Yergason's test: negative  PALPATION:  A lot of crepitus with movements of the right shoulder, mild tenderness superior shoulder   TODAY'S TREATMENT:  DATE:   06/11/22 Assessed goals, ROM and MMT UBE L 3 2 min each way Seated row 20# 2 sets 10 Lat Pull down 20# 2 sets 10 Trciep ext  25# 2 sets 10 Bicep Curl 15 # 2 sets 10 Cable pulley shld ext 10# 2 sets 10 5# cable pulley IR and ER 2 sets 10 3# wt bar shld ext and IR 15 x each    06/02/22 UBE L 3 3 min each way- while on UBE educ on desk ergonomics and supporting arm to decrease stress/ strain and shld pain 3# shld flex into abd then lower 10 x then # shld abd into flex then lower BIL RT shld empty can and ER 2 sets 10 3# 3# 4 pt rhy stab on wall 10 x each 5# seated row, horz abd and shld ext 2 sets 10 Progressed HEP   PATIENT EDUCATION: Education details: HEP/POC Person educated: Patient Education method: Programmer, multimedia, Demonstration, Actor cues, Verbal cues, and Handouts Education comprehension: verbalized understanding  HOME EXERCISE PROGRAM:  06/02/22 Access Code: [email or web address] URL: [email or web address] Date: 06/02/2022 Prepared by: Brooklyn Jeff  Exercises - Standing Shoulder Flexion to 90 Degrees  - 1 x daily - 7 x weekly - 1 sets - 15 reps - 3-5# hold - Shoulder Abduction - Thumbs Up  - 1 x daily - 7 x weekly - 1 sets - 15 reps - 3-5# hold - Shoulder PNF D2 Flexion  - 1 x daily - 7 x weekly - 1 sets - 15 reps - 3-5# hold - Seated Shoulder Empty Can with Dumbbells  - 1 x daily - 7 x weekly - 1 sets - 15 reps - 3-5# hold - Shoulder External Rotation with Anchored Resistance  - 1 x daily - 7 x weekly - 1 sets - 15 reps - Shoulder Internal Rotation with Resistance  - 1 x daily - 7 x weekly - 1 sets - 15 reps  Access Code: [email or web address] URL: [email or web address] Date: 05/25/2022 Prepared by: Stacie Glaze  Exercises - Doorway Pec Stretch at 90 Degrees Abduction  - 2 x daily - 7 x weekly - 1 sets - 5 reps - 10 hold - Standing Shoulder Row with Anchored Resistance  - 2 x daily - 7 x weekly - 1 sets - 10 reps - 3 hold - Shoulder External Rotation and Scapular Retraction with Resistance  - 2 x daily - 7 x weekly - 1 sets - 10 reps - 3 hold - Shoulder extension with resistance -  Neutral  - 2 x daily - 7 x weekly - 1 sets - 10 reps - 3 hold  ASSESSMENT:  CLINICAL IMPRESSION: Pt arrived stating after last session until Sunday he could hardly move arm it hurt so bad and up into neck. Assessed ROM/MMT and goals and he is doing very well and showing excellent progress. Pain in shld is much better now and overall better, he was unsure what of new ex aggravated him so did minimal of 2nd HEP still doing one form initial eval. Pt states implemented changes at computer and that is helpful. Pt still presents with fwd head and rounded shoulders, cued with ex for improved.  OBJECTIVE IMPAIRMENTS: decreased ROM, decreased strength, increased edema, increased fascial restrictions, impaired perceived functional ability, increased muscle spasms, impaired flexibility, impaired UE functional use, improper body mechanics, postural dysfunction, and pain.    REHAB POTENTIAL: Good  CLINICAL DECISION MAKING: Stable/uncomplicated  EVALUATION COMPLEXITY: Low   GOALS: Goals reviewed with patient? Yes  SHORT TERM GOALS: Target date: 06/08/22  Independent with initial HEP Goal status: MET 06/02/22  LONG TERM GOALS: Target date: 08/24/22  Understand posture and body mechanics Goal status: 06/11/22 progressing  2.  Independent with advanced HEP and or gym Goal status: 06/11/22 progressing  3.  Decrease pain overall 50% Goal status: met 06/11/22  4.  Increased right shoulder strength to 5/5 Goal status: 06/11/22 MET  5.  Increase right shoulder flexion and IR to = the left Goal status: 06/11/22 MET  PLAN:  PT FREQUENCY: 1-2x/week  PT DURATION: 12 weeks  PLANNED INTERVENTIONS: Therapeutic exercises, Therapeutic activity, Neuromuscular re-education, Balance training, Gait training, Patient/Family education, Self Care, Joint mobilization, Dry Needling, Electrical stimulation, Cryotherapy, Moist heat, Taping, Vasopneumatic device, Ultrasound, Ionotophoresis 4mg /ml Dexamethasone, and  Manual therapy  PLAN FOR NEXT SESSION: 1 more session next week, assess and progress response to session today   Zaydenn Balaguer,ANGIE, PTA 06/11/2022, 3:26 PM [facility] at [facility] [address] [facility]. [place], [place], [address] Phone: [phone]   Fax:  [phone]  Patient Details  Name: Kyrel Brees MRN: [id number] Date of Birth: [birth year] Referring Provider:  Corwin Levins, MD  Encounter Date: 06/11/2022   Suanne Marker, PTA 06/11/2022, 3:26 PM  [facility] at [facility] [address] [place], [place], [address] Phone: [phone]   Fax:  [phone] at [facility] [address] [place], [place], [address] Phone: [phone]   Fax:  [phone]  Patient Details  Name: Christiopher Raiola MRN: [id number] Date of Birth: 04/28/1945 Referring Provider:  Corwin Levins, MD  Encounter Date: 06/11/2022   Suanne Marker, PTA 06/11/2022, 3:26 PM  [facility] at [facility] [address] [place], [place], [address] Phone: [phone]   Fax:  [phone]

## 2022-06-16 ENCOUNTER — Ambulatory Visit: Payer: 59 | Admitting: Physical Therapy

## 2022-06-18 ENCOUNTER — Encounter: Payer: Self-pay | Admitting: Family Medicine

## 2022-06-19 MED ORDER — PREDNISONE 50 MG PO TABS: 50.0000 mg | ORAL_TABLET | Freq: Every day | ORAL | 0 refills | Status: DC

## 2022-06-19 MED ORDER — GABAPENTIN 300 MG PO CAPS: 300.0000 mg | ORAL_CAPSULE | Freq: Three times a day (TID) | ORAL | 3 refills | Status: DC | PRN

## 2022-06-23 NOTE — Progress Notes (Signed)
   Rubin Payor, PhD, LAT, ATC acting as a scribe for [name], MD.  Thomas Caldwell is a 77 y.o. male who presents to [facility] today for back pain. Pt was previously seen by Dr. Denyse Amass on 05/18/22 for chronic R shoulder pain. Pt sent a MyChart message of May 23rd LBP w/ numbness and weakness in both legs and was prescribed gabapentin and prednisone.   Today, pt reports ***. Pt locates pain to ***  Radiating pain: LE numbness/tingling: LE weakness: Aggravates: Treatments tried:  Dx imaging: 07/21/21 L-spine XR  Pertinent review of systems: ***  Relevant historical information: ***   Exam:  There were no vitals taken for this visit. General: Well Developed, well nourished, and in no acute distress.   MSK: ***    Lab and Radiology Results No results found for this or any previous visit (from the past 72 hour(s)). No results found.     Assessment and Plan: 77 y.o. male with ***   PDMP not reviewed this encounter. No orders of the defined types were placed in this encounter.  No orders of the defined types were placed in this encounter.    Discussed warning signs or symptoms. Please see discharge instructions. Patient expresses understanding.   ***

## 2022-06-24 ENCOUNTER — Encounter: Payer: Self-pay | Admitting: Family Medicine

## 2022-06-24 ENCOUNTER — Ambulatory Visit: Payer: 59 | Admitting: Family Medicine

## 2022-06-24 ENCOUNTER — Ambulatory Visit (INDEPENDENT_AMBULATORY_CARE_PROVIDER_SITE_OTHER): Payer: 59

## 2022-06-24 VITALS — BP 130/80 | HR 67 | Ht 72.0 in | Wt 185.2 lb

## 2022-06-24 DIAGNOSIS — M5416 Radiculopathy, lumbar region: Secondary | ICD-10-CM

## 2022-06-24 NOTE — Patient Instructions (Addendum)
Thank you for coming in today.   Please get an Xray today before you leave   Let me know how this goes.   We could do more including MRI if you need me to.   For now watchful waiting.

## 2022-06-29 NOTE — Progress Notes (Signed)
Lumbar spine x-ray shows worsening arthritis in the low back.

## 2022-07-15 MED ORDER — PREDNISONE 50 MG PO TABS: 50.0000 mg | ORAL_TABLET | Freq: Every day | ORAL | 0 refills | Status: DC

## 2022-07-15 NOTE — Addendum Note (Signed)
Addended by: Rodolph Bong on: 07/15/2022 07:30 AM   Modules accepted: Orders

## 2022-08-02 ENCOUNTER — Other Ambulatory Visit: Payer: Self-pay | Admitting: Physician Assistant

## 2022-08-06 ENCOUNTER — Ambulatory Visit (INDEPENDENT_AMBULATORY_CARE_PROVIDER_SITE_OTHER): Payer: Medicare HMO | Admitting: Internal Medicine

## 2022-08-06 ENCOUNTER — Encounter: Payer: Self-pay | Admitting: Internal Medicine

## 2022-08-06 VITALS — BP 118/68 | HR 50 | Temp 98.5°F | Ht 72.0 in | Wt 185.0 lb

## 2022-08-06 DIAGNOSIS — K5900 Constipation, unspecified: Secondary | ICD-10-CM

## 2022-08-06 DIAGNOSIS — R351 Nocturia: Secondary | ICD-10-CM

## 2022-08-06 DIAGNOSIS — K4090 Unilateral inguinal hernia, without obstruction or gangrene, not specified as recurrent: Secondary | ICD-10-CM | POA: Insufficient documentation

## 2022-08-06 DIAGNOSIS — E559 Vitamin D deficiency, unspecified: Secondary | ICD-10-CM | POA: Diagnosis not present

## 2022-08-06 DIAGNOSIS — I5022 Chronic systolic (congestive) heart failure: Secondary | ICD-10-CM | POA: Diagnosis not present

## 2022-08-06 DIAGNOSIS — N401 Enlarged prostate with lower urinary tract symptoms: Secondary | ICD-10-CM | POA: Diagnosis not present

## 2022-08-06 NOTE — Assessment & Plan Note (Signed)
Improved, cont laxative prn

## 2022-08-06 NOTE — Assessment & Plan Note (Signed)
Stable volume, cont same treatment

## 2022-08-06 NOTE — Assessment & Plan Note (Signed)
Mild to mod, cont same tx,  to f/u any worsening symptoms or concerns 

## 2022-08-06 NOTE — Progress Notes (Signed)
Patient ID: Thomas Caldwell, male   DOB: 1945-08-14, 77 y.o.   MRN: [id number]        Chief Complaint: follow up left groin pain and now swelling       HPI:  Thomas Caldwell is a 77 y.o. male here with c/o mild intermittent pain to the left groin area for 2-3 mo, worse in last 2-3 wks now moderate and more constant, assoc with sweling, and worse pain and swelling with having BM, but n/v, other radiation, and Denies worsening reflux, dysphagia bowel change or blood. No fever chills.  No prior hx of abd imaging recently.  Last colonscopy 2018 with diverticulosis and hemorrhoids. Int and external. Denies urinary symptoms such as dysuria, frequency, urgency, flank pain, hematuria but does have nocturia 1-3 times per night.  Pt denies chest pain, increased sob or doe, wheezing, orthopnea, PND, increased LE swelling, palpitations, dizziness or syncope.   Pt denies polydipsia, polyuria, or new focal neuro s/s.  Not pt now completely retired since July 1.        Wt Readings from Last 3 Encounters:  08/06/22 185 lb (83.9 kg)  06/24/22 185 lb 3.2 oz (84 kg)  06/04/22 186 lb (84.4 kg)   BP Readings from Last 3 Encounters:  08/06/22 118/68  06/24/22 130/80  06/04/22 122/78         Past Medical History:  Diagnosis Date   ALLERGIC RHINITIS 01/11/2008   ANEURYSM 02/02/2007   brain   BRADYCARDIA, CHRONIC 10/06/2007   DIVERTICULOSIS, COLON 07/05/2007   EUSTACHIAN TUBE DYSFUNCTION 04/06/2009   GERD 07/05/2007   HYPERLIPIDEMIA 02/02/2007   HYPERTENSION 02/02/2007   Lumbago 01/11/2008   Peptic ulcer    PERSISTENT DISORDER INITIATING/MAINTAINING SLEEP 02/02/2007   Past Surgical History:  Procedure Laterality Date   APPENDECTOMY  1982   LEFT HEART CATH AND CORONARY ANGIOGRAPHY N/A 10/04/2019   Procedure: LEFT HEART CATH AND CORONARY ANGIOGRAPHY;  Surgeon: Lennette Bihari, MD;  Location: [facility];  Service: Cardiovascular;  Laterality: N/A;   LUMBAR DISC SURGERY  12/2005   Dr. Venetia Maxon   [facility] Left brain   1992   due to brain aneurysm s/p repair    reports that he has never smoked. He has never used smokeless tobacco. He reports current alcohol use. He reports that he does not use drugs. family history includes Esophageal cancer in his brother; Heart disease in his father and sister; Hypertension in his brother and father. Allergies  Allergen Reactions   Codeine Other (See Comments)    Confusion   Eplerenone Other (See Comments)    Breast tenderness   Spironolactone Other (See Comments)    Breast tenderness   Current Outpatient Medications on File Prior to Visit  Medication Sig Dispense Refill   aspirin 81 MG EC tablet Take 81 mg by mouth daily.     dexamethasone (DECADRON) 0.5 MG/5ML solution Take by mouth.     dronedarone (MULTAQ) 400 MG tablet Take 1 tablet (400 mg total) by mouth 2 (two) times daily with a meal. 60 tablet 11   empagliflozin (JARDIANCE) 10 MG TABS tablet Take 1 tablet (10 mg total) by mouth daily before breakfast. 90 tablet 3   ENTRESTO 97-103 MG TAKE 1 TABLET BY MOUTH TWICE DAILY 180 tablet 3   gabapentin (NEURONTIN) 300 MG capsule Take 1 capsule (300 mg total) by mouth 3 (three) times daily as needed. 90 capsule 3   guaiFENesin (MUCINEX) 600 MG 12 hr tablet Take 2 tablets (1,200 mg  total) by mouth 2 (two) times daily as needed. 60 tablet 1   metoprolol succinate (TOPROL XL) 25 MG 24 hr tablet Take 1 tablet (25 mg total) by mouth daily. 90 tablet 3   Plecanatide (TRULANCE) 3 MG TABS 1 tab by mouth once daily 90 tablet 3   predniSONE (DELTASONE) 50 MG tablet Take 1 tablet (50 mg total) by mouth daily. 5 tablet 0   rosuvastatin (CRESTOR) 20 MG tablet TAKE 1 TABLET(20 MG) BY MOUTH DAILY 90 tablet 3   No current facility-administered medications on file prior to visit.        ROS:  All others reviewed and negative.  Objective        PE:  BP 118/68 (BP Location: Right Arm, Patient Position: Sitting, Cuff Size: Normal)   Pulse (!) 50   Temp 98.5 F (36.9 C) (Oral)    Ht 6' (1.829 m)   Wt 185 lb (83.9 kg)   SpO2 95%   BMI 25.09 kg/m                 Constitutional: Pt appears in NAD, non toxic               HENT: Head: NCAT.                Right Ear: External ear normal.                 Left Ear: External ear normal.                Eyes: . Pupils are equal, round, and reactive to light. Conjunctivae and EOM are normal               Nose: without d/c or deformity               Neck: Neck supple. Gross normal ROM               Cardiovascular: Normal rate and regular rhythm.                 Pulmonary/Chest: Effort normal and breath sounds without rales or wheezing.                Abd:  Soft, NT, ND, + BS, no organomegaly, left inguinal area with mild tender but reducible mild to mod sized hernia               Neurological: Pt is alert. At baseline orientation, motor grossly intact               Skin: Skin is warm. No rashes, no other new lesions, LE edema - none               Psychiatric: Pt behavior is normal without agitation   Micro: none  Cardiac tracings I have personally interpreted today:  none  Pertinent Radiological findings (summarize): none   Lab Results  Component Value Date   WBC 8.5 06/04/2022   HGB 17.0 06/04/2022   HCT 50.6 06/04/2022   PLT 237.0 06/04/2022   GLUCOSE 83 06/04/2022   CHOL 152 06/04/2022   TRIG 125.0 06/04/2022   HDL 50.30 06/04/2022   LDLCALC 76 06/04/2022   ALT 23 06/04/2022   AST 23 06/04/2022   NA 140 06/04/2022   K 4.9 06/04/2022   CL 103 06/04/2022   CREATININE 1.39 06/04/2022   BUN 19 06/04/2022   CO2 30 06/04/2022   TSH 2.76 06/04/2022   PSA 2.60 06/04/2022  HGBA1C 5.7 06/04/2022   Assessment/Plan:  Thomas Caldwell is a 77 y.o. White or Caucasian [1] male with  has a past medical history of ALLERGIC RHINITIS (01/11/2008), ANEURYSM (02/02/2007), BRADYCARDIA, CHRONIC (10/06/2007), DIVERTICULOSIS, COLON (07/05/2007), EUSTACHIAN TUBE DYSFUNCTION (04/06/2009), GERD (07/05/2007), HYPERLIPIDEMIA (02/02/2007),  HYPERTENSION (02/02/2007), Lumbago (01/11/2008), Peptic ulcer, and PERSISTENT DISORDER INITIATING/MAINTAINING SLEEP (02/02/2007).  Left inguinal hernia Recent onset worsening, reducible but mod symptomatic, nontoxic and doubt incarceration, but will need refer to Gen Surgury soon  Constipation Improved, cont laxative prn  BPH associated with nocturia Mild to mod, cont same tx,  to f/u any worsening symptoms or concerns   Vitamin D deficiency Last vitamin D Lab Results  Component Value Date   VD25OH 24.46 (L) 06/04/2022   Low, to start oral replacement   Chronic systolic heart failure (HCC) Stable volume, cont same treatment  Followup: Return if symptoms worsen or fail to improve.  Oliver Barre, MD 08/06/2022 9:58 AM [facility]

## 2022-08-06 NOTE — Assessment & Plan Note (Signed)
Recent onset worsening, reducible but mod symptomatic, nontoxic and doubt incarceration, but will need refer to Gen Surgury soon

## 2022-08-06 NOTE — Patient Instructions (Signed)
Please continue all other medications as before, and refills have been done if requested.  Please have the pharmacy call with any other refills you may need.  Please continue your efforts at being more active, low cholesterol diet, and weight control.  Please keep your appointments with your specialists as you may have planned  You will be contacted regarding the referral for: General Surgury - asap

## 2022-08-06 NOTE — Assessment & Plan Note (Signed)
Last vitamin D Lab Results  Component Value Date   VD25OH 24.46 (L) 06/04/2022   Low, to start oral replacement  

## 2022-08-07 ENCOUNTER — Telehealth: Payer: Self-pay

## 2022-08-07 DIAGNOSIS — K4090 Unilateral inguinal hernia, without obstruction or gangrene, not specified as recurrent: Secondary | ICD-10-CM | POA: Diagnosis not present

## 2022-08-07 NOTE — Telephone Encounter (Signed)
   Pre-operative Risk Assessment    Patient Name: Thomas Caldwell  DOB: [birth year] MRN: [id number]      Request for Surgical Clearance    Procedure:   Hernia Surgery  Date of Surgery:  Clearance TBD                                 Surgeon:  Violeta Gelinas, MD Surgeon's Group or Practice Name:  [facility] Phone number:  [phone] Fax number:  [phone] Attn: Jacques Navy, RN   Type of Clearance Requested:   - Medical  - Pharmacy:  Hold Aspirin will need instructions on when/if to hold   Type of Anesthesia:  General    Additional requests/questions:    SignedZada Finders   08/07/2022, 4:23 PM

## 2022-08-10 ENCOUNTER — Telehealth: Payer: Self-pay

## 2022-08-10 NOTE — Telephone Encounter (Signed)
Pt scheduled for tele health visit on 08/13/22. Med rec and consent done

## 2022-08-10 NOTE — Telephone Encounter (Signed)
  Patient Consent for Virtual Visit         Thomas Caldwell has provided verbal consent on 08/10/2022 for a virtual visit (video or telephone).   CONSENT FOR VIRTUAL VISIT FOR:  Thomas Caldwell  By participating in this virtual visit I agree to the following:  I hereby voluntarily request, consent and authorize [facility] and its employed or contracted physicians, physician assistants, nurse practitioners or other licensed health care professionals (the Practitioner), to provide me with telemedicine health care services (the "Services") as deemed necessary by the treating Practitioner. I acknowledge and consent to receive the Services by the Practitioner via telemedicine. I understand that the telemedicine visit will involve communicating with the Practitioner through live audiovisual communication technology and the disclosure of certain medical information by electronic transmission. I acknowledge that I have been given the opportunity to request an in-person assessment or other available alternative prior to the telemedicine visit and am voluntarily participating in the telemedicine visit.  I understand that I have the right to withhold or withdraw my consent to the use of telemedicine in the course of my care at any time, without affecting my right to future care or treatment, and that the Practitioner or I may terminate the telemedicine visit at any time. I understand that I have the right to inspect all information obtained and/or recorded in the course of the telemedicine visit and may receive copies of available information for a reasonable fee.  I understand that some of the potential risks of receiving the Services via telemedicine include:  Delay or interruption in medical evaluation due to technological equipment failure or disruption; Information transmitted may not be sufficient (e.g. poor resolution of images) to allow for appropriate medical decision making by the  Practitioner; and/or  In rare instances, security protocols could fail, causing a breach of personal health information.  Furthermore, I acknowledge that it is my responsibility to provide information about my medical history, conditions and care that is complete and accurate to the best of my ability. I acknowledge that Practitioner's advice, recommendations, and/or decision may be based on factors not within their control, such as incomplete or inaccurate data provided by me or distortions of diagnostic images or specimens that may result from electronic transmissions. I understand that the practice of medicine is not an exact science and that Practitioner makes no warranties or guarantees regarding treatment outcomes. I acknowledge that a copy of this consent can be made available to me via my patient portal [facility] MyChart), or I can request a printed copy by calling the office of [facility].    I understand that my insurance will be billed for this visit.   I have read or had this consent read to me. I understand the contents of this consent, which adequately explains the benefits and risks of the Services being provided via telemedicine.  I have been provided ample opportunity to ask questions regarding this consent and the Services and have had my questions answered to my satisfaction. I give my informed consent for the services to be provided through the use of telemedicine in my medical care

## 2022-08-10 NOTE — Telephone Encounter (Signed)
   Name: Thomas Caldwell  DOB: 1945-05-15  MRN: [id number]  Primary Cardiologist: Christell Constant, MD   Preoperative team, please contact this patient and set up a phone call appointment for further preoperative risk assessment. Please obtain consent and complete medication review. Thank you for your help.  I confirm that guidance regarding antiplatelet and oral anticoagulation therapy has been completed and, if necessary, noted below.  His aspirin is not prescribed by cardiology.  Recommendations for holding aspirin will need to come from prescribing provider   Ronney Asters, NP 08/10/2022, 1:09 PM [facility]

## 2022-08-13 ENCOUNTER — Ambulatory Visit: Payer: 59 | Attending: Internal Medicine | Admitting: Nurse Practitioner

## 2022-08-13 DIAGNOSIS — Z01810 Encounter for preprocedural cardiovascular examination: Secondary | ICD-10-CM | POA: Diagnosis not present

## 2022-08-13 NOTE — Progress Notes (Signed)
Virtual Visit via Telephone Note   Because of Thomas Caldwell's co-morbid illnesses, he is at least at moderate risk for complications without adequate follow up.  This format is felt to be most appropriate for this patient at this time.  The patient did not have access to video technology/had technical difficulties with video requiring transitioning to audio format only (telephone).  All issues noted in this document were discussed and addressed.  No physical exam could be performed with this format.  Please refer to the patient's chart for his consent to telehealth for [facility].  Evaluation Performed:  Preoperative cardiovascular risk assessment _____________   Date:  08/13/2022   Patient ID:  Thomas Caldwell, DOB [birth year], MRN [id number] Patient Location:  Home Provider location:   Office  Primary Care Provider:  Corwin Levins, MD Primary Cardiologist:  Christell Constant, MD  Chief Complaint / Patient Profile   77 y.o. y/o male with a h/o chronic systolic heart failure, NICM, PVCs, ventricular tachycardia, and hyperlipidemia who is pending hernia surgery with Dr. Violeta Gelinas of [facility] and presents today for telephonic preoperative cardiovascular risk assessment.  History of Present Illness    Thomas Caldwell is a 77 y.o. male who presents via audio/video conferencing for a telehealth visit today.  Pt was last seen in cardiology clinic on 01/29/2022 by Dr. Izora Ribas.  At that time Thomas Caldwell was doing well.  The patient is now pending procedure as outlined above. Since his last visit, he has done well from a cardiac standpoint.   He denies chest pain, palpitations, dyspnea, pnd, orthopnea, n, v, dizziness, syncope, edema, weight gain, or early satiety. All other systems reviewed and are otherwise negative except as noted above.   Past Medical History    Past Medical History:  Diagnosis Date   ALLERGIC RHINITIS 01/11/2008    ANEURYSM 02/02/2007   brain   BRADYCARDIA, CHRONIC 10/06/2007   DIVERTICULOSIS, COLON 07/05/2007   EUSTACHIAN TUBE DYSFUNCTION 04/06/2009   GERD 07/05/2007   HYPERLIPIDEMIA 02/02/2007   HYPERTENSION 02/02/2007   Lumbago 01/11/2008   Peptic ulcer    PERSISTENT DISORDER INITIATING/MAINTAINING SLEEP 02/02/2007   Past Surgical History:  Procedure Laterality Date   APPENDECTOMY  1982   LEFT HEART CATH AND CORONARY ANGIOGRAPHY N/A 10/04/2019   Procedure: LEFT HEART CATH AND CORONARY ANGIOGRAPHY;  Surgeon: Lennette Bihari, MD;  [facility] INV[facility] CV LAB;  Service: Cardiovascular;  Laterality: N/A;   LUMBAR DISC SURGERY  12/2005   Dr. Venetia Maxon   [facility] Left brain  1992   due to brain aneurysm s/p repair    Allergies  Allergies  Allergen Reactions   Codeine Other (See Comments)    Confusion   Eplerenone Other (See Comments)    Breast tenderness   Spironolactone Other (See Comments)    Breast tenderness    Home Medications    Prior to Admission medications   Medication Sig Start Date End Date Taking? Authorizing Provider  aspirin 81 MG EC tablet Take 81 mg by mouth daily.    [provider]  dronedarone (MULTAQ) 400 MG tablet Take 1 tablet (400 mg total) by mouth 2 (two) times daily with a meal. 01/05/22   Duke Salvia, MD  empagliflozin (JARDIANCE) 10 MG TABS tablet Take 1 tablet (10 mg total) by mouth daily before breakfast. 09/24/21   Christell Constant, MD  ENTRESTO 97-103 MG TAKE 1 TABLET BY MOUTH TWICE DAILY 08/04/22   Sheilah Pigeon, PA-C  gabapentin (NEURONTIN) 300 MG capsule Take 1 capsule (300 mg total) by mouth 3 (three) times daily as needed. 06/19/22   Rodolph Bong, MD  guaiFENesin (MUCINEX) 600 MG 12 hr tablet Take 2 tablets (1,200 mg total) by mouth 2 (two) times daily as needed. 06/03/20   Corwin Levins, MD  metoprolol succinate (TOPROL XL) 25 MG 24 hr tablet Take 1 tablet (25 mg total) by mouth daily. 09/19/21   Duke Salvia, MD  Plecanatide (TRULANCE) 3 MG TABS 1  tab by mouth once daily Patient not taking: Reported on 08/10/2022 06/04/22   Corwin Levins, MD  rosuvastatin (CRESTOR) 20 MG tablet TAKE 1 TABLET(20 MG) BY MOUTH DAILY 12/22/21   Corwin Levins, MD    Physical Exam    Vital Signs:  Thomas Caldwell does not have vital signs available for review today.  Given telephonic nature of communication, physical exam is limited. AAOx3. NAD. Normal affect.  Speech and respirations are unlabored.  Accessory Clinical Findings    None  Assessment & Plan    1.  Preoperative Cardiovascular Risk Assessment:  According to the Revised Cardiac Risk Index (RCRI), his Perioperative Risk of Major Cardiac Event is (%): 0.9. His Functional Capacity in METs is: 7.01 according to the Duke Activity Status Index (DASI).Therefore, based on ACC/AHA guidelines, patient would be at acceptable risk for the planned procedure without further cardiovascular testing.   The patient was advised that if he develops new symptoms prior to surgery to contact our office to arrange for a follow-up visit, and he verbalized understanding.  Per office protocol, he may hold Aspirin for 5-7 days prior to procedure. Please resume Aspirin as soon as possible postprocedure, at the discretion of the surgeon.    A copy of this note will be routed to requesting surgeon.  Time:   Today, I have spent 5 minutes with the patient with telehealth technology discussing medical history, symptoms, and management plan.     Joylene Grapes, NP  08/13/2022, 10:28 AM

## 2022-08-21 ENCOUNTER — Ambulatory Visit: Payer: 59 | Admitting: Internal Medicine

## 2022-08-25 ENCOUNTER — Encounter (HOSPITAL_COMMUNITY): Payer: Self-pay | Admitting: General Surgery

## 2022-08-25 ENCOUNTER — Ambulatory Visit: Payer: Self-pay | Admitting: General Surgery

## 2022-08-25 ENCOUNTER — Other Ambulatory Visit: Payer: Self-pay

## 2022-08-25 NOTE — Progress Notes (Addendum)
PCP - Dr. Oliver Barre Cardiologist - Izora Ribas, [place]   PPM/ICD - denies   Chest x-ray - 02/19/21 EKG - 12/25/21 Stress Test - denies ECHO - 04/07/21 Cardiac Cath - 10/04/19  CPAP - denies  DM- denies (Take Jardiance for HF). Pt states he was not instructed to hold Jardiance. Last dose was 7/30 at about 0700. Pt instructed to hold until after surgery.  Blood Thinner Instructions: n/a Aspirin Instructions: f/u with surgeon. Last dose 7/26  ERAS Protcol - clears until 0430  COVID TEST- n/a  Anesthesia review: yes, cardiac hx  Patient verbally denies any shortness of breath, fever, cough and chest pain during phone call   -------------  SDW INSTRUCTIONS given:  Your procedure is scheduled on Thursday 8/1.  Report to [facility] Main Entrance "A" at 0530 A.M., and check in at the Admitting office.  Call this number if you have problems the morning of surgery:  [phone]   Remember:  Do not eat after midnight the night before your surgery  You may drink clear liquids until 04:30 AM the morning of your surgery.   Clear liquids allowed are: Water, Non-Citrus Juices (without pulp), Carbonated Beverages, Clear Tea, Black Coffee Only, and Gatorade    Take these medicines the morning of surgery with A SIP OF WATER  Multaq Gabapentin PRN Metoprolol Rosuvastatin  Hold Jardiance 72 hours prior to surgery.  Follow your surgeon's instructions on when to stop Aspirin.  If no instructions were given by your surgeon then you will need to call the office to get those instructions.     As of today, STOP taking any Aleve, Naproxen, Ibuprofen, Motrin, Advil, Goody's, BC's, all herbal medications, fish oil, and all vitamins.                      Do not wear jewelry, make up, or nail polish            Do not wear lotions, powders, perfumes/colognes, or deodorant.            Do not shave 48 hours prior to surgery.  Men may shave face and neck.            Do not bring valuables to  the hospital.            [facility] is not responsible for any belongings or valuables.  Do NOT Smoke (Tobacco/Vaping) 24 hours prior to your procedure If you use a CPAP at night, you may bring all equipment for your overnight stay.   Contacts, glasses, dentures or bridgework may not be worn into surgery.      For patients admitted to the hospital, discharge time will be determined by your treatment team.   Patients discharged the day of surgery will not be allowed to drive home, and someone needs to stay with them for 24 hours.    Special instructions:   [facility]- Preparing For Surgery  Before surgery, you can play an important role. Because skin is not sterile, your skin needs to be as free of germs as possible. You can reduce the number of germs on your skin by washing with CHG (chlorahexidine gluconate) Soap before surgery.  CHG is an antiseptic cleaner which kills germs and bonds with the skin to continue killing germs even after washing.    Oral Hygiene is also important to reduce your risk of infection.  Remember - BRUSH YOUR TEETH THE MORNING OF SURGERY WITH YOUR REGULAR TOOTHPASTE  Please do not use if you have an allergy to CHG or antibacterial soaps. If your skin becomes reddened/irritated stop using the CHG.  Do not shave (including legs and underarms) for at least 48 hours prior to first CHG shower. It is OK to shave your face.  Please follow these instructions carefully.   Shower the NIGHT BEFORE SURGERY and the MORNING OF SURGERY with DIAL Soap.   Pat yourself dry with a CLEAN TOWEL.  Wear CLEAN PAJAMAS to bed the night before surgery  Place CLEAN SHEETS on your bed the night of your first shower and DO NOT SLEEP WITH PETS.   Day of Surgery: Please shower morning of surgery  Wear Clean/Comfortable clothing the morning of surgery Do not apply any deodorants/lotions.   Remember to brush your teeth WITH YOUR REGULAR TOOTHPASTE.   Questions were answered.  Patient verbalized understanding of instructions.

## 2022-08-26 NOTE — Progress Notes (Signed)
Anesthesia Chart Review: Maury Dus  Case: [id number] Date/Time: 08/27/22 0715   Procedure: LEFT HERNIA REPAIR INGUINAL WITH MESH (Left)   Anesthesia type: General   Pre-op diagnosis: LEFT INGUINAL HERNIA   [facility] ROOM [facility] / [facility] OR   Surgeons: Violeta Gelinas, MD       DISCUSSION: Patient is a 77 year old male scheduled for the above procedure.  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  He is followed by cardiologist Dr. Izora Ribas and EP cardiologist Dr. Graciela Husbands. First evaluated by EP in 08/2019 for abnormal monitor showing non-sustained afib, NSVT and frequent PVCs. EKg with left bundle inferior axis morphology. 10/04/19 Echo ordered showing LVEF 30-35% with global LV hypokinesis, moderately dilated LV, severely dilated LA. 10/04/19 LHC showed 60% mid LAD with otherwise mild disease in the LCX and RCA, EF 20-25%. 11/13/19 cMRI findings suspicious for non-compaction type NICM.He was started on Toprol, Entreso, and flecainide for VT burden.  Current therapy includes ASA 81 mg, Multaq 400 mg BID, Jardiance 10 mg daily, Entresto 97-103 mg BID, Toprol XL 25 md daily, Crestor 20 mg daily. More recent findings on cMRI 12/15/21 were less suggestive of LVNC with LVEF 41%. 04/07/21 echo showed LVEF 40-45%, LV global hypokinesis, grade 1 diastolic dysfunction, normal RV systolic function, mild MR.   Preoperative cardiology evaluation by Bernadene Person, NP on 08/13/22, "According to the Revised Cardiac Risk Index (RCRI), his Perioperative Risk of Major Cardiac Event is (%): 0.9. His Functional Capacity in METs is: 7.01 according to the Duke Activity Status Index  (DASI).Therefore, based on ACC/AHA guidelines, patient would be at acceptable risk for the planned procedure without further cardiovascular testing." He may hold ASA for 5-7 days prior to procedure and resume asap post-procedure.   Last Jardiance 08/25/22 and instructed to hold until after surgery. Last ASA reported as 08/21/22.   Anesthesia team to evaluate on the day of surgery. Updated labs on arrival as indicated.    VS: Ht 6' (1.829 m)   Wt 81.6 kg   BMI 24.41 kg/m  BP Readings from Last 3 Encounters:  08/06/22 118/68  06/24/22 130/80  06/04/22 122/78   Pulse Readings from Last 3 Encounters:  08/06/22 (!) 50  06/24/22 67  06/04/22 (!) 55     PROVIDERS: Corwin Levins, MD is PCP  Riley Lam, MD is cardiologist  Sherryl Manges, MD is EP cardiologist   LABS: For labs on the day of surgery as indicated. Most recent results in [facility] include: Lab Results  Component Value Date   WBC 8.5 06/04/2022   HGB 17.0 06/04/2022   HCT 50.6 06/04/2022   PLT 237.0 06/04/2022   GLUCOSE 83 06/04/2022   CHOL 152 06/04/2022   TRIG 125.0 06/04/2022   HDL 50.30 06/04/2022   LDLCALC 76 06/04/2022   ALT 23 06/04/2022   AST 23 06/04/2022   NA 140 06/04/2022   K 4.9 06/04/2022   CL 103 06/04/2022   CREATININE 1.39 06/04/2022   BUN 19 06/04/2022   CO2 30 06/04/2022   TSH 2.76 06/04/2022   PSA 2.60 06/04/2022   HGBA1C 5.7 06/04/2022    EKG: 12/25/21: SB at 49 bpm. Anteroseptal infarct, age undetermined.   CV: MRI Cardiac 12/15/21: FINDINGS: 1. Mild increase  in left ventricular size, with LVEDD 58 mm, but LVEDVi 97 mL/m2. - Normal left ventricular thickness, with intraventricular septal thickness of 10 mm, posterior wall thickness of 8 mm, and myocardial mass index of 53 g/m2. - Moderately reduced left ventricular systolic function (LVEF =41%). There is septal dyssynchrony and abnormal septal motion. Maximal trabeculation ratio 1.75. - Left ventricular parametric mapping  notable for normal ECV. - There is no late gadolinium enhancement in the left ventricular myocardium.   2. Normal right ventricular size with RVEDVI 90 mL/m2. - Normal right ventricular thickness. - Normal right ventricular systolic function (RVEF =50%). There are no regional wall motion abnormalities or aneurysms.   3.  Normal left and right atrial size.  4. Normal size of the aortic root, ascending aorta and pulmonary artery.  5. Valve assessment: - Aortic Valve: Tri-leaflet aortic valve. There is mild regurgitation. Regurgitant fraction 10%. - Pulmonic Valve: There is mild regurgitation. Regurgitant fraction 9%. - Tricuspid Valve: Qualitatively, there is mild regurgitation. - Mitral Valve: Qualitatively, there is mild regurgitation.   6.  Normal pericardium.  No pericardial effusion.   7. Grossly, no extracardiac findings. Recommended dedicated study if concerned for non-cardiac pathology.   IMPRESSION: Compared to prior, this study is less suggestive of LVNC. LVEF has improved.   Echo 04/07/21: IMPRESSIONS   1. No mural thrombus. Left ventricular ejection fraction, by estimation,  is 40 to 45%. Left ventricular ejection fraction by 2D MOD biplane is 39.9  %. The left ventricle has mildly decreased function. The left ventricle  demonstrates global hypokinesis.  Left ventricular diastolic parameters are consistent with Grade I  diastolic dysfunction (impaired relaxation).   2. Right ventricular systolic function is normal. The right ventricular  size is normal. Tricuspid regurgitation signal is inadequate for assessing  PA pressure.   3. Left atrial size was moderately dilated.   4. The mitral valve is abnormal. Mild mitral valve regurgitation.   5. The aortic valve is tricuspid. Aortic valve regurgitation is not  visualized. Aortic valve sclerosis/calcification is present, without any  evidence of aortic stenosis.   6. The pulmonic valve was abnormal.  -  Comparison(s): Changes from prior study are noted. 01/01/2020: LVEF 30-35%.    Long term Ziopatch monitor 04/22/20-04/25/20: - Patch Wear Time:  2 days and 22 hours (2022-03-28T07:03:41-0400 to 2022-03-31T05:57:48-0400) - Patient had a min HR of 41 bpm, max HR of 176 bpm, and avg HR of 56 bpm. Predominant underlying rhythm was Sinus Rhythm.  - 34 Ventricular Tachycardia runs occurred, the run with the fastest interval lasting 4 beats with a max rate of 176 bpm, the longest lasting 7 beats with an avg rate of 112 bpm.  - Isolated SVEs were rare (<1.0%), and no SVE Couplets or SVE Triplets were present. Isolated VEs were occasional (4.3%, 7840), VE Couplets were occasional (3.4%, 3091), and VE Triplets were rare (<1.0%, 191).  Ventricular Bigeminy and Trigeminy were present.    Cardiac cath 10/04/19: Prox RCA lesion is 5% stenosed. Mid LAD lesion is 60% stenosed. There is severe left ventricular systolic dysfunction. LV end diastolic pressure is normal. The left ventricular ejection fraction is less than 25% by visual estimate.   There is evidence for mild coronary calcification involving the proximal LAD and left circumflex coronary arteries.   The LAD has a stenosis on a bend in the vessel between the first small diagonal and next major diagonal vessel.  In most views this appears to be 40 to 50%.  However in  the RAO and AP cranial views it appears at least 60 to possibly 70%.  There is brisk TIMI-3 flow.   Mild calcification in the left circumflex coronary artery without obstructive disease.   Mild irregularity in the proximal RCA which gives rise to a large PDA vessel distally.   Severe global LV dysfunction with EF estimate at 20 to 25% with global hypocontractility.  LVEDP 13 mm   RECOMMENDATION: I discussed the catheterization findings with Dr. Hurman Horn.  The patient will undergo a 2D echo Doppler study today post catheterization in the short stay unit prior to going home.  He will  follow up with Dr. Graciela Husbands for adjustments to his medical management and further recommendations.   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 Venetia Maxon   [facility] Left brain  1992   due to brain aneurysm s/p repair    MEDICATIONS: No current facility-administered medications for this encounter.    aspirin 81 MG EC tablet   dronedarone (MULTAQ) 400 MG tablet   empagliflozin (JARDIANCE) 10 MG TABS tablet   ENTRESTO 97-103 MG   gabapentin (NEURONTIN) 300 MG capsule   metoprolol succinate (TOPROL XL) 25 MG 24 hr tablet   rosuvastatin (CRESTOR) 20 MG tablet    Shonna Chock, PA-C Surgical Short Stay/Anesthesiology [facility] Phone [phone] [facility] Phone [phone] 08/26/2022 12:07 PM

## 2022-08-26 NOTE — Anesthesia Preprocedure Evaluation (Signed)
Anesthesia Evaluation  Patient identified by MRN, date of birth, ID band Patient awake    Reviewed: Allergy & Precautions, NPO status , Patient's Chart, lab work & pertinent test results  Airway Mallampati: II  TM Distance: >3 FB Neck ROM: Full    Dental  (+) Teeth Intact, Dental Advisory Given   Pulmonary    breath sounds clear to auscultation       Cardiovascular hypertension, Pt. on home beta blockers +CHF   Rhythm:Regular Rate:Normal     Neuro/Psych negative neurological ROS  negative psych ROS   GI/Hepatic Neg liver ROS, PUD,GERD  ,,  Endo/Other  negative endocrine ROS    Renal/GU negative Renal ROS     Musculoskeletal  (+) Arthritis ,    Abdominal   Peds  Hematology negative hematology ROS (+)   Anesthesia Other Findings   Reproductive/Obstetrics                             Anesthesia Physical Anesthesia Plan  ASA: 3  Anesthesia Plan: General   Post-op Pain Management: Tylenol PO (pre-op)*   Induction: Intravenous  PONV Risk Score and Plan: 3 and Ondansetron, Dexamethasone and Treatment may vary due to age or medical condition  Airway Management Planned: Oral ETT  Additional Equipment: None  Intra-op Plan:   Post-operative Plan: Extubation in OR  Informed Consent: I have reviewed the patients History and Physical, chart, labs and discussed the procedure including the risks, benefits and alternatives for the proposed anesthesia with the patient or authorized representative who has indicated his/her understanding and acceptance.     Dental advisory given  Plan Discussed with: CRNA  Anesthesia Plan Comments: (See PAT note written 08/26/2022 by Shonna Chock, PA-C.   )       Anesthesia Quick Evaluation

## 2022-08-27 ENCOUNTER — Ambulatory Visit (HOSPITAL_COMMUNITY)
Admission: RE | Admit: 2022-08-27 | Discharge: 2022-08-27 | Disposition: A | Discharge status: Home / Self Care | Payer: Medicare HMO | Attending: General Surgery | Admitting: General Surgery

## 2022-08-27 ENCOUNTER — Encounter (HOSPITAL_COMMUNITY): Payer: Self-pay | Admitting: General Surgery

## 2022-08-27 ENCOUNTER — Encounter (HOSPITAL_COMMUNITY)
Admission: RE | Disposition: A | Discharge status: Home / Self Care | Payer: Self-pay | Source: Home / Self Care | Attending: General Surgery

## 2022-08-27 ENCOUNTER — Other Ambulatory Visit (HOSPITAL_COMMUNITY): Payer: Self-pay

## 2022-08-27 ENCOUNTER — Ambulatory Visit (HOSPITAL_COMMUNITY): Payer: Medicare HMO | Admitting: Vascular Surgery

## 2022-08-27 ENCOUNTER — Ambulatory Visit (HOSPITAL_BASED_OUTPATIENT_CLINIC_OR_DEPARTMENT_OTHER): Payer: Medicare HMO | Admitting: Vascular Surgery

## 2022-08-27 DIAGNOSIS — K4090 Unilateral inguinal hernia, without obstruction or gangrene, not specified as recurrent: Secondary | ICD-10-CM | POA: Diagnosis not present

## 2022-08-27 DIAGNOSIS — I671 Cerebral aneurysm, nonruptured: Secondary | ICD-10-CM | POA: Diagnosis not present

## 2022-08-27 DIAGNOSIS — I5022 Chronic systolic (congestive) heart failure: Secondary | ICD-10-CM

## 2022-08-27 DIAGNOSIS — I110 Hypertensive heart disease with heart failure: Secondary | ICD-10-CM | POA: Insufficient documentation

## 2022-08-27 DIAGNOSIS — D176 Benign lipomatous neoplasm of spermatic cord: Secondary | ICD-10-CM | POA: Insufficient documentation

## 2022-08-27 DIAGNOSIS — E7800 Pure hypercholesterolemia, unspecified: Secondary | ICD-10-CM

## 2022-08-27 DIAGNOSIS — I428 Other cardiomyopathies: Secondary | ICD-10-CM | POA: Insufficient documentation

## 2022-08-27 HISTORY — PX: INGUINAL HERNIA REPAIR: SHX194

## 2022-08-27 HISTORY — PX: INSERTION OF MESH: SHX5868

## 2022-08-27 HISTORY — DX: Unspecified osteoarthritis, unspecified site: M19.90

## 2022-08-27 HISTORY — DX: Heart failure, unspecified: I50.9

## 2022-08-27 LAB — BASIC METABOLIC PANEL
Anion gap: 11 (ref 5–15)
BUN: 22 mg/dL (ref 8–23)
CO2: 24 mmol/L (ref 22–32)
Calcium: 9.4 mg/dL (ref 8.9–10.3)
Chloride: 104 mmol/L (ref 98–111)
Creatinine, Ser: 1.27 mg/dL — ABNORMAL HIGH (ref 0.61–1.24)
GFR, Estimated: 59 mL/min — ABNORMAL LOW (ref >60)
Glucose, Bld: 97 mg/dL (ref 70–99)
Potassium: 3.9 mmol/L (ref 3.5–5.1)
Sodium: 139 mmol/L (ref 135–145)

## 2022-08-27 LAB — CBC
HCT: 48.5 % (ref 39.0–52.0)
Hemoglobin: 15.9 g/dL (ref 13.0–17.0)
MCH: 29.3 pg (ref 26.0–34.0)
MCHC: 32.8 g/dL (ref 30.0–36.0)
MCV: 89.5 fL (ref 80.0–100.0)
Platelets: 196 10*3/uL (ref 150–400)
RBC: 5.42 MIL/uL (ref 4.22–5.81)
RDW: 14.2 % (ref 11.5–15.5)
WBC: 7.8 10*3/uL (ref 4.0–10.5)
nRBC: 0 % (ref 0.0–0.2)

## 2022-08-27 SURGERY — REPAIR, HERNIA, INGUINAL, ADULT
Anesthesia: General | Site: Groin | Laterality: Left

## 2022-08-27 MED ORDER — DEXAMETHASONE SODIUM PHOSPHATE 10 MG/ML IJ SOLN
INTRAMUSCULAR | Status: AC
  Filled 2022-08-27: qty 2

## 2022-08-27 MED ORDER — DEXAMETHASONE SODIUM PHOSPHATE 10 MG/ML IJ SOLN
INTRAMUSCULAR | Status: DC | PRN
  Administered 2022-08-27: 5 mg via INTRAVENOUS

## 2022-08-27 MED ORDER — PROPOFOL 10 MG/ML IV BOLUS
INTRAVENOUS | Status: AC
  Filled 2022-08-27: qty 20

## 2022-08-27 MED ORDER — MIDAZOLAM HCL 2 MG/2ML IJ SOLN: 1.0000 mg | Freq: Once | INTRAMUSCULAR | Status: AC

## 2022-08-27 MED ORDER — OXYCODONE HCL 5 MG/5ML PO SOLN
5.0000 mg | Freq: Once | ORAL | Status: AC | PRN
  Administered 2022-08-27: 5 mg via ORAL

## 2022-08-27 MED ORDER — PROPOFOL 10 MG/ML IV BOLUS
INTRAVENOUS | Status: DC | PRN
  Administered 2022-08-27: 140 mg via INTRAVENOUS

## 2022-08-27 MED ORDER — BUPIVACAINE-EPINEPHRINE 0.25% -1:200000 IJ SOLN
INTRAMUSCULAR | Status: DC | PRN
  Administered 2022-08-27: 10 mL

## 2022-08-27 MED ORDER — OXYCODONE HCL 5 MG/5ML PO SOLN
ORAL | Status: AC
  Filled 2022-08-27: qty 5

## 2022-08-27 MED ORDER — FENTANYL CITRATE (PF) 250 MCG/5ML IJ SOLN
INTRAMUSCULAR | Status: AC
  Filled 2022-08-27: qty 5

## 2022-08-27 MED ORDER — AMISULPRIDE (ANTIEMETIC) 5 MG/2ML IV SOLN: 10.0000 mg | Freq: Once | INTRAVENOUS | Status: DC | PRN

## 2022-08-27 MED ORDER — SUGAMMADEX SODIUM 200 MG/2ML IV SOLN
INTRAVENOUS | Status: DC | PRN
  Administered 2022-08-27: 200 mg via INTRAVENOUS

## 2022-08-27 MED ORDER — ACETAMINOPHEN 325 MG PO TABS: 325.0000 mg | ORAL_TABLET | ORAL | Status: DC | PRN

## 2022-08-27 MED ORDER — FENTANYL CITRATE (PF) 100 MCG/2ML IJ SOLN
25.0000 ug | INTRAMUSCULAR | Status: DC | PRN
  Administered 2022-08-27 (×2): 25 ug via INTRAVENOUS

## 2022-08-27 MED ORDER — ACETAMINOPHEN 10 MG/ML IV SOLN
1000.0000 mg | Freq: Once | INTRAVENOUS | Status: DC | PRN
  Administered 2022-08-27: 1000 mg via INTRAVENOUS

## 2022-08-27 MED ORDER — LACTATED RINGERS IV SOLN: INTRAVENOUS | Status: DC

## 2022-08-27 MED ORDER — ONDANSETRON HCL 4 MG/2ML IJ SOLN
INTRAMUSCULAR | Status: AC
  Filled 2022-08-27: qty 4

## 2022-08-27 MED ORDER — CHLORHEXIDINE GLUCONATE CLOTH 2 % EX PADS: 6.0000 | MEDICATED_PAD | Freq: Once | CUTANEOUS | Status: DC

## 2022-08-27 MED ORDER — BUPIVACAINE LIPOSOME 1.3 % IJ SUSP: 20.0000 mL | Freq: Once | INTRAMUSCULAR | Status: DC

## 2022-08-27 MED ORDER — BUPIVACAINE-EPINEPHRINE (PF) 0.25% -1:200000 IJ SOLN
INTRAMUSCULAR | Status: AC
  Filled 2022-08-27: qty 30

## 2022-08-27 MED ORDER — FENTANYL CITRATE (PF) 100 MCG/2ML IJ SOLN
INTRAMUSCULAR | Status: AC
  Administered 2022-08-27: 50 ug
  Filled 2022-08-27: qty 2

## 2022-08-27 MED ORDER — CHLORHEXIDINE GLUCONATE 0.12 % MT SOLN
15.0000 mL | OROMUCOSAL | Status: AC
  Administered 2022-08-27: 15 mL via OROMUCOSAL
  Filled 2022-08-27: qty 15

## 2022-08-27 MED ORDER — PROMETHAZINE HCL 25 MG/ML IJ SOLN: 6.2500 mg | INTRAMUSCULAR | Status: DC | PRN

## 2022-08-27 MED ORDER — ROCURONIUM BROMIDE 10 MG/ML (PF) SYRINGE
PREFILLED_SYRINGE | INTRAVENOUS | Status: DC | PRN
  Administered 2022-08-27: 55 mg via INTRAVENOUS

## 2022-08-27 MED ORDER — MIDAZOLAM HCL 2 MG/2ML IJ SOLN
INTRAMUSCULAR | Status: AC
  Filled 2022-08-27: qty 2

## 2022-08-27 MED ORDER — PHENYLEPHRINE 80 MCG/ML (10ML) SYRINGE FOR IV PUSH (FOR BLOOD PRESSURE SUPPORT)
PREFILLED_SYRINGE | INTRAVENOUS | Status: AC
  Filled 2022-08-27: qty 20

## 2022-08-27 MED ORDER — ACETAMINOPHEN 160 MG/5ML PO SOLN: 325.0000 mg | ORAL | Status: DC | PRN

## 2022-08-27 MED ORDER — PHENYLEPHRINE 80 MCG/ML (10ML) SYRINGE FOR IV PUSH (FOR BLOOD PRESSURE SUPPORT)
PREFILLED_SYRINGE | INTRAVENOUS | Status: DC | PRN
  Administered 2022-08-27: 80 ug via INTRAVENOUS
  Administered 2022-08-27 (×2): 40 ug via INTRAVENOUS

## 2022-08-27 MED ORDER — FENTANYL CITRATE (PF) 100 MCG/2ML IJ SOLN: 50.0000 ug | Freq: Once | INTRAMUSCULAR | Status: DC

## 2022-08-27 MED ORDER — CEFAZOLIN SODIUM-DEXTROSE 2-4 GM/100ML-% IV SOLN
2.0000 g | INTRAVENOUS | Status: AC
  Administered 2022-08-27: 2 g via INTRAVENOUS
  Filled 2022-08-27: qty 100

## 2022-08-27 MED ORDER — LIDOCAINE 2% (20 MG/ML) 5 ML SYRINGE
INTRAMUSCULAR | Status: AC
  Filled 2022-08-27: qty 10

## 2022-08-27 MED ORDER — LIDOCAINE 2% (20 MG/ML) 5 ML SYRINGE
INTRAMUSCULAR | Status: DC | PRN
  Administered 2022-08-27: 40 mg via INTRAVENOUS

## 2022-08-27 MED ORDER — PHENYLEPHRINE 80 MCG/ML (10ML) SYRINGE FOR IV PUSH (FOR BLOOD PRESSURE SUPPORT)
PREFILLED_SYRINGE | INTRAVENOUS | Status: AC
  Filled 2022-08-27: qty 10

## 2022-08-27 MED ORDER — FENTANYL CITRATE (PF) 250 MCG/5ML IJ SOLN
INTRAMUSCULAR | Status: DC | PRN
  Administered 2022-08-27: 75 ug via INTRAVENOUS
  Administered 2022-08-27: 50 ug via INTRAVENOUS

## 2022-08-27 MED ORDER — ACETAMINOPHEN 500 MG PO TABS
1000.0000 mg | ORAL_TABLET | ORAL | Status: AC
  Administered 2022-08-27: 1000 mg via ORAL
  Filled 2022-08-27: qty 2

## 2022-08-27 MED ORDER — MIDAZOLAM HCL 2 MG/2ML IJ SOLN
INTRAMUSCULAR | Status: AC
  Administered 2022-08-27: 1 mg via INTRAVENOUS
  Filled 2022-08-27: qty 2

## 2022-08-27 MED ORDER — OXYCODONE HCL 5 MG PO TABS: 5.0000 mg | ORAL_TABLET | Freq: Once | ORAL | Status: AC | PRN

## 2022-08-27 MED ORDER — FENTANYL CITRATE (PF) 100 MCG/2ML IJ SOLN
INTRAMUSCULAR | Status: AC
  Administered 2022-08-27: 50 ug via INTRAVENOUS
  Filled 2022-08-27: qty 2

## 2022-08-27 MED ORDER — TRAMADOL HCL 50 MG PO TABS
50.0000 mg | ORAL_TABLET | Freq: Four times a day (QID) | ORAL | 0 refills | Status: DC | PRN
  Filled 2022-08-27: qty 25, 7d supply, fill #0

## 2022-08-27 MED ORDER — ACETAMINOPHEN 10 MG/ML IV SOLN
INTRAVENOUS | Status: AC
  Filled 2022-08-27: qty 100

## 2022-08-27 MED ORDER — ONDANSETRON HCL 4 MG/2ML IJ SOLN
INTRAMUSCULAR | Status: DC | PRN
  Administered 2022-08-27: 4 mg via INTRAVENOUS

## 2022-08-27 MED ORDER — 0.9 % SODIUM CHLORIDE (POUR BTL) OPTIME
TOPICAL | Status: DC | PRN
  Administered 2022-08-27: 1000 mL

## 2022-08-27 SURGICAL SUPPLY — 40 items
ADH SKN CLS APL DERMABOND .7 (GAUZE/BANDAGES/DRESSINGS) ×1
APL PRP STRL LF DISP 70% ISPRP (MISCELLANEOUS) ×1
BAG COUNTER SPONGE SURGICOUNT (BAG) ×1 IMPLANT
BAG SPNG CNTER NS LX DISP (BAG) ×1
BLADE CLIPPER SURG (BLADE) IMPLANT
CANISTER SUCT 3000ML PPV (MISCELLANEOUS) IMPLANT
CHLORAPREP W/TINT 26 (MISCELLANEOUS) ×1 IMPLANT
COVER SURGICAL LIGHT HANDLE (MISCELLANEOUS) ×1 IMPLANT
DERMABOND ADVANCED .7 DNX12 (GAUZE/BANDAGES/DRESSINGS) ×1 IMPLANT
DRAIN PENROSE .5X12 LATEX STL (DRAIN) IMPLANT
DRAIN PENROSE 0.75X12 (DRAIN) IMPLANT
DRAPE LAPAROTOMY 100X72 PEDS (DRAPES) ×1 IMPLANT
ELECT REM PT RETURN 9FT ADLT (ELECTROSURGICAL) ×1
ELECTRODE REM PT RTRN 9FT ADLT (ELECTROSURGICAL) ×1 IMPLANT
GLOVE BIO SURGEON STRL SZ8 (GLOVE) ×1 IMPLANT
GLOVE BIOGEL PI IND STRL 8 (GLOVE) ×1 IMPLANT
GOWN STRL REUS W/ TWL LRG LVL3 (GOWN DISPOSABLE) ×1 IMPLANT
GOWN STRL REUS W/ TWL XL LVL3 (GOWN DISPOSABLE) ×1 IMPLANT
GOWN STRL REUS W/TWL LRG LVL3 (GOWN DISPOSABLE) ×1
GOWN STRL REUS W/TWL XL LVL3 (GOWN DISPOSABLE) ×1
KIT BASIN OR (CUSTOM PROCEDURE TRAY) ×1 IMPLANT
KIT TURNOVER KIT B (KITS) ×1 IMPLANT
MESH HERNIA 3X6 (Mesh General) IMPLANT
NDL 22X1.5 STRL (OR ONLY) (MISCELLANEOUS) ×1 IMPLANT
NEEDLE 22X1.5 STRL (OR ONLY) (MISCELLANEOUS) ×1 IMPLANT
NS IRRIG 1000ML POUR BTL (IV SOLUTION) ×1 IMPLANT
PACK GENERAL/GYN (CUSTOM PROCEDURE TRAY) ×1 IMPLANT
PAD ARMBOARD 7.5X6 YLW CONV (MISCELLANEOUS) ×1 IMPLANT
PENCIL SMOKE EVACUATOR (MISCELLANEOUS) ×1 IMPLANT
SPECIMEN JAR SMALL (MISCELLANEOUS) IMPLANT
SUT MNCRL AB 4-0 PS2 18 (SUTURE) ×1 IMPLANT
SUT PROLENE 2 0 CT2 30 (SUTURE) ×3 IMPLANT
SUT VIC AB 2-0 SH 27 (SUTURE) ×2
SUT VIC AB 2-0 SH 27X BRD (SUTURE) ×1 IMPLANT
SUT VIC AB 3-0 SH 27 (SUTURE) ×1
SUT VIC AB 3-0 SH 27X BRD (SUTURE) ×1 IMPLANT
SUT VICRYL AB 3 0 TIES (SUTURE) IMPLANT
SYR CONTROL 10ML LL (SYRINGE) ×1 IMPLANT
TOWEL GREEN STERILE (TOWEL DISPOSABLE) ×1 IMPLANT
TOWEL GREEN STERILE FF (TOWEL DISPOSABLE) ×1 IMPLANT

## 2022-08-27 NOTE — Transfer of Care (Signed)
Immediate Anesthesia Transfer of Care Note  Patient: Thomas Caldwell  Procedure(s) Performed: LEFT HERNIA REPAIR INGUINAL (Left: Groin) INSERTION OF MESH (Left: Groin)  Patient Location: PACU  Anesthesia Type:General  Level of Consciousness: awake and alert   Airway & Oxygen Therapy: Patient Spontanous Breathing  Post-op Assessment: Report given to RN and Post -op Vital signs reviewed and stable  Post vital signs: Reviewed and stable  Last Vitals:  Vitals Value Taken Time  BP 165/144 08/27/22 1131  Temp    Pulse 63 08/27/22 1132  Resp 18 08/27/22 1132  SpO2 96 % 08/27/22 1132  Vitals shown include unfiled device data.  Last Pain:  Vitals:   08/27/22 0845  TempSrc:   PainSc: 0-No pain      Patients Stated Pain Goal: 0 (08/27/22 0610)  Complications: No notable events documented.

## 2022-08-27 NOTE — Op Note (Signed)
08/27/2022  11:23 AM  PATIENT:  Thomas Caldwell  77 y.o. male  PRE-OPERATIVE DIAGNOSIS:  LEFT INGUINAL HERNIA  POST-OPERATIVE DIAGNOSIS:  LEFT INGUINAL HERNIA  PROCEDURE:  Procedure(s): LEFT HERNIA REPAIR INGUINAL INSERTION OF MESH  SURGEON:  Surgeon(s): Violeta Gelinas, MD  ASSISTANTS: none   ANESTHESIA:   local and general  EBL:  Total I/O In: 100 [IV Piggyback:100] Out: -   BLOOD ADMINISTERED:none  DRAINS: none   SPECIMEN:  No Specimen  DISPOSITION OF SPECIMEN:  N/A  COUNTS:  YES  DICTATION: .Dragon Dictation Findings: Indirect hernia and small direct hernia  Procedure in detail: Informed consent was obtained.  His site was marked in the preop holding area.  He received intravenous antibiotics.  He received a TAP block by anesthesia.  He was brought to the operating room and general endotracheal anesthesia was administered by the anesthesia staff.  His abdomen and groins were prepped and draped in a sterile fashion.  We did a timeout procedure.  I injected some local along the planned line incision in the left inguinal region.  Left inguinal incision was made.  Subcutaneous tissues were dissected down to through Scarpa's fascia revealing the external bleak fascia.  Hemostasis was obtained with cautery.  The external bleak fascia was quite attenuated due to the hernia.  It was divided laterally and the division was continued medially down through the external ring.  The inferior leaflet was dissected down to the shelving edge of the inguinal ligament.  The superior leaflet was dissected off the transversalis.  The cord structures were encircled with a Penrose drain.  Inspection of the pelvic floor revealed a small direct hernia which easily reduced.  The cord was then dissected revealing a large indirect hernia.  I also removed a cord lipoma using cautery.  The indirect hernia sac was dissected off of the cord structures.  It was opened and the contents were reduced.  It  only contained omentum.  The sac was then high ligated with 2-0 Vicryl.  The hernia was then repaired with a keyhole polypropylene mesh cut to custom size and shape.  It was tacked with 2-0 Prolene to the tissues over the pubic tubercle.  The inferior leaflet of the mesh was sutured in a running fashion to the shelving edge of the inguinal ligament.  The superior leaflet of the mesh was tacked down to the tissues over the pubic tubercle and then out laterally to the transversalis with interrupted 2-0 Prolene sutures.  The 2 leaflets of the mesh were rejoined behind the cord structures and sutured together and to the underlying tissues with 2-0 Prolene in an interrupted fashion.  The 2 leaflets of the mesh were left long and tucked out laterally underneath the external oblique fascia.  The area was copiously irrigated.  The aperture in the mesh admitted the tip of the fifth digit and the cord was not edematous.  Hemostasis was ensured.  The external bleak fascia was closed with a running 2-0 Vicryl as able.  Subcutaneous tissues were irrigated and closed with interrupted 3-0 Vicryl.  The skin was closed with running 4-0 Monocryl subcuticular followed by Dermabond.  All counts were correct.  He tolerated the procedure well.  No apparent complications.  He was taken recovery in stable condition.  I relocated his left testicle to the anatomic position at the completion of the case. PATIENT DISPOSITION:  PACU - hemodynamically stable.   Delay start of Pharmacological VTE agent (>24hrs) due to surgical blood loss or  risk of bleeding:  no  Violeta Gelinas, MD, MPH, FACS Pager: [phone]  8/1/202411:23 AM

## 2022-08-27 NOTE — Progress Notes (Signed)
Patient reports eating four grapes this morning at 0430 because "I wanted them". States he was told by a nurse that he could eat up until that time. Will make anesthesia aware.

## 2022-08-27 NOTE — H&P (Signed)
Thomas Caldwell is an 77 y.o. male.   Chief Complaint: Left inguinal hernia HPI: Patient presents for repair of left inguinal hernia with mesh.  His symptoms have been stable since I saw him in the office.  He has been wearing his hernia belt for support.  Of note, he reports eating for grapes that around 4:00 this morning.  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 Venetia Maxon   [facility] Left brain  1992   due to brain aneurysm s/p repair    Family History  Problem Relation Age of Onset   Heart disease Sister    Hypertension Father    Heart disease Father    Hypertension Brother        3 bother with HTN   Esophageal cancer Brother    Colon cancer Neg Hx    Rectal cancer Neg Hx    Stomach cancer Neg Hx    Social History:  reports that he has never smoked. He has never used smokeless tobacco. He reports current alcohol use. He reports that he does not use drugs.  Allergies:  Allergies  Allergen Reactions   Codeine Other (See Comments)    Confusion   Eplerenone Other (See Comments)    Breast tenderness   Spironolactone Other (See Comments)    Breast tenderness    Medications Prior to Admission  Medication Sig Dispense Refill   aspirin 81 MG EC tablet Take 81 mg  by mouth daily.     dronedarone (MULTAQ) 400 MG tablet Take 1 tablet (400 mg total) by mouth 2 (two) times daily with a meal. 60 tablet 11   empagliflozin (JARDIANCE) 10 MG TABS tablet Take 1 tablet (10 mg total) by mouth daily before breakfast. 90 tablet 3   ENTRESTO 97-103 MG TAKE 1 TABLET BY MOUTH TWICE DAILY 180 tablet 3   gabapentin (NEURONTIN) 300 MG capsule Take 1 capsule (300 mg total) by mouth 3 (three) times daily as needed. 90 capsule 3   metoprolol succinate (TOPROL XL) 25 MG 24 hr tablet Take 1 tablet (25 mg total) by mouth daily. 90 tablet 3   rosuvastatin (CRESTOR) 20 MG tablet TAKE 1 TABLET(20 MG) BY MOUTH DAILY 90 tablet 3    Results for orders placed or performed during the hospital encounter of 08/27/22 (from the past 48 hour(s))  CBC     Status: None   Collection Time: 08/27/22  6:15 AM  Result Value Ref Range   WBC 7.8 4.0 - 10.5 K/uL   RBC 5.42 4.22 - 5.81 MIL/uL   Hemoglobin 15.9 13.0 - 17.0 g/dL   HCT 16.1 09.6 - 04.5 %   MCV 89.5 80.0 - 100.0 fL   MCH 29.3 26.0 - 34.0 pg   MCHC 32.8  30.0 - 36.0 g/dL   RDW 30.8 65.7 - 84.6 %   Platelets 196 150 - 400 K/uL   nRBC 0.0 0.0 - 0.2 %    Comment: Performed at [facility], [address], [place], [place] [address]  Basic metabolic panel     Status: Abnormal   Collection Time: 08/27/22  6:15 AM  Result Value Ref Range   Sodium 139 135 - 145 mmol/L   Potassium 3.9 3.5 - 5.1 mmol/L   Chloride 104 98 - 111 mmol/L   CO2 24 22 - 32 mmol/L   Glucose, Bld 97 70 - 99 mg/dL    Comment: Glucose reference range applies only to samples taken after fasting for at least 8 hours.   BUN 22 8 - 23 mg/dL   Creatinine, Ser 2.84 (H) 0.61 - 1.24 mg/dL   Calcium 9.4 8.9 - 13.2 mg/dL   GFR, Estimated 59 (L) >60 mL/min    Comment: (NOTE) Calculated using the CKD-EPI Creatinine Equation (2021)    Anion gap 11 5 - 15    Comment: Performed at [facility], [address] [place], [place] [address]   No results  found.  Review of Systems  Blood pressure (!) 152/69, pulse (!) 56, temperature 97.6 F (36.4 C), temperature source Oral, resp. rate 18, height 6' (1.829 m), weight 81.6 kg, SpO2 97%. Physical Exam HENT:     Nose: Nose normal.  Cardiovascular:     Rate and Rhythm: Normal rate and regular rhythm.  Pulmonary:     Effort: Pulmonary effort is normal.     Breath sounds: Normal breath sounds.  Abdominal:     General: Abdomen is flat.     Palpations: Abdomen is soft.     Tenderness: There is no abdominal tenderness.     Hernia: A hernia is present.     Comments: Left inguinal hernia is present but reduced when he is lying down  Musculoskeletal:        General: No swelling.  Neurological:     Mental Status: He is alert and oriented to person, place, and time.  Psychiatric:        Mood and Affect: Mood normal.      Assessment/Plan Left inguinal hernia -he obtained preoperative cardiac clearance.  We will plan left inguinal hernia repair with mesh.  Procedure, risk, and benefits were again discussed and he is agreeable.  Due to eating the grapes at 4 AM, anesthesia needs to delay his case until around 10 AM.  We have rearranged the schedule to accommodate.  He is agreeable.  Liz Malady, MD 08/27/2022, 6:58 AM

## 2022-08-27 NOTE — Anesthesia Procedure Notes (Signed)
Procedure Name: Intubation Date/Time: 08/27/2022 10:20 AM  Performed by: Elliot Dally, CRNAPre-anesthesia Checklist: Patient identified, Emergency Drugs available, Suction available and Patient being monitored Patient Re-evaluated:Patient Re-evaluated prior to induction Oxygen Delivery Method: Circle System Utilized Preoxygenation: Pre-oxygenation with 100% oxygen Induction Type: IV induction Ventilation: Mask ventilation without difficulty Laryngoscope Size: Mac and 4 Grade View: Grade I Tube type: Oral Tube size: 7.5 mm Number of attempts: 1 Airway Equipment and Method: Stylet Placement Confirmation: ETT inserted through vocal cords under direct vision, positive ETCO2 and breath sounds checked- equal and bilateral Secured at: 22 cm Tube secured with: Tape Dental Injury: Teeth and Oropharynx as per pre-operative assessment

## 2022-08-27 NOTE — Anesthesia Postprocedure Evaluation (Signed)
Anesthesia Post Note  Patient: Thomas Caldwell  Procedure(s) Performed: LEFT HERNIA REPAIR INGUINAL (Left: Groin) INSERTION OF MESH (Left: Groin)     Patient location during evaluation: PACU Anesthesia Type: General Level of consciousness: awake and alert Pain management: pain level controlled Vital Signs Assessment: post-procedure vital signs reviewed and stable Respiratory status: spontaneous breathing, nonlabored ventilation, respiratory function stable and patient connected to nasal cannula oxygen Cardiovascular status: blood pressure returned to baseline and stable Postop Assessment: no apparent nausea or vomiting Anesthetic complications: no  No notable events documented.  Last Vitals:  Vitals:   08/27/22 1215 08/27/22 1230  BP: (!) 168/79 (!) 156/76  Pulse: (!) 56 (!) 47  Resp: 15 15  Temp:  36.5 C  SpO2: 97% 95%    Last Pain:  Vitals:   08/27/22 1200  TempSrc:   PainSc: Asleep                 Shelton Silvas

## 2022-08-28 ENCOUNTER — Encounter (HOSPITAL_COMMUNITY): Payer: Self-pay | Admitting: General Surgery

## 2022-09-01 ENCOUNTER — Other Ambulatory Visit: Payer: Self-pay | Admitting: Internal Medicine

## 2022-09-07 ENCOUNTER — Encounter: Payer: Self-pay | Admitting: Internal Medicine

## 2022-09-08 MED ORDER — ROSUVASTATIN CALCIUM 20 MG PO TABS: ORAL_TABLET | ORAL | 2 refills | Status: DC

## 2022-09-09 ENCOUNTER — Telehealth: Payer: Self-pay | Admitting: Internal Medicine

## 2022-09-09 MED ORDER — DRONEDARONE HCL 400 MG PO TABS: 400.0000 mg | ORAL_TABLET | Freq: Two times a day (BID) | ORAL | 3 refills | Status: DC

## 2022-09-09 NOTE — Telephone Encounter (Signed)
Pt's medication was sent to pt's pharmacy as requested. Confirmation received.  °

## 2022-09-09 NOTE — Telephone Encounter (Signed)
 *  STAT* If patient is at the pharmacy, call can be transferred to refill team.   1. Which medications need to be refilled? (please list name of each medication and dose if known)   dronedarone (MULTAQ) 400 MG tablet    2. Would you like to learn more about the convenience, safety, & potential cost savings by using the [facility]? no    3. Are you open to using the [facility] (Type [facility]) no  4. Which pharmacy/location (including street and city if local pharmacy) is medication to be sent to?  [id number] - [place], [place] - [address]     5. Do they need a 30 day or 90 day supply? 90- Due to a change in my insurance coverage, can all my prescriptions be changed to 100 days instead of what I have now. the [place] went to 90 days as did the [place], but Multaq stayed at 30 days.

## 2022-09-17 DIAGNOSIS — H35033 Hypertensive retinopathy, bilateral: Secondary | ICD-10-CM | POA: Diagnosis not present

## 2022-09-17 DIAGNOSIS — Z961 Presence of intraocular lens: Secondary | ICD-10-CM | POA: Diagnosis not present

## 2022-09-17 DIAGNOSIS — H25811 Combined forms of age-related cataract, right eye: Secondary | ICD-10-CM | POA: Diagnosis not present

## 2022-09-17 DIAGNOSIS — H26492 Other secondary cataract, left eye: Secondary | ICD-10-CM | POA: Diagnosis not present

## 2022-10-01 ENCOUNTER — Other Ambulatory Visit: Payer: Self-pay | Admitting: Internal Medicine

## 2022-10-13 DIAGNOSIS — H4311 Vitreous hemorrhage, right eye: Secondary | ICD-10-CM | POA: Diagnosis not present

## 2022-10-13 DIAGNOSIS — H40013 Open angle with borderline findings, low risk, bilateral: Secondary | ICD-10-CM | POA: Diagnosis not present

## 2022-10-14 DIAGNOSIS — H43811 Vitreous degeneration, right eye: Secondary | ICD-10-CM | POA: Diagnosis not present

## 2022-10-14 DIAGNOSIS — H4311 Vitreous hemorrhage, right eye: Secondary | ICD-10-CM | POA: Diagnosis not present

## 2022-10-14 DIAGNOSIS — H25813 Combined forms of age-related cataract, bilateral: Secondary | ICD-10-CM | POA: Diagnosis not present

## 2022-10-15 DIAGNOSIS — N400 Enlarged prostate without lower urinary tract symptoms: Secondary | ICD-10-CM | POA: Diagnosis not present

## 2022-10-15 DIAGNOSIS — D6869 Other thrombophilia: Secondary | ICD-10-CM | POA: Diagnosis not present

## 2022-10-15 DIAGNOSIS — I429 Cardiomyopathy, unspecified: Secondary | ICD-10-CM | POA: Diagnosis not present

## 2022-10-15 DIAGNOSIS — Z8249 Family history of ischemic heart disease and other diseases of the circulatory system: Secondary | ICD-10-CM | POA: Diagnosis not present

## 2022-10-15 DIAGNOSIS — E785 Hyperlipidemia, unspecified: Secondary | ICD-10-CM | POA: Diagnosis not present

## 2022-10-15 DIAGNOSIS — Z008 Encounter for other general examination: Secondary | ICD-10-CM | POA: Diagnosis not present

## 2022-10-15 DIAGNOSIS — I110 Hypertensive heart disease with heart failure: Secondary | ICD-10-CM | POA: Diagnosis not present

## 2022-10-15 DIAGNOSIS — Z885 Allergy status to narcotic agent status: Secondary | ICD-10-CM | POA: Diagnosis not present

## 2022-10-15 DIAGNOSIS — I739 Peripheral vascular disease, unspecified: Secondary | ICD-10-CM | POA: Diagnosis not present

## 2022-10-15 DIAGNOSIS — I509 Heart failure, unspecified: Secondary | ICD-10-CM | POA: Diagnosis not present

## 2022-10-15 DIAGNOSIS — I4891 Unspecified atrial fibrillation: Secondary | ICD-10-CM | POA: Diagnosis not present

## 2022-10-19 ENCOUNTER — Encounter: Payer: Self-pay | Admitting: Family Medicine

## 2022-10-20 NOTE — Telephone Encounter (Signed)
He last had steroid injections for his knees 11/2021, we can repeat that if he would like.

## 2022-10-20 NOTE — Telephone Encounter (Signed)
Please reach out to pt to assist with scheduling.   Thank you!

## 2022-10-21 NOTE — Telephone Encounter (Signed)
Appointment scheduled.

## 2022-10-22 ENCOUNTER — Ambulatory Visit: Payer: 59 | Admitting: Family Medicine

## 2022-10-22 ENCOUNTER — Other Ambulatory Visit: Payer: Self-pay

## 2022-10-22 VITALS — BP 136/84 | HR 53 | Ht 72.0 in | Wt 188.0 lb

## 2022-10-22 DIAGNOSIS — G8929 Other chronic pain: Secondary | ICD-10-CM | POA: Diagnosis not present

## 2022-10-22 DIAGNOSIS — M25511 Pain in right shoulder: Secondary | ICD-10-CM

## 2022-10-22 DIAGNOSIS — M25562 Pain in left knee: Secondary | ICD-10-CM | POA: Diagnosis not present

## 2022-10-22 DIAGNOSIS — M25561 Pain in right knee: Secondary | ICD-10-CM | POA: Diagnosis not present

## 2022-10-22 NOTE — Progress Notes (Addendum)
Thomas Payor, PhD, LAT, ATC acting as a scribe for Thomas Graham, MD.  Thomas Caldwell is a 77 y.o. male who presents to [facility] today for exacerbation of his bilat knee pain. Pt's last visit for his knees was on 12/10/21 and he was given bilat knee steroid injections using the anterior approach.   Today, pt reports bilat knee pain has returned over the summer. He is interested in a repeat steroid injection. He also c/o R shoulder pain, that did not improve w/ PT.   Dx imaging: 09/28/2020 R knee MRI 09/11/2020 R knee XR 09/08/2020 L knee MRI 08/28/2020 R knee XR 07/10/2020 L knee XR 03/23/2019 R knee XR  Pertinent review of systems: No fevers or chills  Relevant historical information: Heart failure.  History of quadriceps tendon tears in the past.   Exam:  BP 136/84   Pulse (!) 53   Ht 6' (1.829 m)   Wt 188 lb (85.3 kg)   SpO2 96%   BMI 25.50 kg/m  General: Well Developed, well nourished, and in no acute distress.   MSK: Knees bilaterally normal appearing without significant effusion nontender to palpation distal quad tendon.  Normal motion and intact strength. Crepitation present with range of motion.  Right shoulder normal-appearing Pain with abduction present.  Range of motion is intact.  Strength is intact.    Lab and Radiology Results  Procedure: Real-time Ultrasound Guided Injection of right knee joint anterior medial approach Device: Philips Affiniti 50G/GE Logiq Images permanently stored and available for review in PACS Verbal informed consent obtained.  Discussed risks and benefits of procedure. Warned about infection, bleeding, hyperglycemia damage to structures among others. Patient expresses understanding and agreement Time-out conducted.   Noted no overlying erythema, induration, or other signs of local infection.   Skin prepped in a sterile fashion.   Local anesthesia: Topical Ethyl chloride.   With sterile technique  and under real time ultrasound guidance: 40 mg of Kenalog and 2 mL of Marcaine injected into knee joint. Fluid seen entering the joint capsule.   Completed without difficulty   Pain immediately resolved suggesting accurate placement of the medication.   Advised to call if fevers/chills, erythema, induration, drainage, or persistent bleeding.   Images permanently stored and available for review in the ultrasound unit.  Impression: Technically successful ultrasound guided injection.   Procedure: Real-time Ultrasound Guided Injection of left knee joint anterior medial approach Device: Philips Affiniti 50G/GE Logiq Images permanently stored and available for review in PACS Verbal informed consent obtained.  Discussed risks and benefits of procedure. Warned about infection, bleeding, hyperglycemia damage to structures among others. Patient expresses understanding and agreement Time-out conducted.   Noted no overlying erythema, induration, or other signs of local infection.   Skin prepped in a sterile fashion.   Local anesthesia: Topical Ethyl chloride.   With sterile technique and under real time ultrasound guidance: 40 mg of Kenalog and 2 mL of Marcaine injected into knee joint. Fluid seen entering the joint capsule.   Completed without difficulty   Pain immediately resolved suggesting accurate placement of the medication.   Advised to call if fevers/chills, erythema, induration, drainage, or persistent bleeding.   Images permanently stored and available for review in the ultrasound unit.  Impression: Technically successful ultrasound guided injection.   Procedure: Real-time Ultrasound Guided Injection of right shoulder subacromial bursa Device: Philips Affiniti 50G/GE Logiq Images permanently stored and available for review in PACS Verbal informed consent obtained.  Discussed risks and benefits of procedure. Warned about infection, bleeding, hyperglycemia damage to structures among  others. Patient expresses understanding and agreement Time-out conducted.   Noted no overlying erythema, induration, or other signs of local infection.   Skin prepped in a sterile fashion.   Local anesthesia: Topical Ethyl chloride.   With sterile technique and under real time ultrasound guidance: 40 mg of Kenalog and 2 mL of Marcaine injected into subacromial bursa. Fluid seen entering the bursa.   Completed without difficulty   Pain immediately resolved suggesting accurate placement of the medication.   Advised to call if fevers/chills, erythema, induration, drainage, or persistent bleeding.   Images permanently stored and available for review in the ultrasound unit.  Impression: Technically successful ultrasound guided injection.         Assessment and Plan: 77 y.o. 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  He has had quadricep tendinitis in the past but his current pain does not feel consistent with that.  Plan for anterior injection to avoid a quad tendon entirely.  Check back as needed.  Can repeat this injection every 3 months if needed.  Previous injection was about 10 months ago.  Right shoulder pain this is also an exacerbation of a chronic problem.  He has been seen for this in April 2024 and had a trial of physical therapy with incomplete resolution of symptoms.  Plan for subacromial injection today.  If this still persists consider advanced imaging with MRI.   PDMP not reviewed this encounter. Orders Placed This Encounter  Procedures   [place] LIMITED JOINT SPACE STRUCTURES LOW BILAT(NO LINKED CHARGES)    Order Specific Question:   Reason for Exam (SYMPTOM  OR DIAGNOSIS REQUIRED)    Answer:   bilateral knee pain    Order Specific Question:   Preferred imaging location?    Answer:   [facility]   No orders of the defined types were placed in this encounter.    Discussed warning signs or symptoms. Please see  discharge instructions. Patient expresses understanding.   The above documentation has been reviewed and is accurate and complete Thomas Caldwell, M.D.

## 2022-10-22 NOTE — Patient Instructions (Addendum)
Thank you for coming in today.   You received an injection today. Seek immediate medical attention if the joint becomes red, extremely painful, or is oozing fluid.   Let me know how this goes.

## 2022-10-26 ENCOUNTER — Encounter: Payer: Self-pay | Admitting: Internal Medicine

## 2022-10-26 DIAGNOSIS — I739 Peripheral vascular disease, unspecified: Secondary | ICD-10-CM

## 2022-10-30 ENCOUNTER — Encounter: Payer: Self-pay | Admitting: Family Medicine

## 2022-10-30 ENCOUNTER — Other Ambulatory Visit: Payer: Self-pay

## 2022-10-30 DIAGNOSIS — G8929 Other chronic pain: Secondary | ICD-10-CM

## 2022-11-03 ENCOUNTER — Telehealth: Payer: Self-pay | Admitting: Internal Medicine

## 2022-11-03 NOTE — Telephone Encounter (Signed)
Pt c/o medication issue:  1. Name of Medication:  empagliflozin (JARDIANCE) 10 MG TABS tablet  2. How are you currently taking this medication (dosage and times per day)?    3. Are you having a reaction (difficulty breathing--STAT)?    4. What is your medication issue?   Per Lafonda Mosses with [facility], patient informed her that he has only been taking 1/2 a tablet because it is causing him to use the restroom often. Lafonda Mosses does not know how long this has been going on and she doesn't know whether the restroom visits allude to frequent urination or diarrhea. Call may be returned to the patient with advisement. If questions, Lafonda Mosses can be contacted at [phone] + confidential voicemail.

## 2022-11-04 ENCOUNTER — Ambulatory Visit (HOSPITAL_COMMUNITY)
Admission: RE | Admit: 2022-11-04 | Discharge: 2022-11-04 | Disposition: A | Discharge status: Home / Self Care | Payer: Medicare HMO | Source: Ambulatory Visit | Attending: Cardiovascular Disease | Admitting: Cardiovascular Disease

## 2022-11-04 DIAGNOSIS — I739 Peripheral vascular disease, unspecified: Secondary | ICD-10-CM | POA: Diagnosis not present

## 2022-11-04 NOTE — Telephone Encounter (Signed)
Called pt in regards to [facility] message. Pt reports started having pain to tongue and tip of penis.  Went to dentist thought something was wrong with teeth.  Given mouth rinse that helped but whenever finished prescription symptoms would return.   Started taking half dose (5mg ) of Jardiance daily and symptoms disappeared.    Advised pt will send message to MD to advise.  Pt reports has 2 prescription of Jardiance left and does not want to stop Jardiance  at this time.

## 2022-11-05 ENCOUNTER — Other Ambulatory Visit: Payer: Self-pay | Admitting: Internal Medicine

## 2022-11-05 DIAGNOSIS — I739 Peripheral vascular disease, unspecified: Secondary | ICD-10-CM

## 2022-11-09 NOTE — Telephone Encounter (Signed)
Called pt advised of MD respone.  Pt reports previously stopped medication when was actively having symptoms.  Symptoms resolved pt then restarted Jardiance at 5 mg PO every day.  Has not had any further symptoms since decreasing dose. Advised pt will send to MD to advise if Jardiance 5 mg is okay to continue.

## 2022-11-10 ENCOUNTER — Ambulatory Visit: Payer: Medicare HMO | Admitting: Vascular Surgery

## 2022-11-10 ENCOUNTER — Encounter: Payer: Self-pay | Admitting: Vascular Surgery

## 2022-11-10 VITALS — BP 137/76 | HR 56 | Temp 97.7°F | Ht 72.0 in | Wt 186.6 lb

## 2022-11-10 DIAGNOSIS — I739 Peripheral vascular disease, unspecified: Secondary | ICD-10-CM | POA: Diagnosis not present

## 2022-11-10 NOTE — Progress Notes (Signed)
Patient name: Thomas Caldwell MRN: [id number] DOB: 11-04-1945 Sex: male  REASON FOR CONSULT: PAD with abnormal ABI/TBI  HPI: Thomas Caldwell is a 77 y.o. male, with history of systolic CHF (last EF 40-45%), hypertension, hyperlipidemia that presents for evaluation of PAD.  Patient states he did not know he had PAD until he had a physical.  He denies any pain in his legs when walking.  He has no wounds or lower extremity tissue loss.  Patient had ABIs on 11/04/2022 that were noncompressible with multiphasic waveforms.  States has been recovering from left inguinal hernia surgery but just played 9 holes of golf without any lower extremity complaints.  Does not smoke.  Past Medical History:  Diagnosis Date   ALLERGIC RHINITIS 01/11/2008   ANEURYSM 02/02/2007   brain   Arthritis    BRADYCARDIA, CHRONIC 10/06/2007   CHF (congestive heart failure) (HCC)    DIVERTICULOSIS, COLON 07/05/2007   EUSTACHIAN TUBE DYSFUNCTION 04/06/2009   GERD 07/05/2007   HYPERLIPIDEMIA 02/02/2007   HYPERTENSION 02/02/2007   Lumbago 01/11/2008   Peptic ulcer    PERSISTENT DISORDER INITIATING/MAINTAINING SLEEP 02/02/2007    Past Surgical History:  Procedure Laterality Date   APPENDECTOMY  1982   INGUINAL HERNIA REPAIR Left 08/27/2022   Procedure: LEFT HERNIA REPAIR INGUINAL;  Surgeon: Violeta Gelinas, MD;  Location: [facility] OR;  Service: General;  Laterality: Left;   INSERTION OF MESH Left 08/27/2022   Procedure: INSERTION OF MESH;  Surgeon: Violeta Gelinas, MD;  Location: [facility] OR;  Service: General;  Laterality: Left;   LEFT HEART CATH AND CORONARY ANGIOGRAPHY N/A 10/04/2019   Procedure: LEFT HEART CATH AND CORONARY ANGIOGRAPHY;  Surgeon: Lennette Bihari, MD;  Location: [facility] INVASIVE CV LAB;  Service: Cardiovascular;  Laterality: N/A;   LUMBAR DISC SURGERY  12/2005   Dr. Venetia Maxon   [facility] Left brain  1992   due to brain aneurysm s/p repair    Family History  Problem Relation Age of Onset   Heart disease Sister     Hypertension Father    Heart disease Father    Hypertension Brother        3 bother with HTN   Esophageal cancer Brother    Colon cancer Neg Hx    Rectal cancer Neg Hx    Stomach cancer Neg Hx     SOCIAL HISTORY: Social History   Socioeconomic History   Marital status: Married    Spouse name: Not on file   Number of children: 2   Years of education: Not on file   Highest education level: Bachelor's degree (e.g., BA, AB, BS)  Occupational History   Occupation: Copywriter, advertising: [employer]  Tobacco Use   Smoking status: Never   Smokeless tobacco: Never  Vaping Use   Vaping status: Never Used  Substance and Sexual Activity   Alcohol use: Yes    Comment: 0-1 per day   Drug use: No   Sexual activity: Yes    Birth control/protection: None  Other Topics Concern   Not on file  Social History Narrative   Not on file   Social Determinants of Health   Financial Resource Strain: Low Risk  (06/03/2022)   Overall Financial Resource Strain (CARDIA)    Difficulty of Paying Living Expenses: Not hard at all  Food Insecurity: No Food Insecurity (06/03/2022)   Hunger Vital Sign    Worried About Running Out of Food in the Last Year: Never true    Ran Out  of Food in the Last Year: Never true  Transportation Needs: No Transportation Needs (06/03/2022)   PRAPARE - Administrator, Civil Service (Medical): No    Lack of Transportation (Non-Medical): No  Physical Activity: Sufficiently Active (06/03/2022)   Exercise Vital Sign    Days of Exercise per Week: 7 days    Minutes of Exercise per Session: 60 min  Stress: No Stress Concern Present (06/03/2022)   [employer] of Occupational Health - Occupational Stress Questionnaire    Feeling of Stress : Only a little  Social Connections: Socially Integrated (06/03/2022)   Social Connection and Isolation Panel [NHANES]    Frequency of Communication with Friends and Family: More than three times a week    Frequency of  Social Gatherings with Friends and Family: Twice a week    Attends Religious Services: 1 to 4 times per year    Active Member of [employer] or Organizations: Yes    Attends Banker Meetings: 1 to 4 times per year    Marital Status: Married  Catering manager Violence: Not on file    Allergies  Allergen Reactions   Codeine Other (See Comments)    Confusion   Eplerenone Other (See Comments)    Breast tenderness   Spironolactone Other (See Comments)    Breast tenderness    Current Outpatient Medications  Medication Sig Dispense Refill   aspirin 81 MG EC tablet Take 81 mg by mouth daily.     dronedarone (MULTAQ) 400 MG tablet Take 1 tablet (400 mg total) by mouth 2 (two) times daily with a meal. 180 tablet 3   empagliflozin (JARDIANCE) 10 MG TABS tablet TAKE 1 TABLET(10 MG) BY MOUTH DAILY BEFORE BREAKFAST 90 tablet 1   ENTRESTO 97-103 MG TAKE 1 TABLET BY MOUTH TWICE DAILY 180 tablet 3   gabapentin (NEURONTIN) 300 MG capsule Take 1 capsule (300 mg total) by mouth 3 (three) times daily as needed. 90 capsule 3   metoprolol succinate (TOPROL-XL) 25 MG 24 hr tablet Take 1 tablet (25 mg total) by mouth daily. 90 tablet 3   rosuvastatin (CRESTOR) 20 MG tablet TAKE 1 TABLET(20 MG) BY MOUTH DAILY 100 tablet 2   traMADol (ULTRAM) 50 MG tablet Take 1 tablet (50 mg total) by mouth every 6 (six) hours as needed for up to 25 doses for severe pain. 25 tablet 0   No current facility-administered medications for this visit.    REVIEW OF SYSTEMS:  [X]  denotes positive finding, [ ]  denotes negative finding Cardiac  Comments:  Chest pain or chest pressure:    Shortness of breath upon exertion:    Short of breath when lying flat:    Irregular heart rhythm:        Vascular    Pain in calf, thigh, or hip brought on by ambulation:    Pain in feet at night that wakes you up from your sleep:     Blood clot in your veins:    Leg swelling:         Pulmonary    Oxygen at home:    Productive  cough:     Wheezing:         Neurologic    Sudden weakness in arms or legs:     Sudden numbness in arms or legs:     Sudden onset of difficulty speaking or slurred speech:    Temporary loss of vision in one eye:     Problems with dizziness:  Gastrointestinal    Blood in stool:     Vomited blood:         Genitourinary    Burning when urinating:     Blood in urine:        Psychiatric    Major depression:         Hematologic    Bleeding problems:    Problems with blood clotting too easily:        Skin    Rashes or ulcers:        Constitutional    Fever or chills:      PHYSICAL EXAM: There were no vitals filed for this visit.  GENERAL: The patient is a well-nourished male, in no acute distress. The vital signs are documented above. CARDIAC: There is a regular rate and rhythm.  VASCULAR:  Bilateral femoral pulses palpable Unable to appreciate any pedal pulses that are palpable No lower extremity tissue loss PULMONARY: No respiratory distress ABDOMEN: Soft and non-tender. MUSCULOSKELETAL: There are no major deformities or cyanosis. NEUROLOGIC: No focal weakness or paresthesias are detected. SKIN: There are no ulcers or rashes noted. PSYCHIATRIC: The patient has a normal affect.  DATA:   ABIs on 11/04/2022 that were noncompressible with multiphasic waveforms.   Assessment/Plan:  77 y.o. male, with history of systolic CHF (last EF 40-45%), hypertension, hyperlipidemia that presents for evaluation of PAD.  Ultimately his PAD is asymptomatic.  He does not endorse any lower extremity claudication and certainly has no evidence of CLI like rest pain or tissue loss.  His ABIs were noncompressible suggesting calcification and I cannot palpate any pedal pulses on exam.  I discussed for asymptomatic PAD it is safe for continued observation.  He is on aspirin statin for risk reduction.  I discussed exercise and walking therapies.  I will have him follow-up me in 2 years with  ABIs for surveillance.  Discussed if he develops any symptoms in his lower extremities to let me know.   Cephus Shelling, MD [facility] Office: [phone]

## 2022-11-11 DIAGNOSIS — H43811 Vitreous degeneration, right eye: Secondary | ICD-10-CM | POA: Diagnosis not present

## 2022-11-11 DIAGNOSIS — H25813 Combined forms of age-related cataract, bilateral: Secondary | ICD-10-CM | POA: Diagnosis not present

## 2022-11-11 DIAGNOSIS — H4311 Vitreous hemorrhage, right eye: Secondary | ICD-10-CM | POA: Diagnosis not present

## 2022-11-12 NOTE — Telephone Encounter (Signed)
Called pt advised of MD response:  There is no large data studying Jardiance as a 5 mg dose, because the studied doses were 10 mg and 25 mg.  Theoretically, it should not cause harm at a lower dose. My preference for him is to be on the 10 mg dose for that reason.  If he cuts the dose in half, and has worsening cardiac symptoms, we should try the FDA approved dosing.    Pt reports medication is really expensive.  Wants to know if there is any alternate drugs that are cheaper.

## 2022-11-13 ENCOUNTER — Encounter: Payer: Self-pay | Admitting: Family Medicine

## 2022-11-16 NOTE — Telephone Encounter (Signed)
There is a Designer, multimedia. I did discuss with patient. I applied and he was conditionally approved. He needs to submit proof of income. His latest tax return will be over the max amount because he retired in 2024. Therefore he will need to write a letter explaining how his situation has changed. Patient will work on this and get [place] the info so we can submit to healthwell.   Card No. [id number] Card Status Inactive BIN [id number] PCN [id number] [facility] [id number]

## 2022-11-20 ENCOUNTER — Ambulatory Visit
Admission: RE | Admit: 2022-11-20 | Discharge: 2022-11-20 | Disposition: A | Discharge status: Home / Self Care | Payer: Medicare HMO | Source: Ambulatory Visit | Attending: Family Medicine | Admitting: Family Medicine

## 2022-11-20 DIAGNOSIS — M25511 Pain in right shoulder: Secondary | ICD-10-CM | POA: Diagnosis not present

## 2022-11-20 DIAGNOSIS — G8929 Other chronic pain: Secondary | ICD-10-CM

## 2022-11-20 DIAGNOSIS — M7581 Other shoulder lesions, right shoulder: Secondary | ICD-10-CM | POA: Diagnosis not present

## 2022-12-01 NOTE — Progress Notes (Signed)
Right shoulder MRI shows tendinitis of the rotator cuff tendons with a tiny tear in the tendon.  This is not bad enough that I think you are going to need surgery.  Physical therapy and injection should be helpful.  If he cannot get better enough with that then we may want to consider surgery consultation.

## 2022-12-15 DIAGNOSIS — R001 Bradycardia, unspecified: Secondary | ICD-10-CM | POA: Insufficient documentation

## 2022-12-23 ENCOUNTER — Encounter: Payer: Self-pay | Admitting: Internal Medicine

## 2022-12-23 ENCOUNTER — Ambulatory Visit: Payer: Medicare HMO | Attending: Internal Medicine | Admitting: Internal Medicine

## 2022-12-23 VITALS — BP 130/66 | HR 52 | Wt 190.0 lb

## 2022-12-23 DIAGNOSIS — I472 Ventricular tachycardia, unspecified: Secondary | ICD-10-CM

## 2022-12-23 DIAGNOSIS — R001 Bradycardia, unspecified: Secondary | ICD-10-CM

## 2022-12-23 DIAGNOSIS — I493 Ventricular premature depolarization: Secondary | ICD-10-CM | POA: Diagnosis not present

## 2022-12-23 MED ORDER — EMPAGLIFLOZIN 10 MG PO TABS: ORAL_TABLET | ORAL | Status: DC

## 2022-12-23 NOTE — Progress Notes (Signed)
8.  8 BHU.  He will     Patient Care Team: Corwin Levins, MD as PCP - General (Internal Medicine) Duke Salvia, MD as PCP - Electrophysiology (Cardiology) Christell Constant, MD as PCP - Cardiology (Cardiology)   HPI  Thomas Caldwell is a 77 y.o. 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  cMRI was questioning the possibility of LV Many with prominent trabeculations  Flecainide initially used for PVC suppression complicated  left bundle branch block and discontinued Saw Dr. 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   The patient denies chest pain, shortness of breath, nocturnal dyspnea, orthopnea or peripheral edema.  There have been no palpitations, lightheadedness or syncop.  Complains of intolerance of Jardiance 10 because of tongue lesions and penile discomfort.       DATE TEST EF   9/21 Echo   30-35 %   9/21 LHC  <25 % LADm-60  10/21 cMRI 34% LV dilated Prominent trabeculations  ? LVNC LGE neg  12/21 Echo  30-35%   3/23 Echo  40-45%   11/23 cMRI 41% Less evidence of LVNC   Date PVCs  9/21 7.9  6/22 7.9   Date Cr K Hgb  12/21 1.2 4.7 14.4   3/23 1.35 4.4 14.4 (11/22)  8/24 1.24 3.9 15.9       Records and Results Reviewed   Past Medical History:  Diagnosis Date   ALLERGIC RHINITIS 01/11/2008   ANEURYSM 02/02/2007   brain   Arthritis    BRADYCARDIA, CHRONIC 10/06/2007   CHF (congestive heart failure) (HCC)    DIVERTICULOSIS, COLON 07/05/2007   EUSTACHIAN TUBE DYSFUNCTION 04/06/2009   GERD 07/05/2007   HYPERLIPIDEMIA 02/02/2007   HYPERTENSION 02/02/2007   Lumbago 01/11/2008   Peptic ulcer    PERSISTENT DISORDER INITIATING/MAINTAINING SLEEP 02/02/2007    Past Surgical History:  Procedure Laterality Date    APPENDECTOMY  1982   INGUINAL HERNIA REPAIR Left 08/27/2022   Procedure: LEFT HERNIA REPAIR INGUINAL;  Surgeon: Violeta Gelinas, MD;  Location[facility] OR;  Service: General;  Laterality: Left;   INSERTION OF MESH Left 08/27/2022   Procedure: INSERTION OF MESH;  Surgeon: Violeta Gelinas, MD;  Location: [facility] OR;  Service: General;  Laterality: Left;   LEFT HEART CATH AND CORONARY ANGIOGRAPHY N/A 10/04/2019   Procedure: LEFT HEART CATH AND CORONARY ANGIOGRAPHY;  Surgeon: Lennette Bihari, MD;  Location: [facility] INVASIVE CV LAB;  Service: Cardiovascular;  Laterality: N/A;   LUMBAR DISC SURGERY  12/2005   Dr. Venetia Maxon   [facility] Left brain  1992   due to brain aneurysm s/p repair    Current Meds  Medication Sig   aspirin 81 MG EC tablet Take 81 mg by mouth daily.   dronedarone (MULTAQ) 400 MG tablet Take 1 tablet (400 mg total) by mouth 2 (two) times daily with a meal.   empagliflozin (JARDIANCE) 10 MG TABS tablet TAKE 1 TABLET(10 MG) BY MOUTH DAILY BEFORE BREAKFAST   ENTRESTO 97-103 MG TAKE 1 TABLET BY MOUTH TWICE DAILY   gabapentin (NEURONTIN) 300 MG capsule Take 1 capsule (300 mg total) by mouth 3 (three) times daily as needed.   metoprolol succinate (TOPROL-XL) 25 MG 24 hr tablet Take 1 tablet (25 mg total) by mouth daily.   rosuvastatin (CRESTOR) 20  MG tablet TAKE 1 TABLET(20 MG) BY MOUTH DAILY   traMADol (ULTRAM) 50 MG tablet Take 1 tablet (50 mg total) by mouth every 6 (six) hours as needed for up to 25 doses for severe pain.    Allergies  Allergen Reactions   Codeine Other (See Comments)    Confusion   Eplerenone Other (See Comments)    Breast tenderness   Spironolactone Other (See Comments)    Breast tenderness      Review of Systems negative except from HPI and PMH  Physical Exam BP 130/66   Pulse (!) 52   Wt 190 lb (86.2 kg)   SpO2 97%   BMI 25.77 kg/m  Well developed and well nourished in no acute distress HENT normal Neck supple with JVP-flat Clear  Regular rate and rhythm, no   murmur Abd-soft with active BS No Clubbing cyanosis  edema Skin-warm and dry A & Oriented  Grossly normal sensory and motor function  ECG sinus at 52 16/10/44 Poor R wave progression     Assessment and  Plan  Ventricular tachycardia Nonsustained/PVCs  Dronaderone therapy    Cardiomyopathy previously but no longer thought to 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  Will continue  GDMT continued; unfortunately, Jardiance is associated with really disruptive lesions of his tongue and tip of his penis.  Will have him take 5 mg.  For ventricular tachycardia, continue with metoprolol

## 2022-12-23 NOTE — Patient Instructions (Signed)
Medication Instructions:  Your physician has recommended you make the following change in your medication:   Decrease Jardiance 10mg  to 1/2 tablet by mouth daily at breakfast.   *If you need a refill on your cardiac medications before your next appointment, please call your pharmacy*   Lab Work: None ordered.  If you have labs (blood work) drawn today and your tests are completely normal, you will receive your results only by: MyChart Message (if you have MyChart) OR A paper copy in the mail If you have any lab test that is abnormal or we need to change your treatment, we will call you to review the results.   Testing/Procedures: None ordered.    Follow-Up: At [facility], you and your health needs are our priority.  As part of our continuing mission to provide you with exceptional heart care, we have created designated Provider Care Teams.  These Care Teams include your primary Cardiologist (physician) and Advanced Practice Providers (APPs -  Physician Assistants and Nurse Practitioners) who all work together to provide you with the care you need, when you need it.  We recommend signing up for the patient portal called "MyChart".  Sign up information is provided on this After Visit Summary.  MyChart is used to connect with patients for Virtual Visits (Telemedicine).  Patients are able to view lab/test results, encounter notes, upcoming appointments, etc.  Non-urgent messages can be sent to your provider as well.   To learn more about what you can do with MyChart, go to [email or web address].    Your next appointment:   12 months with Dr Graciela Husbands

## 2023-01-07 DIAGNOSIS — M9902 Segmental and somatic dysfunction of thoracic region: Secondary | ICD-10-CM | POA: Diagnosis not present

## 2023-01-07 DIAGNOSIS — M50322 Other cervical disc degeneration at C5-C6 level: Secondary | ICD-10-CM | POA: Diagnosis not present

## 2023-01-07 DIAGNOSIS — M9901 Segmental and somatic dysfunction of cervical region: Secondary | ICD-10-CM | POA: Diagnosis not present

## 2023-01-07 DIAGNOSIS — M9903 Segmental and somatic dysfunction of lumbar region: Secondary | ICD-10-CM | POA: Diagnosis not present

## 2023-01-07 DIAGNOSIS — M47817 Spondylosis without myelopathy or radiculopathy, lumbosacral region: Secondary | ICD-10-CM | POA: Diagnosis not present

## 2023-01-07 DIAGNOSIS — M9904 Segmental and somatic dysfunction of sacral region: Secondary | ICD-10-CM | POA: Diagnosis not present

## 2023-01-07 DIAGNOSIS — M9905 Segmental and somatic dysfunction of pelvic region: Secondary | ICD-10-CM | POA: Diagnosis not present

## 2023-01-12 ENCOUNTER — Ambulatory Visit: Payer: Medicare HMO | Admitting: Family Medicine

## 2023-01-12 DIAGNOSIS — M9903 Segmental and somatic dysfunction of lumbar region: Secondary | ICD-10-CM | POA: Diagnosis not present

## 2023-01-12 DIAGNOSIS — M9905 Segmental and somatic dysfunction of pelvic region: Secondary | ICD-10-CM | POA: Diagnosis not present

## 2023-01-12 DIAGNOSIS — M9904 Segmental and somatic dysfunction of sacral region: Secondary | ICD-10-CM | POA: Diagnosis not present

## 2023-01-12 DIAGNOSIS — M9902 Segmental and somatic dysfunction of thoracic region: Secondary | ICD-10-CM | POA: Diagnosis not present

## 2023-01-12 DIAGNOSIS — M47817 Spondylosis without myelopathy or radiculopathy, lumbosacral region: Secondary | ICD-10-CM | POA: Diagnosis not present

## 2023-01-12 DIAGNOSIS — M9901 Segmental and somatic dysfunction of cervical region: Secondary | ICD-10-CM | POA: Diagnosis not present

## 2023-01-12 DIAGNOSIS — M50322 Other cervical disc degeneration at C5-C6 level: Secondary | ICD-10-CM | POA: Diagnosis not present

## 2023-01-14 DIAGNOSIS — M50322 Other cervical disc degeneration at C5-C6 level: Secondary | ICD-10-CM | POA: Diagnosis not present

## 2023-01-14 DIAGNOSIS — M9905 Segmental and somatic dysfunction of pelvic region: Secondary | ICD-10-CM | POA: Diagnosis not present

## 2023-01-14 DIAGNOSIS — M9901 Segmental and somatic dysfunction of cervical region: Secondary | ICD-10-CM | POA: Diagnosis not present

## 2023-01-14 DIAGNOSIS — M47817 Spondylosis without myelopathy or radiculopathy, lumbosacral region: Secondary | ICD-10-CM | POA: Diagnosis not present

## 2023-01-14 DIAGNOSIS — M9903 Segmental and somatic dysfunction of lumbar region: Secondary | ICD-10-CM | POA: Diagnosis not present

## 2023-01-14 DIAGNOSIS — M9902 Segmental and somatic dysfunction of thoracic region: Secondary | ICD-10-CM | POA: Diagnosis not present

## 2023-01-14 DIAGNOSIS — M9904 Segmental and somatic dysfunction of sacral region: Secondary | ICD-10-CM | POA: Diagnosis not present

## 2023-01-26 DIAGNOSIS — M9905 Segmental and somatic dysfunction of pelvic region: Secondary | ICD-10-CM | POA: Diagnosis not present

## 2023-01-26 DIAGNOSIS — M9901 Segmental and somatic dysfunction of cervical region: Secondary | ICD-10-CM | POA: Diagnosis not present

## 2023-01-26 DIAGNOSIS — M9903 Segmental and somatic dysfunction of lumbar region: Secondary | ICD-10-CM | POA: Diagnosis not present

## 2023-01-26 DIAGNOSIS — M50322 Other cervical disc degeneration at C5-C6 level: Secondary | ICD-10-CM | POA: Diagnosis not present

## 2023-01-26 DIAGNOSIS — M9904 Segmental and somatic dysfunction of sacral region: Secondary | ICD-10-CM | POA: Diagnosis not present

## 2023-01-26 DIAGNOSIS — M9902 Segmental and somatic dysfunction of thoracic region: Secondary | ICD-10-CM | POA: Diagnosis not present

## 2023-01-26 DIAGNOSIS — M47817 Spondylosis without myelopathy or radiculopathy, lumbosacral region: Secondary | ICD-10-CM | POA: Diagnosis not present

## 2023-01-28 DIAGNOSIS — M9905 Segmental and somatic dysfunction of pelvic region: Secondary | ICD-10-CM | POA: Diagnosis not present

## 2023-01-28 DIAGNOSIS — M9902 Segmental and somatic dysfunction of thoracic region: Secondary | ICD-10-CM | POA: Diagnosis not present

## 2023-01-28 DIAGNOSIS — M9903 Segmental and somatic dysfunction of lumbar region: Secondary | ICD-10-CM | POA: Diagnosis not present

## 2023-01-28 DIAGNOSIS — M50322 Other cervical disc degeneration at C5-C6 level: Secondary | ICD-10-CM | POA: Diagnosis not present

## 2023-01-28 DIAGNOSIS — M9904 Segmental and somatic dysfunction of sacral region: Secondary | ICD-10-CM | POA: Diagnosis not present

## 2023-01-28 DIAGNOSIS — M9901 Segmental and somatic dysfunction of cervical region: Secondary | ICD-10-CM | POA: Diagnosis not present

## 2023-01-28 DIAGNOSIS — M47817 Spondylosis without myelopathy or radiculopathy, lumbosacral region: Secondary | ICD-10-CM | POA: Diagnosis not present

## 2023-02-02 DIAGNOSIS — M50322 Other cervical disc degeneration at C5-C6 level: Secondary | ICD-10-CM | POA: Diagnosis not present

## 2023-02-02 DIAGNOSIS — M47817 Spondylosis without myelopathy or radiculopathy, lumbosacral region: Secondary | ICD-10-CM | POA: Diagnosis not present

## 2023-02-02 DIAGNOSIS — M9902 Segmental and somatic dysfunction of thoracic region: Secondary | ICD-10-CM | POA: Diagnosis not present

## 2023-02-02 DIAGNOSIS — M9903 Segmental and somatic dysfunction of lumbar region: Secondary | ICD-10-CM | POA: Diagnosis not present

## 2023-02-02 DIAGNOSIS — M9901 Segmental and somatic dysfunction of cervical region: Secondary | ICD-10-CM | POA: Diagnosis not present

## 2023-02-02 DIAGNOSIS — M9904 Segmental and somatic dysfunction of sacral region: Secondary | ICD-10-CM | POA: Diagnosis not present

## 2023-02-02 DIAGNOSIS — M9905 Segmental and somatic dysfunction of pelvic region: Secondary | ICD-10-CM | POA: Diagnosis not present

## 2023-02-03 ENCOUNTER — Ambulatory Visit: Payer: Medicare HMO | Admitting: Family Medicine

## 2023-02-04 DIAGNOSIS — M47817 Spondylosis without myelopathy or radiculopathy, lumbosacral region: Secondary | ICD-10-CM | POA: Diagnosis not present

## 2023-02-04 DIAGNOSIS — M50322 Other cervical disc degeneration at C5-C6 level: Secondary | ICD-10-CM | POA: Diagnosis not present

## 2023-02-04 DIAGNOSIS — M9903 Segmental and somatic dysfunction of lumbar region: Secondary | ICD-10-CM | POA: Diagnosis not present

## 2023-02-04 DIAGNOSIS — M9902 Segmental and somatic dysfunction of thoracic region: Secondary | ICD-10-CM | POA: Diagnosis not present

## 2023-02-04 DIAGNOSIS — M9905 Segmental and somatic dysfunction of pelvic region: Secondary | ICD-10-CM | POA: Diagnosis not present

## 2023-02-04 DIAGNOSIS — M9901 Segmental and somatic dysfunction of cervical region: Secondary | ICD-10-CM | POA: Diagnosis not present

## 2023-02-04 DIAGNOSIS — M9904 Segmental and somatic dysfunction of sacral region: Secondary | ICD-10-CM | POA: Diagnosis not present

## 2023-02-08 ENCOUNTER — Ambulatory Visit (INDEPENDENT_AMBULATORY_CARE_PROVIDER_SITE_OTHER): Payer: Medicare HMO | Admitting: Family Medicine

## 2023-02-08 ENCOUNTER — Other Ambulatory Visit: Payer: Self-pay

## 2023-02-08 ENCOUNTER — Ambulatory Visit (INDEPENDENT_AMBULATORY_CARE_PROVIDER_SITE_OTHER): Payer: Medicare HMO

## 2023-02-08 VITALS — BP 136/84 | HR 56 | Ht 72.0 in | Wt 192.0 lb

## 2023-02-08 DIAGNOSIS — M79642 Pain in left hand: Secondary | ICD-10-CM

## 2023-02-08 DIAGNOSIS — M1812 Unilateral primary osteoarthritis of first carpometacarpal joint, left hand: Secondary | ICD-10-CM | POA: Diagnosis not present

## 2023-02-08 DIAGNOSIS — M25511 Pain in right shoulder: Secondary | ICD-10-CM

## 2023-02-08 DIAGNOSIS — M25561 Pain in right knee: Secondary | ICD-10-CM | POA: Diagnosis not present

## 2023-02-08 DIAGNOSIS — M25562 Pain in left knee: Secondary | ICD-10-CM | POA: Diagnosis not present

## 2023-02-08 DIAGNOSIS — G8929 Other chronic pain: Secondary | ICD-10-CM | POA: Diagnosis not present

## 2023-02-08 NOTE — Patient Instructions (Addendum)
 Thank you for coming in today.   You received an injection today. Seek immediate medical attention if the joint becomes red, extremely painful, or is oozing fluid.   Please use Voltaren  gel (Generic Diclofenac  Gel) up to 4x daily for pain as needed.  This is available over-the-counter as both the name brand Voltaren  gel and the generic diclofenac  gel.   We can add hand therapy if needed.

## 2023-02-08 NOTE — Progress Notes (Signed)
 LILLETTE Ileana Collet, PhD, LAT, ATC acting as a scribe for Artist Lloyd, MD.  Thomas Caldwell is a 78 y.o. male who presents to [facility] today for cont'd bilat knee and shoulder pain. Pt was last seen by Dr. Lloyd on 10/22/22 and was given bilat knee steroid injections. He later exchanged MyChart messages about cont'd shoulder pain and MRI was ordered.   Today, pt reports R shoulder pain continues. Pain w/ certain motions, esp a golf swing. Bilat knee pain returned sometime in Dec. He's leaving Thursday for a golf trip to [place].  Pt also c/o L thumb pain ongoing for about 3 months. Pt locates pain to the MCP joint of the L thumb.   Dx imaging: 09/28/2020 R knee MRI 09/11/2020 R knee XR 09/08/2020 L knee MRI 08/28/2020 R knee XR 07/10/2020 L knee XR 03/23/2019 R knee XR  Pertinent review of systems: No fevers or chills  Relevant historical information: History of heart disease   Exam:  BP 136/84   Pulse (!) 56   Ht 6' (1.829 m)   Wt 192 lb (87.1 kg)   SpO2 97%   BMI 26.04 kg/m  General: Well Developed, well nourished, and in no acute distress.   MSK: Right shoulder normal-appearing normal motion pain with abduction intact strength.  Left thumb some swelling at MCP.  Tender palpation decreased thumb motion.  Intact strength.  No triggering present.  Knees bilaterally decreased quadricep bulk.  Normal motion.    Lab and Radiology Results  Procedure: Real-time Ultrasound Guided Injection of right shoulder subacromial bursa Device: Philips Affiniti 50G/GE Logiq Images permanently stored and available for review in PACS Verbal informed consent obtained.  Discussed risks and benefits of procedure. Warned about infection, bleeding, hyperglycemia damage to structures among others. Patient expresses understanding and agreement Time-out conducted.   Noted no overlying erythema, induration, or other signs of local infection.   Skin prepped in a  sterile fashion.   Local anesthesia: Topical Ethyl chloride.   With sterile technique and under real time ultrasound guidance: 40 mg of Kenalog  and 2 mL of Marcaine  injected into subacromial bursa. Fluid seen entering the bursa.   Completed without difficulty   Pain immediately resolved suggesting accurate placement of the medication.   Advised to call if fevers/chills, erythema, induration, drainage, or persistent bleeding.   Images permanently stored and available for review in the ultrasound unit.  Impression: Technically successful ultrasound guided injection.    Procedure: Real-time Ultrasound Guided Injection of left hand first MCP Device: Philips Affiniti 50G/GE Logiq Images permanently stored and available for review in PACS Verbal informed consent obtained.  Discussed risks and benefits of procedure. Warned about infection, bleeding, hyperglycemia damage to structures among others. Patient expresses understanding and agreement Time-out conducted.   Noted no overlying erythema, induration, or other signs of local infection.   Skin prepped in a sterile fashion.   Local anesthesia: Topical Ethyl chloride.   With sterile technique and under real time ultrasound guidance: 40 mg of Kenalog  and 1 mL of lidocaine  injected into first MCP. Fluid seen entering the joint capsule.   Completed without difficulty   Pain immediately resolved suggesting accurate placement of the medication.   Advised to call if fevers/chills, erythema, induration, drainage, or persistent bleeding.   Images permanently stored and available for review in the ultrasound unit.  Impression: Technically successful ultrasound guided injection.    X-ray images left hand obtained today personally and independently interpreted Mild  DJD first MCP.  No acute fractures Await formal radiology review   EXAM: MRI OF THE RIGHT SHOULDER WITHOUT CONTRAST   TECHNIQUE: Multiplanar, multisequence MR imaging of the shoulder  was performed. No intravenous contrast was administered.   COMPARISON:  None Available.   FINDINGS: Rotator cuff: Mild tendinosis of the supraspinatus tendon. Mild tendinosis of the infraspinatus tendon with a tiny insertional interstitial tear. Teres minor tendon is intact. Subscapularis tendon is intact.   Muscles: No muscle atrophy or edema. No intramuscular fluid collection or hematoma.   Biceps Long Head: Intraarticular and extraarticular portions of the biceps tendon are intact.   Acromioclavicular Joint: Moderate arthropathy of the acromioclavicular joint. No subacromial/subdeltoid bursal fluid.   Glenohumeral Joint: No joint effusion. No chondral defect.   Labrum: Grossly intact, but evaluation is limited by lack of intraarticular fluid/contrast.   Bones: No fracture or dislocation. No aggressive osseous lesion.   Other: No fluid collection or hematoma.   IMPRESSION: 1. Mild tendinosis of the supraspinatus tendon. 2. Mild tendinosis of the infraspinatus tendon with a tiny insertional interstitial tear.     Electronically Signed   By: Julaine Blanch M.D.   On: 11/30/2022 07:58 I, Artist Lloyd, personally (independently) visualized and performed the interpretation of the images attached in this note.    Assessment and Plan: 78 y.o. male with chronic right shoulder pain.  This is an exacerbation of chronic problem due to rotator cuff tendinopathy.  Plan for subacromial injection today and continued home exercise program.  Consider formal physical therapy.  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  Consider occupational therapy.  Chronic bilateral knee pain ongoing issue due to quadricep partial tendon tear.  Done well in the past with PT and ultimately PRP injections.  Consider steroid injection in the future.  Deferred steroid injection today.  4 injections is too many injections 1 day.   PDMP not reviewed this encounter. Orders Placed This  Encounter  Procedures   US  LIMITED JOINT SPACE STRUCTURES LOW BILAT(NO LINKED CHARGES)    Reason for Exam (SYMPTOM  OR DIAGNOSIS REQUIRED):   bilateral knee pain    Preferred imaging location?:   [facility] Hand Complete Left    Standing Status:   Future    Number of Occurrences:   1    Expiration Date:   02/08/2024    Reason for Exam (SYMPTOM  OR DIAGNOSIS REQUIRED):   eval pain left thumb    Preferred imaging location?:   [address]   No orders of the defined types were placed in this encounter.    Discussed warning signs or symptoms. Please see discharge instructions. Patient expresses understanding.   The above documentation has been reviewed and is accurate and complete Artist Lloyd, M.D.

## 2023-02-09 DIAGNOSIS — M9902 Segmental and somatic dysfunction of thoracic region: Secondary | ICD-10-CM | POA: Diagnosis not present

## 2023-02-09 DIAGNOSIS — M9905 Segmental and somatic dysfunction of pelvic region: Secondary | ICD-10-CM | POA: Diagnosis not present

## 2023-02-09 DIAGNOSIS — M50322 Other cervical disc degeneration at C5-C6 level: Secondary | ICD-10-CM | POA: Diagnosis not present

## 2023-02-09 DIAGNOSIS — M47817 Spondylosis without myelopathy or radiculopathy, lumbosacral region: Secondary | ICD-10-CM | POA: Diagnosis not present

## 2023-02-09 DIAGNOSIS — M9901 Segmental and somatic dysfunction of cervical region: Secondary | ICD-10-CM | POA: Diagnosis not present

## 2023-02-09 DIAGNOSIS — M9904 Segmental and somatic dysfunction of sacral region: Secondary | ICD-10-CM | POA: Diagnosis not present

## 2023-02-09 DIAGNOSIS — M9903 Segmental and somatic dysfunction of lumbar region: Secondary | ICD-10-CM | POA: Diagnosis not present

## 2023-02-11 DIAGNOSIS — M9901 Segmental and somatic dysfunction of cervical region: Secondary | ICD-10-CM | POA: Diagnosis not present

## 2023-02-11 DIAGNOSIS — M9904 Segmental and somatic dysfunction of sacral region: Secondary | ICD-10-CM | POA: Diagnosis not present

## 2023-02-11 DIAGNOSIS — M47817 Spondylosis without myelopathy or radiculopathy, lumbosacral region: Secondary | ICD-10-CM | POA: Diagnosis not present

## 2023-02-11 DIAGNOSIS — M9902 Segmental and somatic dysfunction of thoracic region: Secondary | ICD-10-CM | POA: Diagnosis not present

## 2023-02-11 DIAGNOSIS — M50322 Other cervical disc degeneration at C5-C6 level: Secondary | ICD-10-CM | POA: Diagnosis not present

## 2023-02-11 DIAGNOSIS — M9903 Segmental and somatic dysfunction of lumbar region: Secondary | ICD-10-CM | POA: Diagnosis not present

## 2023-02-11 DIAGNOSIS — M9905 Segmental and somatic dysfunction of pelvic region: Secondary | ICD-10-CM | POA: Diagnosis not present

## 2023-02-15 ENCOUNTER — Encounter: Payer: Self-pay | Admitting: Family Medicine

## 2023-02-15 NOTE — Progress Notes (Signed)
Left hand screw shows some arthritis especially at the base of the thumb.

## 2023-02-23 DIAGNOSIS — M9901 Segmental and somatic dysfunction of cervical region: Secondary | ICD-10-CM | POA: Diagnosis not present

## 2023-02-23 DIAGNOSIS — M47817 Spondylosis without myelopathy or radiculopathy, lumbosacral region: Secondary | ICD-10-CM | POA: Diagnosis not present

## 2023-02-23 DIAGNOSIS — M9902 Segmental and somatic dysfunction of thoracic region: Secondary | ICD-10-CM | POA: Diagnosis not present

## 2023-02-23 DIAGNOSIS — M9905 Segmental and somatic dysfunction of pelvic region: Secondary | ICD-10-CM | POA: Diagnosis not present

## 2023-02-23 DIAGNOSIS — M50322 Other cervical disc degeneration at C5-C6 level: Secondary | ICD-10-CM | POA: Diagnosis not present

## 2023-02-23 DIAGNOSIS — M9904 Segmental and somatic dysfunction of sacral region: Secondary | ICD-10-CM | POA: Diagnosis not present

## 2023-02-23 DIAGNOSIS — M9903 Segmental and somatic dysfunction of lumbar region: Secondary | ICD-10-CM | POA: Diagnosis not present

## 2023-02-25 DIAGNOSIS — M9904 Segmental and somatic dysfunction of sacral region: Secondary | ICD-10-CM | POA: Diagnosis not present

## 2023-02-25 DIAGNOSIS — M50322 Other cervical disc degeneration at C5-C6 level: Secondary | ICD-10-CM | POA: Diagnosis not present

## 2023-02-25 DIAGNOSIS — M47817 Spondylosis without myelopathy or radiculopathy, lumbosacral region: Secondary | ICD-10-CM | POA: Diagnosis not present

## 2023-02-25 DIAGNOSIS — M9901 Segmental and somatic dysfunction of cervical region: Secondary | ICD-10-CM | POA: Diagnosis not present

## 2023-02-25 DIAGNOSIS — M9902 Segmental and somatic dysfunction of thoracic region: Secondary | ICD-10-CM | POA: Diagnosis not present

## 2023-02-25 DIAGNOSIS — M9905 Segmental and somatic dysfunction of pelvic region: Secondary | ICD-10-CM | POA: Diagnosis not present

## 2023-02-25 DIAGNOSIS — M9903 Segmental and somatic dysfunction of lumbar region: Secondary | ICD-10-CM | POA: Diagnosis not present

## 2023-03-02 DIAGNOSIS — M9901 Segmental and somatic dysfunction of cervical region: Secondary | ICD-10-CM | POA: Diagnosis not present

## 2023-03-02 DIAGNOSIS — M47817 Spondylosis without myelopathy or radiculopathy, lumbosacral region: Secondary | ICD-10-CM | POA: Diagnosis not present

## 2023-03-02 DIAGNOSIS — M9905 Segmental and somatic dysfunction of pelvic region: Secondary | ICD-10-CM | POA: Diagnosis not present

## 2023-03-02 DIAGNOSIS — M9903 Segmental and somatic dysfunction of lumbar region: Secondary | ICD-10-CM | POA: Diagnosis not present

## 2023-03-02 DIAGNOSIS — M9902 Segmental and somatic dysfunction of thoracic region: Secondary | ICD-10-CM | POA: Diagnosis not present

## 2023-03-02 DIAGNOSIS — M9904 Segmental and somatic dysfunction of sacral region: Secondary | ICD-10-CM | POA: Diagnosis not present

## 2023-03-02 DIAGNOSIS — M50322 Other cervical disc degeneration at C5-C6 level: Secondary | ICD-10-CM | POA: Diagnosis not present

## 2023-04-04 ENCOUNTER — Encounter: Payer: Self-pay | Admitting: Internal Medicine

## 2023-04-05 ENCOUNTER — Other Ambulatory Visit: Payer: Self-pay | Admitting: *Deleted

## 2023-04-05 ENCOUNTER — Other Ambulatory Visit: Payer: Self-pay

## 2023-04-05 MED ORDER — ROSUVASTATIN CALCIUM 20 MG PO TABS: ORAL_TABLET | ORAL | 2 refills | Status: DC

## 2023-04-05 MED ORDER — METOPROLOL SUCCINATE ER 25 MG PO TB24: 25.0000 mg | ORAL_TABLET | Freq: Every day | ORAL | 3 refills | Status: AC

## 2023-04-05 MED ORDER — DRONEDARONE HCL 400 MG PO TABS: 400.0000 mg | ORAL_TABLET | Freq: Two times a day (BID) | ORAL | 0 refills | Status: DC

## 2023-04-05 MED ORDER — DRONEDARONE HCL 400 MG PO TABS: 400.0000 mg | ORAL_TABLET | Freq: Two times a day (BID) | ORAL | 3 refills | Status: DC

## 2023-04-20 ENCOUNTER — Other Ambulatory Visit: Payer: Self-pay

## 2023-04-20 ENCOUNTER — Telehealth: Payer: Self-pay | Admitting: Pharmacist

## 2023-04-20 DIAGNOSIS — I5022 Chronic systolic (congestive) heart failure: Secondary | ICD-10-CM

## 2023-04-20 MED ORDER — EMPAGLIFLOZIN 10 MG PO TABS: ORAL_TABLET | ORAL | Status: DC

## 2023-04-20 NOTE — Telephone Encounter (Signed)
-----   Message from [place]  [place] sent at 04/20/2023 12:20 PM EDT ----- Good afternoon,   I spoke with this patient today as apart of our [employer]. The patient reported that he was instructed by Dr. Graciela Husbands (cardiologist) to take half tablet of his jardiance 10mg  due to adverse events (lesions) to the higher dosage. The patient reports to tolerating the lower dose just fine. Is it possible to send a new Rx with an updated sig to reflect how the pt is taking it (ie take 0.5 tablets po daily)? Thank you!  Verdene Rio, PharmD PGY1 Pharmacy Resident

## 2023-04-20 NOTE — Telephone Encounter (Signed)
 Updating Jardiance prescription

## 2023-04-21 ENCOUNTER — Other Ambulatory Visit: Payer: Self-pay

## 2023-04-21 NOTE — Progress Notes (Unsigned)
 This patient is appearing on a report for being at risk of failing the adherence measure for cholesterol (statin) and diabetes medications this calendar year.   Medication: Jardiance 10mg  daily Last fill date: 03/31/2023 for 90 day supply  Medication: Rosuvastatin 20mg  daily  Last fill date: 03/31/2023 for 90 day supply  Discussed barriers to adherence, which included tongue lesions with jardiance, which has now resolved. Patient reported that he was instructed to restart with taking half tablet (5mg ) by Dr. Graciela Husbands (cardiology). Reviewed medication indication, dosing, and goals of therapy  Patient requests change in prescription to note that he is taking 0.5 tablet daily. On 3/25, a message requesting this change was sent to the [facility] pool. Will collaborate with embedded pharmacist to complete this request.   Verdene Rio, PharmD PGY1 Pharmacy Resident

## 2023-04-21 NOTE — Progress Notes (Signed)
 This patient is appearing on a report for being at risk of failing the adherence measure for cholesterol (statin) and diabetes medications this calendar year.   Medication: Jardiance 10mg  daily Last fill date: 03/31/2023 for 90 day supply  Medication: Rosuvastatin 20mg  daily  Last fill date: 03/31/2023 for 90 day supply  - Discussed barriers to adherence, which included tongue and genitalia lesions with jardiance, which has now resolved. Patient reported that he was instructed to restart with taking half tablet (5mg ) by Dr. Graciela Husbands (cardiology) - is tolerating well.  - Reviewed medication indication, dosing, and goals of therapy - Patient requests new prescription for Jardiance to with updated directions in sig (0.5 tablet by mouth daily). On 04/20/23, a message requesting this change was sent to the [facility] pool. Will collaborate with embedded pharmacist to complete this request.   Verdene Rio, PharmD PGY1 Pharmacy Resident

## 2023-06-07 DIAGNOSIS — Z961 Presence of intraocular lens: Secondary | ICD-10-CM | POA: Diagnosis not present

## 2023-06-07 DIAGNOSIS — H43813 Vitreous degeneration, bilateral: Secondary | ICD-10-CM | POA: Diagnosis not present

## 2023-06-07 DIAGNOSIS — H25811 Combined forms of age-related cataract, right eye: Secondary | ICD-10-CM | POA: Diagnosis not present

## 2023-06-15 ENCOUNTER — Ambulatory Visit

## 2023-06-18 ENCOUNTER — Telehealth: Payer: Self-pay | Admitting: Internal Medicine

## 2023-06-18 ENCOUNTER — Ambulatory Visit

## 2023-06-18 NOTE — Telephone Encounter (Signed)
 Copied from CRM [id number]. Topic: Medicare AWV >> Jun 18, 2023  8:13 AM Juliana Ocean wrote: Reason for CRM: LVM on both # to move awv appt from 8:50am to 2:40pm w/Lorrie Rosalee Collins; Care Guide Ambulatory Clinical Support [facility] Direct Dial: [phone]

## 2023-06-22 ENCOUNTER — Encounter: Payer: Self-pay | Admitting: Internal Medicine

## 2023-06-27 ENCOUNTER — Other Ambulatory Visit: Payer: Self-pay | Admitting: Internal Medicine

## 2023-07-12 NOTE — Telephone Encounter (Signed)
 FYI- pt following up

## 2023-07-13 ENCOUNTER — Ambulatory Visit: Admitting: Family Medicine

## 2023-07-13 ENCOUNTER — Encounter: Payer: Self-pay | Admitting: Family Medicine

## 2023-07-13 ENCOUNTER — Other Ambulatory Visit: Payer: Self-pay

## 2023-07-13 VITALS — BP 120/78 | HR 55 | Ht 72.0 in | Wt 183.0 lb

## 2023-07-13 DIAGNOSIS — M25511 Pain in right shoulder: Secondary | ICD-10-CM | POA: Diagnosis not present

## 2023-07-13 DIAGNOSIS — M25561 Pain in right knee: Secondary | ICD-10-CM | POA: Diagnosis not present

## 2023-07-13 DIAGNOSIS — K080 Exfoliation of teeth due to systemic causes: Secondary | ICD-10-CM | POA: Diagnosis not present

## 2023-07-13 DIAGNOSIS — M25562 Pain in left knee: Secondary | ICD-10-CM | POA: Diagnosis not present

## 2023-07-13 DIAGNOSIS — G8929 Other chronic pain: Secondary | ICD-10-CM

## 2023-07-13 NOTE — Patient Instructions (Addendum)
 Thank you for coming in today.   [facility]  Call or go to the ER if you develop a large red swollen joint with extreme pain or oozing puss.    Return as needed

## 2023-07-13 NOTE — Progress Notes (Signed)
 I, Miquel Amen, CMA acting as a scribe for Thomas Juniper, MD.  Zae Kirtz is a 78 y.o. male who presents to [facility] today for exacerbation of his bilat knee and shoulder pain. Pt was last seen by Dr. Alease Hunter on 02/08/23 and was given a R subacromial steroid injection. Last bilat knee steroid injections, 10/22/22  Today, pt reports exacerbations of bilat knee pain. Got about 6 months relief after last injection. Taking tylenol  prn. Denies swelling. Reports mechanical sx, worse with standing. Also c/o right shoulder. Locates pain to posterior aspect of the shoulder. ROM well maintained but pain when reaching overhead.   Dx imaging: 11/20/22 R shoulder MRI 09/28/2020 R knee MRI 09/11/2020 R knee XR 09/08/2020 L knee MRI  Pertinent review of systems: No fevers or chills  Relevant historical information: Partial tear quad tendons bilaterally   Exam:  BP 120/78   Pulse (!) 55   Ht 6' (1.829 m)   Wt 183 lb (83 kg)   SpO2 98%   BMI 24.82 kg/m  General: Well Developed, well nourished, and in no acute distress.   MSK: Right shoulder normal-appearing normal motion pain with abduction.  Bilateral knees mild effusion normal-appearing otherwise normal motion.    Lab and Radiology Results  Procedure: Real-time Ultrasound Guided Injection of right knee joint superior lateral patella space Device: Philips Affiniti 50G/GE Logiq Images permanently stored and available for review in PACS Verbal informed consent obtained.  Discussed risks and benefits of procedure. Warned about infection, bleeding, hyperglycemia damage to structures among others. Patient expresses understanding and agreement Time-out conducted.   Noted no overlying erythema, induration, or other signs of local infection.   Skin prepped in a sterile fashion.   Local anesthesia: Topical Ethyl chloride.   With sterile technique and under real time ultrasound guidance: 40 mg of Kenalog  and 2  mL of Marcaine  injected into knee joint. Fluid seen entering the joint capsule.   Completed without difficulty   Pain immediately resolved suggesting accurate placement of the medication.   Advised to call if fevers/chills, erythema, induration, drainage, or persistent bleeding.   Images permanently stored and available for review in the ultrasound unit.  Impression: Technically successful ultrasound guided injection.   Procedure: Real-time Ultrasound Guided Injection of left knee joint superior lateral patella space Device: Philips Affiniti 50G/GE Logiq Images permanently stored and available for review in PACS Verbal informed consent obtained.  Discussed risks and benefits of procedure. Warned about infection, bleeding, hyperglycemia damage to structures among others. Patient expresses understanding and agreement Time-out conducted.   Noted no overlying erythema, induration, or other signs of local infection.   Skin prepped in a sterile fashion.   Local anesthesia: Topical Ethyl chloride.   With sterile technique and under real time ultrasound guidance: 40 mg of Kenalog  and 2 mL of Marcaine  injected into knee joint. Fluid seen entering the joint capsule.   Completed without difficulty   Pain immediately resolved suggesting accurate placement of the medication.   Advised to call if fevers/chills, erythema, induration, drainage, or persistent bleeding.   Images permanently stored and available for review in the ultrasound unit.  Impression: Technically successful ultrasound guided injection.   Procedure: Real-time Ultrasound Guided Injection of right shoulder subacromial bursa Device: Philips Affiniti 50G/GE Logiq Images permanently stored and available for review in PACS Verbal informed consent obtained.  Discussed risks and benefits of procedure. Warned about infection, bleeding, hyperglycemia damage to structures among others. Patient expresses understanding and agreement  Time-out  conducted.   Noted no overlying erythema, induration, or other signs of local infection.   Skin prepped in a sterile fashion.   Local anesthesia: Topical Ethyl chloride.   With sterile technique and under real time ultrasound guidance: 40 mg of Kenalog  and 2 mL of Marcaine  injected into right shoulder subacromial bursa. Fluid seen entering the bursa.   Completed without difficulty   Pain immediately resolved suggesting accurate placement of the medication.   Advised to call if fevers/chills, erythema, induration, drainage, or persistent bleeding.   Images permanently stored and available for review in the ultrasound unit.  Impression: Technically successful ultrasound guided injection.        Assessment and Plan: 78 y.o. male with bilateral knee and right shoulder pain.  These are all chronic issues.  He had a similar injection for this in September 2024.  Plan for repeat steroid injections today.  Recheck back as needed.     PDMP not reviewed this encounter. Orders Placed This Encounter  Procedures   US  LIMITED JOINT SPACE STRUCTURES LOW BILAT(NO LINKED CHARGES)    Reason for Exam (SYMPTOM  OR DIAGNOSIS REQUIRED):   knee pain    Preferred imaging location?:   [facility] [place]   No orders of the defined types were placed in this encounter.    Discussed warning signs or symptoms. Please see discharge instructions. Patient expresses understanding.   The above documentation has been reviewed and is accurate and complete Thomas Caldwell, M.D.

## 2023-07-20 ENCOUNTER — Ambulatory Visit: Admitting: Internal Medicine

## 2023-07-20 ENCOUNTER — Encounter: Admitting: Internal Medicine

## 2023-07-27 DIAGNOSIS — R0981 Nasal congestion: Secondary | ICD-10-CM | POA: Diagnosis not present

## 2023-07-27 DIAGNOSIS — I5020 Unspecified systolic (congestive) heart failure: Secondary | ICD-10-CM | POA: Diagnosis not present

## 2023-07-27 DIAGNOSIS — Z1330 Encounter for screening examination for mental health and behavioral disorders, unspecified: Secondary | ICD-10-CM | POA: Diagnosis not present

## 2023-07-27 DIAGNOSIS — E559 Vitamin D deficiency, unspecified: Secondary | ICD-10-CM | POA: Diagnosis not present

## 2023-07-27 DIAGNOSIS — R001 Bradycardia, unspecified: Secondary | ICD-10-CM | POA: Diagnosis not present

## 2023-08-10 ENCOUNTER — Ambulatory Visit: Admitting: Cardiology

## 2023-08-10 DIAGNOSIS — K080 Exfoliation of teeth due to systemic causes: Secondary | ICD-10-CM | POA: Diagnosis not present

## 2023-08-11 ENCOUNTER — Other Ambulatory Visit: Payer: Self-pay | Admitting: Internal Medicine

## 2023-08-11 DIAGNOSIS — Z961 Presence of intraocular lens: Secondary | ICD-10-CM | POA: Diagnosis not present

## 2023-08-11 DIAGNOSIS — H25811 Combined forms of age-related cataract, right eye: Secondary | ICD-10-CM | POA: Diagnosis not present

## 2023-08-11 DIAGNOSIS — H43813 Vitreous degeneration, bilateral: Secondary | ICD-10-CM | POA: Diagnosis not present

## 2023-08-16 ENCOUNTER — Encounter: Payer: Self-pay | Admitting: Internal Medicine

## 2023-08-16 NOTE — Progress Notes (Signed)
 Electrophysiology Clinic Note    Date:  08/17/2023  Patient ID:  Binnie, Vonderhaar 09-12-1945, MRN [id number] PCP:  Norleen Lynwood ORN, MD  Cardiologist:  Stanly DELENA Leavens, MD   Electrophysiologist:  Dr. Fernande > OLE ONEIDA HOLTS, MD   Discussed the use of AI scribe software for clinical note transcription with the patient, who gave verbal consent to proceed.   Patient Profile    Chief Complaint: PVC follow-up  History of Present Illness: Rasean Joos is a 78 y.o. 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   He previously saw Dr. 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  He last saw Dr. Fernande 11/2022. PVCs quiescent on multaq .    On follow-up today, he felt particularly unwell on July 4th while hosting a cookout at his home. His watch alerted him that his pulse was low, and he felt weak and like  his legs were heavy. Since July 4th, his watch has alerted him to low HR below 50 a couple times both during day and night hours, but he has not had any associated symptoms like at the cookout. He has not had any medication changes, continues to take multaq  BID and toprol  25mg  daily. No missed medications.  He drinks decaf coffee, recently retired so the only stress he has is with his golf game. He does not drink high amt of ETOH.   Currently, he feels well without chest pain, chest pressure, palpitations, SOB, or presyncope.    Arrhythmia/Device History Flecainide  - stopped d/t LBBB  Multaq     ROS:  Please see the history of present illness. All other systems are reviewed and otherwise negative.    Physical Exam    VS:  BP (!) 144/70 (BP Location: Left Arm, Patient Position: Sitting, Cuff Size: Normal)   Pulse (!) 52   Resp 18   Ht 5' 11  (1.803 m)   Wt 180 lb (81.6 kg)   SpO2 99%   BMI 25.10 kg/m  BMI: Body mass index is 25.1 kg/m.      Wt Readings from Last 3 Encounters:  08/17/23 180 lb (81.6 kg)  07/13/23 183 lb (83 kg)  02/08/23 192 lb (87.1 kg)     GEN- The patient is well appearing, alert and oriented x 3 today.   Lungs- Clear to ausculation bilaterally, normal work of breathing.  Heart- Regularly irregular rate and rhythm, no murmurs, rubs or gallops Extremities- No peripheral edema, warm, dry    Studies Reviewed   Previous EP, cardiology notes.    EKG is ordered. Personal review of EKG from today shows:    EKG Interpretation Date/Time:  Tuesday August 17 2023 13:08:27 EDT Ventricular Rate:  52 PR Interval:  186 QRS Duration:  98 QT Interval:  436 QTC Calculation: 405 R Axis:   -13  Text Interpretation: Sinus bradycardia with occasional Premature ventricular complexes Confirmed by Caliya Narine [phone]) on 08/17/2023 1:11:27 PM    Cardiac MRI, 12/15/2021 Compared to prior, this study is less suggestive of LVNC. LVEF has Improved (41%)  TTE, 04/07/2021  1. No mural thrombus. Left ventricular ejection fraction, by estimation, is 40 to 45%. Left ventricular ejection fraction by 2D MOD biplane is 39.9 %. The left ventricle has mildly decreased function. The left ventricle demonstrates global hypokinesis. Left ventricular diastolic parameters  are consistent with Grade I diastolic dysfunction (impaired relaxation).   2. Right ventricular systolic function is normal. The right ventricular size is normal. Tricuspid regurgitation signal is inadequate for assessing PA pressure.   3. Left atrial size was moderately dilated.   4. The mitral valve is abnormal. Mild mitral valve regurgitation.   5. The aortic valve is tricuspid. Aortic valve regurgitation is not visualized. Aortic valve sclerosis/calcification is present, without any evidence of aortic stenosis.   6. The pulmonic valve was abnormal.    Comparison(s): Changes from prior study are noted. 01/01/2020: LVEF 30-35%.   Long term monitor, 05/01/2020 Patient had a min HR of 41 bpm, max HR of 176 bpm, and avg HR of 56 bpm. Predominant underlying rhythm was Sinus Rhythm.    34 Ventricular Tachycardia runs occurred, the run with the fastest interval lasting 4 beats with a max rate of 176 bpm, the longest lasting 7 beats with an avg rate of 112 bpm.    Isolated SVEs were rare (<1.0%), and no SVE Couplets or SVE Triplets were present. Isolated VEs were occasional (4.3%, 7840), VE Couplets were occasional (3.4%, 3091), and VE Triplets were rare (<1.0%, 191).  Ventricular Bigeminy and Trigeminy were present.     Assessment and Plan     #) VT #) PVCs PVC burden appears significantly elevated on today's EKG Sinus rate appears ~60, do not favor increasing BB at this time Update 2 week monitor to assess burden Continue 400mg  multaq  BID Continue 25mg  toprol  daily BMP, mag, CBC today   #) HFmrEF Appears warm and euvolemic on exam with good exercise tolerance GDMT - 91-103 entresto  BID, 25mg  toprol  daily, 5mg  jardiance  Consider updating TTE if symptoms recur         Current medicines are reviewed at length with the patient today.   The patient has concerns regarding his medicines.  The following changes were made today:  none  Labs/ tests ordered today include:  Orders Placed This Encounter  Procedures   Basic metabolic panel with GFR   CBC   Magnesium   LONG TERM MONITOR (3-14 DAYS)   EKG 12-Lead     Disposition: Follow up with Dr. Cindie or EP APP in 3 months, or sooner based on zio monitor results   Signed, Chantal Needle, NP  08/17/23  4:41 PM  Electrophysiology [facility]

## 2023-08-17 ENCOUNTER — Encounter: Payer: Self-pay | Admitting: Cardiology

## 2023-08-17 ENCOUNTER — Ambulatory Visit

## 2023-08-17 ENCOUNTER — Ambulatory Visit: Attending: Cardiology | Admitting: Cardiology

## 2023-08-17 ENCOUNTER — Other Ambulatory Visit
Admission: RE | Admit: 2023-08-17 | Discharge: 2023-08-17 | Disposition: A | Discharge status: Home / Self Care | Source: Ambulatory Visit | Attending: Cardiology | Admitting: Cardiology

## 2023-08-17 VITALS — BP 144/70 | HR 52 | Resp 18 | Ht 71.0 in | Wt 180.0 lb

## 2023-08-17 DIAGNOSIS — I493 Ventricular premature depolarization: Secondary | ICD-10-CM

## 2023-08-17 DIAGNOSIS — I5022 Chronic systolic (congestive) heart failure: Secondary | ICD-10-CM

## 2023-08-17 DIAGNOSIS — R002 Palpitations: Secondary | ICD-10-CM

## 2023-08-17 DIAGNOSIS — Z79899 Other long term (current) drug therapy: Secondary | ICD-10-CM

## 2023-08-17 DIAGNOSIS — I472 Ventricular tachycardia, unspecified: Secondary | ICD-10-CM

## 2023-08-17 DIAGNOSIS — I428 Other cardiomyopathies: Secondary | ICD-10-CM

## 2023-08-17 LAB — BASIC METABOLIC PANEL WITH GFR
Anion gap: 9 (ref 5–15)
BUN: 19 mg/dL (ref 8–23)
CO2: 24 mmol/L (ref 22–32)
Calcium: 9.3 mg/dL (ref 8.9–10.3)
Chloride: 106 mmol/L (ref 98–111)
Creatinine, Ser: 1.3 mg/dL — ABNORMAL HIGH (ref 0.61–1.24)
GFR, Estimated: 57 mL/min — ABNORMAL LOW (ref >60)
Glucose, Bld: 92 mg/dL (ref 70–99)
Potassium: 4.8 mmol/L (ref 3.5–5.1)
Sodium: 139 mmol/L (ref 135–145)

## 2023-08-17 LAB — CBC
HCT: 49.5 % (ref 39.0–52.0)
Hemoglobin: 16.1 g/dL (ref 13.0–17.0)
MCH: 29.7 pg (ref 26.0–34.0)
MCHC: 32.5 g/dL (ref 30.0–36.0)
MCV: 91.2 fL (ref 80.0–100.0)
Platelets: 221 K/uL (ref 150–400)
RBC: 5.43 MIL/uL (ref 4.22–5.81)
RDW: 14.5 % (ref 11.5–15.5)
WBC: 7.3 K/uL (ref 4.0–10.5)
nRBC: 0 % (ref 0.0–0.2)

## 2023-08-17 MED ORDER — EMPAGLIFLOZIN 10 MG PO TABS: ORAL_TABLET | ORAL | Status: AC

## 2023-08-17 NOTE — Patient Instructions (Signed)
 Medication Instructions:  Your physician recommends that you continue on your current medications as directed. Please refer to the Current Medication list given to you today.   *If you need a refill on your cardiac medications before your next appointment, please call your pharmacy*  Lab Work: Your provider would like for you to have following labs drawn today BMet, CBC, Mag.   If you have labs (blood work) drawn today and your tests are completely normal, you will receive your results only by: MyChart Message (if you have MyChart) OR A paper copy in the mail If you have any lab test that is abnormal or we need to change your treatment, we will call you to review the results.  Testing/Procedures: Your physician has recommended that you wear a Zio monitor.   This monitor is a medical device that records the heart's electrical activity. Doctors most often use these monitors to diagnose arrhythmias. Arrhythmias are problems with the speed or rhythm of the heartbeat. The monitor is a small device applied to your chest. You can wear one while you do your normal daily activities. While wearing this monitor if you have any symptoms to push the button and record what you felt. Once you have worn this monitor for the period of time provider prescribed (Usually 14 days), you will return the monitor device in the postage paid box. Once it is returned they will download the data collected and provide us  with a report which the provider will then review and we will call you with those results. Important tips:  Avoid showering during the first 24 hours of wearing the monitor. Avoid excessive sweating to help maximize wear time. Do not submerge the device, no hot tubs, and no swimming pools. Keep any lotions or oils away from the patch. After 24 hours you may shower with the patch on. Take brief showers with your back facing the shower head.  Do not remove patch once it has been placed because that will  interrupt data and decrease adhesive wear time. Push the button when you have any symptoms and write down what you were feeling. Once you have completed wearing your monitor, remove and place into box which has postage paid and place in your outgoing mailbox.  If for some reason you have misplaced your box then call our office and we can provide another box and/or mail it off for you.   Follow-Up: At [facility], you and your health needs are our priority.  As part of our continuing mission to provide you with exceptional heart care, our providers are all part of one team.  This team includes your primary Cardiologist (physician) and Advanced Practice Providers or APPs (Physician Assistants and Nurse Practitioners) who all work together to provide you with the care you need, when you need it.  Your next appointment:   3 month(s)  Provider:   Ole Holts, MD or Suzann Riddle, NP    We recommend signing up for the patient portal called MyChart.  Sign up information is provided on this After Visit Summary.  MyChart is used to connect with patients for Virtual Visits (Telemedicine).  Patients are able to view lab/test results, encounter notes, upcoming appointments, etc.  Non-urgent messages can be sent to your provider as well.   To learn more about what you can do with MyChart, go to [email or web address].

## 2023-08-19 ENCOUNTER — Ambulatory Visit: Payer: Self-pay | Admitting: Cardiology

## 2023-08-31 ENCOUNTER — Ambulatory Visit

## 2023-09-14 DIAGNOSIS — R0602 Shortness of breath: Secondary | ICD-10-CM | POA: Diagnosis not present

## 2023-09-14 DIAGNOSIS — I420 Dilated cardiomyopathy: Secondary | ICD-10-CM | POA: Diagnosis not present

## 2023-09-14 DIAGNOSIS — R001 Bradycardia, unspecified: Secondary | ICD-10-CM | POA: Diagnosis not present

## 2023-09-14 DIAGNOSIS — I493 Ventricular premature depolarization: Secondary | ICD-10-CM | POA: Diagnosis not present

## 2023-09-14 DIAGNOSIS — I472 Ventricular tachycardia, unspecified: Secondary | ICD-10-CM | POA: Diagnosis not present

## 2023-09-24 ENCOUNTER — Other Ambulatory Visit: Payer: Self-pay | Admitting: Internal Medicine

## 2023-09-28 DIAGNOSIS — E559 Vitamin D deficiency, unspecified: Secondary | ICD-10-CM | POA: Diagnosis not present

## 2023-09-28 DIAGNOSIS — I5020 Unspecified systolic (congestive) heart failure: Secondary | ICD-10-CM | POA: Diagnosis not present

## 2023-10-04 ENCOUNTER — Other Ambulatory Visit: Payer: Self-pay

## 2023-10-06 MED ORDER — SACUBITRIL-VALSARTAN 97-103 MG PO TABS: 1.0000 | ORAL_TABLET | Freq: Two times a day (BID) | ORAL | 3 refills | Status: AC

## 2023-10-31 ENCOUNTER — Encounter: Payer: Self-pay | Admitting: Cardiology

## 2023-11-17 ENCOUNTER — Ambulatory Visit: Admitting: Cardiology
